# Patient Record
Sex: Male | Born: 1965
Health system: Southern US, Community
[De-identification: ages and names within clinical notes are randomized; demographics above are authoritative.]

## PROBLEM LIST (undated history)

## (undated) DIAGNOSIS — E785 Hyperlipidemia, unspecified: Secondary | ICD-10-CM

## (undated) DIAGNOSIS — Z803 Family history of malignant neoplasm of breast: Secondary | ICD-10-CM

## (undated) DIAGNOSIS — K219 Gastro-esophageal reflux disease without esophagitis: Secondary | ICD-10-CM

## (undated) DIAGNOSIS — E119 Type 2 diabetes mellitus without complications: Principal | ICD-10-CM

## (undated) DIAGNOSIS — I2510 Atherosclerotic heart disease of native coronary artery without angina pectoris: Secondary | ICD-10-CM

## (undated) DIAGNOSIS — C711 Malignant neoplasm of frontal lobe: Principal | ICD-10-CM

## (undated) DIAGNOSIS — I249 Acute ischemic heart disease, unspecified: Secondary | ICD-10-CM

## (undated) DIAGNOSIS — I4729 Other ventricular tachycardia: Secondary | ICD-10-CM

## (undated) DIAGNOSIS — I472 Ventricular tachycardia: Secondary | ICD-10-CM

## (undated) DIAGNOSIS — R0602 Shortness of breath: Secondary | ICD-10-CM

## (undated) DIAGNOSIS — R569 Unspecified convulsions: Secondary | ICD-10-CM

## (undated) HISTORY — DX: Other ventricular tachycardia: I47.29

## (undated) HISTORY — PX: CARDIAC CATHETERIZATION: SHX172

## (undated) HISTORY — DX: Ventricular tachycardia: I47.2

## (undated) HISTORY — DX: Acute ischemic heart disease, unspecified: I24.9

## (undated) HISTORY — DX: Hyperlipidemia, unspecified: E78.5

## (undated) HISTORY — PX: CORONARY ANGIOPLASTY WITH STENT PLACEMENT: SHX49

## (undated) HISTORY — DX: Malignant neoplasm of frontal lobe: C71.1

## (undated) HISTORY — PX: APPENDECTOMY: SHX54

## (undated) HISTORY — DX: Family history of malignant neoplasm of breast: Z80.3

## (undated) HISTORY — DX: Atherosclerotic heart disease of native coronary artery without angina pectoris: I25.10

## (undated) HISTORY — DX: Gastro-esophageal reflux disease without esophagitis: K21.9

## (undated) HISTORY — DX: Type 2 diabetes mellitus without complications: E11.9

---

## 2003-03-29 ENCOUNTER — Emergency Department (HOSPITAL_COMMUNITY): Admission: EM | Admit: 2003-03-29 | Discharge: 2003-03-29 | Payer: Self-pay | Admitting: Emergency Medicine

## 2005-09-06 ENCOUNTER — Emergency Department (HOSPITAL_COMMUNITY): Admission: EM | Admit: 2005-09-06 | Discharge: 2005-09-06 | Payer: Self-pay | Admitting: Emergency Medicine

## 2005-09-21 ENCOUNTER — Encounter: Admission: RE | Admit: 2005-09-21 | Discharge: 2005-09-21 | Payer: Self-pay | Admitting: Surgery

## 2006-07-13 ENCOUNTER — Ambulatory Visit (HOSPITAL_COMMUNITY): Admission: RE | Admit: 2006-07-13 | Discharge: 2006-07-13 | Payer: Self-pay | Admitting: Surgery

## 2011-07-18 ENCOUNTER — Emergency Department (HOSPITAL_COMMUNITY): Payer: Self-pay

## 2011-07-18 ENCOUNTER — Other Ambulatory Visit: Payer: Self-pay

## 2011-07-18 ENCOUNTER — Observation Stay (HOSPITAL_COMMUNITY)
Admission: EM | Admit: 2011-07-18 | Discharge: 2011-07-19 | Discharge status: Against Medical Advice | Payer: Self-pay | Source: Ambulatory Visit | Attending: Internal Medicine | Admitting: Internal Medicine

## 2011-07-18 ENCOUNTER — Encounter (HOSPITAL_COMMUNITY): Payer: Self-pay | Admitting: Emergency Medicine

## 2011-07-18 DIAGNOSIS — R079 Chest pain, unspecified: Principal | ICD-10-CM | POA: Insufficient documentation

## 2011-07-18 DIAGNOSIS — R0602 Shortness of breath: Secondary | ICD-10-CM | POA: Insufficient documentation

## 2011-07-18 DIAGNOSIS — F17200 Nicotine dependence, unspecified, uncomplicated: Secondary | ICD-10-CM | POA: Insufficient documentation

## 2011-07-18 DIAGNOSIS — E119 Type 2 diabetes mellitus without complications: Secondary | ICD-10-CM

## 2011-07-18 DIAGNOSIS — R109 Unspecified abdominal pain: Secondary | ICD-10-CM | POA: Insufficient documentation

## 2011-07-18 DIAGNOSIS — R739 Hyperglycemia, unspecified: Secondary | ICD-10-CM

## 2011-07-18 DIAGNOSIS — R1013 Epigastric pain: Secondary | ICD-10-CM

## 2011-07-18 HISTORY — DX: Shortness of breath: R06.02

## 2011-07-18 LAB — DIFFERENTIAL
Lymphocytes Relative: 30 % (ref 12–46)
Monocytes Relative: 8 % (ref 3–12)

## 2011-07-18 LAB — GLUCOSE, CAPILLARY: Glucose-Capillary: 276 mg/dL — ABNORMAL HIGH (ref 70–99)

## 2011-07-18 LAB — COMPREHENSIVE METABOLIC PANEL
ALT: 35 U/L (ref 0–53)
AST: 23 U/L (ref 0–37)
Alkaline Phosphatase: 111 U/L (ref 39–117)
Calcium: 9.1 mg/dL (ref 8.4–10.5)
GFR calc Af Amer: >90 mL/min (ref >90)
GFR calc non Af Amer: >90 mL/min (ref >90)
Glucose, Bld: 373 mg/dL — ABNORMAL HIGH (ref 70–99)
Total Bilirubin: 0.3 mg/dL (ref 0.3–1.2)
Total Protein: 7.4 g/dL (ref 6.0–8.3)

## 2011-07-18 LAB — CBC
MCH: 30.7 pg (ref 26.0–34.0)
MCHC: 35.4 g/dL (ref 30.0–36.0)
RBC: 5.6 MIL/uL (ref 4.22–5.81)

## 2011-07-18 LAB — LIPID PANEL
HDL: 22 mg/dL — ABNORMAL LOW (ref >39)
LDL Cholesterol: 107 mg/dL — ABNORMAL HIGH (ref 0–99)
Total CHOL/HDL Ratio: 8.8 RATIO
Triglycerides: 322 mg/dL — ABNORMAL HIGH (ref <150)
VLDL: 64 mg/dL — ABNORMAL HIGH (ref 0–40)

## 2011-07-18 LAB — URINALYSIS, ROUTINE W REFLEX MICROSCOPIC
Ketones, ur: NEGATIVE mg/dL
Nitrite: NEGATIVE
pH: 5.5 (ref 5.0–8.0)

## 2011-07-18 LAB — TROPONIN I: Troponin I: <0.3 ng/mL (ref <0.30)

## 2011-07-18 LAB — CARDIAC PANEL(CRET KIN+CKTOT+MB+TROPI)
CK, MB: 1.4 ng/mL (ref 0.3–4.0)
CK, MB: 1.6 ng/mL (ref 0.3–4.0)
Total CK: 67 U/L (ref 7–232)
Troponin I: <0.3 ng/mL (ref <0.30)

## 2011-07-18 LAB — URINE MICROSCOPIC-ADD ON

## 2011-07-18 LAB — LIPASE, BLOOD: Lipase: 51 U/L (ref 11–59)

## 2011-07-18 MED ORDER — SODIUM CHLORIDE 0.9 % IV BOLUS (SEPSIS)
1000.0000 mL | Freq: Once | INTRAVENOUS | Status: AC
  Administered 2011-07-18: 1000 mL via INTRAVENOUS

## 2011-07-18 MED ORDER — SODIUM CHLORIDE 0.9 % IV SOLN
INTRAVENOUS | Status: DC
  Administered 2011-07-18: 12:00:00 via INTRAVENOUS

## 2011-07-18 MED ORDER — PANTOPRAZOLE SODIUM 40 MG PO TBEC
40.0000 mg | DELAYED_RELEASE_TABLET | Freq: Every day | ORAL | Status: DC
  Administered 2011-07-18: 40 mg via ORAL
  Filled 2011-07-18: qty 1

## 2011-07-18 MED ORDER — ACETAMINOPHEN 650 MG RE SUPP: 650.0000 mg | Freq: Four times a day (QID) | RECTAL | Status: DC | PRN

## 2011-07-18 MED ORDER — ACETAMINOPHEN 325 MG PO TABS: 650.0000 mg | ORAL_TABLET | Freq: Four times a day (QID) | ORAL | Status: DC | PRN

## 2011-07-18 MED ORDER — ONDANSETRON HCL 4 MG/2ML IJ SOLN: 4.0000 mg | Freq: Four times a day (QID) | INTRAMUSCULAR | Status: DC | PRN

## 2011-07-18 MED ORDER — ENOXAPARIN SODIUM 40 MG/0.4ML ~~LOC~~ SOLN
40.0000 mg | SUBCUTANEOUS | Status: DC
  Administered 2011-07-18: 40 mg via SUBCUTANEOUS
  Filled 2011-07-18 (×2): qty 0.4

## 2011-07-18 MED ORDER — INSULIN ASPART 100 UNIT/ML ~~LOC~~ SOLN
0.0000 [IU] | Freq: Three times a day (TID) | SUBCUTANEOUS | Status: DC
  Administered 2011-07-18: 5 [IU] via SUBCUTANEOUS
  Administered 2011-07-19: 2 [IU] via SUBCUTANEOUS

## 2011-07-18 MED ORDER — ONDANSETRON HCL 4 MG PO TABS: 4.0000 mg | ORAL_TABLET | Freq: Four times a day (QID) | ORAL | Status: DC | PRN

## 2011-07-18 MED ORDER — GI COCKTAIL ~~LOC~~
30.0000 mL | Freq: Two times a day (BID) | ORAL | Status: DC | PRN
  Filled 2011-07-18: qty 30

## 2011-07-18 MED ORDER — INSULIN GLARGINE 100 UNIT/ML ~~LOC~~ SOLN
10.0000 [IU] | Freq: Every day | SUBCUTANEOUS | Status: DC
  Administered 2011-07-18: 10 [IU] via SUBCUTANEOUS

## 2011-07-18 MED ORDER — INSULIN ASPART 100 UNIT/ML ~~LOC~~ SOLN
0.0000 [IU] | Freq: Every day | SUBCUTANEOUS | Status: DC
  Administered 2011-07-18: 2 [IU] via SUBCUTANEOUS

## 2011-07-18 NOTE — ED Notes (Signed)
Pt. Stated, I've had chest pain for 2 days and my arm was tingling

## 2011-07-18 NOTE — ED Provider Notes (Signed)
History     CSN: [id number]  Arrival date & time 07/18/11  4098   First MD Initiated Contact with Patient 07/18/11 0802      Chief Complaint  Patient presents with  . Chest Pain    (Consider location/radiation/quality/duration/timing/severity/associated sxs/prior treatment) Patient is a 46 y.o. male presenting with chest pain. The history is provided by the patient.  Chest Pain Primary symptoms include abdominal pain. Pertinent negatives for primary symptoms include no shortness of breath, no nausea and no vomiting.  Pertinent negatives for associated symptoms include no numbness and no weakness.    patient states that he has had chest pain for couple days now. It is in his left lower chest. He states is worse when he lays over on that side. He also states his left arm will tingle. He smokes about a pack and half of cigarettes a day. He does not see doctors normally. He also states she's been urinating frequently for the last few months. He states he checked his sugar on a friend's meter and it was 400. He does not have a history of diabetes. He states he is thirsty all the time. No cough. No fevers. He has occasional epigastric pain. He does not have any chest pain at this time  History reviewed. No pertinent past medical history.  Past Surgical History  Procedure Date  . Appendectomy     History reviewed. No pertinent family history.  History  Substance Use Topics  . Smoking status: Current Everyday Smoker -- 1.5 packs/day  . Smokeless tobacco: Not on file  . Alcohol Use: No      Review of Systems  Constitutional: Negative for activity change and appetite change.  HENT: Negative for neck stiffness.   Eyes: Negative for pain.  Respiratory: Negative for chest tightness and shortness of breath.   Cardiovascular: Positive for chest pain. Negative for leg swelling.  Gastrointestinal: Positive for abdominal pain. Negative for nausea, vomiting and diarrhea.  Genitourinary:  Negative for flank pain.  Musculoskeletal: Negative for back pain.  Skin: Negative for rash.  Neurological: Negative for weakness, numbness and headaches.  Psychiatric/Behavioral: Negative for behavioral problems.    Allergies  Review of patient's allergies indicates not on file.  Home Medications  No current outpatient prescriptions on file.  BP 132/85  Pulse 75  Temp(Src) 97.5 F (36.4 C) (Oral)  Resp 16  Ht 5\' 11"  (1.803 m)  Wt 196 lb 14.4 oz (89.313 kg)  BMI 27.46 kg/m2  SpO2 98%  Physical Exam  Nursing note and vitals reviewed. Constitutional: He is oriented to person, place, and time. He appears well-developed and well-nourished.  HENT:  Head: Normocephalic and atraumatic.  Eyes: EOM are normal. Pupils are equal, round, and reactive to light.  Neck: Normal range of motion. Neck supple.  Cardiovascular: Normal rate, regular rhythm and normal heart sounds.   No murmur heard. Pulmonary/Chest: Effort normal and breath sounds normal.  Abdominal: Soft. Bowel sounds are normal. He exhibits no distension and no mass. There is no tenderness. There is no rebound and no guarding.  Musculoskeletal: Normal range of motion. He exhibits no edema.  Neurological: He is alert and oriented to person, place, and time. No cranial nerve deficit.  Skin: Skin is warm and dry.  Psychiatric: He has a normal mood and affect.    ED Course  Procedures (including critical care time)  Labs Reviewed  GLUCOSE, CAPILLARY - Abnormal; Notable for the following:    Glucose-Capillary 402 (*)  All other components within normal limits  CBC - Abnormal; Notable for the following:    Hemoglobin 17.2 (*)    All other components within normal limits  COMPREHENSIVE METABOLIC PANEL - Abnormal; Notable for the following:    Sodium 130 (*)    Glucose, Bld 373 (*)    All other components within normal limits  URINALYSIS, ROUTINE W REFLEX MICROSCOPIC - Abnormal; Notable for the following:    Specific  Gravity, Urine 1.040 (*)    Glucose, UA >1000 (*)    All other components within normal limits  GLUCOSE, CAPILLARY - Abnormal; Notable for the following:    Glucose-Capillary 276 (*)    All other components within normal limits  LIPID PANEL - Abnormal; Notable for the following:    Triglycerides 322 (*)    HDL 22 (*)    VLDL 64 (*)    LDL Cholesterol 107 (*)    All other components within normal limits  DIFFERENTIAL  LIPASE, BLOOD  TROPONIN I  URINE MICROSCOPIC-ADD ON  CARDIAC PANEL(CRET KIN+CKTOT+MB+TROPI)  D-DIMER, QUANTITATIVE  CARDIAC PANEL(CRET KIN+CKTOT+MB+TROPI)  TSH  HEMOGLOBIN A1C  MICROALBUMIN / CREATININE URINE RATIO  CARDIAC PANEL(CRET KIN+CKTOT+MB+TROPI)   Dg Chest 2 View  07/18/2011  *RADIOLOGY REPORT*  Clinical Data: Left chest pain, shortness of breath, hyperglycemia  CHEST - 2 VIEW  Comparison:  None.  Findings:  The heart size and mediastinal contours are within normal limits.  Both lungs are clear.  The visualized skeletal structures are unremarkable.  IMPRESSION: No active cardiopulmonary disease.  Original Report Authenticated By: Judie Petit. Ruel Favors, M.D.     1. Hyperglycemia   2. Chest pain     Date: 07/18/2011  Rate: 81  Rhythm: normal sinus rhythm  QRS Axis: normal  Intervals: normal  ST/T Wave abnormalities: normal  Conduction Disutrbances:none  Narrative Interpretation:   Old EKG Reviewed: none available     MDM  Patient has chest pain that comes and goes. EKG and laboratory reassuring for that. He also is a new apparent diabetes. A sugar is 400, but he does not appear to be in DKA. He does not have followup will be admitted to medicine.        Juliet Rude. Rubin Payor, MD 07/18/11 1505

## 2011-07-18 NOTE — H&P (Signed)
Hospital Admission Note Date: 07/18/2011  Patient name: Jonathan Barker Medical record number: [id number] Date of birth: November 13, 1965 Age: 46 y.o. Gender: male PCP: DEFAULT,PROVIDER, MD, MD  Medical Service:  Mervyn Gay  Attending physician:  Dr. Doneen Poisson   1st Contact:   Dr. Yaakov Guthrie  Pager:  [phone] 2nd Contact:   Dr. 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  The pain is in his central lower chest and is also over his epigastric area/left upper quadrant.  It is "a heaviness".  After he eats the pain is worse and has more of a pressure quality.  He had some left arm tingling today as well.  No nausea or vomiting.  No constipation or diarrhea.  No history of heartburn. He has noticed muscle wasting over the past 3 months.  He believes he is losing weight, but he has not weighed himself to know.  He has been notably fatigued for last couple months as well. He also reports wheezing/SOB when he lays on his left side.  He did have some palpitations yesterday.  His glucose was found to be 402 in the emergency department.  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  He has extensive family history of diabetes.  He has had polyuria for three months.  No numbness or tingling anywhere currently.  No headache.  No dysuria or hematuria.  No rashes.  Some lower leg cramping bilaterally that improves with moving feet and toes.  No erythema or swelling of legs.  No SOB except when laying on left side.     Meds: Medication List  As of 07/18/2011 11:20 AM   ASK your doctor about these medications        ibuprofen 200 MG tablet   Commonly known as: ADVIL,MOTRIN   Take 400 mg by mouth every 6 (six) hours as needed. For pain          Rarely takes ibuprofen, but took it 5 days straight a couple weeks ago for toothache  Allergies: Review of patient's allergies indicates not on file.  Past Medical History: None reported by patient  Past Surgical History  Procedure Date  . Appendectomy   Inguinal hernia surgeries bilaterally, likely umbilical hernia surgery as well  Family History: Father had MI and CABG between ages 74 and 84. Father, brother, uncles all have diabetes. Mother had cancer in her 83s.  History   Social History  . Marital Status: Significant Other    Spouse Name: N/A    Number of Children: N/A  . Years of Education: N/A   Occupational History  . Not on file.   Social History Main Topics  . Smoking status: Current Everyday Smoker -- 1.5 packs/day  . Smokeless tobacco: Not on file  . Alcohol Use: No  . Drug Use: Yes    Special: Marijuana  . Sexually Active:   Has smoked for 30 years Daily marijuana use Currently is a truck driver.  Review of Systems: See HPI  Physical Exam: Blood pressure 116/83, pulse 69,  temperature 97.6 F (36.4 C), temperature source Oral, resp. rate 14, SpO2 99.00%.  General: alert, well-developed, and cooperative to examination.  Head: normocephalic and atraumatic.  Eyes: vision grossly intact, pupils equal, pupils round, pupils reactive to light, no injection and anicteric.  Mouth: pharynx pink and moist, no erythema, and no exudates.  Neck: no JVD, and no carotid bruits.  Lungs: normal respiratory effort, no accessory muscle use, normal breath sounds, no crackles, and no wheezes. Heart: normal rate, regular rhythm, no murmur, no gallop, and no rub.  No chest wall tenderness  Abdomen: Tender to palpation in epigastric area and LUQ.  Soft, normal bowel sounds, no distention, no guarding, no rebound tenderness.  Msk: no  joint swelling, no joint warmth, and no redness over joints.  Pulses: 2+ DP/PT pulses bilaterally Extremities: No cyanosis, clubbing, edema.  No erythema, swelling, or tenderness of lower extremities.   Neurologic: alert & oriented X3, cranial nerves II-XII intact, strength normal in all extremities, sensation intact to light touch. Skin: turgor normal and no rashes.    Lab results: Basic Metabolic Panel:  Basename 07/18/11 0816  NA 130*  K 4.2  CL 98  CO2 21  GLUCOSE 373*  BUN 14  CREATININE 0.67  CALCIUM 9.1  MG --  PHOS --   Liver Function Tests:  [facility] 07/18/11 0816  AST 23  ALT 35  ALKPHOS 111  BILITOT 0.3  PROT 7.4  ALBUMIN 3.7    Basename 07/18/11 0816  LIPASE 51  AMYLASE --   CBC:  Basename 07/18/11 0816  WBC 10.0  NEUTROABS 5.8  HGB 17.2*  HCT 48.6  MCV 86.8  PLT 238   Cardiac Enzymes:  Basename 07/18/11 0816  CKTOTAL --  CKMB --  CKMBINDEX --  TROPONINI <0.30   CBG:  Basename 07/18/11 1009 07/18/11 0802  GLUCAP 276* 402*   Urinalysis:  Basename 07/18/11 0819  COLORURINE YELLOW  LABSPEC 1.040*  PHURINE 5.5  GLUCOSEU >1000*  HGBUR NEGATIVE  BILIRUBINUR NEGATIVE  KETONESUR NEGATIVE  PROTEINUR NEGATIVE  UROBILINOGEN 0.2  NITRITE NEGATIVE  LEUKOCYTESUR NEGATIVE    Imaging results:  Dg Chest 2 View  07/18/2011  *RADIOLOGY REPORT*  Clinical Data: Left chest pain, shortness of breath, hyperglycemia  CHEST - 2 VIEW  Comparison:  None.  Findings:  The heart size and mediastinal contours are within normal limits.  Both lungs are clear.  The visualized skeletal structures are unremarkable.  IMPRESSION: No active cardiopulmonary disease.  Original Report Authenticated By: Judie Petit. Ruel Favors, M.D.    Other results: EKG: Regular rate, normal sinus rhythm.  Normal axis.  Normal EKG.  No priors available.  Assessment & Plan by Problem:  Epigastric/Chest Pain: Patient presents with epigastric/lower chest pain for two days.  Resolved at  this time.  Worse after eating, and some wheezing/SOB laying on left side.  GERD and PUD are highest on the differential.  Given his possible recent weight loss, more serious GI etiologies/further workup may need to be considered if he has no response to PPI and GI cocktail if his pain recurs.  Given normal EKG, troponin negative x 1, and pain being located more epigastrically, ACS unlikely but will admit for ACS rule out since he has diabetes and has not seen a physician in a long time.  PE is a less likely possibility as he has no tachycardia or tachypnea.  However, he did have palpitations yesterday, he drives trucks long distances, and his mother had a blood clot, so will  check D-Dimer for this low risk patient.  Modified Geneva Score 3, low risk.    -Cardiac Enzymes x 3 sets -D dimer to rule out PE in this low risk patient.  Modified Geneva Score  -Lipid Panel -Protonix -GI cocktail PRN if chest pain recurs  Diabetes Mellitus: CBG 402 on arrival to ED with polyuria for past few months.  Extensive family history of diabetes.  New diagnosis of diabetes.   -Will check HbA1c, Lipid Panel, urine microalbumin/creatinine -Carb modified diet -SSI sensitive  Muscle Wasting and Fatigue: For past 2-3 months.  This may all be due to his likely uncontrolled diabetes mellitus.   He is a long term smoker, but no cough and CXRay is clear.  Hgb is 17, so not anemic.  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   -Work on improving diet, treat his diabetes, and observe for now.    -Will check TSH as well  DVT Prophylaxis: Lovenox   SignedYaakov Guthrie, BRAD 07/18/2011, 11:16 AM

## 2011-07-18 NOTE — ED Notes (Signed)
Lunch tray ordered 

## 2011-07-18 NOTE — ED Notes (Signed)
CBG 402 

## 2011-07-19 ENCOUNTER — Encounter (HOSPITAL_COMMUNITY): Payer: Self-pay | Admitting: *Deleted

## 2011-07-19 LAB — CARDIAC PANEL(CRET KIN+CKTOT+MB+TROPI)
CK, MB: 1.6 ng/mL (ref 0.3–4.0)
Relative Index: INVALID (ref 0.0–2.5)
Total CK: 78 U/L (ref 7–232)
Troponin I: <0.3 ng/mL (ref <0.30)

## 2011-07-19 LAB — MICROALBUMIN / CREATININE URINE RATIO
Creatinine, Urine: 78.2 mg/dL
Microalb Creat Ratio: 6.4 mg/g (ref 0.0–30.0)

## 2011-07-19 LAB — BASIC METABOLIC PANEL
BUN: 10 mg/dL (ref 6–23)
CO2: 22 mEq/L (ref 19–32)
Calcium: 8.6 mg/dL (ref 8.4–10.5)
GFR calc non Af Amer: >90 mL/min (ref >90)
Glucose, Bld: 205 mg/dL — ABNORMAL HIGH (ref 70–99)

## 2011-07-19 MED ORDER — PRAVASTATIN SODIUM 40 MG PO TABS: 40.0000 mg | ORAL_TABLET | Freq: Every day | ORAL | Status: AC

## 2011-07-19 MED ORDER — ASPIRIN EC 81 MG PO TBEC: 81.0000 mg | DELAYED_RELEASE_TABLET | Freq: Every day | ORAL | Status: AC

## 2011-07-19 MED ORDER — LIVING WELL WITH DIABETES BOOK
Freq: Once | Status: DC
  Filled 2011-07-19: qty 1

## 2011-07-19 MED ORDER — OMEPRAZOLE 20 MG PO CPDR: 20.0000 mg | DELAYED_RELEASE_CAPSULE | Freq: Every day | ORAL | Status: DC

## 2011-07-19 MED ORDER — METFORMIN HCL 500 MG PO TABS: ORAL_TABLET | ORAL | Status: DC

## 2011-07-19 MED ORDER — ACETAMINOPHEN 325 MG PO TABS: 650.0000 mg | ORAL_TABLET | Freq: Four times a day (QID) | ORAL | Status: DC | PRN

## 2011-07-19 NOTE — Progress Notes (Signed)
Utilization review complete 

## 2011-07-19 NOTE — Progress Notes (Signed)
Received referral for assistance with medications. Will follow for progression and any d/c needs. Pt would be eligible for some medication assistance if needed, will talk with pt . Johny Shock RN MPH Case Manager [phone]

## 2011-07-19 NOTE — H&P (Signed)
Internal Medicine Attending Admission Note Date: 07/19/2011  Patient name: Jonathan Barker Medical record number: [id number] Date of birth: 1965-12-19 Age: 46 y.o. Gender: male  I saw and evaluated the patient. I reviewed the resident's note and I agree with the resident's findings and plan as documented in the resident's note.  Chief Complaint(s):  Chest pain.  History - key components related to admission:  Mr. Diamant is a 46 year old man who presents with a two-day history of central chest heaviness that radiates to the left lower ribs and left upper quadrant of the abdomen. The heaviness is not associated with shortness of breath, nausea, diaphoresis, or dizziness. There was some associated left arm tingling. The pain was worse with eating but there were no alleviating factors. He denied any fevers, shakes, chills, vomiting, or diarrhea. When he laid on his left side he noticed some wheezing. This pain concerned him so he presented to the emergency department for further evaluation.  Cardiac risk factors include tobacco abuse, diabetes, hyperlipidemia, and premature coronary artery disease.  Approximately 9 months ago Mr. 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 During that exam he was noted to have protein in his urine and a blood sugar of 180. Nothing further was done at that time. Over the last 3 months he has noted polyuria and polydipsia. His friends have glucometers and he has been periodically checking his blood sugars over the last 2-3 days and they have been in the 200-400 range. He has an extensive family history of diabetes.  He currently feels well and denies any chest pain.  Physical Exam - key components related to admission:  Filed Vitals:   07/18/11 1307 07/18/11 1430 07/18/11 2100 07/19/11 0600  BP: 120/81 132/85 127/81 119/76  Pulse: 71 75 74 83  Temp:  97.5 F (36.4 C) 97.8 F (36.6 C) 97.4 F (36.3 C)  TempSrc:  Oral    Resp: 15 16 18 18     Height:  5\' 11"  (1.803 m)    Weight:  196 lb 14.4 oz (89.313 kg)    SpO2: 100% 98% 100% 97%   General: Well-developed, well-nourished, man lying comfortably in bed in no acute distress. Lungs: Clear to auscultation bilaterally without wheezes, rhonchi, or rales. Heart: Regular rate and rhythm without murmurs, rubs, or gallops. Abdomen: Soft, non tender, active bowel sounds without masses. Extremities: Without edema.  Lab results:  Basic Metabolic Panel:  Basename 07/19/11 0550 07/18/11 0816  NA 136 130*  K 4.0 4.2  CL 106 98  CO2 22 21  GLUCOSE 205* 373*  BUN 10 14  CREATININE 0.65 0.67  CALCIUM 8.6 9.1  MG -- --  PHOS -- --   Liver Function Tests:  Basename 07/18/11 0816  AST 23  ALT 35  ALKPHOS 111  BILITOT 0.3  PROT 7.4  ALBUMIN 3.7    Basename 07/18/11 0816  LIPASE 51  AMYLASE --   CBC:  Basename 07/18/11 0816  WBC 10.0  NEUTROABS 5.8  HGB 17.2*  HCT 48.6  MCV 86.8  PLT 238   Cardiac Enzymes:  Basename 07/19/11 0239 07/18/11 2053 07/18/11 1340  CKTOTAL 78 87 67  CKMB 1.6 1.6 1.4  CKMBINDEX -- -- --  TROPONINI <0.30 <0.30 <0.30   D-Dimer:  Basename 07/18/11 1340  DDIMER <0.22   CBG:  Basename 07/19/11 0741 07/18/11 2133 07/18/11 1504 07/18/11 1009 07/18/11 0802  GLUCAP 185* 207* 257* 276* 402*   Hemoglobin A1C:  Basename 07/18/11  1542  HGBA1C 12.4*   Fasting Lipid Panel:  Basename 07/18/11 1340  CHOL 193  HDL 22*  LDLCALC 107*  TRIG 322*  CHOLHDL 8.8  LDLDIRECT --   Thyroid Function Tests:  Basename 07/18/11 1340  TSH 1.073  T4TOTAL --  FREET4 --  T3FREE --  THYROIDAB --   Urinalysis:  Negative except for greater than 1000 glucose.  Imaging results:  Dg Chest 2 View  07/18/2011  *RADIOLOGY REPORT*  Clinical Data: Left chest pain, shortness of breath, hyperglycemia  CHEST - 2 VIEW  Comparison:  None.  Findings:  The heart size and mediastinal contours are within normal limits.  Both lungs are clear.  The visualized  skeletal structures are unremarkable.  IMPRESSION: No active cardiopulmonary disease.  Original Report Authenticated By: Judie Petit. Ruel Favors, M.D.   Other results:  EKG: normal sinus rhythm at 81 beats per minute. Normal axis and intervals, no significant Q waves or LVH. Good R-wave progression, no ST or T wave changes. No comparisons available.  Assessment & Plan by Problem:  Mr. Pardini is a 46 year old man who presents with atypical chest pain radiating to the left upper quadrant and epigastrium. The differential is broad and includes gastroesophageal reflux disease and coronary artery disease. Given the atypical nature of his symptoms we are currently leaning towards a GI cause. Nonetheless, he has extensive risk factors for coronary artery disease and this must be In mind. He has ruled out for myocardial infarction during this admission.  He also has newly diagnosed diabetes. This has likely been present for at least 9 months if not longer. His diet is very poor and includes over 2 L of [facility] as well as a quarter gallon of ice cream every day.  1) Chest pain: We will place Mr. Stalvey on a PPI with the presumption that this pain is related to reflux disease or peptic ulcer disease given its association and worsening with eating. If the pain recurs in the future we will then consider further evaluation for cardiac disease with an exercise EKG to risk stratify. Given his multiple risk factors we will start aspirin 81 mg by mouth daily.  2) Diabetes: Mr. Bissonette was counseled on the need to stop the [facility] and ice cream. We felt this would be a good start for him and something that he could achieve with minimal difficulty. With these 2 factors eliminated we anticipate his glucose control will be much better. In addition, we will start metformin at a low dose of 500 mg daily. We will titrate this dose up by 500 mg every week until he reaches 2 g per day. It is hoped that with a slow  titration he will tolerate it well from a GI standpoint. Further adjustments in his regimen can be made at that time after we assess the effects of the metformin and the dietary changes. While he is an inpatient we will get diabetes education to him prior to his discharge.  3) Disposition: We will plug [facility] primary care and he will be seen within one week to assess the effects of the PPI on his pain as well as the dietary changes and low-dose metformin on his blood sugars.  We would anticipate continued suboptimal control initially until he is able to titrate up the metformin. I agree with the housestaff's plan to discharge Mr. Petkus home today with the above follow up.

## 2011-07-19 NOTE — Progress Notes (Signed)
Patient admitted with CP.  Diagnosed with diabetes this admission.  A1C 12.4% (07/18/11).  Patient does not have health insurance coverage and is currently not working.  Received call from Resident 0930 am asking me to please come speak to patient about his new onset diabetes.  Resident told me they will be sending patient home on Metformin and diet therapy alone.  Patient is to follow up with the Internal Medicine Clinic at [facility] after d/c.  Resident to make sure patient follows up with Gavin Pound (Certified Diabetes Educator) in the clinic after d/c as well.  Spoke with pt about new diagnosis.  Discussed A1C results with him and explained what an A1C is, basic pathophysiology of DM Type 2, basic home care, importance of checking CBGs and maintaining good CBG control to prevent long-term and short-term complications.  Reviewed signs and symptoms of hyperglycemia and hypoglycemia.  Also reviewed Metformin therapy with this patient and provided basic information on carbohydrate counting and the importance of limiting his ice cream consumption to one serving and the importance of excluding sweetened drinks (like [facility] Dew/sweet tea/Regular soda/juice).  Patient told me he plans to stop drinking [facility] completely.  Educated patient on where to get an affordable CBG meter OTC at [employer].  Meter can be purchased for $16 and a box of 50 strips for $9.      RNs to provide ongoing basic DM education at bedside with this patient.  Have ordered Living Well with Diabetes educational booklet for patient.  Have ordered RD consult for extensive diabetes diet education, however, patient told me he was going to leave at 11am b/c his daughter was coming to pick him up.  Patient does not have d/c orders from the Attending MD as of right now.  RN told me patient is planning to leave AMA today.  Paged Resident at 1105am.  Still had not received call back by 11:17am.  Will ask RN to please page MD again to alert  them that patient plans to leave AMA today.  Ambrose Finland RN, MSN, CDE Diabetes Coordinator Inpatient Diabetes Program [phone]

## 2011-07-19 NOTE — Progress Notes (Addendum)
Subjective: Feeling well.  No CP or stomach pain since yesterday.  No events overnight.   Objective: Vital signs in last 24 hours: Filed Vitals:   07/18/11 1307 07/18/11 1430 07/18/11 2100 07/19/11 0600  BP: 120/81 132/85 127/81 119/76  Pulse: 71 75 74 83  Temp:  97.5 F (36.4 C) 97.8 F (36.6 C) 97.4 F (36.3 C)  TempSrc:  Oral    Resp: 15 16 18 18   Height:  5\' 11"  (1.803 m)    Weight:  196 lb 14.4 oz (89.313 kg)    SpO2: 100% 98% 100% 97%   Physical Exam:  General: alert, well-developed, and cooperative to examination.  Head: normocephalic and atraumatic.  Eyes: vision grossly intact, pupils equal, pupils round, pupils reactive to light, no injection and anicteric.  Mouth: pharynx pink and moist, no erythema, and no exudates.  Neck: no JVD Lungs: normal respiratory effort, no accessory muscle use, normal breath sounds, no crackles, and no wheezes. Heart: normal rate, regular rhythm, no murmur, no gallop, and no rub. No chest wall tenderness.  Abdomen: Abdomen: Tender to palpation in epigastric area and LUQ. Soft, normal bowel sounds, no distention, no guarding, no rebound tenderness.  Somewhat less tender than yesterday.  Pulses: 2+ DP/PT pulses bilaterally Extremities: No cyanosis, clubbing, edema Neurologic: alert & oriented X3, cranial nerves II-XII intact, strength normal in all extremities, sensation intact to light touch Skin: turgor normal and no rashes.    Lab Results: Basic Metabolic Panel:  Lab 07/19/11 1610 07/18/11 0816  NA 136 130*  K 4.0 4.2  CL 106 98  CO2 22 21  GLUCOSE 205* 373*  BUN 10 14  CREATININE 0.65 0.67  CALCIUM 8.6 9.1  MG -- --  PHOS -- --   Liver Function Tests:  Lab 07/18/11 0816  AST 23  ALT 35  ALKPHOS 111  BILITOT 0.3  PROT 7.4  ALBUMIN 3.7    Lab 07/18/11 0816  LIPASE 51  AMYLASE --   CBC:  Lab 07/18/11 0816  WBC 10.0  NEUTROABS 5.8  HGB 17.2*  HCT 48.6  MCV 86.8  PLT 238   Cardiac Enzymes:  Lab  07/19/11 0239 07/18/11 2053 07/18/11 1340  CKTOTAL 78 87 67  CKMB 1.6 1.6 1.4  CKMBINDEX -- -- --  TROPONINI <0.30 <0.30 <0.30   D-Dimer:  Lab 07/18/11 1340  DDIMER <0.22   CBG:  Lab 07/19/11 0741 07/18/11 2133 07/18/11 1504 07/18/11 1009 07/18/11 0802  GLUCAP 185* 207* 257* 276* 402*   Hemoglobin A1C:  Lab 07/18/11 1542  HGBA1C 12.4*   Fasting Lipid Panel:  Lab 07/18/11 1340  CHOL 193  HDL 22*  LDLCALC 107*  TRIG 322*  CHOLHDL 8.8  LDLDIRECT --   Thyroid Function Tests:  Lab 07/18/11 1340  TSH 1.073  T4TOTAL --  FREET4 --  T3FREE --  THYROIDAB --   Urinalysis:  Lab 07/18/11 0819  COLORURINE YELLOW  LABSPEC 1.040*  PHURINE 5.5  GLUCOSEU >1000*  HGBUR NEGATIVE  BILIRUBINUR NEGATIVE  KETONESUR NEGATIVE  PROTEINUR NEGATIVE  UROBILINOGEN 0.2  NITRITE NEGATIVE  LEUKOCYTESUR NEGATIVE   Studies/Results: Dg Chest 2 View  07/18/2011  *RADIOLOGY REPORT*  Clinical Data: Left chest pain, shortness of breath, hyperglycemia  CHEST - 2 VIEW  Comparison:  None.  Findings:  The heart size and mediastinal contours are within normal limits.  Both lungs are clear.  The visualized skeletal structures are unremarkable.  IMPRESSION: No active cardiopulmonary disease.  Original Report Authenticated By: Judie Petit. [facility]  Miles Costain, M.D.   Medications: I have reviewed the patient's current medications. Scheduled Meds:   . enoxaparin  40 mg Subcutaneous Q24H  . insulin aspart  0-5 Units Subcutaneous QHS  . insulin aspart  0-9 Units Subcutaneous TID WC  . insulin glargine  10 Units Subcutaneous QHS  . living well with diabetes book   Does not apply Once  . pantoprazole  40 mg Oral Q1200   Continuous Infusions:   . sodium chloride 125 mL/hr at 07/18/11 1900   PRN Meds:.acetaminophen, acetaminophen, gi cocktail, ondansetron (ZOFRAN) IV, ondansetron  Assessment/Plan:  Epigastric/Chest Pain: Patient presents with epigastric/lower chest pain for two days. Resolved at this time. Was  worse after eating, and some wheezing/SOB laying on left side. GERD and PUD are highest on the differential.  Plan is to start PPI and see if this improves his symptoms.  If CP/epigastric pain persist, further outpatient workup may be indicated. ACS has been ruled out with three sets of negative cardiac enzymes. PE ruled out as his pretest prob is low and d-dimer negative.   -Discharge today once diabetes educator, dietician, case manager speak with him.  Discharge medications will include metformin, pravastatin, omeprazole, baby aspirin.  Diabetes Mellitus: CBG 402 on arrival to ED with polyuria for past few months. Extensive family history of diabetes. New diagnosis of diabetes. HbA1c 12.4%.  Urine Microalbumin/Cr normal.  Plan is to start metformin on discharge.  Diet is a big importance here since he eats a lot of ice cream each night and drinks tons of [facility].  Goals for now are to cut out [facility] and Ice Cream.  Diabetic educator to see him here and in our clinic.  FU in our clinic for diabetes care and primary care.  Lipids not quite at goal- given his family history will start pravastatin at discharge as well.    Muscle Wasting and Fatigue: For past 2-3 months. This may all be due to his likely uncontrolled diabetes mellitus. He is a long term smoker, but no cough and CXRay is clear. Hgb is 17, so not anemic. 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 TSH wnl.  Work on [employer], treat his diabetes, and observe for now.   DVT Prophylaxis: Lovenox     LOS: 1 day   [facility], BRAD 07/19/2011, 10:35 AM

## 2011-07-20 NOTE — Discharge Summary (Signed)
Internal Medicine Teaching [facility] Discharge Note  Name: Jonathan Barker MRN: [id number] DOB: [birth year] 46 y.o.  Date of Admission: 07/18/2011  7:45 AM Date of Discharge: 07/19/2011 Attending Physician: Doneen Poisson  Discharge Diagnosis:  Epigastric/Chest Pain Diabetes Mellitus Muscle Wasting/Fatigue  Discharge Medications: Medication List  As of 07/20/2011  9:00 AM   STOP taking these medications         ibuprofen 200 MG tablet         TAKE these medications         acetaminophen 325 MG tablet   Commonly known as: TYLENOL   Take 2 tablets (650 mg total) by mouth every 6 (six) hours as needed for pain.      aspirin EC 81 MG tablet   Take 1 tablet (81 mg total) by mouth daily.      metFORMIN 500 MG tablet   Commonly known as: GLUCOPHAGE   Take one tablet by mouth daily with breakfast for one week,  then take one tablet with breakfast and one tablet with dinner each day for one week,  Then take two tablets with breakfast and one tablet with dinner each day for one week,  Then take two tablets with breakfast and two tablets with dinner each day thereafter      omeprazole 20 MG capsule   Commonly known as: PRILOSEC   Take 1 capsule (20 mg total) by mouth daily.      pravastatin 40 MG tablet   Commonly known as: PRAVACHOL   Take 1 tablet (40 mg total) by mouth daily.            Disposition and follow-up:   [facility] was discharged from [facility] in Stable condition.  He rushed out of the [facility] before receiving prescriptions for his medications.  Prescriptions were left with nursing who were attempting to get them to patient's daughter who was supposed to be coming back to the floor.  At [facility] follow-up please assess whether patient has been taking meds, especially metformin and omeprazole which were both started this admission.    Follow-up Appointments: Follow-up Information    Follow up with [facility], MD on  08/09/2011. (2:15pm  This is the doctor.  Clinic is on the ground floor on [facility])    Contact information:   [address] [place] [address] [phone]       Follow up with [facility], [facility], RD on 08/09/2011. (3:30pm  This is the diabetes educator)    Contact information:   [address] [place] [address] [phone]         Discharge Orders    Future Appointments: Provider: Department: Dept Phone: [facility]:   08/09/2011 2:15 PM Jonathan Beckers, MD [facility] [phone] [facility]   08/09/2011 3:30 PM [facility], RD [facility] [phone] [facility]     Future Orders Please Complete By Expires   Diet Carb Modified      Comments:   Please stop drinking mountain dew and stop eating ice cream.   Activity as tolerated - No restrictions      Call MD for:  severe uncontrolled pain      Call MD for:  persistant nausea and vomiting         Procedures Performed:  Dg Chest 2 View  07/18/2011  *RADIOLOGY REPORT*  Clinical Data: Left chest pain, shortness of breath, hyperglycemia  CHEST - 2 VIEW  Comparison:  None.  Findings:  The heart size and mediastinal contours are within normal limits.  Both lungs are clear.  The visualized skeletal structures are unremarkable.  IMPRESSION: No active cardiopulmonary disease.  Original Report Authenticated By: [facility], M.D.    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 The pain is in his central lower chest and is also over his epigastric area/left upper quadrant. It is "a heaviness". After he eats the pain is worse and has more of a pressure quality. He had some left arm tingling today as well. No nausea or vomiting. No constipation or diarrhea. No history of heartburn. He has noticed muscle wasting over the past 3 months. He believes he is  losing weight, but he has not weighed himself to know. He has been notably fatigued for last couple months as well. He also reports wheezing/SOB when he lays on his left side. He did have some palpitations yesterday.  His glucose was found to be 402 in the emergency department. 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 He has extensive family history of diabetes. He has had polyuria for three months.  No numbness or tingling anywhere currently. No headache. No dysuria or hematuria. No rashes. Some lower leg cramping bilaterally that improves with moving feet and toes. No erythema or swelling of legs. No SOB except when laying on left side.   [facility] Course by problem list:  Epigastric/Chest Pain: Admitted for two days of RUQ/epigastric abdominal and lower central chest pain.  Resolved on admission and did not recur.  Pain is worse after eating and he reported some SOB laying on his left side.  GERD was most likely etiology.  If pain recurs PUD should be considered.  Cardiac enzymes negative x 3 sets, and EKG normal.  Modified Geneva Score 3 low risk, and D-dimer negative, so PE was ruled out.  Patient was started on PPI and baby aspirin on discharge.   Diabetes Mellitus: New diagnosis this admission.  Glucose 402 on arrival, HbA1c 12.4%.  Patient reported drinking countless regular [employer] sodas per day and a quart of ice cream each night.  Diabetic educator spoke with patient about importance of improved diet.  Per my attending's discussion with patient, goal for now is for patient to stop [facility] entirely and cut back on ice cream as much as possible.  Patient started on Metformin alone for now, will follow-up in outpatient clinic to see MD and diabetic educator for management of his new diabetes. He will be a new patient to the clinic.  He reported 2-3 months of fatigue, polyuria, and muscle wasting before  presentation.  These were thought to be due to his uncontrolled DM.    Pravastatin started as well for lipid control in this diabetic patient with LDL 107.    Discharge Vitals:  BP 119/76  Pulse 83  Temp(Src) 97.4 F (36.3 C) (Oral)  Resp 18  Ht 5\' 11"  (1.803 m)  Wt 196 lb 14.4 oz (89.313 kg)  BMI 27.46 kg/m2  SpO2 97%  Discharge Labs:   Admission on 07/18/2011, Discharged on 07/19/2011  Component Date Value Range Status  . Glucose-Capillary (mg/dL) 40/98/1191 478* 29-56 Final  . WBC (K/uL) 07/18/2011 10.0  4.0-10.5 Final  . RBC (MIL/uL) 07/18/2011 5.60  4.22-5.81 Final  . Hemoglobin (g/dL) 40/98/1191 47.8* 29.5-62.1 Final  . HCT (%) 07/18/2011 48.6  39.0-52.0 Final  . MCV (fL) 07/18/2011 86.8  78.0-100.0 Final  . MCH (pg) 07/18/2011 30.7  26.0-34.0 Final  . MCHC (g/dL) 30/86/5784 69.6  29.5-28.4 Final  . RDW (%) 07/18/2011 11.9  11.5-15.5 Final  . Platelets (K/uL) 07/18/2011 238  150-400 Final  . Neutrophils Relative (%) 07/18/2011 58  43-77 Final  . Neutro Abs (K/uL) 07/18/2011 5.8  1.7-7.7 Final  . Lymphocytes Relative (%) 07/18/2011 30  12-46 Final  . Lymphs Abs (K/uL) 07/18/2011 3.0  0.7-4.0 Final  . Monocytes Relative (%) 07/18/2011 8  3-12 Final  . Monocytes Absolute (K/uL) 07/18/2011 0.8  0.1-1.0 Final  . Eosinophils Relative (%) 07/18/2011 3  0-5 Final  . Eosinophils Absolute (K/uL) 07/18/2011 0.3  0.0-0.7 Final  . Basophils Relative (%) 07/18/2011 1  0-1 Final  . Basophils Absolute (K/uL) 07/18/2011 0.1  0.0-0.1 Final  . Sodium (mEq/L) 07/18/2011 130* 135-145 Final  . Potassium (mEq/L) 07/18/2011 4.2  3.5-5.1 Final  . Chloride (mEq/L) 07/18/2011 98  96-112 Final  . CO2 (mEq/L) 07/18/2011 21  19-32 Final  . Glucose, Bld (mg/dL) 13/24/4010 272* 53-66 Final  . BUN (mg/dL) 44/07/4740 14  5-95 Final  . Creatinine, Ser (mg/dL) 63/87/5643 3.29  5.18-8.41 Final  . Calcium (mg/dL) 66/10/3014 9.1  0.1-09.3 Final  . Total Protein (g/dL) 23/55/7322 7.4  0.2-5.4 Final  .  Albumin (g/dL) 27/10/2374 3.7  2.8-3.1 Final  . AST (U/L) 07/18/2011 23  0-37 Final   HEMOLYSIS AT THIS LEVEL MAY AFFECT RESULT  . ALT (U/L) 07/18/2011 35  0-53 Final  . Alkaline Phosphatase (U/L) 07/18/2011 111  39-117 Final  . Total Bilirubin (mg/dL) 51/76/1607 0.3  3.7-1.0 Final  . GFR calc non Af Amer (mL/min) 07/18/2011 >90  >90 Final  . GFR calc Af Amer (mL/min) 07/18/2011 >90  >90 Final   Comment:                                 The eGFR has been calculated                          using the CKD EPI equation.                          This calculation has not been                          validated in all clinical                          situations.                          eGFR's persistently                          <90 mL/min signify                          possible Chronic Kidney Disease.  . Lipase (U/L) 07/18/2011 51  11-59 Final  . Color, Urine  07/18/2011 YELLOW  YELLOW Final  . APPearance  07/18/2011  CLEAR  CLEAR Final  . Specific Gravity, Urine  07/18/2011 1.040* 1.005-1.030 Final  . pH  07/18/2011 5.5  5.0-8.0 Final  . Glucose, UA (mg/dL) 16/02/9603 >5409* NEGATIVE Final  . Hgb urine dipstick  07/18/2011 NEGATIVE  NEGATIVE Final  . Bilirubin Urine  07/18/2011 NEGATIVE  NEGATIVE Final  . Ketones, ur (mg/dL) 81/19/1478 NEGATIVE  NEGATIVE Final  . Protein, ur (mg/dL) 29/56/2130 NEGATIVE  NEGATIVE Final  . Urobilinogen, UA (mg/dL) 86/57/8469 0.2  6.2-9.5 Final  . Nitrite  07/18/2011 NEGATIVE  NEGATIVE Final  . Leukocytes, UA  07/18/2011 NEGATIVE  NEGATIVE Final  . Troponin I (ng/mL) 07/18/2011 <0.30  <0.30 Final   Comment:                                 Due to the release kinetics of cTnI,                          a negative result within the first hours                          of the onset of symptoms does not rule out                          myocardial infarction with certainty.                          If myocardial infarction is still suspected,                           repeat the test at appropriate intervals.  . WBC, UA (WBC/hpf) 07/18/2011 0-2  <3 Final  . Glucose-Capillary (mg/dL) 28/41/3244 010* 27-25 Final  . Comment 1  07/18/2011 Documented in Chart   Final  . Comment 2  07/18/2011 Notify RN   Final  . Total CK (U/L) 07/18/2011 67  7-232 Final  . CK, MB (ng/mL) 07/18/2011 1.4  0.3-4.0 Final  . Troponin I (ng/mL) 07/18/2011 <0.30  <0.30 Final   Comment:                                 Due to the release kinetics of cTnI,                          a negative result within the first hours                          of the onset of symptoms does not rule out                          myocardial infarction with certainty.                          If myocardial infarction is still suspected,                          repeat the test at appropriate intervals.  . Relative Index  07/18/2011 RELATIVE INDEX IS INVALID  0.0-2.5 Final   Comment: WHEN CK <  100 U/L                                  . Total CK (U/L) 07/18/2011 87  7-232 Final  . CK, MB (ng/mL) 07/18/2011 1.6  0.3-4.0 Final  . Troponin I (ng/mL) 07/18/2011 <0.30  <0.30 Final   Comment:                                 Due to the release kinetics of cTnI,                          a negative result within the first hours                          of the onset of symptoms does not rule out                          myocardial infarction with certainty.                          If myocardial infarction is still suspected,                          repeat the test at appropriate intervals.  . Relative Index  07/18/2011 RELATIVE INDEX IS INVALID  0.0-2.5 Final   Comment: WHEN CK < 100 U/L                                  . Cholesterol (mg/dL) 40/98/1191 478  2-956 Final  . Triglycerides (mg/dL) 21/30/8657 846* <962 Final  . HDL (mg/dL) 95/28/4132 22* >44 Final  . Total CHOL/HDL Ratio (RATIO) 07/18/2011 8.8   Final  . VLDL (mg/dL) 05/05/7251 64* 6-64 Final  . LDL Cholesterol (mg/dL) 40/34/7425  956* 3-87 Final   Comment:                                 Total Cholesterol/HDL:CHD Risk                          Coronary Heart Disease Risk Table                                              Men   Women                           1/2 Average Risk   3.4   3.3                           Average Risk       5.0   4.4                           2 X Average Risk   9.6   7.1  3 X Average Risk  23.4   11.0                                                          Use the calculated Patient Ratio                          above and the CHD Risk Table                          to determine the patient's CHD Risk.                                                          ATP III CLASSIFICATION (LDL):                           <100     mg/dL   Optimal                           100-129  mg/dL   Near or Above                                             Optimal                           130-159  mg/dL   Borderline                           160-189  mg/dL   High                           >190     mg/dL   Very High  . TSH (uIU/mL) 07/18/2011 1.073  0.350-4.500 Final  . D-Dimer, Quant (ug/mL-FEU) 07/18/2011 <0.22  0.00-0.48 Final   Comment:                                 AT THE INHOUSE ESTABLISHED CUTOFF                          VALUE OF 0.48 ug/mL FEU,                          THIS ASSAY HAS BEEN DOCUMENTED                          IN THE LITERATURE TO HAVE                          A SENSITIVITY AND NEGATIVE  PREDICTIVE VALUE OF AT LEAST                          98 TO 99%.  THE TEST RESULT                          SHOULD BE CORRELATED WITH                          AN ASSESSMENT OF THE CLINICAL                          PROBABILITY OF DVT / VTE.  Marland Kitchen Hemoglobin A1C (%) 07/18/2011 12.4* <5.7 Final   Comment: (NOTE)                                                                                                                         According to the ADA  Clinical Practice Recommendations for 2011, when                          HbA1c is used as a screening test:                           >=6.5%   Diagnostic of Diabetes Mellitus                                    (if abnormal result is confirmed)                          5.7-6.4%   Increased risk of developing Diabetes Mellitus                          References:Diagnosis and Classification of Diabetes Mellitus,Diabetes                          Care,2011,34(Suppl 1):S62-S69 and Standards of Medical Care in                                  Diabetes - 2011,Diabetes Care,2011,34 (Suppl 1):S11-S61.  . Mean Plasma Glucose (mg/dL) 16/02/9603 540* <981 Final  . Microalb, Ur (mg/dL) 19/14/7829 5.62  1.30-8.65 Final  . Creatinine, Urine (mg/dL) 78/46/9629 52.8   Final  . Microalb Creat Ratio (mg/g) 07/18/2011 6.4  0.0-30.0 Final  . Total CK (U/L) 07/19/2011 78  7-232 Final  . CK, MB (ng/mL) 07/19/2011 1.6  0.3-4.0 Final  . Troponin I (ng/mL) 07/19/2011 <0.30  <0.30 Final   Comment:  Due to the release kinetics of cTnI,                          a negative result within the first hours                          of the onset of symptoms does not rule out                          myocardial infarction with certainty.                          If myocardial infarction is still suspected,                          repeat the test at appropriate intervals.  . Relative Index  07/19/2011 RELATIVE INDEX IS INVALID  0.0-2.5 Final   Comment: WHEN CK < 100 U/L                                  . Glucose-Capillary (mg/dL) 96/78/9381 017* 51-02 Final  . Comment 1  07/18/2011 Notify RN   Final  . Sodium (mEq/L) 07/19/2011 136  135-145 Final  . Potassium (mEq/L) 07/19/2011 4.0  3.5-5.1 Final  . Chloride (mEq/L) 07/19/2011 106  96-112 Final  . CO2 (mEq/L) 07/19/2011 22  19-32 Final  . Glucose, Bld (mg/dL) 58/52/7782 423* 53-61 Final  . BUN (mg/dL) 44/31/5400 10  8-67 Final  .  Creatinine, Ser (mg/dL) 61/95/0932 6.71  2.45-8.09 Final  . Calcium (mg/dL) 98/33/8250 8.6  5.3-97.6 Final  . GFR calc non Af Amer (mL/min) 07/19/2011 >90  >90 Final  . GFR calc Af Amer (mL/min) 07/19/2011 >90  >90 Final   Comment:                                 The eGFR has been calculated                          using the CKD EPI equation.                          This calculation has not been                          validated in all clinical                          situations.                          eGFR's persistently                          <90 mL/min signify                          possible Chronic Kidney Disease.  . Glucose-Capillary (mg/dL) 73/41/9379 024* 09-73 Final  . Glucose-Capillary (mg/dL) 53/29/9242 683* 41-96 Final  ] SignedYaakov Guthrie, BRAD 07/20/2011, 9:00 AM

## 2011-08-09 ENCOUNTER — Ambulatory Visit (INDEPENDENT_AMBULATORY_CARE_PROVIDER_SITE_OTHER): Payer: Self-pay | Admitting: Internal Medicine

## 2011-08-09 ENCOUNTER — Ambulatory Visit (INDEPENDENT_AMBULATORY_CARE_PROVIDER_SITE_OTHER): Payer: Self-pay | Admitting: Dietician

## 2011-08-09 ENCOUNTER — Encounter: Payer: Self-pay | Admitting: Internal Medicine

## 2011-08-09 VITALS — BP 133/86 | HR 82 | Temp 97.2°F | Ht 72.0 in | Wt 189.3 lb

## 2011-08-09 DIAGNOSIS — E119 Type 2 diabetes mellitus without complications: Secondary | ICD-10-CM | POA: Insufficient documentation

## 2011-08-09 DIAGNOSIS — I10 Essential (primary) hypertension: Secondary | ICD-10-CM

## 2011-08-09 DIAGNOSIS — K219 Gastro-esophageal reflux disease without esophagitis: Secondary | ICD-10-CM

## 2011-08-09 DIAGNOSIS — E785 Hyperlipidemia, unspecified: Secondary | ICD-10-CM | POA: Insufficient documentation

## 2011-08-09 HISTORY — DX: Type 2 diabetes mellitus without complications: E11.9

## 2011-08-09 HISTORY — DX: Gastro-esophageal reflux disease without esophagitis: K21.9

## 2011-08-09 LAB — GLUCOSE, CAPILLARY: Glucose-Capillary: 164 mg/dL — ABNORMAL HIGH (ref 70–99)

## 2011-08-09 MED ORDER — METFORMIN HCL 1000 MG PO TABS: 1000.0000 mg | ORAL_TABLET | Freq: Two times a day (BID) | ORAL | Status: DC

## 2011-08-09 NOTE — Assessment & Plan Note (Signed)
Continue pravachol Check CMET with other blood test next month

## 2011-08-09 NOTE — Progress Notes (Signed)
Diabetes Self-Management Training (DSMT)  Initial Visit  08/09/2011 [name], identified by name and date of birth, is a 46 y.o. male with Type 2 Diabetes. Year of diabetes diagnosis: 2013 Other persons present: family member patient and daughter  ASSESSMENT Patient concerns are Nutrition/meal planning, Medication, Monitoring, Problem solving and Glycemic control.  There were no vitals taken for this visit. There is no height or weight on file to calculate BMI. Lab Results  Component Value Date   LDLCALC 107* 07/18/2011   Lab Results  Component Value Date   HGBA1C 12.4* 07/18/2011   Labs reviewed. Snapshot reviewed Asprin? Yes Family history of diabetes: Yes Support systems: spouse and friends Special needs: None Prior DM Education: Yes Patients belief/attitude about diabetes: Diabetes can be controlled. Self foot exams daily: Yes Diabetes Complications: None   Medications See Medications list.  Is interested in learning more    Exercise Plan Doing ADLs, physical/active work and walking for 60 minutesa day.   Self-Monitoring Frequency of testing: stopped testing Breakfast: 160s when he checked last Medication Nutrition Monitor: has an off brand and ran out of strip-cannot afford a new one- suggested he meet with financial counselor and if he gets orange card- can call in meter and strips there for 6$ Hyperglycemia: No Hypoglycemia: No   Meal Planning Some knowledge and Interested in improving has stopped regular soda, trying to eat 3 meals a day, 60-75 grams carb, reading labels some.   Assessment comments: has family history of diabetes and many members had complications to their diabetes.    INDIVIDUAL DIABETES EDUCATION PLAN:  Nutrition management Medication Monitoring Acute complication: Chronic complications _______________________________________________________________________  Intervention TOPICS COVERED TODAY:  Nutrition management  Role  of diet in the treatment of diabetes and the relationship between the three main macronutritents and blood glucose control. Food label reading, portion sizes and measuring food. Meal timing in regards to the patients' current diabetes medication. Medication  Reviewed patients medication for diabetes, action, purpose, timing of dose and side effects. Monitoring  Purpose and frequency of SMBG. suggested that he could use A1C to monitor Diabetes and do weekly prfile if he desires to self monitoring to save money and stiull have very useful information Acute complication  Discussed and identified patients' treatment of hyperglycemia. Chronic complications  Relationship between chronic complications and blood glucose control Assessed and discussed foot care and prevention of foot problems Reviewed with patient heart disease, higher risk of, and prevention of  PATIENTS GOALS/PLAN (copy and paste in patient instructions so patient receives a copy): 1.  Learning Objective:       Know how to care for diabetes and who to call when he has questions 2.  Behavioral Objective:         Nutrition: To improve blood glucose control I will follow meal plan of three balanced meals a day Sometimes 25%  Personalized Follow-Up Plan for Ongoing Self Management Support:  Doctor's Office, friends, family and CDE visits ______________________________________________________________________   Outcomes Expected outcomes: Demonstrated interest in learning.Expect positive changes in lifestyle.  Self-care Barriers: Lack of transportation, Lack of material resources  Education material provided: yes  Patient to contact team via Phone if problems or questions.  Time in: 1430     Time out: 1500  Future DSMT - 4-6 wks   Akshar Starnes, Lupita Leash

## 2011-08-09 NOTE — Progress Notes (Signed)
Patient ID: Jonathan Barker, male   DOB: 06/25/1965, 46 y.o.   MRN: [id number]  46 Y/o with pmh of recently Dx DM, HLD, HTN comes for HFU No complaints today Complaint with medications No medications side effect See individual a/p for further details.   Physical exam  General Appearance:     Filed Vitals:   08/09/11 1433  BP: 133/86  Pulse: 82  Temp: 97.2 F (36.2 C)  TempSrc: Oral  Height: 6' (1.829 m)  Weight: 189 lb 4.8 oz (85.866 kg)  SpO2: 98%     Alert, cooperative, no distress, appears stated age  Head:    Normocephalic, without obvious abnormality, atraumatic  Eyes:    PERRL, conjunctiva/corneas clear, EOM's intact, fundi    benign, both eyes       Neck:   Supple, symmetrical, trachea midline, no adenopathy;       thyroid:  No enlargement/tenderness/nodules; no carotid   bruit or JVD  Lungs:     Clear to auscultation bilaterally, respirations unlabored  Chest wall:    No tenderness or deformity  Heart:    Regular rate and rhythm, S1 and S2 normal, no murmur, rub   or gallop  Abdomen:     Soft, non-tender, bowel sounds active all four quadrants,    no masses, no organomegaly  Extremities:   Extremities normal, atraumatic, no cyanosis or edema  Pulses:   2+ and symmetric all extremities  Skin:   Skin color, texture, turgor normal, no rashes or lesions  Neurologic:  nonfocal grossly    ROS  Constitutional: Denies fever, chills, diaphoresis, appetite change and fatigue.  Respiratory: Denies SOB, DOE, cough, chest tightness,  and wheezing.   Cardiovascular: Denies chest pain, palpitations and leg swelling.  Gastrointestinal: Denies nausea, vomiting, abdominal pain, diarrhea, constipation, blood in stool and abdominal distention.  Skin: Denies pallor, rash and wound.  Neurological: Denies dizziness, light-headedness, numbness and headaches.

## 2011-08-09 NOTE — Assessment & Plan Note (Signed)
omeprazole

## 2011-08-09 NOTE — Assessment & Plan Note (Signed)
On metformin 500 bid CBGs <200 at home  PLan - increase metformin to 1000 bid - See Lupita Leash P - lifestyle modification ,.smoking cessation, CBG checks  - Follow up in 1 month to ensure compliance with meds, meter log assessment to see if needs glipizde

## 2011-09-09 ENCOUNTER — Encounter: Payer: Self-pay | Admitting: Internal Medicine

## 2011-09-09 ENCOUNTER — Ambulatory Visit (INDEPENDENT_AMBULATORY_CARE_PROVIDER_SITE_OTHER): Payer: Self-pay | Admitting: Internal Medicine

## 2011-09-09 VITALS — BP 119/83 | HR 104 | Temp 97.8°F | Wt 189.5 lb

## 2011-09-09 DIAGNOSIS — K219 Gastro-esophageal reflux disease without esophagitis: Secondary | ICD-10-CM

## 2011-09-09 DIAGNOSIS — Z23 Encounter for immunization: Secondary | ICD-10-CM

## 2011-09-09 DIAGNOSIS — S99921A Unspecified injury of right foot, initial encounter: Secondary | ICD-10-CM | POA: Insufficient documentation

## 2011-09-09 DIAGNOSIS — E785 Hyperlipidemia, unspecified: Secondary | ICD-10-CM

## 2011-09-09 DIAGNOSIS — X58XXXA Exposure to other specified factors, initial encounter: Secondary | ICD-10-CM

## 2011-09-09 DIAGNOSIS — F17200 Nicotine dependence, unspecified, uncomplicated: Secondary | ICD-10-CM

## 2011-09-09 DIAGNOSIS — S99929A Unspecified injury of unspecified foot, initial encounter: Secondary | ICD-10-CM

## 2011-09-09 DIAGNOSIS — E119 Type 2 diabetes mellitus without complications: Secondary | ICD-10-CM

## 2011-09-09 DIAGNOSIS — Z720 Tobacco use: Secondary | ICD-10-CM

## 2011-09-09 LAB — GLUCOSE, CAPILLARY: Glucose-Capillary: 254 mg/dL — ABNORMAL HIGH (ref 70–99)

## 2011-09-09 NOTE — Assessment & Plan Note (Signed)
Has cut back from 2 packs per day to one pack per day. I encouraged him to continue to cut back with the ultimate goal of smoking cessation. He is agreeable.

## 2011-09-09 NOTE — Progress Notes (Signed)
Subjective:   Patient ID: Jonathan Barker male   DOB: 1965-06-18 46 y.o.   MRN: [id number]  HPI: Jonathan Barker is a 46 y.o. with past medical history significant for diabetes, GERD and hyperlipidemia.  He presents today for diabetes and hyperlipidemia followup. He has not taken any medications in the last 2 weeks due to financial difficulties. He was approved for the [facility] yesterday. He reports that he checks his sugar every few days, and that his blood sugars range from 150-200s.  He has almost completely cut mountain dew & ice cream from his diet. He has improved his diet by eating smaller portions and trying to count carbohydrates. He seems to have great support from his long-term girlfriend. He has yet to start exercising. He does note significant weight loss since diagnosis.  Our records indicate a 7 pound weight loss over the last 2 months.  No significant polyuria, polydipsia, polyphagia and vision changes. No headaches, dizziness, shortness of breath, headaches, or chest pain.  Prior to running out of medication, he was able to tolerate 1000 mg of metformin twice daily.  He continues to smoke. He has decreased his cigarette usage to one pack per day from 2 packs per day.   Current Outpatient Prescriptions  Medication Sig Dispense Refill  . aspirin EC 81 MG tablet Take 1 tablet (81 mg total) by mouth daily.      . metFORMIN (GLUCOPHAGE) 1000 MG tablet Take 1 tablet (1,000 mg total) by mouth 2 (two) times daily with a meal.  60 tablet  11  . omeprazole (PRILOSEC) 20 MG capsule Take 1 capsule (20 mg total) by mouth daily.  120 capsule  1  . pravastatin (PRAVACHOL) 40 MG tablet Take 1 tablet (40 mg total) by mouth daily.  30 tablet  4   Review of Systems: Constitutional: Denies fever, chills, diaphoresis, appetite change.  HEENT: Denies photophobia, eye pain, redness, hearing loss, ear pain, congestion, sore throat, rhinorrhea, sneezing, mouth sores, trouble swallowing,  neck pain, neck stiffness and tinnitus.   Respiratory: Denies SOB, DOE, cough, chest tightness,  and wheezing.   Cardiovascular: Denies chest pain, palpitations and leg swelling.  Gastrointestinal: Denies nausea, vomiting, abdominal pain, diarrhea, constipation, blood in stool and abdominal distention.  Genitourinary: Denies dysuria, urgency, frequency, hematuria, flank pain and difficulty urinating.  Musculoskeletal: Denies myalgias, back pain, joint swelling, arthralgias and gait problem.  Skin: Denies pallor, rash and wound.  Neurological: Denies dizziness, seizures, syncope, weakness, light-headedness, numbness.  Psychiatric/Behavioral: Denies suicidal ideation, mood changes, confusion, nervousness, sleep disturbance and agitation  Objective:  Physical Exam: Filed Vitals:   09/09/11 1500  BP: 119/83  Pulse: 104  Temp: 97.8 F (36.6 C)  TempSrc: Oral  Weight: 189 lb 8 oz (85.957 kg)   Constitutional: Vital signs reviewed.  Patient is a well-developed and well-nourished man in no acute distress and cooperative with exam.  he is accompanied by his longtime girlfriend   Mouth: no erythema or exudates, MMM, poor dentition Eyes: PERRL, EOMI, conjunctivae normal, No scleral icterus.  Neck: No thyromegaly Cardiovascular: Mildly tachycardic, regular, S1 normal, S2 normal, no MRG, pulses symmetric and intact bilaterally Pulmonary/Chest: CTAB, no wheezes, rales, or rhonchi Abdominal: Soft. Non-tender, non-distended, bowel sounds are normal Musculoskeletal: Hyperpigmented skin beneath right first toenail, may be hematoma; full range of motion of right toe; no discoloration of right toe itself Neurological: A&O x3, Strength is normal and symmetric bilaterally, cranial nerve II-XII are grossly intact, no focal motor deficit, sensory intact  to light touch bilaterally.  Skin: Warm, dry and intact. No rash, cyanosis, or clubbing.  Psychiatric: Normal mood and affect. speech and behavior is normal.  Judgment and thought content normal. Cognition and memory are normal.   Assessment & Plan:   Case and care discussed with Dr. Meredith Pel. Patient to return next month for three-month diabetes followup. Please see problem-oriented charting for further details.

## 2011-09-09 NOTE — Patient Instructions (Addendum)
-  Please restart your metformin and pravastatin as soon as possible.  -Please restart Prilosec. We will keep you on this medication for 3 months and then see how you do off of it  -I will send him prescriptions for a glucometer and testing supplies. Please check your blood sugar every 1-2 days.  -A few her right first toe pain worsens or appears worse, please call the clinic as soon as possible.  -Drink lots of water!!  Please be sure to bring all of your medications with you to every visit.  Should you have any new or worsening symptoms, please be sure to call the clinic at [phone].

## 2011-09-09 NOTE — Assessment & Plan Note (Addendum)
-  Patient had not been taking medication for last 2 weeks due to financial difficulties. He is now been approved for the [facility] card.  -Restart metformin today 1000 mg by mouth twice a day, he may need to titrate up to avoid GI disturbances. At next appointment, we will be able to check hemoglobin A1c to see how much improvement was made with essentially diet alone. At this appointment we may need to start a sulfonylurea. I have also begun the discussion of possible insulin initiation if no significant improvement.

## 2011-09-09 NOTE — Assessment & Plan Note (Signed)
He has not taken pravastatin for the last 2 weeks due to financial difficulties.  He was approved for the orange card yesterday. He will restart pravastatin daily.

## 2011-09-09 NOTE — Assessment & Plan Note (Signed)
It appears that he has a hematoma beneath his right first toe nail.  Pain has improved. I've asked that he continue to monitor this closely, and if it worsens to call us

## 2011-09-09 NOTE — Assessment & Plan Note (Signed)
-  He has not taken Prilosec due to financial difficulties. He has had episodes of heartburn. He will restart Prilosec with a plan to continue for 3 months, and then do a trial off of this medication. I'm hopeful that he'll not require Prilosec long term given his lifestyle modifications.

## 2011-10-12 ENCOUNTER — Encounter: Payer: Self-pay | Admitting: Internal Medicine

## 2012-01-14 ENCOUNTER — Encounter: Payer: Self-pay | Admitting: Internal Medicine

## 2012-01-21 ENCOUNTER — Encounter: Payer: Self-pay | Admitting: Internal Medicine

## 2012-01-21 ENCOUNTER — Ambulatory Visit (INDEPENDENT_AMBULATORY_CARE_PROVIDER_SITE_OTHER): Payer: Self-pay | Admitting: Internal Medicine

## 2012-01-21 VITALS — BP 120/75 | HR 87 | Temp 98.4°F | Ht 71.0 in | Wt 186.6 lb

## 2012-01-21 DIAGNOSIS — E1165 Type 2 diabetes mellitus with hyperglycemia: Secondary | ICD-10-CM

## 2012-01-21 DIAGNOSIS — E785 Hyperlipidemia, unspecified: Secondary | ICD-10-CM

## 2012-01-21 LAB — POCT GLYCOSYLATED HEMOGLOBIN (HGB A1C): Hemoglobin A1C: 6.9

## 2012-01-21 MED ORDER — METFORMIN HCL 1000 MG PO TABS: ORAL_TABLET | ORAL | Status: DC

## 2012-01-21 NOTE — Patient Instructions (Signed)
1.  Continue the metformin for your sugars.  2.  Stop in the lab to have your blood drawn.  I will call you if we need to start a medication for your cholesterol  3.  Pick up the Diabetic steel toed boots to help protect your feet when you're working  4.  Follow up in about 3 months to see how you are doing.

## 2012-01-21 NOTE — Progress Notes (Signed)
  Subjective:   Patient ID: Jonathan Barker male   DOB: 1965-08-31 46 y.o.   MRN: [id number]  HPI: Mr.Mabry A Pyon is a 46 y.o. man who presents to the clinic today for follow up on his chronic medical conditions including diabetes and hyperlipidemia.  See Problem focused Assessment and Plan for full details of his chronic medical conditions.   Past Medical History  Diagnosis Date  . Shortness of breath   . Hepatitis   . DM (diabetes mellitus) 08/09/2011    A1c at diagnosis = 12.4  . GERD (gastroesophageal reflux disease) 08/09/2011   Current Outpatient Prescriptions  Medication Sig Dispense Refill  . aspirin EC 81 MG tablet Take 1 tablet (81 mg total) by mouth daily.      . metFORMIN (GLUCOPHAGE) 1000 MG tablet Take 1 tablet (1,000 mg total) by mouth 2 (two) times daily with a meal.  60 tablet  11  . omeprazole (PRILOSEC) 20 MG capsule Take 1 capsule (20 mg total) by mouth daily.  120 capsule  1  . pravastatin (PRAVACHOL) 40 MG tablet Take 1 tablet (40 mg total) by mouth daily.  30 tablet  4   Family History  Problem Relation Age of Onset  . Breast cancer Mother   . Diabetes Father   . Heart disease Father   . Diabetes Paternal Uncle    History   Social History  . Marital Status: Significant Other    Spouse Name: N/A    Number of Children: N/A  . Years of Education: N/A   Social History Main Topics  . Smoking status: Current Every Day Smoker -- 1.0 packs/day    Types: Cigarettes  . Smokeless tobacco: Never Used   Comment: cutting back  . Alcohol Use: No  . Drug Use: Yes    Special: Marijuana  . Sexually Active: None   Other Topics Concern  . None   Social History Narrative  . None   Review of Systems: A full 12 system ROS is negative except as noted in the HPI and A&P.   Objective:  Physical Exam: Filed Vitals:   01/21/12 1405  BP: 120/75  Pulse: 87  Temp: 98.4 F (36.9 C)  TempSrc: Oral  Height: 5\' 11"  (1.803 m)  Weight: 186 lb 9.6 oz (84.641 kg)   SpO2: 98%   Constitutional: Vital signs reviewed.  Patient is a well-developed and well-nourished man in no acute distress and cooperative with exam. Alert and oriented x3.  Head: Normocephalic and atraumatic Ear: TM normal bilaterally Mouth: no erythema or exudates, MMM Eyes: PERRL, EOMI, conjunctivae normal, No scleral icterus.  Neck: Supple, Trachea midline normal ROM, No JVD, mass, thyromegaly, or carotid bruit present.  Cardiovascular: RRR, S1 normal, S2 normal, no MRG, pulses symmetric and intact bilaterally Pulmonary/Chest: CTAB, no wheezes, rales, or rhonchi Abdominal: Soft. Non-tender, non-distended, bowel sounds are normal, no masses, organomegaly, or guarding present.  GU: no CVA tenderness Musculoskeletal: No joint deformities, erythema, or stiffness, ROM full and no nontender Hematology: no cervical, inginal, or axillary adenopathy.  Neurological: A&O x3, Strength is normal and symmetric bilaterally, cranial nerve II-XII are grossly intact, no focal motor deficit, sensory intact to light touch bilaterally.  Skin: Warm, dry and intact. No rash, cyanosis, or clubbing.  Psychiatric: Normal mood and affect. speech and behavior is normal. Judgment and thought content normal. Cognition and memory are normal.   Assessment & Plan:

## 2012-01-22 LAB — LIPID PANEL
HDL: 31 mg/dL — ABNORMAL LOW (ref >39)
LDL Cholesterol: 115 mg/dL — ABNORMAL HIGH (ref 0–99)
Triglycerides: 191 mg/dL — ABNORMAL HIGH (ref <150)
VLDL: 38 mg/dL (ref 0–40)

## 2012-01-28 ENCOUNTER — Encounter: Payer: Self-pay | Admitting: Internal Medicine

## 2012-02-17 ENCOUNTER — Encounter (HOSPITAL_COMMUNITY): Payer: Self-pay | Admitting: Nurse Practitioner

## 2012-02-17 ENCOUNTER — Emergency Department (HOSPITAL_COMMUNITY)
Admission: EM | Admit: 2012-02-17 | Discharge: 2012-02-17 | Disposition: A | Discharge status: Home / Self Care | Payer: Self-pay | Attending: Emergency Medicine | Admitting: Emergency Medicine

## 2012-02-17 DIAGNOSIS — E119 Type 2 diabetes mellitus without complications: Secondary | ICD-10-CM | POA: Insufficient documentation

## 2012-02-17 DIAGNOSIS — K219 Gastro-esophageal reflux disease without esophagitis: Secondary | ICD-10-CM | POA: Insufficient documentation

## 2012-02-17 DIAGNOSIS — K047 Periapical abscess without sinus: Secondary | ICD-10-CM | POA: Insufficient documentation

## 2012-02-17 MED ORDER — AMOXICILLIN 500 MG PO CAPS
500.0000 mg | ORAL_CAPSULE | Freq: Once | ORAL | Status: AC
  Administered 2012-02-17: 500 mg via ORAL
  Filled 2012-02-17: qty 1

## 2012-02-17 MED ORDER — AMOXICILLIN 500 MG PO CAPS: 500.0000 mg | ORAL_CAPSULE | Freq: Three times a day (TID) | ORAL | Status: DC

## 2012-02-17 MED ORDER — OXYCODONE-ACETAMINOPHEN 5-325 MG PO TABS: 2.0000 | ORAL_TABLET | ORAL | Status: DC | PRN

## 2012-02-17 NOTE — ED Notes (Signed)
Pt reports slight L sided toothache Monday night and has L cheek/jaw swelling since. Attempted dental appt but they said they would call him when they had availability

## 2012-02-17 NOTE — ED Provider Notes (Signed)
History   This chart was scribed for Richardean Canal, MD by Charolett Bumpers . The patient was seen in room TR07C/TR07C. Patient's care was started at 1207.   CSN: [id number] Arrival date & time 02/17/12  1151  First MD Initiated Contact with Patient 02/17/12 1207      Chief Complaint  Patient presents with  . Facial Swelling    The history is provided by the patient. No language interpreter was used.   Jonathan Barker is a 46 y.o. male who presents to the Emergency Department complaining of left sided swelling for the past two day that has been gradually worsening. He reports associated left sided dental pain and subjective fever. Temperature here in ED is 97.7. He states that it feels like an abscess. He has a h/o poor dentition and abscess in the past. He states that he is waiting for a dental appointment because he has an orange card.    Past Medical History  Diagnosis Date  . Shortness of breath   . Hepatitis   . DM (diabetes mellitus) 08/09/2011    A1c at diagnosis = 12.4  . GERD (gastroesophageal reflux disease) 08/09/2011    Past Surgical History  Procedure Date  . Appendectomy     Family History  Problem Relation Age of Onset  . Breast cancer Mother   . Diabetes Father   . Heart disease Father   . Diabetes Paternal Uncle     History  Substance Use Topics  . Smoking status: Current Every Day Smoker -- 1.0 packs/day    Types: Cigarettes  . Smokeless tobacco: Never Used   Comment: cutting back  . Alcohol Use: No      Review of Systems  Constitutional: Positive for fever. Negative for chills.  HENT: Positive for facial swelling and dental problem.   Respiratory: Negative for shortness of breath.   Gastrointestinal: Negative for nausea and vomiting.  Neurological: Negative for weakness.  All other systems reviewed and are negative.    Allergies  Review of patient's allergies indicates no known allergies.  Home Medications   Current Outpatient Rx    Name Route Sig Dispense Refill  . AMOXICILLIN 500 MG PO CAPS Oral Take 1 capsule (500 mg total) by mouth 3 (three) times daily. For 10 days 30 capsule 0  . ASPIRIN EC 81 MG PO TBEC Oral Take 1 tablet (81 mg total) by mouth daily.    Marland Kitchen METFORMIN HCL 1000 MG PO TABS  Take 1000 mg with breakfast and 500 mg at supper. 60 tablet 11  . OXYCODONE-ACETAMINOPHEN 5-325 MG PO TABS Oral Take 2 tablets by mouth every 4 (four) hours as needed for pain. 8 tablet 0  . PRAVASTATIN SODIUM 40 MG PO TABS Oral Take 1 tablet (40 mg total) by mouth daily. 30 tablet 4    BP 113/79  Pulse 100  Temp 97.7 F (36.5 C) (Oral)  Resp 20  SpO2 100%  Physical Exam  Nursing note and vitals reviewed. Constitutional: He is oriented to person, place, and time. He appears well-developed and well-nourished. No distress.  HENT:  Head: Normocephalic and atraumatic.  Mouth/Throat:         Poor dentition throughout. Swelling and minimal edema around L lower molar. Not fluctuant, minimally red.   Eyes: EOM are normal.  Neck: Neck supple. No tracheal deviation present.       + L cervical lymphadenopathy   Cardiovascular: Normal rate.   Pulmonary/Chest: Effort normal. No respiratory distress.  Musculoskeletal: Normal range of motion.  Neurological: He is alert and oriented to person, place, and time.  Skin: Skin is warm and dry.  Psychiatric: He has a normal mood and affect. His behavior is normal.    ED Course  Procedures (including critical care time)  DIAGNOSTIC STUDIES: Oxygen Saturation is 100% on room air, normal by my interpretation.    COORDINATION OF CARE:  12:11-Discussed planned course of treatment with the patient including treatment d/c home with antibiotics, pain medication. Discussed  f/u with dentist, who is agreeable at this time.   12:15-Medication Orders: Amoxicillin (Amoxil) capsule 500 mg-once  Labs Reviewed - No data to display No results found.   1. Dental abscess       MDM   Jonathan Barker is a 46 y.o. male here with early L lower molar dental abscess. Recommend amoxicllin, pain meds for now. Patient will see his own dentist next week.    This document was completed by the scribe at my direction and I have reviewed its accuracy. I have personally examined the patient and agrees with the above document.   Chaney Malling, MD       Richardean Canal, MD 02/17/12 [phone]

## 2012-03-06 ENCOUNTER — Telehealth: Payer: Self-pay | Admitting: *Deleted

## 2012-03-06 NOTE — Telephone Encounter (Signed)
CALLED AND LEFT VOICE MESSAGE FOR PATIENT TO CALL [facility], CALLED PATIENT TO GET HIS SCHEDULED FOR [facility] EXAM. Jonathan Barker NT [phone]  11:05AM

## 2012-04-06 NOTE — Addendum Note (Signed)
Addended by: Neomia Dear on: 04/06/2012 08:07 AM   Modules accepted: Orders

## 2012-04-19 ENCOUNTER — Ambulatory Visit: Payer: Self-pay

## 2012-05-31 ENCOUNTER — Other Ambulatory Visit: Payer: Self-pay | Admitting: Internal Medicine

## 2012-10-30 NOTE — Assessment & Plan Note (Signed)
He has diabetes and has not been screened for hyperlipidemia in a while.  He states that he has not been taking the pravastatin that was prescribed 6 months ago.   Lab Results  Component Value Date   CHOL 184 01/21/2012   CHOL 193 07/18/2011   Lab Results  Component Value Date   HDL 31* 01/21/2012   HDL 22* 07/18/2011   Lab Results  Component Value Date   LDLCALC 115* 01/21/2012   LDLCALC 107* 07/18/2011   Lab Results  Component Value Date   TRIG 191* 01/21/2012   TRIG 322* 07/18/2011   Lab Results  Component Value Date   CHOLHDL 5.9 01/21/2012   CHOLHDL 8.8 07/18/2011   No results found for this basename: LDLDIRECT   His HDL is markedly low and his LDL is above his goal of less then 100.  I called and spoke with him about starting Pravastatin 20 mg daily and rechecking in 3 months.

## 2012-10-30 NOTE — Assessment & Plan Note (Addendum)
He was noted to be a diabetic when he was in the ED for chest pain.  He states that he has been taking the metformin 1000 mg daily for his blood sugars and was working to increase it to 1000 mg BID but noted that he was having diarrhea at the full dose.  He states that he occasionally does okay with the 1000 mg in the morning and 500 mg in the evening.    Lab Results  Component Value Date   HGBA1C 6.9 01/21/2012   HGBA1C 12.4* 07/18/2011   Lab Results  Component Value Date   MICROALBUR 0.50 07/18/2011   LDLCALC 115* 01/21/2012   CREATININE 0.65 07/19/2011   A1C is much improved.  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  We will continue his metformin for now and I encouraged him to take the 1000 mg in the AM and 500 mg in the evening with food to see if that helps.  If he is able to tolerate that for a week then he should increase it to 1000 mg BID with food.

## 2012-11-09 ENCOUNTER — Other Ambulatory Visit: Payer: Self-pay

## 2012-11-27 ENCOUNTER — Ambulatory Visit: Payer: Self-pay

## 2013-01-05 ENCOUNTER — Encounter: Payer: Self-pay | Admitting: Internal Medicine

## 2013-04-06 ENCOUNTER — Encounter: Payer: No Typology Code available for payment source | Admitting: Internal Medicine

## 2013-04-16 ENCOUNTER — Ambulatory Visit: Payer: No Typology Code available for payment source

## 2013-04-19 ENCOUNTER — Ambulatory Visit: Payer: Self-pay

## 2013-04-23 ENCOUNTER — Ambulatory Visit: Payer: No Typology Code available for payment source

## 2013-05-11 ENCOUNTER — Encounter: Payer: No Typology Code available for payment source | Admitting: Internal Medicine

## 2013-05-18 ENCOUNTER — Emergency Department (HOSPITAL_COMMUNITY): Payer: No Typology Code available for payment source

## 2013-05-18 ENCOUNTER — Inpatient Hospital Stay (HOSPITAL_COMMUNITY)
Admission: EM | Admit: 2013-05-18 | Discharge: 2013-05-25 | DRG: 026 | Disposition: A | Discharge status: Home / Self Care | Payer: No Typology Code available for payment source | Attending: Internal Medicine | Admitting: Internal Medicine

## 2013-05-18 ENCOUNTER — Encounter (HOSPITAL_COMMUNITY): Payer: Self-pay | Admitting: Emergency Medicine

## 2013-05-18 DIAGNOSIS — E872 Acidosis, unspecified: Secondary | ICD-10-CM | POA: Diagnosis present

## 2013-05-18 DIAGNOSIS — Z91199 Patient's noncompliance with other medical treatment and regimen due to unspecified reason: Secondary | ICD-10-CM

## 2013-05-18 DIAGNOSIS — K219 Gastro-esophageal reflux disease without esophagitis: Secondary | ICD-10-CM | POA: Diagnosis present

## 2013-05-18 DIAGNOSIS — G935 Compression of brain: Secondary | ICD-10-CM

## 2013-05-18 DIAGNOSIS — K759 Inflammatory liver disease, unspecified: Secondary | ICD-10-CM | POA: Diagnosis present

## 2013-05-18 DIAGNOSIS — G9389 Other specified disorders of brain: Secondary | ICD-10-CM

## 2013-05-18 DIAGNOSIS — E1165 Type 2 diabetes mellitus with hyperglycemia: Secondary | ICD-10-CM

## 2013-05-18 DIAGNOSIS — F17200 Nicotine dependence, unspecified, uncomplicated: Secondary | ICD-10-CM | POA: Diagnosis present

## 2013-05-18 DIAGNOSIS — IMO0002 Reserved for concepts with insufficient information to code with codable children: Secondary | ICD-10-CM

## 2013-05-18 DIAGNOSIS — IMO0001 Reserved for inherently not codable concepts without codable children: Secondary | ICD-10-CM | POA: Diagnosis present

## 2013-05-18 DIAGNOSIS — Z720 Tobacco use: Secondary | ICD-10-CM | POA: Diagnosis present

## 2013-05-18 DIAGNOSIS — E119 Type 2 diabetes mellitus without complications: Secondary | ICD-10-CM | POA: Diagnosis present

## 2013-05-18 DIAGNOSIS — Z79899 Other long term (current) drug therapy: Secondary | ICD-10-CM

## 2013-05-18 DIAGNOSIS — F1210 Cannabis abuse, uncomplicated: Secondary | ICD-10-CM | POA: Diagnosis present

## 2013-05-18 DIAGNOSIS — E785 Hyperlipidemia, unspecified: Secondary | ICD-10-CM | POA: Diagnosis present

## 2013-05-18 DIAGNOSIS — Z9119 Patient's noncompliance with other medical treatment and regimen: Secondary | ICD-10-CM

## 2013-05-18 DIAGNOSIS — D496 Neoplasm of unspecified behavior of brain: Secondary | ICD-10-CM | POA: Diagnosis present

## 2013-05-18 DIAGNOSIS — R569 Unspecified convulsions: Secondary | ICD-10-CM | POA: Diagnosis present

## 2013-05-18 DIAGNOSIS — C711 Malignant neoplasm of frontal lobe: Principal | ICD-10-CM | POA: Diagnosis present

## 2013-05-18 HISTORY — DX: Unspecified convulsions: R56.9

## 2013-05-18 LAB — URINALYSIS, ROUTINE W REFLEX MICROSCOPIC
Bilirubin Urine: NEGATIVE
HGB URINE DIPSTICK: NEGATIVE
KETONES UR: NEGATIVE mg/dL
Leukocytes, UA: NEGATIVE
Nitrite: NEGATIVE
PH: 6.5 (ref 5.0–8.0)
PROTEIN: NEGATIVE mg/dL
Specific Gravity, Urine: 1.034 — ABNORMAL HIGH (ref 1.005–1.030)
Urobilinogen, UA: 1 mg/dL (ref 0.0–1.0)

## 2013-05-18 LAB — COMPREHENSIVE METABOLIC PANEL
ALT: 28 U/L (ref 0–53)
AST: 24 U/L (ref 0–37)
Albumin: 3.9 g/dL (ref 3.5–5.2)
Alkaline Phosphatase: 89 U/L (ref 39–117)
BUN: 14 mg/dL (ref 6–23)
CALCIUM: 10.1 mg/dL (ref 8.4–10.5)
CO2: 22 mEq/L (ref 19–32)
CREATININE: 0.77 mg/dL (ref 0.50–1.35)
Chloride: 101 mEq/L (ref 96–112)
GFR calc non Af Amer: >90 mL/min (ref >90)
GLUCOSE: 227 mg/dL — AB (ref 70–99)
Potassium: 4.1 mEq/L (ref 3.7–5.3)
Sodium: 139 mEq/L (ref 137–147)
TOTAL PROTEIN: 7.5 g/dL (ref 6.0–8.3)
Total Bilirubin: 0.2 mg/dL — ABNORMAL LOW (ref 0.3–1.2)

## 2013-05-18 LAB — CBC WITH DIFFERENTIAL/PLATELET
Basophils Absolute: 0.1 10*3/uL (ref 0.0–0.1)
Basophils Relative: 1 % (ref 0–1)
Eosinophils Absolute: 0.3 10*3/uL (ref 0.0–0.7)
Eosinophils Relative: 2 % (ref 0–5)
HCT: 47.8 % (ref 39.0–52.0)
Hemoglobin: 17.2 g/dL — ABNORMAL HIGH (ref 13.0–17.0)
Lymphocytes Relative: 25 % (ref 12–46)
Lymphs Abs: 3 10*3/uL (ref 0.7–4.0)
MCH: 32.3 pg (ref 26.0–34.0)
MCHC: 36 g/dL (ref 30.0–36.0)
MCV: 89.7 fL (ref 78.0–100.0)
Monocytes Absolute: 1.3 10*3/uL — ABNORMAL HIGH (ref 0.1–1.0)
Monocytes Relative: 11 % (ref 3–12)
Neutro Abs: 7.6 10*3/uL (ref 1.7–7.7)
Neutrophils Relative %: 62 % (ref 43–77)
Platelets: 309 10*3/uL (ref 150–400)
RBC: 5.33 MIL/uL (ref 4.22–5.81)
RDW: 12.2 % (ref 11.5–15.5)
WBC: 12.3 10*3/uL — ABNORMAL HIGH (ref 4.0–10.5)

## 2013-05-18 LAB — URINE MICROSCOPIC-ADD ON

## 2013-05-18 LAB — RAPID URINE DRUG SCREEN, HOSP PERFORMED
AMPHETAMINES: NOT DETECTED
BENZODIAZEPINES: NOT DETECTED
Barbiturates: NOT DETECTED
Cocaine: NOT DETECTED
OPIATES: NOT DETECTED
Tetrahydrocannabinol: POSITIVE — AB

## 2013-05-18 LAB — GLUCOSE, CAPILLARY: GLUCOSE-CAPILLARY: 216 mg/dL — AB (ref 70–99)

## 2013-05-18 MED ORDER — GADOBENATE DIMEGLUMINE 529 MG/ML IV SOLN
18.0000 mL | Freq: Once | INTRAVENOUS | Status: AC | PRN
  Administered 2013-05-18: 18 mL via INTRAVENOUS

## 2013-05-18 MED ORDER — SODIUM CHLORIDE 0.9 % IV BOLUS (SEPSIS)
1000.0000 mL | Freq: Once | INTRAVENOUS | Status: AC
  Administered 2013-05-18: 1000 mL via INTRAVENOUS

## 2013-05-18 NOTE — ED Notes (Signed)
Pt taken to MRI  

## 2013-05-18 NOTE — ED Provider Notes (Addendum)
CSN: [id number]     Arrival date & time 05/18/13  1835 History   First MD Initiated Contact with Patient 05/18/13 1858     Chief Complaint  Patient presents with  . Seizures   (Consider location/radiation/quality/duration/timing/severity/associated sxs/prior Treatment) HPI This is a 48 year old male with history of cigarette smoking and diabetes who has been noncompliant with his medications and presents today after a shaking episode. He states that his eyes began blinking. Sleep and then he felt like he was going to shake all over so he sat down on the ground and then a plane on his left side having shaking all over. He denies any loss of consciousness. There was no post ictal phase. His daughter is present and states that she witnessed the event and did note that his legs were shaking. He is now back to his usual state. His blood sugar was in the upper 200s and route. He has not been taking his metformin for the past 6 months. He denies any alcohol use. He did not bite his tongue and had no loss of control of his bowels or bladder. He states he did feel somewhat lightheaded during this episode. He has had no prior episodes. He has not had any known head injury. He has no prior history of any seizures or passing out episodes. Past Medical History  Diagnosis Date  . Shortness of breath   . Hepatitis   . DM (diabetes mellitus) 08/09/2011    A1c at diagnosis = 12.4  . GERD (gastroesophageal reflux disease) 08/09/2011   Past Surgical History  Procedure Laterality Date  . Appendectomy     Family History  Problem Relation Age of Onset  . Breast cancer Mother   . Diabetes Father   . Heart disease Father   . Diabetes Paternal Uncle    History  Substance Use Topics  . Smoking status: Current Every Day Smoker -- 1.00 packs/day    Types: Cigarettes  . Smokeless tobacco: Never Used     Comment: cutting back  . Alcohol Use: No    Review of Systems  Respiratory: Positive for cough.         Patient has a cough but states that this is stable and has not had a recent fever chills or change in mucus production the  All other systems reviewed and are negative.    Allergies  Review of patient's allergies indicates no known allergies.  Home Medications   Current Outpatient Rx  Name  Route  Sig  Dispense  Refill  . metFORMIN (GLUCOPHAGE) 1000 MG tablet   Oral   Take 1,000 mg by mouth daily.          BP 144/91  Pulse 107  Temp(Src) 98.5 F (36.9 C) (Oral)  Resp 16  SpO2 98% Physical Exam  Nursing note and vitals reviewed. Constitutional: He is oriented to person, place, and time. He appears well-developed and well-nourished.  HENT:  Head: Normocephalic and atraumatic.  Right Ear: Tympanic membrane and external ear normal.  Left Ear: Tympanic membrane and external ear normal.  Nose: Nose normal. Right sinus exhibits no maxillary sinus tenderness and no frontal sinus tenderness. Left sinus exhibits no maxillary sinus tenderness and no frontal sinus tenderness.  Mouth/Throat: Oropharynx is clear and moist.  Eyes: Conjunctivae and EOM are normal. Pupils are equal, round, and reactive to light. Right eye exhibits no nystagmus. Left eye exhibits no nystagmus.  Neck: Normal range of motion. Neck supple.  Cardiovascular: Normal rate, regular  rhythm, normal heart sounds and intact distal pulses.   Pulmonary/Chest: Effort normal and breath sounds normal. No respiratory distress. He has no wheezes. He exhibits no tenderness.  Abdominal: Soft. Bowel sounds are normal. He exhibits no distension and no mass. There is no tenderness. There is no guarding.  Musculoskeletal: Normal range of motion. He exhibits no edema and no tenderness.  Neurological: He is alert and oriented to person, place, and time. He has normal strength and normal reflexes. No sensory deficit. He exhibits normal muscle tone. He displays a negative Romberg sign. Coordination normal. GCS eye subscore is 4. GCS verbal  subscore is 5. GCS motor subscore is 6.  Reflex Scores:      Tricep reflexes are 2+ on the right side and 2+ on the left side.      Bicep reflexes are 2+ on the right side and 2+ on the left side.      Brachioradialis reflexes are 2+ on the right side and 2+ on the left side.      Patellar reflexes are 2+ on the right side and 2+ on the left side.      Achilles reflexes are 2+ on the right side and 2+ on the left side. Patient with normal gait without ataxia, shuffling, spasm, or antalgia. Speech is normal without dysarthria, dysphasia, or aphasia. Muscle strength is 5/5 in bilateral shoulders, elbow flexor and extensors, wrist flexor and extensors, and intrinsic hand muscles. 5/5 bilateral lower extremity hip flexors, extensors, knee flexors and extensors, and ankle dorsi and plantar flexors.    Skin: Skin is warm and dry. No rash noted.  Psychiatric: He has a normal mood and affect. His behavior is normal. Judgment and thought content normal.    ED Course  Procedures (including critical care time) Labs Review Labs Reviewed  CBC WITH DIFFERENTIAL - Abnormal; Notable for the following:    WBC 12.3 (*)    Hemoglobin 17.2 (*)    Monocytes Absolute 1.3 (*)    All other components within normal limits  COMPREHENSIVE METABOLIC PANEL   Imaging Review No results found.  EKG Interpretation   None       MDM  No diagnosis found. Patient with mri pending.  Discussed with Dr. Armida Sans and he will see and evaluate.  Patient signed out to Dr. Cheri Guppy and care assumed.  11:54 PM     Shaune Pollack, MD 05/18/13 2354  I discussed with the patient that we would be waiting for results. He has become agitated and angry and states that he'll be leaving at 1:00. I have tried to counsel him that he should stay here and had a neurological evaluation and received the MRI results. He continues to be angry and states that he is sleeping at 1:00 but he could go out and smoke and would feel  better.  Shaune Pollack, MD 05/18/13 2358  I have offered the patient a nicotine patch - he refuses stating he is going out to smoke.  I have attempted to offer the patient alternatives but he is agitated and will not discuss options.    Shaune Pollack, MD 05/19/13 Dyann Kief

## 2013-05-18 NOTE — ED Notes (Signed)
Pt. Was in a car and was witnessed by bystanders pt. Was having a seizure like movement.  Eye lashes were moving, they layed him to the ground.  No visible injuries. Pt. Is alert and oriented X4.

## 2013-05-19 ENCOUNTER — Encounter (HOSPITAL_COMMUNITY): Payer: Self-pay | Admitting: Internal Medicine

## 2013-05-19 ENCOUNTER — Inpatient Hospital Stay (HOSPITAL_COMMUNITY): Payer: No Typology Code available for payment source

## 2013-05-19 DIAGNOSIS — K219 Gastro-esophageal reflux disease without esophagitis: Secondary | ICD-10-CM

## 2013-05-19 DIAGNOSIS — E785 Hyperlipidemia, unspecified: Secondary | ICD-10-CM

## 2013-05-19 DIAGNOSIS — R569 Unspecified convulsions: Secondary | ICD-10-CM | POA: Diagnosis present

## 2013-05-19 DIAGNOSIS — G9389 Other specified disorders of brain: Secondary | ICD-10-CM | POA: Diagnosis present

## 2013-05-19 DIAGNOSIS — IMO0001 Reserved for inherently not codable concepts without codable children: Secondary | ICD-10-CM

## 2013-05-19 DIAGNOSIS — E872 Acidosis, unspecified: Secondary | ICD-10-CM

## 2013-05-19 DIAGNOSIS — E1165 Type 2 diabetes mellitus with hyperglycemia: Secondary | ICD-10-CM

## 2013-05-19 HISTORY — DX: Unspecified convulsions: R56.9

## 2013-05-19 LAB — GLUCOSE, CAPILLARY
GLUCOSE-CAPILLARY: 255 mg/dL — AB (ref 70–99)
GLUCOSE-CAPILLARY: 247 mg/dL — AB (ref 70–99)
Glucose-Capillary: 257 mg/dL — ABNORMAL HIGH (ref 70–99)
Glucose-Capillary: 220 mg/dL — ABNORMAL HIGH (ref 70–99)

## 2013-05-19 LAB — HEMOGLOBIN A1C
HEMOGLOBIN A1C: 11.1 % — AB (ref <5.7)
MEAN PLASMA GLUCOSE: 272 mg/dL — AB (ref <117)

## 2013-05-19 LAB — PROTIME-INR
INR: 1.04 (ref 0.00–1.49)
Prothrombin Time: 13.4 seconds (ref 11.6–15.2)

## 2013-05-19 LAB — LACTIC ACID, PLASMA: LACTIC ACID, VENOUS: 2.1 mmol/L (ref 0.5–2.2)

## 2013-05-19 LAB — APTT: aPTT: 27 seconds (ref 24–37)

## 2013-05-19 LAB — MAGNESIUM: Magnesium: 1.9 mg/dL (ref 1.5–2.5)

## 2013-05-19 MED ORDER — ACETAMINOPHEN 650 MG RE SUPP: 650.0000 mg | RECTAL | Status: DC | PRN

## 2013-05-19 MED ORDER — SODIUM CHLORIDE 0.9 % IV SOLN
1000.0000 mg | Freq: Once | INTRAVENOUS | Status: AC
  Administered 2013-05-19: 1000 mg via INTRAVENOUS
  Filled 2013-05-19 (×2): qty 10

## 2013-05-19 MED ORDER — LEVETIRACETAM 500 MG PO TABS
500.0000 mg | ORAL_TABLET | Freq: Two times a day (BID) | ORAL | Status: DC
  Administered 2013-05-19 – 2013-05-23 (×9): 500 mg via ORAL
  Filled 2013-05-19 (×10): qty 1

## 2013-05-19 MED ORDER — LORAZEPAM 2 MG/ML IJ SOLN: 1.0000 mg | INTRAMUSCULAR | Status: DC | PRN

## 2013-05-19 MED ORDER — LORAZEPAM 2 MG/ML IJ SOLN
0.5000 mg | Freq: Once | INTRAMUSCULAR | Status: DC
  Filled 2013-05-19: qty 1

## 2013-05-19 MED ORDER — ACETAMINOPHEN 325 MG PO TABS: 650.0000 mg | ORAL_TABLET | ORAL | Status: DC | PRN

## 2013-05-19 MED ORDER — INSULIN ASPART 100 UNIT/ML ~~LOC~~ SOLN
0.0000 [IU] | Freq: Three times a day (TID) | SUBCUTANEOUS | Status: DC
  Administered 2013-05-20: 5 [IU] via SUBCUTANEOUS
  Administered 2013-05-20: 2 [IU] via SUBCUTANEOUS
  Administered 2013-05-20: 3 [IU] via SUBCUTANEOUS

## 2013-05-19 MED ORDER — SODIUM CHLORIDE 0.9 % IV SOLN
75.0000 mL/h | INTRAVENOUS | Status: AC
  Administered 2013-05-19: 75 mL/h via INTRAVENOUS

## 2013-05-19 MED ORDER — INSULIN GLARGINE 100 UNIT/ML ~~LOC~~ SOLN
5.0000 [IU] | Freq: Every day | SUBCUTANEOUS | Status: DC
  Administered 2013-05-19: 5 [IU] via SUBCUTANEOUS
  Filled 2013-05-19 (×2): qty 0.05

## 2013-05-19 MED ORDER — NICOTINE 14 MG/24HR TD PT24
14.0000 mg | MEDICATED_PATCH | Freq: Every day | TRANSDERMAL | Status: DC
  Filled 2013-05-19 (×7): qty 1

## 2013-05-19 MED ORDER — INSULIN ASPART 100 UNIT/ML ~~LOC~~ SOLN
0.0000 [IU] | Freq: Every day | SUBCUTANEOUS | Status: DC
  Administered 2013-05-19 – 2013-05-21 (×3): 3 [IU] via SUBCUTANEOUS
  Administered 2013-05-22: via SUBCUTANEOUS
  Administered 2013-05-23: 2 [IU] via SUBCUTANEOUS

## 2013-05-19 MED ORDER — LORAZEPAM 0.5 MG PO TABS: 0.5000 mg | ORAL_TABLET | Freq: Once | ORAL | Status: DC

## 2013-05-19 NOTE — H&P (Signed)
  Date: 05/19/2013  Patient name: Jonathan Barker  Medical record number: [id number]  Date of birth: Mar 08, 1966   I have seen and evaluated Jonathan Barker and discussed their care with the Residency Team. 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  Assessment and Plan: I have seen and evaluated the patient as outlined above. I agree with the formulated Assessment and Plan as detailed in the residents' admission note, with the following changes:   1. 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 He remains on sz precautions. We are awaiting neurosurgery input.   2. DM II poorly controlled - will need lantus and meal coverage.   3. Pre - op eval - He has one lee's criteria (insulin requiring DM) so peri-operative risk of CV complications is 1%. Has had EKG but results not in EPIC. CXR OK. It would be best if he were a non-smoker but per RN note, still smoking in hospital room.  Bartholomew Crews, MD 1/17/201512:32 PM

## 2013-05-19 NOTE — Consult Note (Addendum)
NEURO HOSPITALIST CONSULT NOTE    Reason for Consult: new onset seizure, abnormal CT brain  HPI:                                                                                                                                          Jonathan Barker is an 48 y.o. male, right handed, with a past medical history significant for DM, heavy smoking, hepatitis, GERD, brought to [facility] ED by ambulance after sustaining a witnessed seizure yesterday. He never had seizures before. Mr. Careaga daughters are at the bedside and they indicated that his father was standing up with his hands on his pockets when suddenly started blinking excessively, he squad down and then " kind of freeze" and had to sat down in the floor where he started shaking all over for less than a minute. No bladder or bowel incontinence, no tongue biting. Mr. Magallanes said that he recalls the entire episode. Denies HA, vertigo, double vision, focal weakness or numbness, imbalance, slurred speech, confusion, language or vision impairment.   No recent fever, infection, skin rash, foreign travel, or head trauma. CT brain revealed areas of ill-defined low attenuation in the medial aspect of the right frontal lobe and inferior aspect of the right frontal lobe.  A subsequent MRI brain with and without contrast showed an ill defined infiltrative mass within the anteromedial right frontal lobe without associated enhancement. There is abnormal FLAIR signal extending across the corpus  callosum into the contralateral subcortical left frontal lobe is suspicious for infiltrative tumor. No significant mass effect or midline shift.    Past Medical History  Diagnosis Date  . Shortness of breath   . Hepatitis   . DM (diabetes mellitus) 08/09/2011    A1c at diagnosis = 12.4  . GERD (gastroesophageal reflux disease) 08/09/2011    Past Surgical History  Procedure Laterality Date  . Appendectomy      Family History  Problem  Relation Age of Onset  . Breast cancer Mother   . Diabetes Father   . Heart disease Father   . Diabetes Paternal Uncle     Social History:  reports that he has been smoking Cigarettes.  He has been smoking about 1.00 pack per day. He has never used smokeless tobacco. He reports that he does not drink alcohol or use illicit drugs.  No Known Allergies  MEDICATIONS:  I have reviewed the patient's current medications.   ROS:                                                                                                                                       History obtained from the patient and family  General ROS: negative for - chills, fatigue, fever, night sweats, weight gain or weight loss Psychological ROS: negative for - behavioral disorder, hallucinations, memory difficulties, mood swings or suicidal ideation Ophthalmic ROS: negative for - blurry vision, double vision, eye pain or loss of vision ENT ROS: negative for - epistaxis, nasal discharge, oral lesions, sore throat, tinnitus or vertigo Allergy and Immunology ROS: negative for - hives or itchy/watery eyes Hematological and Lymphatic ROS: negative for - bleeding problems, bruising or swollen lymph nodes Endocrine ROS: negative for - galactorrhea, hair pattern changes, polydipsia/polyuria or temperature intolerance Respiratory ROS: negative for - cough, hemoptysis, shortness of breath or wheezing Cardiovascular ROS: negative for - chest pain, dyspnea on exertion, edema or irregular heartbeat Gastrointestinal ROS: negative for - abdominal pain, diarrhea, hematemesis, nausea/vomiting or stool incontinence Genito-Urinary ROS: negative for - dysuria, hematuria, incontinence or urinary frequency/urgency Musculoskeletal ROS: negative for - joint swelling or muscular weakness Neurological ROS: as noted in HPI Dermatological  ROS: negative for rash and skin lesion changes  Physical exam: pleasant male in no apparent distress. Blood pressure 126/86, pulse 94, temperature 98.5 F (36.9 C), temperature source Oral, resp. rate 16, SpO2 97.00%. Head: normocephalic. Neck: supple, no bruits, no JVD. Cardiac: no murmurs. Lungs: clear. Abdomen: soft, no tender, no mass. Extremities: no edema.  Neurologic Examination:                                                                                                      Mental Status: Alert, oriented x 4, thought content appropriate.  Comprehension, naming, and repetition intact. Speech fluent without evidence of aphasia.  Able to follow 3 step commands without difficulty. Cranial Nerves: II: Discs flat bilaterally; Visual fields grossly normal, pupils equal, round, reactive to light and accommodation III,IV, VI: ptosis not present, extra-ocular motions intact bilaterally V,VII: smile symmetric, facial light touch sensation normal bilaterally VIII: hearing normal bilaterally IX,X: gag reflex present XI: bilateral shoulder shrug XII: midline tongue extension without atrophy or fasciculations  Motor: Right : Upper extremity   5/5    Left:     Upper extremity   5/5  Lower extremity   5/5     Lower extremity  5/5 Tone and bulk:normal tone throughout; no atrophy noted Sensory: Pinprick and light touch intact throughout, bilaterally Deep Tendon Reflexes:  Right: Upper Extremity   Left: Upper extremity   biceps (C-5 to C-6) 2/4   biceps (C-5 to C-6) 2/4 tricep (C7) 2/4    triceps (C7) 2/4 Brachioradialis (C6) 2/4  Brachioradialis (C6) 2/4  Lower Extremity Lower Extremity  quadriceps (L-2 to L-4) 2/4   quadriceps (L-2 to L-4) 2/4 Achilles (S1) 2/4   Achilles (S1) 2/4  Plantars: Right: downgoing   Left: downgoing Cerebellar: normal finger-to-nose,  normal heel-to-shin test Gait:  No ataxia. CV: pulses palpable throughout    Lab Results  Component Value  Date/Time   CHOL 184 01/21/2012  2:53 PM    Results for orders placed during the hospital encounter of 05/18/13 (from the past 48 hour(s))  CBC WITH DIFFERENTIAL     Status: Abnormal   Collection Time    05/18/13  7:08 PM      Result Value Range   WBC 12.3 (*) 4.0 - 10.5 K/uL   RBC 5.33  4.22 - 5.81 MIL/uL   Hemoglobin 17.2 (*) 13.0 - 17.0 g/dL   HCT 47.8  39.0 - 52.0 %   MCV 89.7  78.0 - 100.0 fL   MCH 32.3  26.0 - 34.0 pg   MCHC 36.0  30.0 - 36.0 g/dL   RDW 12.2  11.5 - 15.5 %   Platelets 309  150 - 400 K/uL   Neutrophils Relative % 62  43 - 77 %   Neutro Abs 7.6  1.7 - 7.7 K/uL   Lymphocytes Relative 25  12 - 46 %   Lymphs Abs 3.0  0.7 - 4.0 K/uL   Monocytes Relative 11  3 - 12 %   Monocytes Absolute 1.3 (*) 0.1 - 1.0 K/uL   Eosinophils Relative 2  0 - 5 %   Eosinophils Absolute 0.3  0.0 - 0.7 K/uL   Basophils Relative 1  0 - 1 %   Basophils Absolute 0.1  0.0 - 0.1 K/uL  COMPREHENSIVE METABOLIC PANEL     Status: Abnormal   Collection Time    05/18/13  7:08 PM      Result Value Range   Sodium 139  137 - 147 mEq/L   Potassium 4.1  3.7 - 5.3 mEq/L   Chloride 101  96 - 112 mEq/L   CO2 22  19 - 32 mEq/L   Glucose, Bld 227 (*) 70 - 99 mg/dL   BUN 14  6 - 23 mg/dL   Creatinine, Ser 0.77  0.50 - 1.35 mg/dL   Calcium 10.1  8.4 - 10.5 mg/dL   Total Protein 7.5  6.0 - 8.3 g/dL   Albumin 3.9  3.5 - 5.2 g/dL   AST 24  0 - 37 U/L   ALT 28  0 - 53 U/L   Alkaline Phosphatase 89  39 - 117 U/L   Total Bilirubin 0.2 (*) 0.3 - 1.2 mg/dL   GFR calc non Af Amer >90  >90 mL/min   GFR calc Af Amer >90  >90 mL/min   Comment: (NOTE)     The eGFR has been calculated using the CKD EPI equation.     This calculation has not been validated in all clinical situations.     eGFR's persistently <90 mL/min signify possible Chronic Kidney     Disease.  URINE RAPID DRUG SCREEN (HOSP PERFORMED)     Status: Abnormal  Collection Time    05/18/13  7:53 PM      Result Value Range   Opiates NONE  DETECTED  NONE DETECTED   Cocaine NONE DETECTED  NONE DETECTED   Benzodiazepines NONE DETECTED  NONE DETECTED   Amphetamines NONE DETECTED  NONE DETECTED   Tetrahydrocannabinol POSITIVE (*) NONE DETECTED   Barbiturates NONE DETECTED  NONE DETECTED   Comment:            DRUG SCREEN FOR MEDICAL PURPOSES     ONLY.  IF CONFIRMATION IS NEEDED     FOR ANY PURPOSE, NOTIFY LAB     WITHIN 5 DAYS.                LOWEST DETECTABLE LIMITS     FOR URINE DRUG SCREEN     Drug Class       Cutoff (ng/mL)     Amphetamine      1000     Barbiturate      200     Benzodiazepine   163     Tricyclics       845     Opiates          300     Cocaine          300     THC              50  URINALYSIS, ROUTINE W REFLEX MICROSCOPIC     Status: Abnormal   Collection Time    05/18/13  7:53 PM      Result Value Range   Color, Urine YELLOW  YELLOW   APPearance CLEAR  CLEAR   Specific Gravity, Urine 1.034 (*) 1.005 - 1.030   pH 6.5  5.0 - 8.0   Glucose, UA >1000 (*) NEGATIVE mg/dL   Hgb urine dipstick NEGATIVE  NEGATIVE   Bilirubin Urine NEGATIVE  NEGATIVE   Ketones, ur NEGATIVE  NEGATIVE mg/dL   Protein, ur NEGATIVE  NEGATIVE mg/dL   Urobilinogen, UA 1.0  0.0 - 1.0 mg/dL   Nitrite NEGATIVE  NEGATIVE   Leukocytes, UA NEGATIVE  NEGATIVE  URINE MICROSCOPIC-ADD ON     Status: None   Collection Time    05/18/13  7:53 PM      Result Value Range   Squamous Epithelial / LPF RARE  RARE   WBC, UA 0-2  <3 WBC/hpf  GLUCOSE, CAPILLARY     Status: Abnormal   Collection Time    05/18/13  9:55 PM      Result Value Range   Glucose-Capillary 216 (*) 70 - 99 mg/dL    Ct Head Wo Contrast  05/18/2013   CLINICAL DATA:  Seizure like activity.  EXAM: CT HEAD WITHOUT CONTRAST  TECHNIQUE: Contiguous axial images were obtained from the base of the skull through the vertex without intravenous contrast.  COMPARISON:  No priors.  FINDINGS: Slightly ill-defined area of low attenuation in the medial aspect of the right frontal lobe  best demonstrated on image 19 of series 2. In addition, in the inferior aspect of the right frontal lobe (image 11 of series 2) there is a larger area of ill-defined low-attenuation. No definite acute intracranial hemorrhage, significant mass effect, hydrocephalus or abnormal intra or extra-axial fluid collections are noted. Visualized paranasal sinuses and mastoids are well pneumatized, with exception of some very mild mucosal thickening in the ethmoid sinuses bilaterally and the right maxillary sinus. No acute displaced skull fractures are identified.  IMPRESSION: 1. Areas  of ill-defined low attenuation in the medial aspect of the right frontal lobe and inferior aspect of the right frontal lobe. These could represent areas of subacute ischemia, however, the possibility of areas of edema related to underlying mass lesions is not excluded. Further characterization of these findings with followup MRI of the brain with and without IV gadolinium is strongly recommended. These results were called by telephone at the time of interpretation on 05/18/2013 at 7:52 PM to Dr. Pattricia Boss, who verbally acknowledged these results.   Electronically Signed   By: Vinnie Langton M.D.   On: 05/18/2013 19:57   [name] BH Contrast  05/19/2013   CLINICAL DATA:  New onset seizure  EXAM: MRI HEAD WITHOUT AND WITH CONTRAST  TECHNIQUE: Multiplanar, multiecho pulse sequences of the brain and surrounding structures were obtained without and with intravenous contrast.  CONTRAST:  96m MULTIHANCE GADOBENATE DIMEGLUMINE 529 MG/ML IV SOLN  COMPARISON:  Prior CT from 05/18/2013  FINDINGS: An ill-defined infiltrating masslike region is seen within the anteromedial right frontal lobe (series 5, image 20). This region demonstrates fairly homogeneous hyperintense T2/FLAIR signal with. This region demonstrates fairly homogeneous hyperintense T2/FLAIR signal with hypo intense T1 signal intensity without post-contrast enhancement. There is  expansion and thickening of the overlying cortex within the anteromedial right frontal lobe. Abnormal FLAIR signal extends across the corpus callosum in involves the contralateral subcortical white matter of the left frontal lobe. Hyperintense FLAIR signal also extends inferiorly into the inferior right frontal lobe. There is no significant mass effect or midline shift. 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  No other mass lesions identified. Ventricles are normal in size without evidence of hydrocephalus. There is no extra-axial fluid collection.  No diffusion-weighted signal abnormality is identified to suggest acute intracranial infarct. Normal flow voids seen within the intracranial vasculature.  Cervicomedullary junction is normal. The globes demonstrate a normal appearance. Optic nerves are normal. Pituitary gland is within normal limits.  No abnormal enhancement identified within the brain.  Paranasal sinuses and mastoid air cells are clear.  Calvarium demonstrates a normal appearance with normal signal intensity. Visualized upper cervical spine within normal limits.  IMPRESSION: Ill-defined infiltrative mass within the anteromedial right frontal lobe without associated enhancement, worrisome for primary low grade astrocytoma. Abnormal FLAIR signal extending across the corpus callosum into the contralateral subcortical left frontal lobe is suspicious for infiltrative tumor. No significant mass effect or midline shift.   Electronically Signed   By: BJeannine BogaM.D.   On: 05/19/2013 00:07   Dg Chest Port 1 View  05/18/2013   CLINICAL DATA:  cough  EXAM: PORTABLE CHEST - 1 VIEW  COMPARISON:  DG CHEST 2 VIEW dated 07/18/2011  FINDINGS: Normal mediastinum and cardiac silhouette. Normal pulmonary vasculature. No evidence of effusion, infiltrate, or pneumothorax. No acute bony abnormality.  IMPRESSION: No acute cardiopulmonary process.   Electronically Signed   By: SSuzy BouchardM.D.   On: 05/18/2013 19:52    Assessment/Plan: 48y.o. male, right handed, with a past medical history significant for DM, heavy smoking, brought in due to new onset GTC seizure. MRI brain with and without contrast revealed an ill defined, non enhancing infiltrative mass within the anteromedial right frontal lobe.There is abnormal FLAIR signal extending across the corpus callosum into the contralateral subcortical left frontal lobe is suspicious for infiltrative tumor such as a primary low grade astrocytoma. No significant mass effect or midline shift. Recommend: 1) Admit to medicine. 2) Load  with 1 gram IV keppra now and continue 500 mg PO BID. 3) No significant vasogenic edema/mass effect and thus will defer steroids at this moment. 4) EEG 5) Neurosurgery consult in am. Will follow up.  Dorian Pod, MD 05/19/2013, 12:22 AM

## 2013-05-19 NOTE — Consult Note (Signed)
Reason for Consult:brein lesion Referring Physician: hospitalist  Jonathan Barker is an 48 y.o. male.  HPI: patient admitted after having a seizure . Ct plus mri of the brain was done.  At present he has no complains and wants to go home   Past Medical History  Diagnosis Date  . Shortness of breath   . Hepatitis   . DM (diabetes mellitus) 08/09/2011    A1c at diagnosis = 12.4  . GERD (gastroesophageal reflux disease) 08/09/2011  . Seizure 05/19/2013    Past Surgical History  Procedure Laterality Date  . Appendectomy      Family History  Problem Relation Age of Onset  . Breast cancer Mother   . Diabetes Father   . Heart disease Father   . Diabetes Paternal Uncle   . Diabetes Brother     Social History:  reports that he has been smoking Cigarettes.  He has been smoking about 1.00 pack per day. He has never used smokeless tobacco. He reports that he uses illicit drugs. He reports that he does not drink alcohol.  Allergies: No Known Allergies  Medications: see chart  Results for orders placed during the hospital encounter of 05/18/13 (from the past 48 hour(s))  CBC WITH DIFFERENTIAL     Status: Abnormal   Collection Time    05/18/13  7:08 PM      Result Value Range   WBC 12.3 (*) 4.0 - 10.5 K/uL   RBC 5.33  4.22 - 5.81 MIL/uL   Hemoglobin 17.2 (*) 13.0 - 17.0 g/dL   HCT 47.8  39.0 - 52.0 %   MCV 89.7  78.0 - 100.0 fL   MCH 32.3  26.0 - 34.0 pg   MCHC 36.0  30.0 - 36.0 g/dL   RDW 12.2  11.5 - 15.5 %   Platelets 309  150 - 400 K/uL   Neutrophils Relative % 62  43 - 77 %   Neutro Abs 7.6  1.7 - 7.7 K/uL   Lymphocytes Relative 25  12 - 46 %   Lymphs Abs 3.0  0.7 - 4.0 K/uL   Monocytes Relative 11  3 - 12 %   Monocytes Absolute 1.3 (*) 0.1 - 1.0 K/uL   Eosinophils Relative 2  0 - 5 %   Eosinophils Absolute 0.3  0.0 - 0.7 K/uL   Basophils Relative 1  0 - 1 %   Basophils Absolute 0.1  0.0 - 0.1 K/uL  COMPREHENSIVE METABOLIC PANEL     Status: Abnormal   Collection Time     05/18/13  7:08 PM      Result Value Range   Sodium 139  137 - 147 mEq/L   Potassium 4.1  3.7 - 5.3 mEq/L   Chloride 101  96 - 112 mEq/L   CO2 22  19 - 32 mEq/L   Glucose, Bld 227 (*) 70 - 99 mg/dL   BUN 14  6 - 23 mg/dL   Creatinine, Ser 0.77  0.50 - 1.35 mg/dL   Calcium 10.1  8.4 - 10.5 mg/dL   Total Protein 7.5  6.0 - 8.3 g/dL   Albumin 3.9  3.5 - 5.2 g/dL   AST 24  0 - 37 U/L   ALT 28  0 - 53 U/L   Alkaline Phosphatase 89  39 - 117 U/L   Total Bilirubin 0.2 (*) 0.3 - 1.2 mg/dL   GFR calc non Af Amer >90  >90 mL/min   GFR calc Af Amer >  90  >90 mL/min   Comment: (NOTE)     The eGFR has been calculated using the CKD EPI equation.     This calculation has not been validated in all clinical situations.     eGFR's persistently <90 mL/min signify possible Chronic Kidney     Disease.  URINE RAPID DRUG SCREEN (HOSP PERFORMED)     Status: Abnormal   Collection Time    05/18/13  7:53 PM      Result Value Range   Opiates NONE DETECTED  NONE DETECTED   Cocaine NONE DETECTED  NONE DETECTED   Benzodiazepines NONE DETECTED  NONE DETECTED   Amphetamines NONE DETECTED  NONE DETECTED   Tetrahydrocannabinol POSITIVE (*) NONE DETECTED   Barbiturates NONE DETECTED  NONE DETECTED   Comment:            DRUG SCREEN FOR MEDICAL PURPOSES     ONLY.  IF CONFIRMATION IS NEEDED     FOR ANY PURPOSE, NOTIFY LAB     WITHIN 5 DAYS.                LOWEST DETECTABLE LIMITS     FOR URINE DRUG SCREEN     Drug Class       Cutoff (ng/mL)     Amphetamine      1000     Barbiturate      200     Benzodiazepine   414     Tricyclics       239     Opiates          300     Cocaine          300     THC              50  URINALYSIS, ROUTINE W REFLEX MICROSCOPIC     Status: Abnormal   Collection Time    05/18/13  7:53 PM      Result Value Range   Color, Urine YELLOW  YELLOW   APPearance CLEAR  CLEAR   Specific Gravity, Urine 1.034 (*) 1.005 - 1.030   pH 6.5  5.0 - 8.0   Glucose, UA >1000 (*) NEGATIVE  mg/dL   Hgb urine dipstick NEGATIVE  NEGATIVE   Bilirubin Urine NEGATIVE  NEGATIVE   Ketones, ur NEGATIVE  NEGATIVE mg/dL   Protein, ur NEGATIVE  NEGATIVE mg/dL   Urobilinogen, UA 1.0  0.0 - 1.0 mg/dL   Nitrite NEGATIVE  NEGATIVE   Leukocytes, UA NEGATIVE  NEGATIVE  URINE MICROSCOPIC-ADD ON     Status: None   Collection Time    05/18/13  7:53 PM      Result Value Range   Squamous Epithelial / LPF RARE  RARE   WBC, UA 0-2  <3 WBC/hpf  GLUCOSE, CAPILLARY     Status: Abnormal   Collection Time    05/18/13  9:55 PM      Result Value Range   Glucose-Capillary 216 (*) 70 - 99 mg/dL  MAGNESIUM     Status: None   Collection Time    05/19/13  3:30 AM      Result Value Range   Magnesium 1.9  1.5 - 2.5 mg/dL  PROTIME-INR     Status: None   Collection Time    05/19/13  3:30 AM      Result Value Range   Prothrombin Time 13.4  11.6 - 15.2 seconds   INR 1.04  0.00 - 1.49  APTT  Status: None   Collection Time    05/19/13  3:30 AM      Result Value Range   aPTT 27  24 - 37 seconds  GLUCOSE, CAPILLARY     Status: Abnormal   Collection Time    05/19/13  3:56 AM      Result Value Range   Glucose-Capillary 257 (*) 70 - 99 mg/dL   Comment 1 Notify RN      Ct Head Wo Contrast  05/18/2013   CLINICAL DATA:  Seizure like activity.  EXAM: CT HEAD WITHOUT CONTRAST  TECHNIQUE: Contiguous axial images were obtained from the base of the skull through the vertex without intravenous contrast.  COMPARISON:  No priors.  FINDINGS: Slightly ill-defined area of low attenuation in the medial aspect of the right frontal lobe best demonstrated on image 19 of series 2. In addition, in the inferior aspect of the right frontal lobe (image 11 of series 2) there is a larger area of ill-defined low-attenuation. No definite acute intracranial hemorrhage, significant mass effect, hydrocephalus or abnormal intra or extra-axial fluid collections are noted. Visualized paranasal sinuses and mastoids are well pneumatized,  with exception of some very mild mucosal thickening in the ethmoid sinuses bilaterally and the right maxillary sinus. No acute displaced skull fractures are identified.  IMPRESSION: 1. Areas of ill-defined low attenuation in the medial aspect of the right frontal lobe and inferior aspect of the right frontal lobe. These could represent areas of subacute ischemia, however, the possibility of areas of edema related to underlying mass lesions is not excluded. Further characterization of these findings with followup MRI of the brain with and without IV gadolinium is strongly recommended. These results were called by telephone at the time of interpretation on 05/18/2013 at 7:52 PM to Dr. Pattricia Boss, who verbally acknowledged these results.   Electronically Signed   By: Vinnie Langton M.D.   On: 05/18/2013 19:57   [name] EX Contrast  05/19/2013   CLINICAL DATA:  New onset seizure  EXAM: MRI HEAD WITHOUT AND WITH CONTRAST  TECHNIQUE: Multiplanar, multiecho pulse sequences of the brain and surrounding structures were obtained without and with intravenous contrast.  CONTRAST:  30m MULTIHANCE GADOBENATE DIMEGLUMINE 529 MG/ML IV SOLN  COMPARISON:  Prior CT from 05/18/2013  FINDINGS: An ill-defined infiltrating masslike region is seen within the anteromedial right frontal lobe (series 5, image 20). This region demonstrates fairly homogeneous hyperintense T2/FLAIR signal with. This region demonstrates fairly homogeneous hyperintense T2/FLAIR signal with hypo intense T1 signal intensity without post-contrast enhancement. There is expansion and thickening of the overlying cortex within the anteromedial right frontal lobe. Abnormal FLAIR signal extends across the corpus callosum in involves the contralateral subcortical white matter of the left frontal lobe. Hyperintense FLAIR signal also extends inferiorly into the inferior right frontal lobe. There is no significant mass effect or midline shift. 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  No other mass lesions identified. Ventricles are normal in size without evidence of hydrocephalus. There is no extra-axial fluid collection.  No diffusion-weighted signal abnormality is identified to suggest acute intracranial infarct. Normal flow voids seen within the intracranial vasculature.  Cervicomedullary junction is normal. The globes demonstrate a normal appearance. Optic nerves are normal. Pituitary gland is within normal limits.  No abnormal enhancement identified within the brain.  Paranasal sinuses and mastoid air cells are clear.  Calvarium demonstrates a normal appearance with normal signal intensity. Visualized upper cervical spine within normal limits.  IMPRESSION: Ill-defined infiltrative mass within the anteromedial right frontal lobe without associated enhancement, worrisome for primary low grade astrocytoma. Abnormal FLAIR signal extending across the corpus callosum into the contralateral subcortical left frontal lobe is suspicious for infiltrative tumor. No significant mass effect or midline shift.   Electronically Signed   By: Jeannine Boga M.D.   On: 05/19/2013 00:07   Dg Chest Port 1 View  05/18/2013   CLINICAL DATA:  cough  EXAM: PORTABLE CHEST - 1 VIEW  COMPARISON:  DG CHEST 2 VIEW dated 07/18/2011  FINDINGS: Normal mediastinum and cardiac silhouette. Normal pulmonary vasculature. No evidence of effusion, infiltrate, or pneumothorax. No acute bony abnormality.  IMPRESSION: No acute cardiopulmonary process.   Electronically Signed   By: Suzy Bouchard M.D.   On: 05/18/2013 19:52    Review of Systems  Constitutional: Negative.   HENT: Negative.   Eyes: Negative.   Respiratory: Negative.   Cardiovascular: Negative.   Musculoskeletal: Negative.   Skin: Negative.   Neurological: Positive for seizures.  Psychiatric/Behavioral: Negative.    Blood pressure 118/76, pulse 85, temperature 97.8 F (36.6 C), temperature source  Oral, resp. rate 16, height 5' 11"  (1.803 m), weight 83.915 kg (185 lb), SpO2 98.00%. Physical Exam hent, nl. Neck, nl. Cv, nl. Lungs some rales. Abdomen, soft. Extremities, nl. NEURO he is intact. No evidence of weakness. Mri show a lesion in the right frontal area most likely a low grade tumor but with the possibility on infection  Assessment/Plan: i did talk to him and his family members. He needs a brain biopsy to find what we are dealing with. He wants to go home but i did advise him against. He needs to be at the hospital with seizures precaution  Avanna Sowder M 05/19/2013, 11:33 AM

## 2013-05-19 NOTE — Progress Notes (Signed)
EEG Completed; Results Pending  

## 2013-05-19 NOTE — ED Provider Notes (Addendum)
CSN: [id number]     Arrival date & time 05/18/13  1835 History   First MD Initiated Contact with Patient 05/18/13 1858     Chief Complaint  Patient presents with  . Seizures   (Consider location/radiation/quality/duration/timing/severity/associated sxs/prior Treatment) HPI  Past Medical History  Diagnosis Date  . Shortness of breath   . Hepatitis   . DM (diabetes mellitus) 08/09/2011    A1c at diagnosis = 12.4  . GERD (gastroesophageal reflux disease) 08/09/2011   Past Surgical History  Procedure Laterality Date  . Appendectomy     Family History  Problem Relation Age of Onset  . Breast cancer Mother   . Diabetes Father   . Heart disease Father   . Diabetes Paternal Uncle    History  Substance Use Topics  . Smoking status: Current Every Day Smoker -- 1.00 packs/day    Types: Cigarettes  . Smokeless tobacco: Never Used     Comment: cutting back  . Alcohol Use: No    Review of Systems  Allergies  Review of patient's allergies indicates no known allergies.  Home Medications   Current Outpatient Rx  Name  Route  Sig  Dispense  Refill  . metFORMIN (GLUCOPHAGE) 1000 MG tablet   Oral   Take 1,000 mg by mouth daily.          BP 126/86  Pulse 94  Temp(Src) 98.5 F (36.9 C) (Oral)  Resp 16  SpO2 97% Physical Exam  ED Course  Procedures (including critical care time) Labs Review Labs Reviewed  CBC WITH DIFFERENTIAL - Abnormal; Notable for the following:    WBC 12.3 (*)    Hemoglobin 17.2 (*)    Monocytes Absolute 1.3 (*)    All other components within normal limits  COMPREHENSIVE METABOLIC PANEL - Abnormal; Notable for the following:    Glucose, Bld 227 (*)    Total Bilirubin 0.2 (*)    All other components within normal limits  URINE RAPID DRUG SCREEN (HOSP PERFORMED) - Abnormal; Notable for the following:    Tetrahydrocannabinol POSITIVE (*)    All other components within normal limits  URINALYSIS, ROUTINE W REFLEX MICROSCOPIC - Abnormal; Notable for  the following:    Specific Gravity, Urine 1.034 (*)    Glucose, UA >1000 (*)    All other components within normal limits  GLUCOSE, CAPILLARY - Abnormal; Notable for the following:    Glucose-Capillary 216 (*)    All other components within normal limits  URINE MICROSCOPIC-ADD ON   Imaging Review Ct Head Wo Contrast  05/18/2013   CLINICAL DATA:  Seizure like activity.  EXAM: CT HEAD WITHOUT CONTRAST  TECHNIQUE: Contiguous axial images were obtained from the base of the skull through the vertex without intravenous contrast.  COMPARISON:  No priors.  FINDINGS: Slightly ill-defined area of low attenuation in the medial aspect of the right frontal lobe best demonstrated on image 19 of series 2. In addition, in the inferior aspect of the right frontal lobe (image 11 of series 2) there is a larger area of ill-defined low-attenuation. No definite acute intracranial hemorrhage, significant mass effect, hydrocephalus or abnormal intra or extra-axial fluid collections are noted. Visualized paranasal sinuses and mastoids are well pneumatized, with exception of some very mild mucosal thickening in the ethmoid sinuses bilaterally and the right maxillary sinus. No acute displaced skull fractures are identified.  IMPRESSION: 1. Areas of ill-defined low attenuation in the medial aspect of the right frontal lobe and inferior aspect of the right  frontal lobe. These could represent areas of subacute ischemia, however, the possibility of areas of edema related to underlying mass lesions is not excluded. Further characterization of these findings with followup MRI of the brain with and without IV gadolinium is strongly recommended. These results were called by telephone at the time of interpretation on 05/18/2013 at 7:52 PM to Dr. Pattricia Boss, who verbally acknowledged these results.   Electronically Signed   By: Vinnie Langton M.D.   On: 05/18/2013 19:57   [name] ZO Contrast  05/19/2013   CLINICAL DATA:  New onset  seizure  EXAM: MRI HEAD WITHOUT AND WITH CONTRAST  TECHNIQUE: Multiplanar, multiecho pulse sequences of the brain and surrounding structures were obtained without and with intravenous contrast.  CONTRAST:  54mL MULTIHANCE GADOBENATE DIMEGLUMINE 529 MG/ML IV SOLN  COMPARISON:  Prior CT from 05/18/2013  FINDINGS: An ill-defined infiltrating masslike region is seen within the anteromedial right frontal lobe (series 5, image 20). This region demonstrates fairly homogeneous hyperintense T2/FLAIR signal with. This region demonstrates fairly homogeneous hyperintense T2/FLAIR signal with hypo intense T1 signal intensity without post-contrast enhancement. There is expansion and thickening of the overlying cortex within the anteromedial right frontal lobe. Abnormal FLAIR signal extends across the corpus callosum in involves the contralateral subcortical white matter of the left frontal lobe. Hyperintense FLAIR signal also extends inferiorly into the inferior right frontal lobe. There is no significant mass effect or midline shift. 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  No other mass lesions identified. Ventricles are normal in size without evidence of hydrocephalus. There is no extra-axial fluid collection.  No diffusion-weighted signal abnormality is identified to suggest acute intracranial infarct. Normal flow voids seen within the intracranial vasculature.  Cervicomedullary junction is normal. The globes demonstrate a normal appearance. Optic nerves are normal. Pituitary gland is within normal limits.  No abnormal enhancement identified within the brain.  Paranasal sinuses and mastoid air cells are clear.  Calvarium demonstrates a normal appearance with normal signal intensity. Visualized upper cervical spine within normal limits.  IMPRESSION: Ill-defined infiltrative mass within the anteromedial right frontal lobe without associated enhancement, worrisome for primary low grade astrocytoma.  Abnormal FLAIR signal extending across the corpus callosum into the contralateral subcortical left frontal lobe is suspicious for infiltrative tumor. No significant mass effect or midline shift.   Electronically Signed   By: Jeannine Boga M.D.   On: 05/19/2013 00:07   Dg Chest Port 1 View  05/18/2013   CLINICAL DATA:  cough  EXAM: PORTABLE CHEST - 1 VIEW  COMPARISON:  DG CHEST 2 VIEW dated 07/18/2011  FINDINGS: Normal mediastinum and cardiac silhouette. Normal pulmonary vasculature. No evidence of effusion, infiltrate, or pneumothorax. No acute bony abnormality.  IMPRESSION: No acute cardiopulmonary process.   Electronically Signed   By: Suzy Bouchard M.D.   On: 05/18/2013 19:52    EKG Interpretation   None      MR Brain W Wo Contrast (Final result)  Result time: 05/19/13 00:07:52    Final result by Rad Results In Interface (05/19/13 00:07:52)    Narrative:   CLINICAL DATA: New onset seizure  EXAM: MRI HEAD WITHOUT AND WITH CONTRAST  TECHNIQUE: Multiplanar, multiecho pulse sequences of the brain and surrounding structures were obtained without and with intravenous contrast.  CONTRAST: 63mL MULTIHANCE GADOBENATE DIMEGLUMINE 529 MG/ML IV SOLN  COMPARISON: Prior CT from 05/18/2013  FINDINGS: An ill-defined infiltrating masslike region is seen within the anteromedial right frontal lobe (series 5,  image 20). This region demonstrates fairly homogeneous hyperintense T2/FLAIR signal with. This region demonstrates fairly homogeneous hyperintense T2/FLAIR signal with hypo intense T1 signal intensity without post-contrast enhancement. There is expansion and thickening of the overlying cortex within the anteromedial right frontal lobe. Abnormal FLAIR signal extends across the corpus callosum in involves the contralateral subcortical white matter of the left frontal lobe. Hyperintense FLAIR signal also extends inferiorly into the inferior right frontal lobe. There is no  significant mass effect or midline shift. 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  No other mass lesions identified. Ventricles are normal in size without evidence of hydrocephalus. There is no extra-axial fluid collection.  No diffusion-weighted signal abnormality is identified to suggest acute intracranial infarct. Normal flow voids seen within the intracranial vasculature.  Cervicomedullary junction is normal. The globes demonstrate a normal appearance. Optic nerves are normal. Pituitary gland is within normal limits.  No abnormal enhancement identified within the brain.  Paranasal sinuses and mastoid air cells are clear.  Calvarium demonstrates a normal appearance with normal signal intensity. Visualized upper cervical spine within normal limits.  IMPRESSION: Ill-defined infiltrative mass within the anteromedial right frontal lobe without associated enhancement, worrisome for primary low grade astrocytoma. Abnormal FLAIR signal extending across the corpus callosum into the contralateral subcortical left frontal lobe is suspicious for infiltrative tumor. No significant mass effect or midline shift.     MDM   I assumed care of this patient from Dr. Jeanell Sparrow at change of shift. New onset seizure activity in middle age with no history of same. Abnormal MRI is concerning for infiltrative malingnancy. Patient examined by Neuro-hospitalist, Dr. Justin Mend, who recommends that the patient be admitted to the [facility] service with plan for further inpatient work up. He recommends IV load with 1000mg  Keppra which I have ordered.     Elyn Peers, MD 05/19/13 [id number]  Elyn Peers, MD 05/19/13 [phone]

## 2013-05-19 NOTE — Progress Notes (Signed)
Cigarette smoke in the pt room, strongest in the bathroom. Initially denied first that pt was smoking but acknowledged a minute later . " pt states " I've got to have cigarette". Had cautioned pt possible outcomes if his behavior continues, including calling security to speak with patient. Pt wants to go out/home. Dr. Mechele Claude updated, including pt request for diet and activity . Thanks.

## 2013-05-19 NOTE — Procedures (Signed)
ELECTROENCEPHALOGRAM REPORT   Patient: Jonathan Barker       Room #: 8I69 EEG No. ID: [id number] Age: 48 y.o.        Sex: male Referring Physician: Joines Report Date:  05/19/2013        Interpreting Physician: Alexis Goodell D  History: Jonathan Barker is an 48 y.o. male with new onset seizures  Medications:  Scheduled: . insulin aspart  0-5 Units Subcutaneous QHS  . insulin aspart  0-9 Units Subcutaneous TID WC  . levETIRAcetam  500 mg Oral BID    Conditions of Recording:  This is a 16 channel EEG carried out with the patient in the drowsy and asleep states.  Description:  The patient does not achieve wakefulness during the tracing and therefore a waking background activity can not be evaluated.   The patient drowses during the tracing with a slow and poorly organized background rhythm consisting mostly of theta and delta activity.     The patient goes in to a light sleep with symmetrical sleep spindles, vertex central sharp transients and irregular slow activity. Hyperventilation was not performed.  Intermittent photic stimulation was performed but failed to illicit any change in the tracing.   IMPRESSION: This is a normal drowsy and asleep electroencephalogram.  No epileptiform activity is noted.     Alexis Goodell, MD [facility] [phone] 05/19/2013, 11:36 AM

## 2013-05-19 NOTE — Progress Notes (Addendum)
Subjective: Patient feels fine this morning. No complaints. No seizure-like activity last night. However, he has a lot of questions regarding his current medical problems, which is understandable. He is also anxious to go home. We discussed that it would be best for him to stay to obtain some answers and at least see the neurosurgeon first.  Objective: Vital signs in last 24 hours: Filed Vitals:   05/19/13 0030 05/19/13 0130 05/19/13 0230 05/19/13 0354  BP: 143/81 118/87 138/88 118/76  Pulse: 88 93 95 85  Temp:    97.8 F (36.6 C)  TempSrc:    Oral  Resp:    16  Height:    5\' 11"  (1.803 m)  Weight:    185 lb (83.915 kg)  SpO2: 97% 95% 96% 98%   Weight change:   Intake/Output Summary (Last 24 hours) at 05/19/13 1222 Last data filed at 05/19/13 0900  Gross per 24 hour  Intake      0 ml  Output      0 ml  Net      0 ml   Physical exam: General: alert, cooperative, and in no apparent distress; lying in bed; appears somewhat unkempt  HEENT: pupils equal round and reactive to light, vision grossly intact, oropharynx clear and non-erythematous; no nystagmus noted Neck: supple Lungs: clear to ascultation bilaterally, normal work of respiration, no wheezes, rales, ronchi Heart: regular rate and rhythm, no murmurs, gallops, or rubs Abdomen: soft, non-tender, non-distended, normal bowel sounds Extremities: warm, no BLE edema Neurologic: alert & oriented X3, cranial nerves II-XII intact, strength 5/5 throughout, sensation intact to light touch  Lab Results: Basic Metabolic Panel:  Recent Labs Lab 05/18/13 1908 05/19/13 0330  NA 139  --   K 4.1  --   CL 101  --   CO2 22  --   GLUCOSE 227*  --   BUN 14  --   CREATININE 0.77  --   CALCIUM 10.1  --   MG  --  1.9   Liver Function Tests:  Recent Labs Lab 05/18/13 1908  AST 24  ALT 28  ALKPHOS 89  BILITOT 0.2*  PROT 7.5  ALBUMIN 3.9   CBC:  Recent Labs Lab 05/18/13 1908  WBC 12.3*  NEUTROABS 7.6  HGB 17.2*    HCT 47.8  MCV 89.7  PLT 309   CBG:  Recent Labs Lab 05/18/13 2155 05/19/13 0356 05/19/13 1139  GLUCAP 216* 257* 220*   Hemoglobin A1C:  Recent Labs Lab 05/19/13 0330  HGBA1C 11.1*   Coagulation:  Recent Labs Lab 05/19/13 0330  LABPROT 13.4  INR 1.04   Urine Drug Screen: Drugs of Abuse     Component Value Date/Time   LABOPIA NONE DETECTED 05/18/2013 1953   COCAINSCRNUR NONE DETECTED 05/18/2013 1953   LABBENZ NONE DETECTED 05/18/2013 1953   AMPHETMU NONE DETECTED 05/18/2013 1953   THCU POSITIVE* 05/18/2013 1953   LABBARB NONE DETECTED 05/18/2013 1953    Urinalysis:  Recent Labs Lab 05/18/13 1953  COLORURINE YELLOW  LABSPEC 1.034*  PHURINE 6.5  GLUCOSEU >1000*  HGBUR NEGATIVE  BILIRUBINUR NEGATIVE  KETONESUR NEGATIVE  PROTEINUR NEGATIVE  UROBILINOGEN 1.0  NITRITE NEGATIVE  LEUKOCYTESUR NEGATIVE    Micro Results: No results found for this or any previous visit (from the past 240 hour(s)). Studies/Results: Ct Head Wo Contrast  05/18/2013   CLINICAL DATA:  Seizure like activity.  EXAM: CT HEAD WITHOUT CONTRAST  TECHNIQUE: Contiguous axial images were obtained from the base of  the skull through the vertex without intravenous contrast.  COMPARISON:  No priors.  FINDINGS: Slightly ill-defined area of low attenuation in the medial aspect of the right frontal lobe best demonstrated on image 19 of series 2. In addition, in the inferior aspect of the right frontal lobe (image 11 of series 2) there is a larger area of ill-defined low-attenuation. No definite acute intracranial hemorrhage, significant mass effect, hydrocephalus or abnormal intra or extra-axial fluid collections are noted. Visualized paranasal sinuses and mastoids are well pneumatized, with exception of some very mild mucosal thickening in the ethmoid sinuses bilaterally and the right maxillary sinus. No acute displaced skull fractures are identified.  IMPRESSION: 1. Areas of ill-defined low attenuation in  the medial aspect of the right frontal lobe and inferior aspect of the right frontal lobe. These could represent areas of subacute ischemia, however, the possibility of areas of edema related to underlying mass lesions is not excluded. Further characterization of these findings with followup MRI of the brain with and without IV gadolinium is strongly recommended. These results were called by telephone at the time of interpretation on 05/18/2013 at 7:52 PM to Dr. Pattricia Boss, who verbally acknowledged these results.   Electronically Signed   By: Vinnie Langton M.D.   On: 05/18/2013 19:57   [name] BT Contrast  05/19/2013   CLINICAL DATA:  New onset seizure  EXAM: MRI HEAD WITHOUT AND WITH CONTRAST  TECHNIQUE: Multiplanar, multiecho pulse sequences of the brain and surrounding structures were obtained without and with intravenous contrast.  CONTRAST:  27mL MULTIHANCE GADOBENATE DIMEGLUMINE 529 MG/ML IV SOLN  COMPARISON:  Prior CT from 05/18/2013  FINDINGS: An ill-defined infiltrating masslike region is seen within the anteromedial right frontal lobe (series 5, image 20). This region demonstrates fairly homogeneous hyperintense T2/FLAIR signal with. This region demonstrates fairly homogeneous hyperintense T2/FLAIR signal with hypo intense T1 signal intensity without post-contrast enhancement. There is expansion and thickening of the overlying cortex within the anteromedial right frontal lobe. Abnormal FLAIR signal extends across the corpus callosum in involves the contralateral subcortical white matter of the left frontal lobe. Hyperintense FLAIR signal also extends inferiorly into the inferior right frontal lobe. There is no significant mass effect or midline shift. 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  No other mass lesions identified. Ventricles are normal in size without evidence of hydrocephalus. There is no extra-axial fluid collection.  No diffusion-weighted signal  abnormality is identified to suggest acute intracranial infarct. Normal flow voids seen within the intracranial vasculature.  Cervicomedullary junction is normal. The globes demonstrate a normal appearance. Optic nerves are normal. Pituitary gland is within normal limits.  No abnormal enhancement identified within the brain.  Paranasal sinuses and mastoid air cells are clear.  Calvarium demonstrates a normal appearance with normal signal intensity. Visualized upper cervical spine within normal limits.  IMPRESSION: Ill-defined infiltrative mass within the anteromedial right frontal lobe without associated enhancement, worrisome for primary low grade astrocytoma. Abnormal FLAIR signal extending across the corpus callosum into the contralateral subcortical left frontal lobe is suspicious for infiltrative tumor. No significant mass effect or midline shift.   Electronically Signed   By: Jeannine Boga M.D.   On: 05/19/2013 00:07   Dg Chest Port 1 View  05/18/2013   CLINICAL DATA:  cough  EXAM: PORTABLE CHEST - 1 VIEW  COMPARISON:  DG CHEST 2 VIEW dated 07/18/2011  FINDINGS: Normal mediastinum and cardiac silhouette. Normal pulmonary vasculature. No evidence of effusion, infiltrate,  or pneumothorax. No acute bony abnormality.  IMPRESSION: No acute cardiopulmonary process.   Electronically Signed   By: Suzy Bouchard M.D.   On: 05/18/2013 19:52   Medications: I have reviewed the patient's current medications. Scheduled Meds: . insulin aspart  0-5 Units Subcutaneous QHS  . insulin aspart  0-9 Units Subcutaneous TID WC  . levETIRAcetam  500 mg Oral BID   Continuous Infusions: . sodium chloride 75 mL/hr (05/19/13 0408)   PRN Meds:.acetaminophen, acetaminophen, LORazepam  Assessment/Plan:  # Seizure w/ right frontal brain mass; ? Primary low grade astrocytoma: No more seizure activity. Neurosurgery was consulted and reported that pt will require biopsy, though it is unclear when they plan to do this.   S/p keppra loading dose, now on keppra 500mg  BID. Pt had normal EEG done by neurology.  -Seizure precautions -tele -keppra 500mg  BID w/ ativan prn for seizures -IVF- NS 75cc/hr x 12 hours -appreciate neruosurgery recs  # Elevated AG: Ag 16 on admission, likely 2/2 seizure. Will check lactic acid. S/p 1L NS in ED. -IVF- NS 75cc/hr x 12 hours -f/u lactic acid  # DM type 2:  A1c 11.1 on admission. CBGs ranging 216-257. Was supposed to be on metformin 1000BID at home, though is noncompliant. -SSI -will start lantus 5U qHS  # Tobacco abuse: apparently was smoking in the bathroom. Pt counseled that this in inappropriate by the nurse.  -nicotine patch  # VTE: will need to ask neurosurgery, SCDs for now  # Diet: carb mod  Code status: full  Dispo: Disposition is deferred at this time, awaiting improvement of current medical problems.    The patient does have a current PCP (Juluis Mire, MD) and does not need an [facility] hospital follow-up appointment after discharge.  The patient does not have transportation limitations that hinder transportation to clinic appointments.  .Services Needed at time of discharge: Y = Yes, Blank = No PT:   OT:   RN:   Equipment:   Other:     LOS: 1 day   Rebecca Eaton, MD 05/19/2013, 12:22 PM

## 2013-05-19 NOTE — Progress Notes (Signed)
PT Cancellation Note  Patient Details Name: Jonathan Barker MRN: [id number] DOB: 1965-09-30   Cancelled Treatment:    Reason Eval/Treat Not Completed: PT screened, no needs identified, will sign off.  Pt ambulated independently outside smoking. No acute PT needs warranted at this time.    Elie Confer [place], [place] 05/19/2013, 3:48 PM

## 2013-05-19 NOTE — H&P (Signed)
Date: 05/19/2013               Patient Name:  Jonathan Barker MRN: [id number]  DOB: [birth year] Age / Sex: 48 y.o., male   PCP: Juluis Mire, MD         Medical Service: Internal Medicine Teaching Service         Attending Physician: Dr. Axel Filler, MD    First Contact: Dr. Mechele Claude   Pager: [phone]  Second Contact: Dr. Jerene Pitch    Pager: [phone]       After Hours (After 5p/  First Contact Pager: [phone]  weekends / holidays): Second Contact Pager: [phone]   Chief Complaint: seizure-like activity  History of Present Illness:  Jonathan Barker is a 48 y.o. Man with a PMH of T2DM, heavy smoking, hepatitis (unknown what kind), GERD and medical noncompliance, who presented to the ED after a witnessed seizure-like activity. He has never had seizures in the past. His long-time girlfriend and daughter witnessed seizure.   Patient states that he started to have acute onset seizure-like activity at about 545 pm on 05/18/13. Patient was standing up outside of a school yard with his hands on his pockets when he suddenly felt his eyes rolling backward, and he was blinking excessively. Patient had to sit down on the ground and then started to shake " all over my body". His family assisted him to lie his left side and called the EMS. Family states that the entire episode lasted for about 45 seconds and resolved spontaneously. His family noticed transient right facial droop and slurred speech which lasted a few seconds and resolved spontaneously.    Denies tongue biting,  bladder or bowel incontinence. Denies headache, dizziness, lightheadedness, vertigo, double vision, blurry vision,  focal weakness or numbness, imbalance, or confusion. No recent fever, infection, skin rash, foreign travel, injury or head trauma. He was brought to the [facility] ED and a head CT showed "areas of ill-defined low attenuation in the medial aspect of the right frontal lobe and inferior aspect of the right  frontal lobe."  A subsequent MRI brain W W/O contrast showed" ill defined infiltrative mass within the anteromedial right frontal lobe without associated enhancement. worrisome for primary low grade astrocytoma....Marland KitchenMarland KitchenNo significant mass effect or midline shift."  The Neurologist was consulted in the ED. Patient was given a loading dose Keppra followed by Keppra 500 BID. The IMTS is called for admission.   Of note, patient's family states that he has been easily angry and agitated over pastmonth, They think that his temple and personality has changed though patient denies it. No family history of seizure or brain tumor.    Meds: Current Facility-Administered Medications  Medication Dose Route Frequency Provider Last Rate Last Dose  . levETIRAcetam (KEPPRA) tablet 500 mg  500 mg Oral BID Dorian Pod, MD       Current Outpatient Prescriptions  Medication Sig Dispense Refill  . metFORMIN (GLUCOPHAGE) 1000 MG tablet Take 1,000 mg by mouth daily.        Allergies: Allergies as of 05/18/2013  . (No Known Allergies)   Past Medical History  Diagnosis Date  . Shortness of breath   . Hepatitis   . DM (diabetes mellitus) 08/09/2011    A1c at diagnosis = 12.4  . GERD (gastroesophageal reflux disease) 08/09/2011  . Seizure 05/19/2013   Past Surgical History  Procedure Laterality Date  . Appendectomy     Family History  Problem Relation Age of Onset  .  Breast cancer Mother   . Diabetes Father   . Heart disease Father   . Diabetes Paternal Uncle   . Diabetes Brother    History   Social History  . Marital Status: Significant Other    Spouse Name: N/A    Number of Children: N/A  . Years of Education: N/A   Occupational History  . Not on file.   Social History Main Topics  . Smoking status: Current Every Day Smoker -- 1.00 packs/day    Types: Cigarettes  . Smokeless tobacco: Never Used     Comment: smoking for 30 minutes. 15 cig daily  . Alcohol Use: No     Comment: social  . Drug  Use: Yes     Comment: Marijuana daily  . Sexual Activity: Not on file   Other Topics Concern  . Not on file   Social History Narrative   [place]. He does not work now.     Review of Systems: Review of Systems:  Constitutional:  Denies fever, chills, diaphoresis, appetite change and fatigue.   HEENT:  Denies congestion, sore throat, rhinorrhea, sneezing, mouth sores, trouble swallowing, neck pain   Respiratory:  Denies SOB, DOE, cough, and wheezing.   Cardiovascular:  Denies palpitations and leg swelling.   Gastrointestinal:  Denies nausea, vomiting, abdominal pain, diarrhea, constipation, blood in stool and abdominal distention.   Genitourinary:  Denies dysuria, urgency, frequency, hematuria, flank pain and difficulty urinating.   Musculoskeletal:  Denies myalgias, back pain, joint swelling, arthralgias and gait problem.   Skin:  Denies pallor, rash and wound.   Neurological:  Denies dizziness,syncope, weakness, light-headedness, numbness and headaches. Positive for seizure activity, transient slurred speech/right facial droop.    Marland Kitchen  Physical Exam: Blood pressure 118/87, pulse 93, temperature 98.5 F (36.9 C), temperature source Oral, resp. rate 16, SpO2 95.00%. General: alert, unkempt. NAD Head: normocephalic and atraumatic. Mouth: pharynx pink and moist, no erythema, and no exudates.  Neck: supple, full ROM, no thyromegaly, no JVD, and no carotid bruits.  Lungs: normal respiratory effort, no accessory muscle use, normal breath sounds, no crackles, and no wheezes. Heart: normal rate, regular rhythm, no murmur, no gallop, and no rub.  Abdomen: soft, non-tender, normal bowel sounds, no distention, no guarding, no rebound tenderness, no hepatomegaly, and no splenomegaly.  Msk: no joint swelling, no joint warmth, and no redness over joints.  Pulses: 2+ DP/PT pulses bilaterally Extremities: No cyanosis, clubbing, edema Mental Status: Alert, oriented x 3,  thought content appropriate.  Speech fluent without evidence of aphasia.  Able to follow 3 step commands without difficulty. Cranial Nerves: II: Visual fields grossly normal, pupils equal, round, reactive to light and accommodation. Right gaze nystagmus noted III,IV, VI: ptosis not present, extra-ocular motions intact bilaterally V,VII: smile symmetric, facial light touch sensation normal bilaterally VIII: hearing normal bilaterally IX,X: gag reflex present XI: bilateral shoulder shrug XII: midline tongue extension without atrophy or fasciculations  Motor: Right : Upper extremity   5/5    Left:     Upper extremity   5/5  Lower extremity   5/5     Lower extremity   5/5 Tone and bulk:normal tone throughout; no atrophy noted Sensory: Pinprick and light touch intact throughout, bilaterally Deep Tendon Reflexes:  2+ throughout and symmetric  Plantars: Right: downgoing   Left: downgoing  Cerebellar: normal finger-to-nose,  normal heel-to-shin test Gait: not tested CV: pulses palpable throughout     Lab results: Basic Metabolic Panel:  Recent Labs  05/18/13 1908  Tevita Gomer 139  K 4.1  CL 101  CO2 22  GLUCOSE 227*  BUN 14  CREATININE 0.77  CALCIUM 10.1   Liver Function Tests:  Recent Labs  05/18/13 1908  AST 24  ALT 28  ALKPHOS 89  BILITOT 0.2*  PROT 7.5  ALBUMIN 3.9   CBC:  Recent Labs  05/18/13 1908  WBC 12.3*  NEUTROABS 7.6  HGB 17.2*  HCT 47.8  MCV 89.7  PLT 309   CBG:  Recent Labs  05/18/13 2155  GLUCAP 216*   Urine Drug Screen: Drugs of Abuse     Component Value Date/Time   LABOPIA NONE DETECTED 05/18/2013 1953   COCAINSCRNUR NONE DETECTED 05/18/2013 1953   LABBENZ NONE DETECTED 05/18/2013 1953   AMPHETMU NONE DETECTED 05/18/2013 1953   THCU POSITIVE* 05/18/2013 1953   LABBARB NONE DETECTED 05/18/2013 1953    Urinalysis:  Recent Labs  05/18/13 1953  COLORURINE YELLOW  LABSPEC 1.034*  PHURINE 6.5  GLUCOSEU >1000*  HGBUR NEGATIVE    BILIRUBINUR NEGATIVE  KETONESUR NEGATIVE  PROTEINUR NEGATIVE  UROBILINOGEN 1.0  NITRITE NEGATIVE  LEUKOCYTESUR NEGATIVE   Imaging results:  Ct Head Wo Contrast  05/18/2013   CLINICAL DATA:  Seizure like activity.  EXAM: CT HEAD WITHOUT CONTRAST  TECHNIQUE: Contiguous axial images were obtained from the base of the skull through the vertex without intravenous contrast.  COMPARISON:  No priors.  FINDINGS: Slightly ill-defined area of low attenuation in the medial aspect of the right frontal lobe best demonstrated on image 19 of series 2. In addition, in the inferior aspect of the right frontal lobe (image 11 of series 2) there is a larger area of ill-defined low-attenuation. No definite acute intracranial hemorrhage, significant mass effect, hydrocephalus or abnormal intra or extra-axial fluid collections are noted. Visualized paranasal sinuses and mastoids are well pneumatized, with exception of some very mild mucosal thickening in the ethmoid sinuses bilaterally and the right maxillary sinus. No acute displaced skull fractures are identified.  IMPRESSION: 1. Areas of ill-defined low attenuation in the medial aspect of the right frontal lobe and inferior aspect of the right frontal lobe. These could represent areas of subacute ischemia, however, the possibility of areas of edema related to underlying mass lesions is not excluded. Further characterization of these findings with followup MRI of the brain with and without IV gadolinium is strongly recommended. These results were called by telephone at the time of interpretation on 05/18/2013 at 7:52 PM to Dr. Pattricia Boss, who verbally acknowledged these results.   Electronically Signed   By: Vinnie Langton M.D.   On: 05/18/2013 19:57   [name] [id number] Contrast  05/19/2013   CLINICAL DATA:  New onset seizure  EXAM: MRI HEAD WITHOUT AND WITH CONTRAST  TECHNIQUE: Multiplanar, multiecho pulse sequences of the brain and surrounding structures were obtained  without and with intravenous contrast.  CONTRAST:  22mL MULTIHANCE GADOBENATE DIMEGLUMINE 529 MG/ML IV SOLN  COMPARISON:  Prior CT from 05/18/2013  FINDINGS: An ill-defined infiltrating masslike region is seen within the anteromedial right frontal lobe (series 5, image 20). This region demonstrates fairly homogeneous hyperintense T2/FLAIR signal with. This region demonstrates fairly homogeneous hyperintense T2/FLAIR signal with hypo intense T1 signal intensity without post-contrast enhancement. There is expansion and thickening of the overlying cortex within the anteromedial right frontal lobe. Abnormal FLAIR signal extends across the corpus callosum in involves the contralateral subcortical white matter of the left frontal lobe. Hyperintense FLAIR signal  also extends inferiorly into the inferior right frontal lobe. There is no significant mass effect or midline shift. 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  No other mass lesions identified. Ventricles are normal in size without evidence of hydrocephalus. There is no extra-axial fluid collection.  No diffusion-weighted signal abnormality is identified to suggest acute intracranial infarct. Normal flow voids seen within the intracranial vasculature.  Cervicomedullary junction is normal. The globes demonstrate a normal appearance. Optic nerves are normal. Pituitary gland is within normal limits.  No abnormal enhancement identified within the brain.  Paranasal sinuses and mastoid air cells are clear.  Calvarium demonstrates a normal appearance with normal signal intensity. Visualized upper cervical spine within normal limits.  IMPRESSION: Ill-defined infiltrative mass within the anteromedial right frontal lobe without associated enhancement, worrisome for primary low grade astrocytoma. Abnormal FLAIR signal extending across the corpus callosum into the contralateral subcortical left frontal lobe is suspicious for infiltrative tumor. No  significant mass effect or midline shift.   Electronically Signed   By: Jeannine Boga M.D.   On: 05/19/2013 00:07   Dg Chest Port 1 View  05/18/2013   CLINICAL DATA:  cough  EXAM: PORTABLE CHEST - 1 VIEW  COMPARISON:  DG CHEST 2 VIEW dated 07/18/2011  FINDINGS: Normal mediastinum and cardiac silhouette. Normal pulmonary vasculature. No evidence of effusion, infiltrate, or pneumothorax. No acute bony abnormality.  IMPRESSION: No acute cardiopulmonary process.   Electronically Signed   By: Suzy Bouchard M.D.   On: 05/18/2013 19:52     Assessment & Plan by Problem: Jonathan Barker is a 48 y.o. Man with a PMH of T2DM, heavy smoking, hepatitis (unknown what kind), GERD and medical noncompliance, who presented to the ED after a witnessed seizure-like activity. He has never had seizures in the past. His long-time girlfriend and daughter witnessed the seizure. Patient was started on Keppra loading dose followed by Keppra 500 BID.  Head CT on 05/18/13 showed "areas of ill-defined low attenuation in the medial aspect of the right frontal lobe and inferior aspect of the right frontal lobe."  MRI brain on 05/18/13 showed" ill defined infiltrative mass within the anteromedial right frontal lobe without associated enhancement. worrisome for primary low grade astrocytoma....Marland KitchenMarland KitchenNo significant mass effect or midline shift."   #1. Seizure and Right frontal brain mass      Patient with a PMH of T2DM, heavy smoking, hepatitis (unknown what kind), presented with one episode of witnessed convulsive seizure-like activity that lasted less than one minute.  The clinical manifestation is likely either generalized tonic-clonic seizure vs partial with secondary generalization. The etiology of her seizure activity is most likely associated with the newly discovered right frontal brain mass--? primary low grade astrocytoma.   No other metabolic etiology noted such as encephalopathy, hypoglycemia (he is rather  hyperglycemia), hypotension, uremia or liver failure. No electrolytes imbalance or medication induced noted. He denies alcohol or illicit drug abuse except daily Marijuana. UDS showed positive Marijuana, which is unlikely the etiology since he has never had seizure in the past and we have an obvious etiology.   Plan - Appreciate Neurologist consult.  - Admit to Telemetry - Seizure precaution  - NPO for now pending Neurosurgeon consult in am - Gentle IVF hydration x 12 h when NPO and for renal protection s/p MRI contrast.  - Monitor electrolytes imbalance  - AED  Keppra loading dose followed by 500 BID   Ativan IV PRN - Agree with the defer of steroid  given no mass/edema effect clinically and per imaging.  - Will need to consult Neurosurgeon in am.  - will continue to follow up.   # 2 High Anion gap acidosis. AG 16       Given his clinical manifestation, the etiology could be relate to the type A ( tissue hypoxia) lactic acidosis in the setting of seizure. No cardiac, renal liver failure noted. No indication of infection, medication or toxin induced since he has not been taking metformin and denies all toxin ingestion. No evidences suggestive DKA ( CBG of 200's and no ketone), or rhabdo ( UA no RBC and no Renal insufficiency).  Plan: - will get lactic acid, may be corrected since he received one liter of IVF NS at ED - Will repeat BMP in am . Should expect the AG closing.    #3.  Diabetes type 2, uncontrolled          Patient was diagnosed with T2DM with a HGB A1C of 12.4 in March 2013 during his hospitalization for chest pain evaluation. He was started on Metformin 1000 BID and his HGB A1C dropped to 6.9 in 2013. He then lost to follow up and reports medical noncompliance.  - will check his A1C -SSI for now - consider to add basal tomorrow   #4.  HLD (hyperlipidemia)     Lipid Panel     Component Value Date/Time   CHOL 184 01/21/2012 1453   TRIG 191* 01/21/2012 1453   HDL 31*  01/21/2012 1453   CHOLHDL 5.9 01/21/2012 1453   VLDL 38 01/21/2012 1453   LDLCALC 115* 01/21/2012 1453   - not on any meds - Will defer it to the outpatient management.      #5.  GERD (gastroesophageal reflux disease)       Asymptomatic and not on any meds   - monitor  #6. Tobacco abuse         Smoking 25 cig daily x 30 years.   - Nicotine patch - SW consult    #7.  VTE: SCD pending Neurosurgeon evaluation in am. Consider to order Heparin if no surgery indicated on 05/19/13.   Dispo: Disposition is deferred at this time, awaiting improvement of current medical problems. Anticipated discharge in approximately 2-3 day(s).   The patient does have a current PCP (Juluis Mire, MD) and does need an [facility] hospital follow-up appointment after discharge.  The patient does have transportation limitations that hinder transportation to clinic appointments.  Signed: Charlann Lange, MD 05/19/2013, 2:57 AM

## 2013-05-20 DIAGNOSIS — F17200 Nicotine dependence, unspecified, uncomplicated: Secondary | ICD-10-CM

## 2013-05-20 DIAGNOSIS — Z91199 Patient's noncompliance with other medical treatment and regimen due to unspecified reason: Secondary | ICD-10-CM

## 2013-05-20 DIAGNOSIS — D496 Neoplasm of unspecified behavior of brain: Secondary | ICD-10-CM

## 2013-05-20 DIAGNOSIS — G939 Disorder of brain, unspecified: Secondary | ICD-10-CM

## 2013-05-20 DIAGNOSIS — E119 Type 2 diabetes mellitus without complications: Secondary | ICD-10-CM

## 2013-05-20 DIAGNOSIS — Z9119 Patient's noncompliance with other medical treatment and regimen: Secondary | ICD-10-CM

## 2013-05-20 LAB — CBC
HCT: 46 % (ref 39.0–52.0)
Hemoglobin: 16 g/dL (ref 13.0–17.0)
MCH: 31.1 pg (ref 26.0–34.0)
MCHC: 34.8 g/dL (ref 30.0–36.0)
MCV: 89.3 fL (ref 78.0–100.0)
PLATELETS: 268 10*3/uL (ref 150–400)
RBC: 5.15 MIL/uL (ref 4.22–5.81)
RDW: 12.3 % (ref 11.5–15.5)
WBC: 10.5 10*3/uL (ref 4.0–10.5)

## 2013-05-20 LAB — GLUCOSE, CAPILLARY
GLUCOSE-CAPILLARY: 252 mg/dL — AB (ref 70–99)
GLUCOSE-CAPILLARY: 273 mg/dL — AB (ref 70–99)
Glucose-Capillary: 214 mg/dL — ABNORMAL HIGH (ref 70–99)
Glucose-Capillary: 198 mg/dL — ABNORMAL HIGH (ref 70–99)
Glucose-Capillary: 217 mg/dL — ABNORMAL HIGH (ref 70–99)

## 2013-05-20 MED ORDER — INSULIN ASPART 100 UNIT/ML ~~LOC~~ SOLN
0.0000 [IU] | Freq: Three times a day (TID) | SUBCUTANEOUS | Status: DC
  Administered 2013-05-21: 5 [IU] via SUBCUTANEOUS
  Administered 2013-05-21: 3 [IU] via SUBCUTANEOUS
  Administered 2013-05-21: 5 [IU] via SUBCUTANEOUS
  Administered 2013-05-22: 3 [IU] via SUBCUTANEOUS
  Administered 2013-05-22: 5 [IU] via SUBCUTANEOUS
  Administered 2013-05-22: 11 [IU] via SUBCUTANEOUS
  Administered 2013-05-23 (×2): 5 [IU] via SUBCUTANEOUS
  Administered 2013-05-23: 3 [IU] via SUBCUTANEOUS
  Administered 2013-05-25: 5 [IU] via SUBCUTANEOUS

## 2013-05-20 MED ORDER — INSULIN GLARGINE 100 UNIT/ML ~~LOC~~ SOLN
8.0000 [IU] | Freq: Every day | SUBCUTANEOUS | Status: DC
  Administered 2013-05-20: 8 [IU] via SUBCUTANEOUS
  Filled 2013-05-20 (×2): qty 0.08

## 2013-05-20 NOTE — Progress Notes (Signed)
Subjective: Patient has remained seizure free.  Tolerating Keppra without side effects.  Has consented to a biopsy.    Objective: Current vital signs: BP 118/83  Pulse 85  Temp(Src) 97.5 F (36.4 C) (Oral)  Resp 18  Ht 5\' 11"  (1.803 m)  Wt 83.915 kg (185 lb)  BMI 25.81 kg/m2  SpO2 99% Vital signs in last 24 hours: Temp:  [97.5 F (36.4 C)-97.9 F (36.6 C)] 97.5 F (36.4 C) (01/18 1355) Pulse Rate:  [65-85] 85 (01/18 1355) Resp:  [15-18] 18 (01/18 1355) BP: (118-124)/(76-83) 118/83 mmHg (01/18 1355) SpO2:  [95 %-99 %] 99 % (01/18 1355)  Intake/Output from previous day: 01/17 0701 - 01/18 0700 In: 840 [P.O.:840] Out: -  Intake/Output this shift: Total I/O In: 840 [P.O.:840] Out: -  Nutritional status: Carb Control  Neurologic Exam: Mental Status:  Alert, oriented, thought content appropriate. Speech fluent without evidence of aphasia. Able to follow 3 step commands without difficulty.  Cranial Nerves:  II: Discs flat bilaterally; Visual fields grossly normal, pupils equal, round, reactive to light and accommodation  III,IV, VI: ptosis not present, extra-ocular motions intact bilaterally  V,VII: smile symmetric, facial light touch sensation normal bilaterally  VIII: hearing normal bilaterally  IX,X: gag reflex present  XI: bilateral shoulder shrug  XII: midline tongue extension without atrophy or fasciculations  Motor:  5/5 throughout Sensory: Pinprick and light touch intact throughout, bilaterally  Deep Tendon Reflexes:  2+ throughout Plantars:  Right: downgoing     Left: downgoing  Cerebellar:  normal finger-to-nose, normal heel-to-shin test   Lab Results: Basic Metabolic Panel:  Recent Labs Lab 05/18/13 1908 05/19/13 0330  NA 139  --   K 4.1  --   CL 101  --   CO2 22  --   GLUCOSE 227*  --   BUN 14  --   CREATININE 0.77  --   CALCIUM 10.1  --   MG  --  1.9    Liver Function Tests:  Recent Labs Lab 05/18/13 1908  AST 24  ALT 28  ALKPHOS  89  BILITOT 0.2*  PROT 7.5  ALBUMIN 3.9   No results found for this basename: LIPASE, AMYLASE,  in the last 168 hours No results found for this basename: AMMONIA,  in the last 168 hours  CBC:  Recent Labs Lab 05/18/13 1908 05/20/13 0500  WBC 12.3* 10.5  NEUTROABS 7.6  --   HGB 17.2* 16.0  HCT 47.8 46.0  MCV 89.7 89.3  PLT 309 268    Cardiac Enzymes: No results found for this basename: CKTOTAL, CKMB, CKMBINDEX, TROPONINI,  in the last 168 hours  Lipid Panel: No results found for this basename: CHOL, TRIG, HDL, CHOLHDL, VLDL, LDLCALC,  in the last 168 hours  CBG:  Recent Labs Lab 05/19/13 1647 05/19/13 2047 05/20/13 0813 05/20/13 1148 05/20/13 1626  GLUCAP 255* 247* 198* 217* 252*    Microbiology: No results found for this or any previous visit.  Coagulation Studies:  Recent Labs  05/19/13 0330  LABPROT 13.4  INR 1.04    Imaging: Ct Head Wo Contrast  05/18/2013   CLINICAL DATA:  Seizure like activity.  EXAM: CT HEAD WITHOUT CONTRAST  TECHNIQUE: Contiguous axial images were obtained from the base of the skull through the vertex without intravenous contrast.  COMPARISON:  No priors.  FINDINGS: Slightly ill-defined area of low attenuation in the medial aspect of the right frontal lobe best demonstrated on image 19 of series 2. In addition, in  the inferior aspect of the right frontal lobe (image 11 of series 2) there is a larger area of ill-defined low-attenuation. No definite acute intracranial hemorrhage, significant mass effect, hydrocephalus or abnormal intra or extra-axial fluid collections are noted. Visualized paranasal sinuses and mastoids are well pneumatized, with exception of some very mild mucosal thickening in the ethmoid sinuses bilaterally and the right maxillary sinus. No acute displaced skull fractures are identified.  IMPRESSION: 1. Areas of ill-defined low attenuation in the medial aspect of the right frontal lobe and inferior aspect of the right  frontal lobe. These could represent areas of subacute ischemia, however, the possibility of areas of edema related to underlying mass lesions is not excluded. Further characterization of these findings with followup MRI of the brain with and without IV gadolinium is strongly recommended. These results were called by telephone at the time of interpretation on 05/18/2013 at 7:52 PM to Dr. Pattricia Boss, who verbally acknowledged these results.   Electronically Signed   By: Vinnie Langton M.D.   On: 05/18/2013 19:57   [name] [id number] Contrast  05/19/2013   CLINICAL DATA:  New onset seizure  EXAM: MRI HEAD WITHOUT AND WITH CONTRAST  TECHNIQUE: Multiplanar, multiecho pulse sequences of the brain and surrounding structures were obtained without and with intravenous contrast.  CONTRAST:  65mL MULTIHANCE GADOBENATE DIMEGLUMINE 529 MG/ML IV SOLN  COMPARISON:  Prior CT from 05/18/2013  FINDINGS: An ill-defined infiltrating masslike region is seen within the anteromedial right frontal lobe (series 5, image 20). This region demonstrates fairly homogeneous hyperintense T2/FLAIR signal with. This region demonstrates fairly homogeneous hyperintense T2/FLAIR signal with hypo intense T1 signal intensity without post-contrast enhancement. There is expansion and thickening of the overlying cortex within the anteromedial right frontal lobe. Abnormal FLAIR signal extends across the corpus callosum in involves the contralateral subcortical white matter of the left frontal lobe. Hyperintense FLAIR signal also extends inferiorly into the inferior right frontal lobe. There is no significant mass effect or midline shift. 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  No other mass lesions identified. Ventricles are normal in size without evidence of hydrocephalus. There is no extra-axial fluid collection.  No diffusion-weighted signal abnormality is identified to suggest acute intracranial infarct. Normal flow voids seen  within the intracranial vasculature.  Cervicomedullary junction is normal. The globes demonstrate a normal appearance. Optic nerves are normal. Pituitary gland is within normal limits.  No abnormal enhancement identified within the brain.  Paranasal sinuses and mastoid air cells are clear.  Calvarium demonstrates a normal appearance with normal signal intensity. Visualized upper cervical spine within normal limits.  IMPRESSION: Ill-defined infiltrative mass within the anteromedial right frontal lobe without associated enhancement, worrisome for primary low grade astrocytoma. Abnormal FLAIR signal extending across the corpus callosum into the contralateral subcortical left frontal lobe is suspicious for infiltrative tumor. No significant mass effect or midline shift.   Electronically Signed   By: Jeannine Boga M.D.   On: 05/19/2013 00:07   Dg Chest Port 1 View  05/18/2013   CLINICAL DATA:  cough  EXAM: PORTABLE CHEST - 1 VIEW  COMPARISON:  DG CHEST 2 VIEW dated 07/18/2011  FINDINGS: Normal mediastinum and cardiac silhouette. Normal pulmonary vasculature. No evidence of effusion, infiltrate, or pneumothorax. No acute bony abnormality.  IMPRESSION: No acute cardiopulmonary process.   Electronically Signed   By: Suzy Bouchard M.D.   On: 05/18/2013 19:52    Medications:  I have reviewed the patient's current medications. Scheduled: .  insulin aspart  0-5 Units Subcutaneous QHS  . insulin aspart  0-9 Units Subcutaneous TID WC  . insulin glargine  5 Units Subcutaneous QHS  . levETIRAcetam  500 mg Oral BID  . nicotine  14 mg Transdermal Daily    Assessment/Plan: 48 year old male presenting after first seizure.  Imaging reveals a mass lesion.  Patient started on Keppra.  Tolerating well.  No further seizures noted.    Recommendations: 1.  Agree with biopsy 2.  Continue Keppra at current dose 3.  Continue seizure precautions   LOS: 2 days   Alexis Goodell, MD [facility] [phone] 05/20/2013  4:57 PM

## 2013-05-20 NOTE — Progress Notes (Signed)
Subjective: Jonathan Barker was seen and examined at bedside with his girlfriend in the room as well.  He does not wish to stay in the hospital for very long but says he will stay for biopsy if soon or will follow up as needed. No more seizure episodes. He does leave the unit to smoke a cigarette and does not want a patch.   Objective: Vital signs in last 24 hours: Filed Vitals:   05/19/13 1426 05/19/13 2135 05/20/13 0602 05/20/13 1355  BP: 117/82 124/76 120/79 118/83  Pulse: 72 80 65 85  Temp: 97.6 F (36.4 C) 97.6 F (36.4 C) 97.9 F (36.6 C) 97.5 F (36.4 C)  TempSrc: Oral Oral Oral   Resp: 18 15 15 18   Height:      Weight:      SpO2: 100% 97% 95% 99%   Weight change:   Intake/Output Summary (Last 24 hours) at 05/20/13 1752 Last data filed at 05/20/13 1300  Gross per 24 hour  Intake   1320 ml  Output      0 ml  Net   1320 ml   Physical exam: Vitals reviewed. General: resting in bed, NAD HEENT: PERRL, EOMI Cardiac: RRR, no rubs, murmurs or gallops Pulm: coarse b/l breath sounds Abd: soft, nontender, nondistended, BS present Ext: warm and well perfused, no pedal edema, moving all 4 extremities Neuro: alert and oriented X3, cranial nerves II-XII grossly intact, strength equal in bilateral upper and lower extremities  Lab Results: Basic Metabolic Panel:  Recent Labs Lab 05/18/13 1908 05/19/13 0330  NA 139  --   K 4.1  --   CL 101  --   CO2 22  --   GLUCOSE 227*  --   BUN 14  --   CREATININE 0.77  --   CALCIUM 10.1  --   MG  --  1.9   Liver Function Tests:  Recent Labs Lab 05/18/13 1908  AST 24  ALT 28  ALKPHOS 89  BILITOT 0.2*  PROT 7.5  ALBUMIN 3.9   CBC:  Recent Labs Lab 05/18/13 1908 05/20/13 0500  WBC 12.3* 10.5  NEUTROABS 7.6  --   HGB 17.2* 16.0  HCT 47.8 46.0  MCV 89.7 89.3  PLT 309 268   CBG:  Recent Labs Lab 05/19/13 1139 05/19/13 1647 05/19/13 2047 05/20/13 0813 05/20/13 1148 05/20/13 1626  GLUCAP 220* 255* 247* 198*  217* 252*   Hemoglobin A1C:  Recent Labs Lab 05/19/13 0330  HGBA1C 11.1*   Coagulation:  Recent Labs Lab 05/19/13 0330  LABPROT 13.4  INR 1.04   Urine Drug Screen: Drugs of Abuse     Component Value Date/Time   LABOPIA NONE DETECTED 05/18/2013 1953   COCAINSCRNUR NONE DETECTED 05/18/2013 1953   LABBENZ NONE DETECTED 05/18/2013 1953   AMPHETMU NONE DETECTED 05/18/2013 1953   THCU POSITIVE* 05/18/2013 1953   LABBARB NONE DETECTED 05/18/2013 1953    Urinalysis:  Recent Labs Lab 05/18/13 1953  COLORURINE YELLOW  LABSPEC 1.034*  PHURINE 6.5  GLUCOSEU >1000*  HGBUR NEGATIVE  BILIRUBINUR NEGATIVE  KETONESUR NEGATIVE  PROTEINUR NEGATIVE  UROBILINOGEN 1.0  NITRITE NEGATIVE  LEUKOCYTESUR NEGATIVE   Studies/Results: Ct Head Wo Contrast  05/18/2013   CLINICAL DATA:  Seizure like activity.  EXAM: CT HEAD WITHOUT CONTRAST  TECHNIQUE: Contiguous axial images were obtained from the base of the skull through the vertex without intravenous contrast.  COMPARISON:  No priors.  FINDINGS: Slightly ill-defined area of low attenuation in the  medial aspect of the right frontal lobe best demonstrated on image 19 of series 2. In addition, in the inferior aspect of the right frontal lobe (image 11 of series 2) there is a larger area of ill-defined low-attenuation. No definite acute intracranial hemorrhage, significant mass effect, hydrocephalus or abnormal intra or extra-axial fluid collections are noted. Visualized paranasal sinuses and mastoids are well pneumatized, with exception of some very mild mucosal thickening in the ethmoid sinuses bilaterally and the right maxillary sinus. No acute displaced skull fractures are identified.  IMPRESSION: 1. Areas of ill-defined low attenuation in the medial aspect of the right frontal lobe and inferior aspect of the right frontal lobe. These could represent areas of subacute ischemia, however, the possibility of areas of edema related to underlying mass lesions  is not excluded. Further characterization of these findings with followup MRI of the brain with and without IV gadolinium is strongly recommended. These results were called by telephone at the time of interpretation on 05/18/2013 at 7:52 PM to Dr. Pattricia Boss, who verbally acknowledged these results.   Electronically Signed   By: Vinnie Langton M.D.   On: 05/18/2013 19:57   Jonathan Barker [id number] Contrast  05/19/2013   CLINICAL DATA:  New onset seizure  EXAM: MRI HEAD WITHOUT AND WITH CONTRAST  TECHNIQUE: Multiplanar, multiecho pulse sequences of the brain and surrounding structures were obtained without and with intravenous contrast.  CONTRAST:  52mL MULTIHANCE GADOBENATE DIMEGLUMINE 529 MG/ML IV SOLN  COMPARISON:  Prior CT from 05/18/2013  FINDINGS: An ill-defined infiltrating masslike region is seen within the anteromedial right frontal lobe (series 5, image 20). This region demonstrates fairly homogeneous hyperintense T2/FLAIR signal with. This region demonstrates fairly homogeneous hyperintense T2/FLAIR signal with hypo intense T1 signal intensity without post-contrast enhancement. There is expansion and thickening of the overlying cortex within the anteromedial right frontal lobe. Abnormal FLAIR signal extends across the corpus callosum in involves the contralateral subcortical white matter of the left frontal lobe. Hyperintense FLAIR signal also extends inferiorly into the inferior right frontal lobe. There is no significant mass effect or midline shift. 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  No other mass lesions identified. Ventricles are normal in size without evidence of hydrocephalus. There is no extra-axial fluid collection.  No diffusion-weighted signal abnormality is identified to suggest acute intracranial infarct. Normal flow voids seen within the intracranial vasculature.  Cervicomedullary junction is normal. The globes demonstrate a normal appearance. Optic nerves are  normal. Pituitary gland is within normal limits.  No abnormal enhancement identified within the brain.  Paranasal sinuses and mastoid air cells are clear.  Calvarium demonstrates a normal appearance with normal signal intensity. Visualized upper cervical spine within normal limits.  IMPRESSION: Ill-defined infiltrative mass within the anteromedial right frontal lobe without associated enhancement, worrisome for primary low grade astrocytoma. Abnormal FLAIR signal extending across the corpus callosum into the contralateral subcortical left frontal lobe is suspicious for infiltrative tumor. No significant mass effect or midline shift.   Electronically Signed   By: Jeannine Boga M.D.   On: 05/19/2013 00:07   Dg Chest Port 1 View  05/18/2013   CLINICAL DATA:  cough  EXAM: PORTABLE CHEST - 1 VIEW  COMPARISON:  DG CHEST 2 VIEW dated 07/18/2011  FINDINGS: Normal mediastinum and cardiac silhouette. Normal pulmonary vasculature. No evidence of effusion, infiltrate, or pneumothorax. No acute bony abnormality.  IMPRESSION: No acute cardiopulmonary process.   Electronically Signed   By: Helane Gunther.D.  On: 05/18/2013 19:52   Medications: I have reviewed the patient's current medications. Scheduled Meds: . insulin aspart  0-5 Units Subcutaneous QHS  . insulin aspart  0-9 Units Subcutaneous TID WC  . insulin glargine  5 Units Subcutaneous QHS  . levETIRAcetam  500 mg Oral BID  . nicotine  14 mg Transdermal Daily   Continuous Infusions:   PRN Meds:.acetaminophen, acetaminophen, LORazepam  Assessment/Plan: Jonathan. Rizzi is a 48 year old male with chronic tobacco dependence and uncontrolled DM2 admitted for new onset seizures.  New onset seizure w/ right frontal brain mass; ? Primary low grade astrocytoma: No more seizure activity since admission.  Neurosurgery recommends biopsy, but unclear when. Neurology also following, tolerating keppra.  -Seizure precautions -continue keppra -appreciate  neurology and neurosurgery following -need to confirm date for biopsy   Elevated AG: Ag 16 on admission, likely 2/2 seizure.  Lactic acid 2.1 -f/u bmet  DM type 2:  A1c 11.1 on admission. CBGs 200's today after starting lantus 5 units.  Was supposed to be on metformin 1000BID at home, though is noncompliant. -increase lantus to 8 units qhs -increase ssi from sensitive scale to moderate -continue hs sliding scale -cbg monitoring  Tobacco abuse: admits to smoking 2 packs per day. Says he will try to not smoke in the hospital, refuses patch as it makes him sick. Still walks off the floor at times to smoke.  -counseled extensively on smoking cessation and NOT smoking in the hospital  VTE: will need to ask neurosurgery, SCDs for now. Patient is ambulating.  Diet: carb mod Code status: full  Dispo: Disposition is deferred at this time, awaiting improvement of current medical problems.    The patient does have a current PCP (Juluis Mire, MD) and does not need an [facility] hospital follow-up appointment after discharge.  The patient does not have transportation limitations that hinder transportation to clinic appointments.  Services Needed at time of discharge: Y = Yes, Blank = No PT:   OT:   RN:   Equipment:   Other:     LOS: 2 days   Jerene Pitch, MD 05/20/2013, 5:52 PM

## 2013-05-20 NOTE — Progress Notes (Signed)
Patient ID: Jonathan Barker, male   DOB: [birth year], 48 y.o.   MRN: [id number] No more seizures.agrees to stay till his brain bx.

## 2013-05-20 NOTE — Progress Notes (Signed)
Pt cautioned against getting out of the unit to smoke and buy food at the vending machine/cafeteria. MD and RN painstakingly trying to remind him to stay put in the room but pt still insist on going out of the unit.

## 2013-05-21 LAB — BASIC METABOLIC PANEL
BUN: 14 mg/dL (ref 6–23)
CO2: 24 mEq/L (ref 19–32)
Calcium: 8.9 mg/dL (ref 8.4–10.5)
Chloride: 105 mEq/L (ref 96–112)
Creatinine, Ser: 0.8 mg/dL (ref 0.50–1.35)
GFR calc non Af Amer: >90 mL/min (ref >90)
Glucose, Bld: 206 mg/dL — ABNORMAL HIGH (ref 70–99)
Potassium: 4 mEq/L (ref 3.7–5.3)
Sodium: 141 mEq/L (ref 137–147)

## 2013-05-21 LAB — GLUCOSE, CAPILLARY
GLUCOSE-CAPILLARY: 194 mg/dL — AB (ref 70–99)
GLUCOSE-CAPILLARY: 223 mg/dL — AB (ref 70–99)
GLUCOSE-CAPILLARY: 240 mg/dL — AB (ref 70–99)
GLUCOSE-CAPILLARY: 292 mg/dL — AB (ref 70–99)

## 2013-05-21 MED ORDER — INSULIN GLARGINE 100 UNIT/ML ~~LOC~~ SOLN
10.0000 [IU] | Freq: Every day | SUBCUTANEOUS | Status: DC
  Administered 2013-05-21: 10 [IU] via SUBCUTANEOUS
  Filled 2013-05-21 (×2): qty 0.1

## 2013-05-21 NOTE — Progress Notes (Signed)
Subjective: Patient awake and alert.  Has had no further seizures.  Remains on Keppra.  Objective: Current vital signs: BP 91/64  Pulse 79  Temp(Src) 97.7 F (36.5 C) (Oral)  Resp 16  Ht 5\' 11"  (1.803 m)  Wt 83.915 kg (185 lb)  BMI 25.81 kg/m2  SpO2 98% Vital signs in last 24 hours: Temp:  [97.5 F (36.4 C)-97.8 F (36.6 C)] 97.7 F (36.5 C) (01/19 0550) Pulse Rate:  [79-105] 79 (01/19 0550) Resp:  [16-18] 16 (01/19 0550) BP: (91-129)/(64-90) 91/64 mmHg (01/19 0550) SpO2:  [97 %-99 %] 98 % (01/19 0550)  Intake/Output from previous day: 01/18 0701 - 01/19 0700 In: 840 [P.O.:840] Out: -  Intake/Output this shift:   Nutritional status: Carb Control  Neurologic Exam: Mental Status:  Alert, oriented, thought content appropriate. Speech fluent without evidence of aphasia. Able to follow 3 step commands without difficulty.  Cranial Nerves:  II: Discs flat bilaterally; Visual fields grossly normal, pupils equal, round, reactive to light and accommodation  III,IV, VI: ptosis not present, extra-ocular motions intact bilaterally  V,VII: smile symmetric, facial light touch sensation normal bilaterally  VIII: hearing normal bilaterally  IX,X: gag reflex present  XI: bilateral shoulder shrug  XII: midline tongue extension without atrophy or fasciculations  Motor:  5/5 throughout  Sensory: Pinprick and light touch intact throughout, bilaterally  Deep Tendon Reflexes:  2+ throughout  Plantars:  Right: downgoing Left: downgoing  Cerebellar:  normal finger-to-nose, normal heel-to-shin test Gait: Normal   Lab Results: Basic Metabolic Panel:  Recent Labs Lab 05/18/13 1908 05/19/13 0330 05/21/13 0510  NA 139  --  141  K 4.1  --  4.0  CL 101  --  105  CO2 22  --  24  GLUCOSE 227*  --  206*  BUN 14  --  14  CREATININE 0.77  --  0.80  CALCIUM 10.1  --  8.9  MG  --  1.9  --     Liver Function Tests:  Recent Labs Lab 05/18/13 1908  AST 24  ALT 28  ALKPHOS 89   BILITOT 0.2*  PROT 7.5  ALBUMIN 3.9   No results found for this basename: LIPASE, AMYLASE,  in the last 168 hours No results found for this basename: AMMONIA,  in the last 168 hours  CBC:  Recent Labs Lab 05/18/13 1908 05/20/13 0500  WBC 12.3* 10.5  NEUTROABS 7.6  --   HGB 17.2* 16.0  HCT 47.8 46.0  MCV 89.7 89.3  PLT 309 268    Cardiac Enzymes: No results found for this basename: CKTOTAL, CKMB, CKMBINDEX, TROPONINI,  in the last 168 hours  Lipid Panel: No results found for this basename: CHOL, TRIG, HDL, CHOLHDL, VLDL, LDLCALC,  in the last 168 hours  CBG:  Recent Labs Lab 05/20/13 1148 05/20/13 1626 05/20/13 2129 05/21/13 0730 05/21/13 1142  GLUCAP 217* 252* 273* 194* 223*    Microbiology: No results found for this or any previous visit.  Coagulation Studies:  Recent Labs  05/19/13 0330  LABPROT 13.4  INR 1.04    Imaging: No results found.  Medications:  I have reviewed the patient's current medications. Scheduled: . insulin aspart  0-15 Units Subcutaneous TID WC  . insulin aspart  0-5 Units Subcutaneous QHS  . insulin glargine  10 Units Subcutaneous QHS  . levETIRAcetam  500 mg Oral BID  . nicotine  14 mg Transdermal Daily    Assessment/Plan: Seizures controlled.  On Keppra.  Tolerating well.  Still awaiting scheduling for biopsy.    Recommendations: 1.  Continue seizure precautions 2.  Continue Keppra at current dose 3.  Patient unable to drive, operate heavy machinery, perform activities at heights and participate in water activities until release by outpatient physician.   LOS: 3 days   Alexis Goodell, MD [facility] [phone] 05/21/2013  12:25 PM

## 2013-05-21 NOTE — Care Management Note (Unsigned)
    Page 1 of 1   05/22/2013     12:48:44 PM   CARE MANAGEMENT NOTE 05/22/2013  Patient:  Jonathan Barker, Jonathan Barker   Account Number:  [id number]  Date Initiated:  05/21/2013  Documentation initiated by:  GRAVES-BIGELOW,Najia Hurlbutt  Subjective/Objective Assessment:   Pt admitted for new onset seizure. Pt has orange card and is f/u at the Clinic. [facility]. CM did try to call and they were closed.     Action/Plan:   CM will call [facility] 05-22-13 to see if they can dispense Keppra.   Anticipated DC Date:  05/22/2013   Anticipated DC Plan:  [place]  CM consult      Choice offered to / List presented to:             Status of service:  In process, will continue to follow Medicare Important Message given?   (If response is "NO", the following Medicare IM given date fields will be blank) Date Medicare IM given:   Date Additional Medicare IM given:    Discharge Disposition:    Per UR Regulation:  Reviewed for med. necessity/level of care/duration of stay  If discussed at [place] of Stay Meetings, dates discussed:    Comments:  05-22-13 [place], RN, BSN [phone] CM did call the [facility] to see if they carry Keppra 500 mg or XR. Health Dept does not, however the [facility] that is affiliated with the [facility] does. Pt will have to call @ [phone] to set up an appointment for time to fill out application. CM did provide pt with a discount card. Pt will have to pay 25.00 at first and card will pay 30.00. CM did call [facility] and cost is 75.00. With this card pt will have to pay at the most 45.00 total. CM also provided pt with the GoodRx discount coupon. Pt is aware that this is not a guarantee that the medication will be 25.00. Pt is aware that he needs to call the [facility] Program. No further needs from CM at this time.

## 2013-05-21 NOTE — Progress Notes (Signed)
Inpatient Diabetes Program Recommendations  AACE/ADA: New Consensus Statement on Inpatient Glycemic Control (2013)  Target Ranges:  Prepandial:   less than 140 mg/dL      Peak postprandial:   less than 180 mg/dL (1-2 hours)      Critically ill patients:  140 - 180 mg/dL     Results for Jonathan, Barker (MRN [id number]) as of 05/21/2013 12:59  Ref. Range 05/20/2013 08:13 05/20/2013 11:48 05/20/2013 16:26 05/20/2013 21:29  Glucose-Capillary Latest Range: 70-99 mg/dL 198 (H) 217 (H) 252 (H) 273 (H)    Results for Jonathan, Barker (MRN [id number]) as of 05/21/2013 12:59  Ref. Range 05/21/2013 07:30 05/21/2013 11:42  Glucose-Capillary Latest Range: 70-99 mg/dL 194 (H) 223 (H)    Results for Jonathan, Barker (MRN [id number]) as of 05/21/2013 12:59  Ref. Range 05/19/2013 03:30  Hemoglobin A1C Latest Range: <5.7 % 11.1 (H)    **A1c shows extremely poor CBG control at home.  Has not been taking Metformin consistently at home.  **Noted Lantus increased to 10 units QHS today (will get increased dose tonight).   **MD- Please consider adding Novolog meal coverage to in-hospital regimen- Novolog 4 units tid with meals   Will follow. Wyn Quaker RN, MSN, CDE Diabetes Coordinator Inpatient Diabetes Program Team Pager: [phone] (8a-10p)

## 2013-05-21 NOTE — Progress Notes (Signed)
Subjective: Jonathan Barker is feeling fine this morning, no complaints. No further seizure activity. He is willing to stay until the biopsy as long as it is soon.  Objective: Vital signs in last 24 hours: Filed Vitals:   05/20/13 0602 05/20/13 1355 05/20/13 2100 05/21/13 0550  BP: 120/79 118/83 129/90 91/64  Pulse: 65 85 105 79  Temp: 97.9 F (36.6 C) 97.5 F (36.4 C) 97.8 F (36.6 C) 97.7 F (36.5 C)  TempSrc: Oral Oral Oral   Resp: 15 18 16 16   Height:      Weight:      SpO2: 95% 99% 97% 98%   Weight change:   Intake/Output Summary (Last 24 hours) at 05/21/13 1248 Last data filed at 05/20/13 1300  Gross per 24 hour  Intake    360 ml  Output      0 ml  Net    360 ml   Physical exam: General: sitting on edge of bed and intermittently walking around his room, NAD HEENT: NCAT, vision grossly intact Cardiac: RRR, no rubs, murmurs or gallops Pulm: CTAB Abd: soft, nontender, nondistended, BS present Ext: warm and well perfused, no BLE edema Neuro: alert and oriented X3, cranial nerves II-XII grossly intact, walking and moving all extremities  Lab Results: Basic Metabolic Panel:  Recent Labs Lab 05/18/13 1908 05/19/13 0330 05/21/13 0510  NA 139  --  141  K 4.1  --  4.0  CL 101  --  105  CO2 22  --  24  GLUCOSE 227*  --  206*  BUN 14  --  14  CREATININE 0.77  --  0.80  CALCIUM 10.1  --  8.9  MG  --  1.9  --    Liver Function Tests:  Recent Labs Lab 05/18/13 1908  AST 24  ALT 28  ALKPHOS 89  BILITOT 0.2*  PROT 7.5  ALBUMIN 3.9   CBC:  Recent Labs Lab 05/18/13 1908 05/20/13 0500  WBC 12.3* 10.5  NEUTROABS 7.6  --   HGB 17.2* 16.0  HCT 47.8 46.0  MCV 89.7 89.3  PLT 309 268   CBG:  Recent Labs Lab 05/20/13 0813 05/20/13 1148 05/20/13 1626 05/20/13 2129 05/21/13 0730 05/21/13 1142  GLUCAP 198* 217* 252* 273* 194* 223*   Hemoglobin A1C:  Recent Labs Lab 05/19/13 0330  HGBA1C 11.1*   Coagulation:  Recent Labs Lab  05/19/13 0330  LABPROT 13.4  INR 1.04   Urine Drug Screen: Drugs of Abuse     Component Value Date/Time   LABOPIA NONE DETECTED 05/18/2013 1953   COCAINSCRNUR NONE DETECTED 05/18/2013 1953   LABBENZ NONE DETECTED 05/18/2013 1953   AMPHETMU NONE DETECTED 05/18/2013 1953   THCU POSITIVE* 05/18/2013 1953   LABBARB NONE DETECTED 05/18/2013 1953    Urinalysis:  Recent Labs Lab 05/18/13 1953  COLORURINE YELLOW  LABSPEC 1.034*  PHURINE 6.5  GLUCOSEU >1000*  HGBUR NEGATIVE  BILIRUBINUR NEGATIVE  KETONESUR NEGATIVE  PROTEINUR NEGATIVE  UROBILINOGEN 1.0  NITRITE NEGATIVE  LEUKOCYTESUR NEGATIVE   Studies/Results: No results found. Medications: I have reviewed the patient's current medications. Scheduled Meds: . insulin aspart  0-15 Units Subcutaneous TID WC  . insulin aspart  0-5 Units Subcutaneous QHS  . insulin glargine  10 Units Subcutaneous QHS  . levETIRAcetam  500 mg Oral BID  . nicotine  14 mg Transdermal Daily   Continuous Infusions:   PRN Meds:.acetaminophen, acetaminophen, LORazepam  Assessment/Plan:  New onset seizure w/ right frontal brain mass; ?  Primary low grade astrocytoma: No more seizure activity since admission.  Neurosurgery recommends biopsy, but unclear when. Neurology also following, tolerating keppra.  -Seizure precautions -continue keppra -appreciate neurology and neurosurgery following -need to confirm date for biopsy ( I placed call to neurosurg and now waiting call back)   Elevated AG: Resolved. Ag 16 on admission, likely 2/2 seizure. AG is 12 today.  DM type 2:  A1c 11.1 on admission. CBGs still elevated 206-273 after lantus 8U last night.  Was supposed to be on metformin 1000BID at home, though is noncompliant. -increase lantus to 10U qhs -SSI moderate -continue hs sliding scale -cbg monitoring  Tobacco abuse: admits to smoking 2 packs per day. Says he will try to not smoke in the hospital, refuses patch as it makes him sick. Still walks off  the floor at times to smoke.  -counseled extensively on smoking cessation and NOT smoking in the hospital  VTE: will need to ask neurosurgery, SCDs for now. Patient is ambulating.  Diet: carb mod Code status: full  Dispo: Disposition is deferred at this time, awaiting improvement of current medical problems.    The patient does have a current PCP (Jonathan Mire, MD) and does not need an [facility] hospital follow-up appointment after discharge.  The patient does not have transportation limitations that hinder transportation to clinic appointments.  Services Needed at time of discharge: Y = Yes, Blank = No PT:   OT:   RN:   Equipment:   Other:     LOS: 3 days   Rebecca Eaton, MD 05/21/2013, 12:48 PM

## 2013-05-21 NOTE — Progress Notes (Signed)
Internal Medicine Attending  Date: 05/21/2013  Patient name: Jonathan Barker Medical record number: [id number] Date of birth: 12-21-1965 Age: 48 y.o. Gender: male  I saw and evaluated the patient. I reviewed the resident's note by Dr. Mechele Claude and I agree with the resident's findings and plans as documented in her note.

## 2013-05-21 NOTE — Progress Notes (Signed)
UR Completed Rabecca Birge Graves-Bigelow, RN,BSN [phone]  

## 2013-05-21 NOTE — Progress Notes (Signed)
Patient ID: Jonathan Barker, male   DOB: [birth year], 48 y.o.   MRN: [id number] BP 120/80  Pulse 94  Temp(Src) 98.1 F (36.7 C) (Oral)  Resp 16  Ht 5\' 11"  (1.803 m)  Wt 83.915 kg (185 lb)  BMI 25.81 kg/m2  SpO2 96% Alert and oriented x 4, speech is clear and fluent Perrl, full eom Moving all extremities well I spoke at length with [name] this evening and explained the procedure. The risks including stroke, coma, death, non diagnostic biopsy, brain damage,weakness, change in personality, and other risks. He understands and wishes to proceed either this Wednesday or Thursday

## 2013-05-21 NOTE — Progress Notes (Signed)
Patient ID: Jonathan Barker, male   DOB: Mar 06, 1966, 48 y.o.   MRN: [id number] My partner dr Christella Noa to take over his care.

## 2013-05-22 ENCOUNTER — Other Ambulatory Visit: Payer: Self-pay | Admitting: Neurosurgery

## 2013-05-22 LAB — GLUCOSE, CAPILLARY
GLUCOSE-CAPILLARY: 173 mg/dL — AB (ref 70–99)
GLUCOSE-CAPILLARY: 249 mg/dL — AB (ref 70–99)
Glucose-Capillary: 314 mg/dL — ABNORMAL HIGH (ref 70–99)
Glucose-Capillary: 324 mg/dL — ABNORMAL HIGH (ref 70–99)

## 2013-05-22 MED ORDER — INSULIN GLARGINE 100 UNIT/ML ~~LOC~~ SOLN
15.0000 [IU] | Freq: Every day | SUBCUTANEOUS | Status: DC
  Administered 2013-05-22: 15 [IU] via SUBCUTANEOUS
  Filled 2013-05-22 (×2): qty 0.15

## 2013-05-22 NOTE — Progress Notes (Signed)
Internal Medicine Attending  Date: 05/22/2013  Patient name: Jonathan Barker Medical record number: [id number] Date of birth: 08/27/1965 Age: 48 y.o. Gender: male  I saw and evaluated the patient, and discussed his care on A.M rounds with housestaff.  I reviewed the resident's note by Dr. Mechele Claude and I agree with the resident's findings and plans as documented in her note.

## 2013-05-22 NOTE — Progress Notes (Signed)
Subjective: Jonathan Barker is feeling fine this morning, no complaints. He is up and walking around. He is nervous for his procedure, which will occur either Wednesday or Thursday this week.   Objective: Vital signs in last 24 hours: Filed Vitals:   05/21/13 0550 05/21/13 1417 05/21/13 2056 05/22/13 0500  BP: 91/64 120/80 109/73 120/80  Pulse: 79 94 73 79  Temp: 97.7 F (36.5 C) 98.1 F (36.7 C) 98.2 F (36.8 C) 98.1 F (36.7 C)  TempSrc:  Oral Oral Oral  Resp: 16 16    Height:      Weight:      SpO2: 98% 96% 96% 96%   Weight change:  No intake or output data in the 24 hours ending 05/22/13 1146  Physical exam: General: sitting on edge of bed and intermittently walking around his room, NAD HEENT: NCAT, vision grossly intact Cardiac: RRR, no rubs, murmurs or gallops Pulm: CTAB Abd: soft, nontender, nondistended, BS present Ext: warm and well perfused, no BLE edema Neuro: alert and oriented X3, cranial nerves II-XII grossly intact, walking and moving all extremities  Lab Results: Basic Metabolic Panel:  Recent Labs Lab 05/18/13 1908 05/19/13 0330 05/21/13 0510  NA 139  --  141  K 4.1  --  4.0  CL 101  --  105  CO2 22  --  24  GLUCOSE 227*  --  206*  BUN 14  --  14  CREATININE 0.77  --  0.80  CALCIUM 10.1  --  8.9  MG  --  1.9  --    Liver Function Tests:  Recent Labs Lab 05/18/13 1908  AST 24  ALT 28  ALKPHOS 89  BILITOT 0.2*  PROT 7.5  ALBUMIN 3.9   CBC:  Recent Labs Lab 05/18/13 1908 05/20/13 0500  WBC 12.3* 10.5  NEUTROABS 7.6  --   HGB 17.2* 16.0  HCT 47.8 46.0  MCV 89.7 89.3  PLT 309 268   CBG:  Recent Labs Lab 05/20/13 2129 05/21/13 0730 05/21/13 1142 05/21/13 1646 05/21/13 2206 05/22/13 0906  GLUCAP 273* 194* 223* 240* 292* 314*   Hemoglobin A1C:  Recent Labs Lab 05/19/13 0330  HGBA1C 11.1*   Coagulation:  Recent Labs Lab 05/19/13 0330  LABPROT 13.4  INR 1.04   Urine Drug Screen: Drugs of Abuse       Component Value Date/Time   LABOPIA NONE DETECTED 05/18/2013 1953   COCAINSCRNUR NONE DETECTED 05/18/2013 1953   LABBENZ NONE DETECTED 05/18/2013 1953   AMPHETMU NONE DETECTED 05/18/2013 1953   THCU POSITIVE* 05/18/2013 1953   LABBARB NONE DETECTED 05/18/2013 1953    Urinalysis:  Recent Labs Lab 05/18/13 1953  COLORURINE YELLOW  LABSPEC 1.034*  PHURINE 6.5  GLUCOSEU >1000*  HGBUR NEGATIVE  BILIRUBINUR NEGATIVE  KETONESUR NEGATIVE  PROTEINUR NEGATIVE  UROBILINOGEN 1.0  NITRITE NEGATIVE  LEUKOCYTESUR NEGATIVE   Studies/Results: No results found. Medications: I have reviewed the patient's current medications. Scheduled Meds: . insulin aspart  0-15 Units Subcutaneous TID WC  . insulin aspart  0-5 Units Subcutaneous QHS  . insulin glargine  15 Units Subcutaneous QHS  . levETIRAcetam  500 mg Oral BID  . nicotine  14 mg Transdermal Daily   Continuous Infusions:   PRN Meds:.acetaminophen, acetaminophen, LORazepam  Assessment/Plan:  New onset seizure w/ right frontal brain mass; ? Primary low grade astrocytoma: No more seizure activity since admission.  Neurosurgery recommends biopsy- to occur Wed or Thurs this week. Neurology also following. He is tolerating  keppra well. -Seizure precautions -continue keppra 500mg  PO BID -appreciate neurology and neurosurgery following -NPO at midnight  Elevated AG: Resolved.   DM type 2:  A1c 11.1 on admission. CBGs still elevated 240-292 after lantus 10U last night.  Was supposed to be on metformin 1000BID at home, though is noncompliant. -increase lantus to 15U qhs -SSI moderate -continue hs sliding scale -cbg monitoring  Tobacco abuse: admits to smoking 2 packs per day. Says he will try to not smoke in the hospital, refuses patch as it makes him sick. Still walks off the floor at times to smoke.  -counseled extensively on smoking cessation and NOT smoking in the hospital  VTE: SCDs for now. Patient is ambulating.  Diet: carb  mod Code status: full  Dispo: Disposition is deferred at this time, awaiting improvement of current medical problems.    The patient does have a current PCP (Jonathan Mire, MD) and does not need an [facility] hospital follow-up appointment after discharge.  The patient does not have transportation limitations that hinder transportation to clinic appointments.  Services Needed at time of discharge: Y = Yes, Blank = No PT:   OT:   RN:   Equipment:   Other:     LOS: 4 days   Rebecca Eaton, MD 05/22/2013, 11:46 AM

## 2013-05-22 NOTE — Progress Notes (Signed)
Patient ID: Jonathan Barker, male   DOB: 1965/05/11, 48 y.o.   MRN: [id number] BP 113/72  Pulse 94  Temp(Src) 98.3 F (36.8 C) (Oral)  Resp 17  Ht 5\' 11"  (1.803 m)  Wt 83.915 kg (185 lb)  BMI 25.81 kg/m2  SpO2 97% Alert and oriented Will schedule surgery on Thursday 05/24/13. Will proceed with an open biopsy. Will order CT scan for stealth study to be performed tomorrow.

## 2013-05-22 NOTE — Progress Notes (Signed)
Subjective: Patient stable.  To have biopsy within the next 1-2 days.  No further seizures noted.  Objective: Current vital signs: BP 120/80  Pulse 79  Temp(Src) 98.1 F (36.7 C) (Oral)  Resp 16  Ht 5\' 11"  (1.803 m)  Wt 83.915 kg (185 lb)  BMI 25.81 kg/m2  SpO2 96% Vital signs in last 24 hours: Temp:  [98.1 F (36.7 C)-98.2 F (36.8 C)] 98.1 F (36.7 C) (01/20 0500) Pulse Rate:  [73-94] 79 (01/20 0500) Resp:  [16] 16 (01/19 1417) BP: (109-120)/(73-80) 120/80 mmHg (01/20 0500) SpO2:  [96 %] 96 % (01/20 0500)  Intake/Output from previous day:   Intake/Output this shift:   Nutritional status: Carb Control  Neurologic Exam: Mental Status:  Alert, oriented, thought content appropriate. Speech fluent without evidence of aphasia. Able to follow 3 step commands without difficulty.  Cranial Nerves:  II: Discs flat bilaterally; Visual fields grossly normal, pupils equal, round, reactive to light and accommodation  III,IV, VI: ptosis not present, extra-ocular motions intact bilaterally  V,VII: smile symmetric, facial light touch sensation normal bilaterally  VIII: hearing normal bilaterally  IX,X: gag reflex present  XI: bilateral shoulder shrug  XII: midline tongue extension without atrophy or fasciculations  Motor:  5/5 throughout  Sensory: Pinprick and light touch intact throughout, bilaterally  Deep Tendon Reflexes:  2+ throughout  Plantars:  Right: downgoing Left: downgoing  Cerebellar:  normal finger-to-nose, normal heel-to-shin test  Gait: Normal   Lab Results: Basic Metabolic Panel:  Recent Labs Lab 05/18/13 1908 05/19/13 0330 05/21/13 0510  NA 139  --  141  K 4.1  --  4.0  CL 101  --  105  CO2 22  --  24  GLUCOSE 227*  --  206*  BUN 14  --  14  CREATININE 0.77  --  0.80  CALCIUM 10.1  --  8.9  MG  --  1.9  --     Liver Function Tests:  Recent Labs Lab 05/18/13 1908  AST 24  ALT 28  ALKPHOS 89  BILITOT 0.2*  PROT 7.5  ALBUMIN 3.9   No  results found for this basename: LIPASE, AMYLASE,  in the last 168 hours No results found for this basename: AMMONIA,  in the last 168 hours  CBC:  Recent Labs Lab 05/18/13 1908 05/20/13 0500  WBC 12.3* 10.5  NEUTROABS 7.6  --   HGB 17.2* 16.0  HCT 47.8 46.0  MCV 89.7 89.3  PLT 309 268    Cardiac Enzymes: No results found for this basename: CKTOTAL, CKMB, CKMBINDEX, TROPONINI,  in the last 168 hours  Lipid Panel: No results found for this basename: CHOL, TRIG, HDL, CHOLHDL, VLDL, LDLCALC,  in the last 168 hours  CBG:  Recent Labs Lab 05/21/13 0730 05/21/13 1142 05/21/13 1646 05/21/13 2206 05/22/13 0906  GLUCAP 194* 223* 240* 292* 314*    Microbiology: No results found for this or any previous visit.  Coagulation Studies: No results found for this basename: LABPROT, INR,  in the last 72 hours  Imaging: No results found.  Medications:  I have reviewed the patient's current medications. Scheduled: . insulin aspart  0-15 Units Subcutaneous TID WC  . insulin aspart  0-5 Units Subcutaneous QHS  . insulin glargine  10 Units Subcutaneous QHS  . levETIRAcetam  500 mg Oral BID  . nicotine  14 mg Transdermal Daily    Assessment/Plan: No further seizures.  Biopsy pending.    Recommendations: 1.  Continue Keppra at current  dose 2.  Agree with biopsy.   LOS: 4 days   Alexis Goodell, MD [facility] [phone] 05/22/2013  10:47 AM

## 2013-05-22 NOTE — Progress Notes (Signed)
When I arrived in the room to do my morning assessment, pt was n't there. He returned to the room about 10 minutes later. He went to smoke. I educated pt not to leave the unit but he politely refuses to stay in room. Pt educated about smoking cessation

## 2013-05-22 NOTE — Progress Notes (Signed)
Inpatient Diabetes Program Recommendations  AACE/ADA: New Consensus Statement on Inpatient Glycemic Control (2013)  Target Ranges:  Prepandial:   less than 140 mg/dL      Peak postprandial:   less than 180 mg/dL (1-2 hours)      Critically ill patients:  140 - 180 mg/dL     Results for Jonathan Barker, Jonathan Barker (MRN [id number]) as of 05/22/2013 15:33  Ref. Range 05/19/2013 03:30  Hemoglobin A1C Latest Range: <5.7 % 11.1 (H)    **Patient admitted with brain mass.  Scheduled for brain biopsy tomorrow.  **Extremely poor blood glucose control at home as evidenced by A1c of 11.1%.  Have attempted to speak with patient twice this admission about his DM control at home, however, both times patient was off unit outside of hospital smoking.    **Patient will likely need insulin at d/c.   Will follow. Wyn Quaker RN, MSN, CDE Diabetes Coordinator Inpatient Diabetes Program Team Pager: [phone] (8a-10p)

## 2013-05-23 ENCOUNTER — Inpatient Hospital Stay (HOSPITAL_COMMUNITY): Payer: No Typology Code available for payment source

## 2013-05-23 LAB — GLUCOSE, CAPILLARY
GLUCOSE-CAPILLARY: 236 mg/dL — AB (ref 70–99)
Glucose-Capillary: 210 mg/dL — ABNORMAL HIGH (ref 70–99)
Glucose-Capillary: 231 mg/dL — ABNORMAL HIGH (ref 70–99)

## 2013-05-23 MED ORDER — IOHEXOL 300 MG/ML  SOLN
100.0000 mL | Freq: Once | INTRAMUSCULAR | Status: AC | PRN
  Administered 2013-05-23: 100 mL via INTRAVENOUS

## 2013-05-23 MED ORDER — PHENYTOIN SODIUM 50 MG/ML IJ SOLN
850.0000 mg | INTRAMUSCULAR | Status: AC
  Administered 2013-05-23: 850 mg via INTRAVENOUS
  Filled 2013-05-23: qty 17

## 2013-05-23 MED ORDER — CEFAZOLIN SODIUM-DEXTROSE 2-3 GM-% IV SOLR
2.0000 g | INTRAVENOUS | Status: AC
  Administered 2013-05-24: 2 g via INTRAVENOUS
  Filled 2013-05-23: qty 50

## 2013-05-23 MED ORDER — PHENYTOIN SODIUM EXTENDED 100 MG PO CAPS
300.0000 mg | ORAL_CAPSULE | Freq: Every day | ORAL | Status: DC
  Administered 2013-05-23 – 2013-05-24 (×2): 300 mg via ORAL
  Filled 2013-05-23 (×4): qty 3

## 2013-05-23 MED ORDER — INSULIN GLARGINE 100 UNIT/ML ~~LOC~~ SOLN
20.0000 [IU] | Freq: Every day | SUBCUTANEOUS | Status: DC
  Administered 2013-05-23 – 2013-05-24 (×2): 20 [IU] via SUBCUTANEOUS
  Filled 2013-05-23 (×4): qty 0.2

## 2013-05-23 NOTE — Progress Notes (Signed)
Patient ID: Jonathan Barker, male   DOB: Aug 30, 1965, 48 y.o.   MRN: [id number] BP 126/71  Pulse 88  Temp(Src) 97.9 F (36.6 C) (Oral)  Resp 16  Ht 5\' 11"  (1.803 m)  Wt 83.915 kg (185 lb)  BMI 25.81 kg/m2  SpO2 99% Alert and oriented x 4 speech is clear and fluent For OR tomorrow, right frontal craniotomy for biopsy Risks including but not limited to bleeding, infection, stroke, coma, death, weakness, change in personality, non diagnostic biopsy, brain damage were explained. He understands and wishes to proceed.

## 2013-05-23 NOTE — Progress Notes (Signed)
Subjective: Jonathan Barker has no complaints this morning other than he is hungry. He is nervous for his procedure, which will occur tomorrow. No further seizure-like activity.   Objective: Vital signs in last 24 hours: Filed Vitals:   05/22/13 1300 05/22/13 2009 05/23/13 0534 05/23/13 1410  BP: 113/72 122/79 113/73 126/71  Pulse: 94 108 78 88  Temp: 98.3 F (36.8 C) 98.1 F (36.7 C) 98 F (36.7 C) 97.9 F (36.6 C)  TempSrc: Oral Oral Oral   Resp: 17 20 18 16   Height:      Weight:      SpO2: 97% 99% 97% 99%   Weight change:  No intake or output data in the 24 hours ending 05/23/13 1419  Physical exam: General: sitting on edge of bed and intermittently walking around his room, NAD HEENT: NCAT, vision grossly intact Cardiac: RRR, no rubs, murmurs or gallops Pulm: CTAB Abd: soft, nontender, nondistended, BS present Ext: warm and well perfused, no BLE edema Neuro: alert and oriented X3, cranial nerves II-XII grossly intact, walking and moving all extremities  Lab Results: Basic Metabolic Panel:  Recent Labs Lab 05/18/13 1908 05/19/13 0330 05/21/13 0510  NA 139  --  141  K 4.1  --  4.0  CL 101  --  105  CO2 22  --  24  GLUCOSE 227*  --  206*  BUN 14  --  14  CREATININE 0.77  --  0.80  CALCIUM 10.1  --  8.9  MG  --  1.9  --    Liver Function Tests:  Recent Labs Lab 05/18/13 1908  AST 24  ALT 28  ALKPHOS 89  BILITOT 0.2*  PROT 7.5  ALBUMIN 3.9   CBC:  Recent Labs Lab 05/18/13 1908 05/20/13 0500  WBC 12.3* 10.5  NEUTROABS 7.6  --   HGB 17.2* 16.0  HCT 47.8 46.0  MCV 89.7 89.3  PLT 309 268   CBG:  Recent Labs Lab 05/22/13 0906 05/22/13 1146 05/22/13 1713 05/22/13 2005 05/23/13 0740 05/23/13 1127  GLUCAP 314* 173* 249* 324* 210* 231*   Hemoglobin A1C:  Recent Labs Lab 05/19/13 0330  HGBA1C 11.1*   Coagulation:  Recent Labs Lab 05/19/13 0330  LABPROT 13.4  INR 1.04   Urine Drug Screen: Drugs of Abuse     Component Value  Date/Time   LABOPIA NONE DETECTED 05/18/2013 1953   COCAINSCRNUR NONE DETECTED 05/18/2013 1953   LABBENZ NONE DETECTED 05/18/2013 1953   AMPHETMU NONE DETECTED 05/18/2013 1953   THCU POSITIVE* 05/18/2013 1953   LABBARB NONE DETECTED 05/18/2013 1953    Urinalysis:  Recent Labs Lab 05/18/13 1953  COLORURINE YELLOW  LABSPEC 1.034*  PHURINE 6.5  GLUCOSEU >1000*  HGBUR NEGATIVE  BILIRUBINUR NEGATIVE  KETONESUR NEGATIVE  PROTEINUR NEGATIVE  UROBILINOGEN 1.0  NITRITE NEGATIVE  LEUKOCYTESUR NEGATIVE   Studies/Results: Ct Head W Contrast  05/23/2013   CLINICAL DATA:  Stealth protocol. Recent seizure with right foraminal brain mass on imaging.  EXAM: CT HEAD WITH CONTRAST  TECHNIQUE: Contiguous axial images were obtained from the base of the skull through the vertex with intravenous contrast.  CONTRAST:  165mL OMNIPAQUE IOHEXOL 300 MG/ML  SOLN  COMPARISON:  Head CT and brain MRI 05/18/2013  FINDINGS: Infiltrative right frontal lobe mass is better demonstrated on recent MRI. Again seen are areas of ill defined hypoattenuation within right frontal white matter which may reflect edema or infiltrative tumor, as well as areas of abnormal soft tissue density within the  more superior aspect of the right frontal lobe corresponding to the known lesion. There is no midline shift. Ventricles are unchanged without evidence of hydrocephalus. No definite abnormal enhancement is identified. There is no evidence of intracranial hemorrhage or extra-axial fluid collection. Mastoid air cells are clear. Minimal maxillary sinus mucosal thickening is noted. Orbits are unremarkable.  IMPRESSION: Successful preoperative localization of infiltrative right frontal lobe mass.   Electronically Signed   By: Logan Bores   On: 05/23/2013 09:48   Medications: I have reviewed the patient's current medications. Scheduled Meds: . insulin aspart  0-15 Units Subcutaneous TID WC  . insulin aspart  0-5 Units Subcutaneous QHS  . insulin  glargine  15 Units Subcutaneous QHS  . nicotine  14 mg Transdermal Daily  . phenytoin (DILANTIN) IV  850 mg Intravenous STAT  . 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 Primary low grade astrocytoma: No more seizure activity since admission.  Neurosurgery recommends biopsy- to occur tomorrow. Neurology saw patient this morning and decided to switch patient over to dilantin given lower cost of this medication compared to keppra. Pt loaded with 850mg  IV dilantin, then will continue on Dilantin 300 mg QHS. He will need outpatient f/u with neurology to check dilantin level (phone number in neuro note). -Seizure precautions -continue dilantin 300mg  qHS -appreciate neurology and neurosurgery following -NPO at midnight  Elevated AG: Resolved.   DM type 2:  A1c 11.1 on admission. CBGs still elevated 249-324 after lantus 15U last night.  Was supposed to be on metformin 1000BID at home, though is noncompliant. -increase lantus to 20U qhs -SSI moderate -continue hs sliding scale -cbg monitoring  Tobacco abuse: admits to smoking 2 packs per day. Says he will try to not smoke in the hospital, refuses patch as it makes him sick. Still walks off the floor at times to smoke.  -counseled extensively on smoking cessation and NOT smoking in the hospital  VTE: SCDs for now. Patient is ambulating.  Diet: carb mod Code status: full  Dispo: Disposition is deferred at this time, awaiting improvement of current medical problems.    The patient does have a current PCP (Juluis Mire, MD) and does not need an [facility] hospital follow-up appointment after discharge.  The patient does not have transportation limitations that hinder transportation to clinic appointments.  Services Needed at time of discharge: Y = Yes, Blank = No PT:   OT:   RN:   Equipment:   Other:     LOS: 5  days   Rebecca Eaton, MD 05/23/2013, 2:19 PM

## 2013-05-23 NOTE — Progress Notes (Addendum)
NEURO HOSPITALIST PROGRESS NOTE   SUBJECTIVE:                                                                                                                         No further seizures.  Patient states he is unable to afford the [place] at discharge.  Discussed with patient the ability to go on Dilantin which would be cheaper and he is in agreement to be on Dilantin.  OBJECTIVE:                                                                                                                           Vital signs in last 24 hours: Temp:  [98 F (36.7 C)-98.3 F (36.8 C)] 98 F (36.7 C) (01/21 0534) Pulse Rate:  [78-108] 78 (01/21 0534) Resp:  [17-20] 18 (01/21 0534) BP: (113-122)/(72-79) 113/73 mmHg (01/21 0534) SpO2:  [97 %-99 %] 97 % (01/21 0534)  Intake/Output from previous day:   Intake/Output this shift:   Nutritional status: Carb Control  Past Medical History  Diagnosis Date  . Shortness of breath   . Hepatitis   . DM (diabetes mellitus) 08/09/2011    A1c at diagnosis = 12.4  . GERD (gastroesophageal reflux disease) 08/09/2011  . Seizure 05/19/2013      Neurologic Exam:  Mental Status: Alert, oriented, thought content appropriate.  Speech fluent without evidence of aphasia.  Able to follow 3 step commands without difficulty. Cranial Nerves: II: Discs flat bilaterally; Visual fields grossly normal, pupils equal, round, reactive to light and accommodation III,IV, VI: ptosis not present, extra-ocular motions intact bilaterally V,VII: smile symmetric, facial light touch sensation normal bilaterally VIII: hearing normal bilaterally IX,X: gag reflex present XI: bilateral shoulder shrug XII: midline tongue extension without atrophy or fasciculations  Motor: Right : Upper extremity   5/5    Left:     Upper extremity   5/5  Lower extremity   5/5     Lower extremity   5/5 Tone and bulk:normal tone throughout; no atrophy noted Sensory:  Pinprick and light touch intact throughout, bilaterally Deep Tendon Reflexes:  Right: Upper Extremity   Left: Upper extremity   biceps (C-5 to C-6) 2/4   biceps (C-5 to C-6) 2/4  tricep (C7) 2/4    triceps (C7) 2/4 Brachioradialis (C6) 2/4  Brachioradialis (C6) 2/4  Lower Extremity Lower Extremity  quadriceps (L-2 to L-4) 2/4   quadriceps (L-2 to L-4) 2/4 Achilles (S1) 2/4   Achilles (S1) 2/4  Plantars: Right: downgoing   Left: downgoing Cerebellar: normal finger-to-nose,  normal heel-to-shin test  Lab Results: Basic Metabolic Panel:  Recent Labs Lab 05/18/13 1908 05/19/13 0330 05/21/13 0510  NA 139  --  141  K 4.1  --  4.0  CL 101  --  105  CO2 22  --  24  GLUCOSE 227*  --  206*  BUN 14  --  14  CREATININE 0.77  --  0.80  CALCIUM 10.1  --  8.9  MG  --  1.9  --     Liver Function Tests:  Recent Labs Lab 05/18/13 1908  AST 24  ALT 28  ALKPHOS 89  BILITOT 0.2*  PROT 7.5  ALBUMIN 3.9   No results found for this basename: LIPASE, AMYLASE,  in the last 168 hours No results found for this basename: AMMONIA,  in the last 168 hours  CBC:  Recent Labs Lab 05/18/13 1908 05/20/13 0500  WBC 12.3* 10.5  NEUTROABS 7.6  --   HGB 17.2* 16.0  HCT 47.8 46.0  MCV 89.7 89.3  PLT 309 268    Cardiac Enzymes: No results found for this basename: CKTOTAL, CKMB, CKMBINDEX, TROPONINI,  in the last 168 hours  Lipid Panel: No results found for this basename: CHOL, TRIG, HDL, CHOLHDL, VLDL, LDLCALC,  in the last 168 hours  CBG:  Recent Labs Lab 05/22/13 1146 05/22/13 1713 05/22/13 2005 05/23/13 0740 05/23/13 1127  GLUCAP 173* 249* 324* 210* 231*    Microbiology: No results found for this or any previous visit.  Coagulation Studies: No results found for this basename: LABPROT, INR,  in the last 72 hours  Imaging: Ct Head W Contrast  05/23/2013   CLINICAL DATA:  Stealth protocol. Recent seizure with right foraminal brain mass on imaging.  EXAM: CT HEAD WITH  CONTRAST  TECHNIQUE: Contiguous axial images were obtained from the base of the skull through the vertex with intravenous contrast.  CONTRAST:  177mL OMNIPAQUE IOHEXOL 300 MG/ML  SOLN  COMPARISON:  Head CT and brain MRI 05/18/2013  FINDINGS: Infiltrative right frontal lobe mass is better demonstrated on recent MRI. Again seen are areas of ill defined hypoattenuation within right frontal white matter which may reflect edema or infiltrative tumor, as well as areas of abnormal soft tissue density within the more superior aspect of the right frontal lobe corresponding to the known lesion. There is no midline shift. Ventricles are unchanged without evidence of hydrocephalus. No definite abnormal enhancement is identified. There is no evidence of intracranial hemorrhage or extra-axial fluid collection. Mastoid air cells are clear. Minimal maxillary sinus mucosal thickening is noted. Orbits are unremarkable.  IMPRESSION: Successful preoperative localization of infiltrative right frontal lobe mass.   Electronically Signed   By: Logan Bores   On: 05/23/2013 09:48       MEDICATIONS  Scheduled: . insulin aspart  0-15 Units Subcutaneous TID WC  . insulin aspart  0-5 Units Subcutaneous QHS  . insulin glargine  15 Units Subcutaneous QHS  . nicotine  14 mg Transdermal Daily  . phenytoin (DILANTIN) IV  850 mg Intravenous STAT  . phenytoin  300 mg Oral QHS    ASSESSMENT/PLAN:                                                                                                           Continues to be seizure free.  Due to cost of Keppra will change him to Dilantin.  Will load with 850 mg IV now and continue with 300 mg QHS after and at discharge.   Recommend: 1) Continue Dilantin 300 mg QHS 2) Follow up with Neurology as out patient to assess Dilantin level ---[facility] [address] [place], [place]  [address]--[phone] or  [facility] neurology [place], [place], [place] [address] [phone]  Discussed with resident program  Neurology S/O  Assessment and plan discussed with with attending physician and they are in agreement.    Etta Quill PA-C [facility] [phone]  05/23/2013, 12:51 PM

## 2013-05-23 NOTE — Progress Notes (Signed)
Inpatient Diabetes Program Recommendations  AACE/ADA: New Consensus Statement on Inpatient Glycemic Control (2013)  Target Ranges:  Prepandial:   less than 140 mg/dL      Peak postprandial:   less than 180 mg/dL (1-2 hours)      Critically ill patients:  140 - 180 mg/dL   Continued hyperglycemia with high fasting glucose Please continue to increase the Lantus by 5 unit increments until fastings are at least </= 150 mg/dL.    Thank you, Rosita Kea, RN, CNS, Diabetes Coordinator [phone])

## 2013-05-24 ENCOUNTER — Encounter (HOSPITAL_COMMUNITY): Payer: Self-pay | Admitting: Critical Care Medicine

## 2013-05-24 ENCOUNTER — Inpatient Hospital Stay (HOSPITAL_COMMUNITY): Payer: No Typology Code available for payment source | Admitting: Critical Care Medicine

## 2013-05-24 ENCOUNTER — Encounter (HOSPITAL_COMMUNITY)
Admission: EM | Disposition: A | Discharge status: Home / Self Care | Payer: Self-pay | Source: Home / Self Care | Attending: Internal Medicine

## 2013-05-24 ENCOUNTER — Encounter (HOSPITAL_COMMUNITY): Payer: MEDICAID | Admitting: Critical Care Medicine

## 2013-05-24 DIAGNOSIS — D496 Neoplasm of unspecified behavior of brain: Secondary | ICD-10-CM | POA: Diagnosis present

## 2013-05-24 HISTORY — PX: BRAIN BIOPSY: SHX6409

## 2013-05-24 LAB — GLUCOSE, CAPILLARY
GLUCOSE-CAPILLARY: 195 mg/dL — AB (ref 70–99)
Glucose-Capillary: 159 mg/dL — ABNORMAL HIGH (ref 70–99)
Glucose-Capillary: 168 mg/dL — ABNORMAL HIGH (ref 70–99)

## 2013-05-24 LAB — MRSA PCR SCREENING: MRSA by PCR: NEGATIVE

## 2013-05-24 SURGERY — BRAIN BIOPSY
Anesthesia: General

## 2013-05-24 MED ORDER — PROPOFOL 10 MG/ML IV BOLUS
INTRAVENOUS | Status: DC | PRN
  Administered 2013-05-24: 160 mg via INTRAVENOUS

## 2013-05-24 MED ORDER — ROCURONIUM BROMIDE 50 MG/5ML IV SOLN
INTRAVENOUS | Status: AC
  Filled 2013-05-24: qty 1

## 2013-05-24 MED ORDER — LIDOCAINE HCL (CARDIAC) 20 MG/ML IV SOLN
INTRAVENOUS | Status: DC | PRN
  Administered 2013-05-24: 40 mg via INTRAVENOUS

## 2013-05-24 MED ORDER — MORPHINE SULFATE 2 MG/ML IJ SOLN: 1.0000 mg | INTRAMUSCULAR | Status: DC | PRN

## 2013-05-24 MED ORDER — ONDANSETRON HCL 4 MG/2ML IJ SOLN: 4.0000 mg | INTRAMUSCULAR | Status: DC | PRN

## 2013-05-24 MED ORDER — DEXAMETHASONE SODIUM PHOSPHATE 10 MG/ML IJ SOLN
INTRAMUSCULAR | Status: AC
  Administered 2013-05-24: 10 mg via INTRAVENOUS
  Filled 2013-05-24: qty 1

## 2013-05-24 MED ORDER — LIDOCAINE-EPINEPHRINE 0.5 %-1:200000 IJ SOLN
INTRAMUSCULAR | Status: DC | PRN
  Administered 2013-05-24: 4 mL

## 2013-05-24 MED ORDER — OXYCODONE HCL 5 MG PO TABS: 5.0000 mg | ORAL_TABLET | Freq: Once | ORAL | Status: DC | PRN

## 2013-05-24 MED ORDER — PHENYLEPHRINE HCL 10 MG/ML IJ SOLN
INTRAMUSCULAR | Status: DC | PRN
  Administered 2013-05-24: 40 ug via INTRAVENOUS
  Administered 2013-05-24: 80 ug via INTRAVENOUS

## 2013-05-24 MED ORDER — HYDROMORPHONE HCL PF 1 MG/ML IJ SOLN
0.2500 mg | INTRAMUSCULAR | Status: DC | PRN
Start: 2013-05-24 — End: 2013-05-24

## 2013-05-24 MED ORDER — BACITRACIN ZINC 500 UNIT/GM EX OINT
TOPICAL_OINTMENT | CUTANEOUS | Status: DC | PRN
Start: 2013-05-24 — End: 2013-05-24
  Administered 2013-05-24: 1 via TOPICAL

## 2013-05-24 MED ORDER — CEFAZOLIN SODIUM-DEXTROSE 2-3 GM-% IV SOLR
INTRAVENOUS | Status: AC
  Filled 2013-05-24: qty 50

## 2013-05-24 MED ORDER — LABETALOL HCL 5 MG/ML IV SOLN
INTRAVENOUS | Status: AC
  Filled 2013-05-24: qty 4

## 2013-05-24 MED ORDER — DEXAMETHASONE 4 MG PO TABS: 4.0000 mg | ORAL_TABLET | Freq: Three times a day (TID) | ORAL | Status: DC

## 2013-05-24 MED ORDER — SODIUM CHLORIDE 0.9 % IV SOLN
INTRAVENOUS | Status: DC | PRN
Start: 2013-05-24 — End: 2013-05-24
  Administered 2013-05-24: 14:00:00 via INTRAVENOUS

## 2013-05-24 MED ORDER — ONDANSETRON HCL 4 MG/2ML IJ SOLN: 4.0000 mg | Freq: Once | INTRAMUSCULAR | Status: DC | PRN

## 2013-05-24 MED ORDER — ONDANSETRON HCL 4 MG PO TABS: 4.0000 mg | ORAL_TABLET | ORAL | Status: DC | PRN

## 2013-05-24 MED ORDER — PROPOFOL 10 MG/ML IV BOLUS
INTRAVENOUS | Status: AC
  Filled 2013-05-24: qty 20

## 2013-05-24 MED ORDER — ONDANSETRON HCL 4 MG/2ML IJ SOLN
INTRAMUSCULAR | Status: AC
  Filled 2013-05-24: qty 2

## 2013-05-24 MED ORDER — POLYETHYLENE GLYCOL 3350 17 G PO PACK
17.0000 g | PACK | Freq: Every day | ORAL | Status: DC | PRN
  Filled 2013-05-24: qty 1

## 2013-05-24 MED ORDER — ONDANSETRON HCL 4 MG/2ML IJ SOLN
INTRAMUSCULAR | Status: DC | PRN
  Administered 2013-05-24: 4 mg via INTRAVENOUS

## 2013-05-24 MED ORDER — PANTOPRAZOLE SODIUM 40 MG IV SOLR
40.0000 mg | Freq: Every day | INTRAVENOUS | Status: DC
  Administered 2013-05-24: 40 mg via INTRAVENOUS
  Filled 2013-05-24 (×2): qty 40

## 2013-05-24 MED ORDER — THROMBIN 5000 UNITS EX SOLR
CUTANEOUS | Status: DC | PRN
  Administered 2013-05-24 (×2): 5000 [IU] via TOPICAL

## 2013-05-24 MED ORDER — HYDROCODONE-ACETAMINOPHEN 5-325 MG PO TABS
1.0000 | ORAL_TABLET | ORAL | Status: DC | PRN
  Administered 2013-05-24: 1 via ORAL
  Filled 2013-05-24 (×2): qty 1

## 2013-05-24 MED ORDER — SODIUM CHLORIDE 0.9 % IV SOLN
INTRAVENOUS | Status: DC | PRN
  Administered 2013-05-24: 14:00:00 via INTRAVENOUS

## 2013-05-24 MED ORDER — OXYCODONE HCL 5 MG/5ML PO SOLN: 5.0000 mg | Freq: Once | ORAL | Status: DC | PRN

## 2013-05-24 MED ORDER — SENNA 8.6 MG PO TABS
1.0000 | ORAL_TABLET | Freq: Two times a day (BID) | ORAL | Status: DC
  Administered 2013-05-25: 8.6 mg via ORAL
  Filled 2013-05-24 (×3): qty 1

## 2013-05-24 MED ORDER — LIDOCAINE HCL (CARDIAC) 20 MG/ML IV SOLN
INTRAVENOUS | Status: AC
Start: 2013-05-24 — End: 2013-05-24
  Filled 2013-05-24: qty 5

## 2013-05-24 MED ORDER — MICROFIBRILLAR COLL HEMOSTAT EX PADS
MEDICATED_PAD | CUTANEOUS | Status: DC | PRN
  Administered 2013-05-24: 1 via TOPICAL

## 2013-05-24 MED ORDER — NEOSTIGMINE METHYLSULFATE 1 MG/ML IJ SOLN
INTRAMUSCULAR | Status: DC | PRN
  Administered 2013-05-24: 4 mg via INTRAVENOUS

## 2013-05-24 MED ORDER — PHENYLEPHRINE 40 MCG/ML (10ML) SYRINGE FOR IV PUSH (FOR BLOOD PRESSURE SUPPORT)
PREFILLED_SYRINGE | INTRAVENOUS | Status: AC
  Filled 2013-05-24: qty 10

## 2013-05-24 MED ORDER — 0.9 % SODIUM CHLORIDE (POUR BTL) OPTIME
TOPICAL | Status: DC | PRN
  Administered 2013-05-24: 1000 mL

## 2013-05-24 MED ORDER — MAGNESIUM CITRATE PO SOLN
1.0000 | Freq: Once | ORAL | Status: AC | PRN
  Filled 2013-05-24: qty 296

## 2013-05-24 MED ORDER — DEXAMETHASONE 4 MG PO TABS
4.0000 mg | ORAL_TABLET | Freq: Four times a day (QID) | ORAL | Status: DC
  Filled 2013-05-24 (×3): qty 1

## 2013-05-24 MED ORDER — BISACODYL 5 MG PO TBEC
5.0000 mg | DELAYED_RELEASE_TABLET | Freq: Every day | ORAL | Status: DC | PRN
  Filled 2013-05-24: qty 1

## 2013-05-24 MED ORDER — POTASSIUM CHLORIDE IN NACL 20-0.9 MEQ/L-% IV SOLN
INTRAVENOUS | Status: DC
  Administered 2013-05-24: 19:00:00 via INTRAVENOUS
  Filled 2013-05-24 (×3): qty 1000

## 2013-05-24 MED ORDER — ROCURONIUM BROMIDE 100 MG/10ML IV SOLN
INTRAVENOUS | Status: DC | PRN
  Administered 2013-05-24: 10 mg via INTRAVENOUS
  Administered 2013-05-24: 40 mg via INTRAVENOUS
  Administered 2013-05-24: 10 mg via INTRAVENOUS
  Administered 2013-05-24: 5 mg via INTRAVENOUS

## 2013-05-24 MED ORDER — GLYCOPYRROLATE 0.2 MG/ML IJ SOLN
INTRAMUSCULAR | Status: AC
  Filled 2013-05-24: qty 3

## 2013-05-24 MED ORDER — PROMETHAZINE HCL 25 MG PO TABS: 12.5000 mg | ORAL_TABLET | ORAL | Status: DC | PRN

## 2013-05-24 MED ORDER — HYDROMORPHONE HCL PF 1 MG/ML IJ SOLN
INTRAMUSCULAR | Status: AC
  Administered 2013-05-24: 0.25 mg
  Filled 2013-05-24: qty 1

## 2013-05-24 MED ORDER — FENTANYL CITRATE 0.05 MG/ML IJ SOLN
INTRAMUSCULAR | Status: AC
  Filled 2013-05-24: qty 5

## 2013-05-24 MED ORDER — NALOXONE HCL 0.4 MG/ML IJ SOLN: 0.0800 mg | INTRAMUSCULAR | Status: DC | PRN

## 2013-05-24 MED ORDER — GLYCOPYRROLATE 0.2 MG/ML IJ SOLN
INTRAMUSCULAR | Status: DC | PRN
  Administered 2013-05-24: 0.6 mg via INTRAVENOUS

## 2013-05-24 MED ORDER — LABETALOL HCL 5 MG/ML IV SOLN
10.0000 mg | INTRAVENOUS | Status: DC | PRN
  Administered 2013-05-24: 20 mg via INTRAVENOUS

## 2013-05-24 MED ORDER — HEMOSTATIC AGENTS (NO CHARGE) OPTIME
TOPICAL | Status: DC | PRN
  Administered 2013-05-24: 1 via TOPICAL

## 2013-05-24 MED ORDER — DEXAMETHASONE 6 MG PO TABS
6.0000 mg | ORAL_TABLET | Freq: Four times a day (QID) | ORAL | Status: AC
  Administered 2013-05-24 – 2013-05-25 (×4): 6 mg via ORAL
  Filled 2013-05-24 (×4): qty 1

## 2013-05-24 MED ORDER — FENTANYL CITRATE 0.05 MG/ML IJ SOLN
INTRAMUSCULAR | Status: DC | PRN
  Administered 2013-05-24 (×2): 50 ug via INTRAVENOUS
  Administered 2013-05-24: 100 ug via INTRAVENOUS

## 2013-05-24 SURGICAL SUPPLY — 48 items
BLADE SURG ROTATE 9660 (MISCELLANEOUS) IMPLANT
BUR ACORN 6.0 PRECISION (BURR) ×1 IMPLANT
BUR ACORN 6.0MM PRECISION (BURR) ×1
BUR ROUTER D-58 CRANI (BURR) ×2 IMPLANT
CANISTER SUCT 3000ML (MISCELLANEOUS) ×3 IMPLANT
CONT SPEC 4OZ CLIKSEAL STRL BL (MISCELLANEOUS) ×6 IMPLANT
CORDS BIPOLAR (ELECTRODE) ×3 IMPLANT
DRAPE POUCH INSTRU U-SHP 10X18 (DRAPES) ×3 IMPLANT
DRAPE PROXIMA HALF (DRAPES) ×2 IMPLANT
DRAPE SURG IRRIG POUCH 19X23 (DRAPES) ×2 IMPLANT
DRESSING TELFA 8X3 (GAUZE/BANDAGES/DRESSINGS) ×3 IMPLANT
DRSG OPSITE 4X5.5 SM (GAUZE/BANDAGES/DRESSINGS) ×2 IMPLANT
DURAFORM COLLAGEN 1X1 5-PACK (Neuro Prosthesis/Implant) ×2 IMPLANT
DURAPREP 26ML APPLICATOR (WOUND CARE) IMPLANT
ELECT REM PT RETURN 9FT ADLT (ELECTROSURGICAL) ×3
ELECTRODE REM PT RTRN 9FT ADLT (ELECTROSURGICAL) ×1 IMPLANT
GAUZE SPONGE 4X4 16PLY XRAY LF (GAUZE/BANDAGES/DRESSINGS) ×3 IMPLANT
GLOVE ECLIPSE 6.5 STRL STRAW (GLOVE) ×3 IMPLANT
GLOVE ECLIPSE 7.5 STRL STRAW (GLOVE) ×2 IMPLANT
GLOVE EXAM NITRILE LRG STRL (GLOVE) IMPLANT
GLOVE EXAM NITRILE MD LF STRL (GLOVE) IMPLANT
GLOVE EXAM NITRILE XL STR (GLOVE) IMPLANT
GLOVE EXAM NITRILE XS STR PU (GLOVE) IMPLANT
GOWN BRE IMP SLV AUR LG STRL (GOWN DISPOSABLE) ×3 IMPLANT
GOWN BRE IMP SLV AUR XL STRL (GOWN DISPOSABLE) IMPLANT
GOWN STRL REIN 2XL LVL4 (GOWN DISPOSABLE) IMPLANT
KIT BASIN OR (CUSTOM PROCEDURE TRAY) ×3 IMPLANT
KIT ROOM TURNOVER OR (KITS) ×3 IMPLANT
NEEDLE HYPO 25X1 1.5 SAFETY (NEEDLE) ×3 IMPLANT
NS IRRIG 1000ML POUR BTL (IV SOLUTION) ×3 IMPLANT
PACK CRANIOTOMY (CUSTOM PROCEDURE TRAY) ×3 IMPLANT
PAD ARMBOARD 7.5X6 YLW CONV (MISCELLANEOUS) ×9 IMPLANT
PIN MAYFIELD SKULL DISP (PIN) ×4 IMPLANT
PLATE 1.5  2HOLE MED NEURO (Plate) ×6 IMPLANT
PLATE 1.5 2HOLE MED NEURO (Plate) IMPLANT
SCREW SELF DRILL HT 1.5/4MM (Screw) ×12 IMPLANT
SPONGE GAUZE 4X4 12PLY (GAUZE/BANDAGES/DRESSINGS) ×3 IMPLANT
SPONGE SURGIFOAM ABS GEL SZ50 (HEMOSTASIS) ×3 IMPLANT
STAPLER SKIN PROX WIDE 3.9 (STAPLE) ×3 IMPLANT
SUT BONE WAX W31G (SUTURE) ×3 IMPLANT
SUT VIC AB 2-0 CP2 18 (SUTURE) ×3 IMPLANT
SUT VIC AB 2-0 CT2 18 VCP726D (SUTURE) ×2 IMPLANT
SYR 5ML LUER SLIP (SYRINGE) ×6 IMPLANT
SYR CONTROL 10ML LL (SYRINGE) ×3 IMPLANT
TOWEL OR 17X24 6PK STRL BLUE (TOWEL DISPOSABLE) ×3 IMPLANT
TOWEL OR 17X26 10 PK STRL BLUE (TOWEL DISPOSABLE) ×3 IMPLANT
TRAY FOLEY CATH 16FRSI W/METER (SET/KITS/TRAYS/PACK) ×3 IMPLANT
WATER STERILE IRR 1000ML POUR (IV SOLUTION) ×3 IMPLANT

## 2013-05-24 NOTE — Anesthesia Postprocedure Evaluation (Signed)
  Anesthesia Post-op Note  Patient: Jonathan Barker  Procedure(s) Performed: Procedure(s) with comments: Craniotomy for open biopsy (N/A) - Craniotomy for open biopsy  Patient Location: ICU  Anesthesia Type:General  Level of Consciousness: awake  Airway and Oxygen Therapy: Patient Spontanous Breathing and Patient connected to nasal cannula oxygen  Post-op Pain: mild  Post-op Assessment: Post-op Vital signs reviewed  Post-op Vital Signs: Reviewed  Complications: No apparent anesthesia complications

## 2013-05-24 NOTE — Anesthesia Preprocedure Evaluation (Addendum)
Anesthesia Evaluation  Patient identified by MRN, date of birth, ID band Patient awake    Reviewed: Allergy & Precautions, H&P , NPO status , Patient's Chart, lab work & pertinent test results  Airway Mallampati: II TM Distance: >3 FB Neck ROM: Full    Dental  (+) Dental Advisory Given, Poor Dentition and Loose,    Pulmonary shortness of breath, Current Smoker,    + decreased breath sounds      Cardiovascular Rhythm:Regular Rate:Normal     Neuro/Psych Seizures -,  Right front brain mass    GI/Hepatic GERD-  ,(+) Hepatitis -  Endo/Other  diabetes, Type 2, Oral Hypoglycemic Agents  Renal/GU      Musculoskeletal   Abdominal   Peds  Hematology   Anesthesia Other Findings   Reproductive/Obstetrics                         Anesthesia Physical Anesthesia Plan  ASA: III  Anesthesia Plan: General   Post-op Pain Management:    Induction: Intravenous  Airway Management Planned: Oral ETT  Additional Equipment: Arterial line  Intra-op Plan:   Post-operative Plan: Extubation in OR  Informed Consent: I have reviewed the patients History and Physical, chart, labs and discussed the procedure including the risks, benefits and alternatives for the proposed anesthesia with the patient or authorized representative who has indicated his/her understanding and acceptance.   Dental advisory given  Plan Discussed with: Anesthesiologist and Surgeon  Anesthesia Plan Comments:         Anesthesia Quick Evaluation

## 2013-05-24 NOTE — Progress Notes (Signed)
TC MD reporting pt continually leaves the floor and is not available to even perform assessment reported to MD this nurse is unable to provide care for this pt as he is non-compliant,informed they are aware and to do the best possible.

## 2013-05-24 NOTE — Preoperative (Signed)
Beta Blockers   Reason not to administer Beta Blockers:Not Applicable 

## 2013-05-24 NOTE — Progress Notes (Signed)
1300 tc from OR ready to take pt down will send someone up ,informed pt is off the floor. 1330 pt on floor tc to OR,instructed pt to stay in room as they will be coming to take him to OR

## 2013-05-24 NOTE — Transfer of Care (Signed)
Immediate Anesthesia Transfer of Care Note  Patient: Jonathan Barker  Procedure(s) Performed: Procedure(s) with comments: Craniotomy for open biopsy (N/A) - Craniotomy for open biopsy  Patient Location: PACU  Anesthesia Type:General  Level of Consciousness: awake, alert  and oriented  Airway & Oxygen Therapy: Patient Spontanous Breathing and Patient connected to nasal cannula oxygen  Post-op Assessment: Report given to PACU RN, Post -op Vital signs reviewed and stable and Patient moving all extremities X 4  Post vital signs: Reviewed and stable  Complications: No apparent anesthesia complications

## 2013-05-24 NOTE — Progress Notes (Signed)
Subjective: Jonathan Barker has no complaints this morning. He is slightly irritable. He seems to be tolerating dilantin well.  Objective: Vital signs in last 24 hours: Filed Vitals:   05/23/13 0534 05/23/13 1410 05/23/13 2038 05/24/13 0502  BP: 113/73 126/71 123/83 108/73  Pulse: 78 88 92 75  Temp: 98 F (36.7 C) 97.9 F (36.6 C) 97.8 F (36.6 C) 97.8 F (36.6 C)  TempSrc: Oral  Oral Oral  Resp: 18 16 20 18   Height:      Weight:      SpO2: 97% 99% 99% 97%   Weight change:   Intake/Output Summary (Last 24 hours) at 05/24/13 1339 Last data filed at 05/24/13 0900  Gross per 24 hour  Intake    315 ml  Output      0 ml  Net    315 ml    Physical exam: General: sitting on edge of bed and intermittently walking around his room, NAD HEENT: NCAT, vision grossly intact Cardiac: RRR, no rubs, murmurs or gallops Pulm: CTAB Abd: soft, nontender, nondistended, BS present Ext: warm and well perfused, no BLE edema Neuro: alert and oriented X3, cranial nerves II-XII grossly intact, walking and moving all extremities  Lab Results: Basic Metabolic Panel:  Recent Labs Lab 05/18/13 1908 05/19/13 0330 05/21/13 0510  NA 139  --  141  K 4.1  --  4.0  CL 101  --  105  CO2 22  --  24  GLUCOSE 227*  --  206*  BUN 14  --  14  CREATININE 0.77  --  0.80  CALCIUM 10.1  --  8.9  MG  --  1.9  --    Liver Function Tests:  Recent Labs Lab 05/18/13 1908  AST 24  ALT 28  ALKPHOS 89  BILITOT 0.2*  PROT 7.5  ALBUMIN 3.9   CBC:  Recent Labs Lab 05/18/13 1908 05/20/13 0500  WBC 12.3* 10.5  NEUTROABS 7.6  --   HGB 17.2* 16.0  HCT 47.8 46.0  MCV 89.7 89.3  PLT 309 268   CBG:  Recent Labs Lab 05/22/13 2005 05/23/13 0740 05/23/13 1127 05/23/13 2035 05/24/13 0739 05/24/13 1205  GLUCAP 324* 210* 231* 236* 159* 168*   Hemoglobin A1C:  Recent Labs Lab 05/19/13 0330  HGBA1C 11.1*   Coagulation:  Recent Labs Lab 05/19/13 0330  LABPROT 13.4  INR 1.04   Urine  Drug Screen: Drugs of Abuse     Component Value Date/Time   LABOPIA NONE DETECTED 05/18/2013 1953   COCAINSCRNUR NONE DETECTED 05/18/2013 1953   LABBENZ NONE DETECTED 05/18/2013 1953   AMPHETMU NONE DETECTED 05/18/2013 1953   THCU POSITIVE* 05/18/2013 1953   LABBARB NONE DETECTED 05/18/2013 1953    Urinalysis:  Recent Labs Lab 05/18/13 1953  COLORURINE YELLOW  LABSPEC 1.034*  PHURINE 6.5  GLUCOSEU >1000*  HGBUR NEGATIVE  BILIRUBINUR NEGATIVE  KETONESUR NEGATIVE  PROTEINUR NEGATIVE  UROBILINOGEN 1.0  NITRITE NEGATIVE  LEUKOCYTESUR NEGATIVE   Studies/Results: Ct Head W Contrast  05/23/2013   CLINICAL DATA:  Stealth protocol. Recent seizure with right foraminal brain mass on imaging.  EXAM: CT HEAD WITH CONTRAST  TECHNIQUE: Contiguous axial images were obtained from the base of the skull through the vertex with intravenous contrast.  CONTRAST:  169mL OMNIPAQUE IOHEXOL 300 MG/ML  SOLN  COMPARISON:  Head CT and brain MRI 05/18/2013  FINDINGS: Infiltrative right frontal lobe mass is better demonstrated on recent MRI. Again seen are areas of ill defined  hypoattenuation within right frontal white matter which may reflect edema or infiltrative tumor, as well as areas of abnormal soft tissue density within the more superior aspect of the right frontal lobe corresponding to the known lesion. There is no midline shift. Ventricles are unchanged without evidence of hydrocephalus. No definite abnormal enhancement is identified. There is no evidence of intracranial hemorrhage or extra-axial fluid collection. Mastoid air cells are clear. Minimal maxillary sinus mucosal thickening is noted. Orbits are unremarkable.  IMPRESSION: Successful preoperative localization of infiltrative right frontal lobe mass.   Electronically Signed   By: Logan Bores   On: 05/23/2013 09:48   Medications: I have reviewed the patient's current medications. Scheduled Meds: .  ceFAZolin (ANCEF) IV  2 g Intravenous On Call to OR    . insulin aspart  0-15 Units Subcutaneous TID WC  . insulin aspart  0-5 Units Subcutaneous QHS  . insulin glargine  20 Units Subcutaneous QHS  . nicotine  14 mg Transdermal Daily  . 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 Primary low grade astrocytoma: No more seizure activity since admission.  Pt will undergo R frontal craniotomy for biopsy today.  -Seizure precautions -continue dilantin 300mg  qHS -appreciate neurology and neurosurgery following  DM type 2:  A1c 11.1 on admission. CBGs still elevated 231-236 after lantus 20U last night. Will hold at this level of lantus given patient will likely not be going home on lantus and in the setting of his procedure today. -lantus 20U qHS -SSI moderate -continue hs sliding scale -cbg monitoring  Tobacco abuse: admits to smoking 2 packs per day. Says he will try to not smoke in the hospital, refuses patch as it makes him sick. Still walks off the floor at times to smoke.  -counseled extensively on smoking cessation and NOT smoking in the hospital  VTE: SCDs for now. Patient is ambulating.  Diet: carb mod- pt seems to be bringing food from outside the hospital  Code status: full  Dispo: Disposition is deferred at this time, awaiting improvement of current medical problems.    The patient does have a current PCP (Juluis Mire, MD) and does not need an [facility] hospital follow-up appointment after discharge.  The patient does not have transportation limitations that hinder transportation to clinic appointments.  Services Needed at time of discharge: Y = Yes, Blank = No PT:   OT:   RN:   Equipment:   Other:     LOS: 6 days   Rebecca Eaton, MD 05/24/2013, 1:39 PM

## 2013-05-24 NOTE — Progress Notes (Signed)
Inpatient Diabetes Program Recommendations  AACE/ADA: New Consensus Statement on Inpatient Glycemic Control (2013)  Target Ranges:  Prepandial:   less than 140 mg/dL      Peak postprandial:   less than 180 mg/dL (1-2 hours)      Critically ill patients:  140 - 180 mg/dL   No correction insulin given this am Spoke with RN and requested she give pt correction insulin even if NPO. HOWEVER RN informed me that pt keeps leaving the floor to smoke and has not been in the room for her to speak with! Thank you, Rosita Kea, RN, CNS, Diabetes Coordinator [phone])

## 2013-05-24 NOTE — Anesthesia Procedure Notes (Signed)
Procedure Name: Intubation Date/Time: 05/24/2013 2:17 PM Performed by: Carola Frost Pre-anesthesia Checklist: Patient identified, Timeout performed, Emergency Drugs available, Suction available and Patient being monitored Patient Re-evaluated:Patient Re-evaluated prior to inductionOxygen Delivery Method: Circle system utilized Preoxygenation: Pre-oxygenation with 100% oxygen Intubation Type: IV induction Ventilation: Mask ventilation without difficulty and Oral airway inserted - appropriate to patient size Laryngoscope Size: Mac and 4 Grade View: Grade IV Tube size: 8.0 mm Number of attempts: 1 Airway Equipment and Method: Stylet Placement Confirmation: positive ETCO2,  breath sounds checked- equal and bilateral and ETT inserted through vocal cords under direct vision Secured at: 23 cm Tube secured with: Tape Dental Injury: Teeth and Oropharynx as per pre-operative assessment

## 2013-05-24 NOTE — Op Note (Signed)
05/18/2013 - 05/24/2013  7:47 PM  PATIENT:  Jonathan Barker  48 y.o. male taken to the operating room for an open brain biopsy  PRE-OPERATIVE DIAGNOSIS:  brain mass, right frontal  POST-OPERATIVE DIAGNOSIS:  brain mass, right frontal  PROCEDURE:  Procedure(s): right frontal stereotactic Craniotomy for open biopsy  SURGEON:  Surgeon(s): Winfield Cunas, MD  ASSISTANTS:Hirsch, Jeneen Rinks  ANESTHESIA:   general  EBL:     BLOOD ADMINISTERED:none  CELL SAVER GIVEN:none  COUNT:per nursing  DRAINS: none   SPECIMEN:  Source of Specimen:  right frontal lobe  DICTATION: Mr. Rosenow was taken to the operating room, intubated and placed under a general anesthetic without difficulty. Once adequate anesthesia was obtained I placed Mr. Salls was placed in a 3 pin [place] head holder. Using the preoperative plan, I registered Mr. Damore to [employer] system. Once completed, I shaved and prepped his head in a sterile manner. I infiltrated 10 cc lidocaine into the planned scalp incision. I planned the incision using the stealth system. I opened the scalp with a 10 blade. I placed Raney clips on the scalp edges, and used a cerebellar retractor to expose the skull. I drilled a burr hole after again assessing the localization, then turned a very small craniotomy flap.  I opened the dura in a cruciate fashon. Using the stealth I determined the location for my biopsies. I took multiple biopsies, and although the pathologist felt the initial sample was very cellular he would not commit to calling it abnormal. I therefore sent more samples for permanent sectioning. I irrigated the brain, achieved good hemostasis, then closed the wound. I  Placed a piece of duragen to cover the brain, then replaced the bone flap with plates and screws. I approximated the galea, then scalp edges. Dr. Luiz Ochoa assisted with the biopsy and closure. I placed a sterile dressing, then the patient was removed from the [place] and  extubated.  PLAN OF CARE: Admit to inpatient   PATIENT DISPOSITION:  PACU - hemodynamically stable.   Delay start of Pharmacological VTE agent (>24hrs) due to surgical blood loss or risk of bleeding:  yes

## 2013-05-25 ENCOUNTER — Inpatient Hospital Stay (HOSPITAL_COMMUNITY): Payer: No Typology Code available for payment source

## 2013-05-25 ENCOUNTER — Encounter (HOSPITAL_COMMUNITY): Payer: Self-pay | Admitting: Neurosurgery

## 2013-05-25 ENCOUNTER — Encounter: Payer: Self-pay | Admitting: Internal Medicine

## 2013-05-25 LAB — GLUCOSE, CAPILLARY
GLUCOSE-CAPILLARY: 298 mg/dL — AB (ref 70–99)
GLUCOSE-CAPILLARY: 246 mg/dL — AB (ref 70–99)
Glucose-Capillary: 177 mg/dL — ABNORMAL HIGH (ref 70–99)
Glucose-Capillary: 222 mg/dL — ABNORMAL HIGH (ref 70–99)

## 2013-05-25 MED ORDER — PHENYTOIN SODIUM EXTENDED 100 MG PO CAPS: 100.0000 mg | ORAL_CAPSULE | Freq: Three times a day (TID) | ORAL | Status: DC

## 2013-05-25 MED ORDER — DEXAMETHASONE 4 MG PO TABS: ORAL_TABLET | ORAL | Status: DC

## 2013-05-25 MED ORDER — ACETAMINOPHEN-CODEINE #3 300-30 MG PO TABS: 1.0000 | ORAL_TABLET | Freq: Four times a day (QID) | ORAL | Status: DC | PRN

## 2013-05-25 MED ORDER — HYDROCODONE-ACETAMINOPHEN 5-325 MG PO TABS: 1.0000 | ORAL_TABLET | ORAL | Status: DC | PRN

## 2013-05-25 NOTE — Progress Notes (Signed)
Subjective: Jonathan Barker has no complaints this morning. His pain is well controlled. No seizure-like activity overnight. He is anxious to go home.  Objective: Vital signs in last 24 hours: Filed Vitals:   05/25/13 0600 05/25/13 0700 05/25/13 0800 05/25/13 0900  BP: 128/85 114/74 126/82 128/81  Pulse: 100 96 88 96  Temp:      TempSrc:      Resp: 11 16 16 16   Height:      Weight:      SpO2: 95% 91% 95% 94%   Weight change:   Intake/Output Summary (Last 24 hours) at 05/25/13 1103 Last data filed at 05/25/13 0800  Gross per 24 hour  Intake 1818.67 ml  Output   1900 ml  Net -81.33 ml    Physical exam: General: sitting upright in bedside chair, NAD HEENT: Hair is shaved to R superior scalp w/ dressing in place, vision grossly intact Cardiac: RRR, no rubs, murmurs or gallops Pulm: CTAB Abd: soft, nontender, nondistended, BS present Ext: warm and well perfused, no BLE edema Neuro: alert and oriented X3, cranial nerves II-XII grossly intact, walking and moving all extremities  Lab Results: Basic Metabolic Panel:  Recent Labs Lab 05/18/13 1908 05/19/13 0330 05/21/13 0510  NA 139  --  141  K 4.1  --  4.0  CL 101  --  105  CO2 22  --  24  GLUCOSE 227*  --  206*  BUN 14  --  14  CREATININE 0.77  --  0.80  CALCIUM 10.1  --  8.9  MG  --  1.9  --    Liver Function Tests:  Recent Labs Lab 05/18/13 1908  AST 24  ALT 28  ALKPHOS 89  BILITOT 0.2*  PROT 7.5  ALBUMIN 3.9   CBC:  Recent Labs Lab 05/18/13 1908 05/20/13 0500  WBC 12.3* 10.5  NEUTROABS 7.6  --   HGB 17.2* 16.0  HCT 47.8 46.0  MCV 89.7 89.3  PLT 309 268   CBG:  Recent Labs Lab 05/23/13 2035 05/24/13 0739 05/24/13 1205 05/24/13 2003 05/25/13 0349 05/25/13 0819  GLUCAP 236* 159* 168* 195* 298* 246*   Hemoglobin A1C:  Recent Labs Lab 05/19/13 0330  HGBA1C 11.1*   Coagulation:  Recent Labs Lab 05/19/13 0330  LABPROT 13.4  INR 1.04   Urine Drug Screen: Drugs of Abuse    Component Value Date/Time   LABOPIA NONE DETECTED 05/18/2013 1953   COCAINSCRNUR NONE DETECTED 05/18/2013 1953   LABBENZ NONE DETECTED 05/18/2013 1953   AMPHETMU NONE DETECTED 05/18/2013 1953   THCU POSITIVE* 05/18/2013 1953   LABBARB NONE DETECTED 05/18/2013 1953    Urinalysis:  Recent Labs Lab 05/18/13 1953  COLORURINE YELLOW  LABSPEC 1.034*  PHURINE 6.5  GLUCOSEU >1000*  HGBUR NEGATIVE  BILIRUBINUR NEGATIVE  KETONESUR NEGATIVE  PROTEINUR NEGATIVE  UROBILINOGEN 1.0  NITRITE NEGATIVE  LEUKOCYTESUR NEGATIVE   Studies/Results: Ct Head Wo Contrast  05/25/2013   CLINICAL DATA:  Follow-up status post brain biopsy.  EXAM: CT HEAD WITHOUT CONTRAST  TECHNIQUE: Contiguous axial images were obtained from the base of the skull through the vertex without intravenous contrast.  COMPARISON:  Prior MRI from 05/18/2013.  FINDINGS: Postoperative changes from interval right frontal craniotomy with biopsy of previously identified infiltrative right frontal lobe mass are seen. Skin staples with edema and soft tissue emphysema overlie the craniotomy site. Scattered pneumocephalus is seen subjacent to the craniotomy site, anterior to the right frontal lobe, as well as at the  biopsy site in the right frontal lobe. Minimal hyperdensity is seen at this site of biopsy (series 2, image 24), likely relates to a small amount of blood products. Vasogenic edema within the subcortical white matter of the anterior right frontal lobe is similar. Additional subtle hypodensity within the subcortical white matter of the left frontal lobe is also similar.  No midline shift or hydrocephalus. No acute large vessel territory infarct. No new mass lesion.  Orbits are intact. Paranasal sinuses and mastoid air cells remain clear.  IMPRESSION: Postoperative changes from interval right frontal craniotomy with biopsy of previously identified infiltrative right frontal lobe mass without complication. Minimal hyperdensity at the biopsy site  likely represents a small amount of blood products.   Electronically Signed   By: Jeannine Boga M.D.   On: 05/25/2013 06:33   Medications: I have reviewed the patient's current medications. Scheduled Meds: . dexamethasone  6 mg Oral Q6H   Followed by  . dexamethasone  4 mg Oral Q6H   Followed by  . [START ON 05/26/2013] dexamethasone  4 mg Oral Q8H  . insulin aspart  0-15 Units Subcutaneous TID WC  . insulin aspart  0-5 Units Subcutaneous QHS  . insulin glargine  20 Units Subcutaneous QHS  . nicotine  14 mg Transdermal Daily  . pantoprazole (PROTONIX) IV  40 mg Intravenous QHS  . phenytoin  300 mg Oral QHS  . senna  1 tablet Oral BID   Continuous Infusions: . 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 Primary low grade astrocytoma: No more seizure activity since admission.  S/p R frontal craniotomy w/ biopsy of mass yesterday. He has done well and his pain is controlled. Will need to await recs from neurosurgery before discharging patient.  -Seizure precautions -decadron taper as directed by neurosurgery -norco 5-325mg  q4h prn; morphine 1-2mg  IV q2h -continue dilantin ER 300mg  qHS -appreciate neurology and neurosurgery following  DM type 2:  A1c 11.1 on admission. CBGs still elevated 168-298 after lantus 20U last night. Will plan to discharge patient on his home regimen. I will emphasize compliance with metformin. -lantus 20U qHS -SSI moderate -continue hs sliding scale -cbg monitoring  Tobacco abuse: admits to smoking 2 packs per day. Continues to smoke during his hospitalization.  VTE: SCDs for now. Patient is ambulating.  Diet: carb mod- pt seems to be bringing food from outside  the hospital  Code status: full  Dispo: Disposition is deferred at this time, awaiting improvement of current medical problems.    The patient does have a current PCP (Juluis Mire, MD) and does not need an [facility] hospital follow-up appointment after discharge.  The patient does not have transportation limitations that hinder transportation to clinic appointments.  Services Needed at time of discharge: Y = Yes, Blank = No PT:   OT:   RN:   Equipment:   Other:     LOS: 7 days   Rebecca Eaton, MD 05/25/2013, 11:03 AM

## 2013-05-25 NOTE — Discharge Instructions (Signed)
Craniotomy °Care After °Please read the instructions outlined below and refer to this sheet in the next few weeks. These discharge instructions provide you with general information on caring for yourself after you leave the hospital. Your surgeon may also give you specific instructions. While your treatment has been planned according to the most current medical practices available, unavoidable complications occasionally occur. If you have any problems or questions after discharge, please call your surgeon. °Although there are many types of brain surgery, recovery following craniotomy (surgical opening of the skull) is much the same for each. However, recovery depends on many factors. These include the type and severity of brain injury and the type of surgery. It also depends on any nervous system function problems (neurological deficits) before surgery. If the craniotomy was done for cancer, chemotherapy and radiation could follow. You could be in the hospital from 5 days to a couple weeks. This depends on the type of surgery, findings, and whether there are complications. °HOME CARE INSTRUCTIONS  °· It is not unusual to hear a clicking noise after a craniotomy, the plates and screws used to attach the bone flap can sometimes cause this. It is a normal occurrence if this does happen °· Do not drive for 10 days after the operation °· Your scalp may feel spongy for a while, because of fluid under it. This will gradually get better. Occasionally, the surgeon will not replace the bone that was removed to access the brain. If there is a bony defect, the surgeon will ask you to wear a helmet for protection. This is a discussion you should have with your surgeon prior to leaving the hospital (discharge). °· Numbness may persist in some areas of your scalp. °· Take all medications as directed. Sometimes steroids to control swelling are prescribed. Anticonvulsants to prevent seizures may also be given. Do not use alcohol,  other drugs, or medications unless your surgeon says it is OK. °· Keep the wound dry and clean. The wound may be washed gently with soap and water. Then, you may gently blot or dab it dry, without rubbing. Do not take baths, use swimming pools or hot tubs for 10 days, or as instructed by your caregiver. It is best to wait to see you surgeon at your first postoperative visit, and to get directions at that time. °· Only take over-the-counter or prescription medicines for pain, discomfort, or fever as directed by your caregiver. °· You may continue your normal diet, as directed. °· Walking is OK for exercise. Wait at least 3 months before you return to mild, non-contact sports or as your surgeon suggests. Contact sports should be avoided for at least 1 year, unless your surgeon says it is OK. °· If you are prescribed steroids, take them exactly as prescribed. If you start having a decrease in nervous system functions (neurological deficits) and headaches as the dose of steroids is reduced, tell your surgeon right away. °· When the anticonvulsant prescription is finished you no longer need to take it. °SEEK IMMEDIATE MEDICAL CARE IF:  °· You develop nausea, vomiting, severe headaches, confusion, or you have a seizure. °· You develop chest pain, a stiff neck, or difficulty breathing. °· There is redness, swelling, or increasing pain in the wound or pin insertion sites. °· You have an increase in swelling or bruising around the eyes. °· There is drainage or pus coming from the wound. °· You have an oral temperature above 102° F (38.9° C), not controlled by medicine. °·   You notice a foul smell coming from the wound or dressing. °· The wound breaks open (edges not staying together) after the stitches have been removed. °· You develop dizziness or fainting while standing. °· You develop a rash. °· You develop any reaction or side effects to the medications given. °Document Released: 07/20/2005 Document Revised: 07/12/2011  Document Reviewed: 04/28/2009 °ExitCare® Patient Information ©2013 [employer]. ° °

## 2013-05-25 NOTE — Discharge Summary (Signed)
Physician Discharge Summary  Patient ID: Jonathan Barker MRN: [id number] DOB/AGE: [birth year] 48 y.o.  Admit date: 05/18/2013 Discharge date: 05/25/2013  Admission Diagnoses:Seizure disorder, diabetes type 2  Discharge Diagnoses:  Principal Problem:   Right frontal brain mass Active Problems:   Diabetes type 2, uncontrolled   HLD (hyperlipidemia)   GERD (gastroesophageal reflux disease)   Tobacco abuse   Seizure   Brain tumor   Discharged Condition: good  Hospital Course: Mr. Roudebush was admitted to the hospital after seizing. Head CT, then subsequent MRI revealed an abnormality in the right frontal lobe. He was given anticonvulsants and has remained seizure free. He was switched from [place] to dilantin during his stay because he stated he would not be able to afford the keppra as an outpatient. He underwent an uncomplicated right frontal biopsy with results pending as of discharge. His wound is clean, dry, and without signs of infection. His exam neurologically is normal.   Consults: neurological surgery, neurology  Significant Diagnostic Studies: MRI brain  Treatments: surgery: Right frontal craniotomy for biopsy  Discharge Exam: Blood pressure 128/81, pulse 96, temperature 98.1 F (36.7 C), temperature source Axillary, resp. rate 16, height 5\' 11"  (1.803 m), weight 83.915 kg (185 lb), SpO2 94.00%. General appearance: alert, cooperative, appears older than stated age and no distress Neurologic: Alert and oriented X 3, normal strength and tone. Normal symmetric reflexes. Normal coordination and gait  Disposition: 01-Home or Self Care   Future Appointments Provider Department Dept Phone   05/31/2013 9:45 AM Clinton Gallant, MD [place] [phone]   05/31/2013 11:30 AM Dudley Major, DO [facility] [phone]       Medication List         acetaminophen-codeine 300-30 MG per tablet  Commonly known as:  TYLENOL #3  Take 1  tablet by mouth every 6 (six) hours as needed for moderate pain.     dexamethasone 4 MG tablet  Commonly known as:  DECADRON  Take one tablet twice per day for 4 days, then one tablet once per day for 4 days.     metFORMIN 1000 MG tablet  Commonly known as:  GLUCOPHAGE  Take 1,000 mg by mouth daily.     phenytoin 100 MG ER capsule  Commonly known as:  DILANTIN  Take 1 capsule (100 mg total) by mouth 3 (three) times daily.           Follow-up Information   Follow up with Clinton Gallant, MD On 05/31/2013. (9:45am)    Specialty:  Internal Medicine   Contact information:   [address] [place] [address] [phone]       Follow up with [place], [place], DO On 05/31/2013. (11am (this is the neurologist))    Specialty:  Neurology   Contact information:   [place] [address] [phone]       Follow up with Jewell Haught L, MD In 1 week. (call to make an appointment for suture removal)    Specialty:  Neurosurgery   Contact information:   [address], [address]                         [place] [address] [phone]       Signed: Earland Reish L 05/25/2013, 11:27 AM

## 2013-05-30 LAB — GLUCOSE, CAPILLARY: Glucose-Capillary: 176 mg/dL — ABNORMAL HIGH (ref 70–99)

## 2013-05-31 ENCOUNTER — Ambulatory Visit: Payer: No Typology Code available for payment source | Admitting: Neurology

## 2013-05-31 ENCOUNTER — Ambulatory Visit (INDEPENDENT_AMBULATORY_CARE_PROVIDER_SITE_OTHER): Payer: No Typology Code available for payment source | Admitting: Internal Medicine

## 2013-05-31 ENCOUNTER — Encounter: Payer: Self-pay | Admitting: Internal Medicine

## 2013-05-31 ENCOUNTER — Encounter: Payer: Self-pay | Admitting: Licensed Clinical Social Worker

## 2013-05-31 VITALS — BP 128/90 | HR 88 | Temp 96.9°F | Ht 72.0 in | Wt 176.2 lb

## 2013-05-31 DIAGNOSIS — C711 Malignant neoplasm of frontal lobe: Secondary | ICD-10-CM | POA: Insufficient documentation

## 2013-05-31 DIAGNOSIS — IMO0001 Reserved for inherently not codable concepts without codable children: Secondary | ICD-10-CM

## 2013-05-31 DIAGNOSIS — IMO0002 Reserved for concepts with insufficient information to code with codable children: Secondary | ICD-10-CM

## 2013-05-31 DIAGNOSIS — R569 Unspecified convulsions: Secondary | ICD-10-CM

## 2013-05-31 DIAGNOSIS — E1165 Type 2 diabetes mellitus with hyperglycemia: Secondary | ICD-10-CM

## 2013-05-31 HISTORY — DX: Malignant neoplasm of frontal lobe: C71.1

## 2013-05-31 NOTE — Assessment & Plan Note (Addendum)
Pt was recently admitted with first-time seizure and MRI showed right frontal lobe mass. 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 These results were shared with the patient.  -referral to oncology -today last day for decadron taper -pt was provided educational materials and support group information -warning symptoms of increasing brain edema were reviewed with the patient that would need immediate evaluation -pt appt today for staple removal at neurosurgery office

## 2013-05-31 NOTE — Progress Notes (Signed)
   Subjective:    Patient ID: Jonathan Barker, male    DOB: 10/18/1965, 48 y.o.   MRN: [id number]  HPI Mr. Dhawan is 48 yo man pmh as listed below presents for hospital follow-up.  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 Pt was followed by neurosurgery and neurology while in hospital. Pt discharged on decadron taper, dilantin for seizure management which he has been compliant with. Since that time pt has not had any witnessed seizures as he is staying with his girlfriend/fiancee at this time since his discharge. He is homeless and has been working odd jobs [employer] and is concerned for his own and his workers safety at this time since his seizures. He is unaware of his biopsy results. He is also very concerned he will not be able to afford his medications. Of note his fiancee states that he has been having some personality changes recently within the last 2 months- including more anger, some forgetfulness, and "just not himself."  In terms of his DM his sugars have been slightly elevated in the setting of the steriods but he continues to take lantus 20U whs and the metformin.   The patient's biopsy results were shared with the patient in regards to low grade astrocytoma findings and implications for needed further evaluation by oncology given he is symptomatic with seizures and personality changes.   Review of Systems  Constitutional: Negative for fever, chills, diaphoresis, appetite change and fatigue.  HENT: Negative for hearing loss, tinnitus, trouble swallowing and voice change.   Eyes: Negative for photophobia and visual disturbance.  Respiratory: Negative for cough and shortness of breath.   Cardiovascular: Negative for chest pain, palpitations and leg swelling.  Gastrointestinal: Negative for nausea, vomiting, abdominal pain and constipation.  Musculoskeletal: Negative for gait problem and neck pain.  Neurological:  Negative for dizziness, seizures, syncope, facial asymmetry, speech difficulty, weakness, light-headedness and headaches.    Past Medical History  Diagnosis Date  . Shortness of breath   . Hepatitis   . DM (diabetes mellitus) 08/09/2011    A1c at diagnosis = 12.4  . GERD (gastroesophageal reflux disease) 08/09/2011  . Seizure 05/19/2013   Social, surgical, family history reviewed with patient and updated in appropriate chart locations.     Objective:   Physical Exam Filed Vitals:   05/31/13 0919  BP: 128/90  Pulse: 88  Temp: 96.9 F (36.1 C)   General: sitting in chair, NAD HEENT: PERRL, EOMI, no scleral icterus, 5 cm transverse incision with staples c/d/i with staples,nttp Cardiac: RRR, no rubs, murmurs or gallops Pulm: clear to auscultation bilaterally, moving normal volumes of air Abd: soft, nontender, nondistended, BS present Ext: warm and well perfused, no pedal edema Neuro: alert and oriented X3, cranial nerves II-XII grossly intact    Assessment & Plan:  Please see problem oriented charting  Pt discussed with Dr. Lynnae January

## 2013-05-31 NOTE — Assessment & Plan Note (Signed)
This most likely 2/2 to new astrocytoma. Being managed by neurology with Dilantin. Pt not having any side effects at this time. No repeat seizure activity noted.  -referral to neurology  -cont current dilantin

## 2013-05-31 NOTE — Assessment & Plan Note (Signed)
Lab Results  Component Value Date   HGBA1C 11.1* 05/19/2013   Pt was recently started on lantus 20 units qhs and continued on metformin. Pt didn't bring in meter today but likely slightly elevated in setting of decadron taper. Will re-evaluate in 2 months.

## 2013-05-31 NOTE — Progress Notes (Signed)
CSW met with Jonathan Barker during scheduled hospital follow up.  Currently, Jonathan Barker is staying with significant other but considers himself homeless.  Pt is uninsured, but has the [employer].  Jonathan Barker was informed today of his diagnosis and need to follow up with [facility].  CSW provided Jonathan Barker with information and services provided by the [facility] in addition to support calendar with names of social workers.  Nurse provided CSW with good contact information for Jonathan Barker as pt will need transportation assistance for his first [facility] appointment.  CSW informed Jonathan Barker that he will meet with a Development worker, community during his appointment to initiate application for medicaid coverage and disability.

## 2013-05-31 NOTE — Progress Notes (Signed)
CBG 296 

## 2013-06-01 ENCOUNTER — Telehealth: Payer: Self-pay | Admitting: Oncology

## 2013-06-01 NOTE — Telephone Encounter (Signed)
C/D 06/01/13 for appt. 06/12/13 °

## 2013-06-01 NOTE — Telephone Encounter (Signed)
PATIENT SCHEDULED TO SEE DR. [facility] 02/10 @ 2:30.  NOT ABLE TO REACH PATIENT BY PHONE WILL MAIL APPOINTMENT CALENDAR.

## 2013-06-04 ENCOUNTER — Telehealth: Payer: Self-pay | Admitting: Licensed Clinical Social Worker

## 2013-06-04 NOTE — Telephone Encounter (Signed)
CSW placed call to contact number pt provided CSW during [facility] visit.  Spoke with Otila Kluver, significant other.  CSW inquired if pt was aware of upcoming appts.  Otila Kluver and daughter, Overton Mam on phone.  CSW provided dates, times of upcoming appt and discussed referral to [facility] for transportation for the 2/10 appt.  Referral for transportation faxed to [facility].  CSW will follow up.

## 2013-06-04 NOTE — Progress Notes (Signed)
Case discussed with Dr. Sadek soon after the resident saw the patient.  We reviewed the resident's history and exam and pertinent patient test results.  I agree with the assessment, diagnosis, and plan of care documented in the resident's note. 

## 2013-06-05 ENCOUNTER — Encounter: Payer: Self-pay | Admitting: Radiation Oncology

## 2013-06-05 NOTE — Progress Notes (Signed)
Location/Histology of Brain Tumor: right frontal lobe astrocytoma  Patient presented with symptoms of:  Brought to ED seizing   Past or anticipated interventions, if any, per neurosurgery: brain biopsy  Past or anticipated interventions, if any, per medical oncology: Consult with Humphrey Rolls scheduled for 06/12/2013  Dose of Decadron, if applicable: discharged with decadron 4 mg; take one tablet twice per day for 4 days then, one tablet once per day for 4 days  Recent neurologic symptoms, if any:   Seizures: upon admission but, has not seized since being started on [place] in hospital then, Dilantin at discharge  Headaches:   Nausea:   Dizziness/ataxia:   Difficulty with hand coordination:   Focal numbness/weakness:   Visual deficits/changes:   Confusion/Memory deficits:   Painful bone metastases at present, if any:   SAFETY ISSUES:  Prior radiation? NO  Pacemaker/ICD? NO  Possible current pregnancy? N/A  Is the patient on methotrexate? N/A  Additional Complaints / other details: 48 year old male. Uncontrolled diabetes. Smoker. Abuses marijuana daily.

## 2013-06-06 ENCOUNTER — Other Ambulatory Visit: Payer: Self-pay | Admitting: *Deleted

## 2013-06-06 ENCOUNTER — Ambulatory Visit
Admission: RE | Admit: 2013-06-06 | Discharge: 2013-06-06 | Disposition: A | Discharge status: Home / Self Care | Payer: No Typology Code available for payment source | Source: Ambulatory Visit | Attending: Radiation Oncology | Admitting: Radiation Oncology

## 2013-06-06 ENCOUNTER — Encounter: Payer: Self-pay | Admitting: Radiation Oncology

## 2013-06-06 VITALS — BP 140/98 | HR 96 | Temp 98.0°F | Resp 16 | Ht 71.0 in | Wt 175.6 lb

## 2013-06-06 DIAGNOSIS — C711 Malignant neoplasm of frontal lobe: Secondary | ICD-10-CM | POA: Insufficient documentation

## 2013-06-06 DIAGNOSIS — J3489 Other specified disorders of nose and nasal sinuses: Secondary | ICD-10-CM | POA: Insufficient documentation

## 2013-06-06 DIAGNOSIS — K219 Gastro-esophageal reflux disease without esophagitis: Secondary | ICD-10-CM | POA: Insufficient documentation

## 2013-06-06 DIAGNOSIS — R569 Unspecified convulsions: Secondary | ICD-10-CM | POA: Insufficient documentation

## 2013-06-06 DIAGNOSIS — Z79899 Other long term (current) drug therapy: Secondary | ICD-10-CM | POA: Insufficient documentation

## 2013-06-06 DIAGNOSIS — G936 Cerebral edema: Secondary | ICD-10-CM | POA: Insufficient documentation

## 2013-06-06 DIAGNOSIS — E119 Type 2 diabetes mellitus without complications: Secondary | ICD-10-CM | POA: Insufficient documentation

## 2013-06-06 DIAGNOSIS — F17200 Nicotine dependence, unspecified, uncomplicated: Secondary | ICD-10-CM | POA: Insufficient documentation

## 2013-06-06 MED ORDER — MAGIC MOUTHWASH W/LIDOCAINE: 5.0000 mL | Freq: Four times a day (QID) | ORAL | Status: DC | PRN

## 2013-06-06 MED ORDER — LORAZEPAM 1 MG PO TABS: 1.0000 mg | ORAL_TABLET | Freq: Three times a day (TID) | ORAL | Status: DC

## 2013-06-06 MED ORDER — OXYCODONE-ACETAMINOPHEN 5-325 MG PO TABS: 1.0000 | ORAL_TABLET | Freq: Four times a day (QID) | ORAL | Status: DC | PRN

## 2013-06-06 NOTE — Progress Notes (Signed)
See progress note under physician encounter. 

## 2013-06-06 NOTE — Progress Notes (Signed)
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 male who has never pursued routine medical care. He developed a tonic clonic seizure without loss of consciousness on 05/18/2013 and was seen in the emergency department that day.head CT without contrast revealed ill-defined low attenuation in the medial right frontal lobe as well as the inferior right frontal lobe. Brain MRI on January 16 further characterize an ill-defined infiltrative mass within the anteromedial right frontal lobe without any associated enhancement, worrisome for primary low grade astrocytoma. There was abnormal flair signal extending across the corpus callosum into the contralateral subcortical left frontal lobe which was suspicious for infiltrative tumor.the patient subsequently proceeded to stereotactic image guided right frontal biopsy with Dr. Christella Noa on January 22.  Pathology demonstrated diffuse grade 2 astrocytoma  He was started and tapered off of dexamethasone. He notes some painful sensation related to his tongue with eating and drinking.   The patient has, been referred today for discussion of potential radiation treatment options.  PREVIOUS RADIATION THERAPY: No  PAST MEDICAL HISTORY:  has a past medical history of Shortness of breath; Hepatitis; DM (diabetes mellitus) (08/09/2011); GERD (gastroesophageal reflux disease) (08/09/2011); Seizure (05/19/2013); and Astrocytoma of frontal lobe (05/31/2013).    PAST SURGICAL HISTORY: Past Surgical History  Procedure Laterality Date  . Appendectomy    . Brain biopsy N/A 05/24/2013    Procedure:  Craniotomy for open biopsy;  Surgeon: Winfield Cunas, MD;  Location: [facility];  Service: Neurosurgery;  Laterality: N/A;  Craniotomy for open biopsy    FAMILY HISTORY: family history includes Breast cancer in his mother; Diabetes in his brother, father, and paternal uncle; Heart disease in his father.  SOCIAL HISTORY:  reports that he has been smoking Cigarettes.  He has been smoking about 0.50 packs per day. He has never used smokeless tobacco. He reports that he drinks alcohol. He reports that he uses illicit drugs (Marijuana).  ALLERGIES: Review of patient's allergies indicates no known allergies.  MEDICATIONS:  Current Outpatient Prescriptions  Medication Sig Dispense Refill  . metFORMIN (GLUCOPHAGE) 1000 MG tablet Take 1,000 mg by mouth daily.      . phenytoin (DILANTIN) 100 MG ER capsule Take 1 capsule (100 mg total) by mouth 3 (three) times daily.  90 capsule  11  . acetaminophen-codeine (TYLENOL #3) 300-30 MG per tablet Take 1 tablet by mouth every 6 (six) hours as needed for moderate pain.  60 tablet  0  . dexamethasone (DECADRON) 4 MG tablet Take one tablet twice per day for 4 days, then one tablet once per day for 4 days.  12 tablet  0   No current facility-administered medications for this encounter.    REVIEW OF SYSTEMS:  A 15 point review of systems is documented in the electronic medical record. This was obtained by the nursing staff. However, I reviewed this with the patient to discuss relevant findings and make appropriate changes.  A comprehensive review of systems was negative.   PHYSICAL EXAM:  height is 5\' 11"  (1.803 m) and weight is 175 lb 9.6 oz (79.652 kg). His oral temperature is 98 F (  36.7 C). His blood pressure is 140/98 and his pulse is 96. His respiration is 16 and oxygen saturation is 100%.   the patient is in no acute chest today. Is alert and oriented. His head is notable for shaving of the frontal scalp with a wall intended craniotomy incision and no  evidence of infection or drainage. ENT exam is notable for glossitis and angular cheleitis. Respiratory effort is unremarkable. Patient is neurologically intact. Mood and affect are normal. Speech is articulate and fluent.  KPS = 90  100 - Normal; no complaints; no evidence of disease. 90   - Able to carry on normal activity; minor signs or symptoms of disease. 80   - Normal activity with effort; some signs or symptoms of disease. 24   - Cares for self; unable to carry on normal activity or to do active work. 60   - Requires occasional assistance, but is able to care for most of his personal needs. 50   - Requires considerable assistance and frequent medical care. 74   - Disabled; requires special care and assistance. 24   - Severely disabled; hospital admission is indicated although death not imminent. 9   - Very sick; hospital admission necessary; active supportive treatment necessary. 10   - Moribund; fatal processes progressing rapidly. 0     - Dead  Karnofsky DA, Abelmann Gordon, Craver LS and Burchenal [facility] [phone]) The use of the nitrogen mustards in the palliative treatment of carcinoma: with particular reference to bronchogenic carcinoma Cancer 1 634-56  LABORATORY DATA:  Lab Results  Component Value Date   WBC 10.5 05/20/2013   HGB 16.0 05/20/2013   HCT 46.0 05/20/2013   MCV 89.3 05/20/2013   PLT 268 05/20/2013   Lab Results  Component Value Date   NA 141 05/21/2013   K 4.0 05/21/2013   CL 105 05/21/2013   CO2 24 05/21/2013   Lab Results  Component Value Date   ALT 28 05/18/2013   AST 24 05/18/2013   ALKPHOS 89 05/18/2013   BILITOT 0.2* 05/18/2013     RADIOGRAPHY: Ct Head Wo Contrast  05/25/2013   CLINICAL DATA:  Follow-up status post brain biopsy.  EXAM: CT HEAD WITHOUT CONTRAST  TECHNIQUE: Contiguous axial images were obtained from the base of the skull through the vertex without intravenous contrast.  COMPARISON:  Prior MRI from 05/18/2013.  FINDINGS: Postoperative changes from  interval right frontal craniotomy with biopsy of previously identified infiltrative right frontal lobe mass are seen. Skin staples with edema and soft tissue emphysema overlie the craniotomy site. Scattered pneumocephalus is seen subjacent to the craniotomy site, anterior to the right frontal lobe, as well as at the biopsy site in the right frontal lobe. Minimal hyperdensity is seen at this site of biopsy (series 2, image 24), likely relates to a small amount of blood products. Vasogenic edema within the subcortical white matter of the anterior right frontal lobe is similar. Additional subtle hypodensity within the subcortical white matter of the left frontal lobe is also similar.  No midline shift or hydrocephalus. No acute large vessel territory infarct. No new mass lesion.  Orbits are intact. Paranasal sinuses and mastoid air cells remain clear.  IMPRESSION: Postoperative changes from interval right frontal craniotomy with biopsy of previously identified infiltrative right frontal lobe mass without complication. Minimal hyperdensity at the biopsy site likely represents a small amount of blood products.   Electronically Signed   By: Jeannine Boga M.D.   On: 05/25/2013 06:33  Ct Head Wo Contrast  05/18/2013   CLINICAL DATA:  Seizure like activity.  EXAM: CT HEAD WITHOUT CONTRAST  TECHNIQUE: Contiguous axial images were obtained from the base of the skull through the vertex without intravenous contrast.  COMPARISON:  No priors.  FINDINGS: Slightly ill-defined area of low attenuation in the medial aspect of the right frontal lobe best demonstrated on image 19 of series 2. In addition, in the inferior aspect of the right frontal lobe (image 11 of series 2) there is a larger area of ill-defined low-attenuation. No definite acute intracranial hemorrhage, significant mass effect, hydrocephalus or abnormal intra or extra-axial fluid collections are noted. Visualized paranasal sinuses and mastoids are well  pneumatized, with exception of some very mild mucosal thickening in the ethmoid sinuses bilaterally and the right maxillary sinus. No acute displaced skull fractures are identified.  IMPRESSION: 1. Areas of ill-defined low attenuation in the medial aspect of the right frontal lobe and inferior aspect of the right frontal lobe. These could represent areas of subacute ischemia, however, the possibility of areas of edema related to underlying mass lesions is not excluded. Further characterization of these findings with followup MRI of the brain with and without IV gadolinium is strongly recommended. These results were called by telephone at the time of interpretation on 05/18/2013 at 7:52 PM to Dr. Pattricia Boss, who verbally acknowledged these results.   Electronically Signed   By: Vinnie Langton M.D.   On: 05/18/2013 19:57   Ct Head W Contrast  05/23/2013   CLINICAL DATA:  Stealth protocol. Recent seizure with right foraminal brain mass on imaging.  EXAM: CT HEAD WITH CONTRAST  TECHNIQUE: Contiguous axial images were obtained from the base of the skull through the vertex with intravenous contrast.  CONTRAST:  171mL OMNIPAQUE IOHEXOL 300 MG/ML  SOLN  COMPARISON:  Head CT and brain MRI 05/18/2013  FINDINGS: Infiltrative right frontal lobe mass is better demonstrated on recent MRI. Again seen are areas of ill defined hypoattenuation within right frontal white matter which may reflect edema or infiltrative tumor, as well as areas of abnormal soft tissue density within the more superior aspect of the right frontal lobe corresponding to the known lesion. There is no midline shift. Ventricles are unchanged without evidence of hydrocephalus. No definite abnormal enhancement is identified. There is no evidence of intracranial hemorrhage or extra-axial fluid collection. Mastoid air cells are clear. Minimal maxillary sinus mucosal thickening is noted. Orbits are unremarkable.  IMPRESSION: Successful preoperative localization  of infiltrative right frontal lobe mass.   Electronically Signed   By: Logan Bores   On: 05/23/2013 09:48   Mr Brain W Wo Contrast  05/19/2013   CLINICAL DATA:  New onset seizure  EXAM: MRI HEAD WITHOUT AND WITH CONTRAST  TECHNIQUE: Multiplanar, multiecho pulse sequences of the brain and surrounding structures were obtained without and with intravenous contrast.  CONTRAST:  40mL MULTIHANCE GADOBENATE DIMEGLUMINE 529 MG/ML IV SOLN  COMPARISON:  Prior CT from 05/18/2013  FINDINGS: An ill-defined infiltrating masslike region is seen within the anteromedial right frontal lobe (series 5, image 20). This region demonstrates fairly homogeneous hyperintense T2/FLAIR signal with. This region demonstrates fairly homogeneous hyperintense T2/FLAIR signal with hypo intense T1 signal intensity without post-contrast enhancement. There is expansion and thickening of the overlying cortex within the anteromedial right frontal lobe. Abnormal FLAIR signal extends across the corpus callosum in involves the contralateral subcortical white matter of the left frontal lobe. Hyperintense FLAIR signal also extends inferiorly into the inferior right  frontal lobe. There is no significant mass effect or midline shift. 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  No other mass lesions identified. Ventricles are normal in size without evidence of hydrocephalus. There is no extra-axial fluid collection.  No diffusion-weighted signal abnormality is identified to suggest acute intracranial infarct. Normal flow voids seen within the intracranial vasculature.  Cervicomedullary junction is normal. The globes demonstrate a normal appearance. Optic nerves are normal. Pituitary gland is within normal limits.  No abnormal enhancement identified within the brain.  Paranasal sinuses and mastoid air cells are clear.  Calvarium demonstrates a normal appearance with normal signal intensity. Visualized upper cervical spine within normal  limits.  IMPRESSION: Ill-defined infiltrative mass within the anteromedial right frontal lobe without associated enhancement, worrisome for primary low grade astrocytoma. Abnormal FLAIR signal extending across the corpus callosum into the contralateral subcortical left frontal lobe is suspicious for infiltrative tumor. No significant mass effect or midline shift.   Electronically Signed   By: Jeannine Boga M.D.   On: 05/19/2013 00:07   Dg Chest Port 1 View  05/18/2013   CLINICAL DATA:  cough  EXAM: PORTABLE CHEST - 1 VIEW  COMPARISON:  DG CHEST 2 VIEW dated 07/18/2011  FINDINGS: Normal mediastinum and cardiac silhouette. Normal pulmonary vasculature. No evidence of effusion, infiltrate, or pneumothorax. No acute bony abnormality.  IMPRESSION: No acute cardiopulmonary process.   Electronically Signed   By: Suzy Bouchard M.D.   On: 05/18/2013 19:52      IMPRESSION:   This patient is a very nice 48 year old gentleman with and 8.7 cm area of bifrontal grade 2 astrocytoma with greater involvement along the right side. He's not eligible for surgical resection. The patient falls into an escalated risk group with age greater than 67, bihemispheric involvement, and overall dimension of greater than 6 cm. A recent RTOG study to be published in the March 1 edition of the international Journal of radiation oncology biology and physics reveals that conventional radiotherapy to 54 gray in 30 fractions along with concurrent Temodar provides much greater overall survival than historical controls with radiation treatment alone.(1)  At this point, I suspect this patient would benefit from conformal radiotherapy to 54 gray with concurrent Temodar.   Involvement of a large region of the frontal lobes brain as the target volume within close proximity to the eyes and optic structures, this medically necessitates the use of intensity modulated radiotherapy to achieve prescription dose levels to the target volume while  maintaining nearby organs at risk below tolerance levels.  PLAN:Today, I talked to the patient and family about the findings and work-up thus far.  We discussed the natural history of grade 2 astrocytoma and general treatment, highlighting the role of radiotherapy in the management.  We discussed the available radiation techniques, and focused on the details of logistics and delivery of conformal fractionated radiotherapy.  We reviewed the anticipated acute and late sequelae associated with radiation in this setting.  The patient was encouraged to ask questions that I answered to the best of my ability.  I filled out a patient counseling form during our discussion including treatment diagrams.  We retained a copy for our records.  The patient would like to proceed with radiation and will be scheduled for CT simulation.  I spent 60 minutes minutes face to face with the patient and more than 50% of that time was spent in counseling and/or coordination of care.  ------------------------------------------------  Sheral Apley. Tammi Klippel, M.D.  reference 1. B.J. Fisher at al., Phase 2 Study of Temozolomide-Based Chemoradiation Therapy for High-Risk Low-Grade Gliomas: Preliminary Results of Radiation Therapy Oncology Group 0424. International Journal of Radiation Oncology biology physics.  Volume 91  Number 3  2015  Pages [phone]

## 2013-06-06 NOTE — Progress Notes (Addendum)
Patient reports occasional dizziness upon standing. Daughter reports that occasionally when she and her father are having a conversation he will "just go to sleep." Daughter reports that she has seen changes in her father's mood. She goes on to explain he angers easily and patient confirms this. Patient reports a decline in his short term memory. Patient reports seeing Dr. Cyndy Freeze on Thursday and having staples removed. Transverse right frontal incision well approximated without redness, drainage, or edema. Also, patient reports that he tapered of Decadron on Friday, January 30th. Red meaty tongue noted. Patient reports that she has a sore throat. Also, he reports that his tongue burn when consuming acidic foods. Patient reports occasional headaches he relates to "arguing with his old lady." Patient denies nausea or vomiting. Denies difficulty with hand coordination. Denies numbness or tingling of extremities. Denies diplopia, blurred vision or diminished vision.   Patient presented to the clinic with significant other of 80 years and daughter, age 81. Patient and significant other exchanged harsh words during nurse evaluation. Patient and wife left. Daughter left to find father. Daughter and patient returned to complete evaluation without significant other. Patient expressed concern about living circumstances, wife's mental state, and wife's pill addiction. Encouraged patient to focus on caring for himself. Contacted Polo Riley, LCSW for assistance. PATIENT DISTRESS worksheet was not completed.

## 2013-06-07 MED ORDER — METFORMIN HCL 1000 MG PO TABS: 1000.0000 mg | ORAL_TABLET | Freq: Every day | ORAL | Status: DC

## 2013-06-07 NOTE — Telephone Encounter (Signed)
CSW placed call to [name], spoke with daughter, Overton Mam.  Pt currently with significant other.  CSW inquired if pt wanted CSW to set up transportation.  Daughter states most likely when radiation is scheduled, pt will need transportation.  CSW requested pt to contact CSW when he would like transportation set up.  Pt is able to obtain free transportation utilizing [employer] card/[employer].  Daughter states she will relay information.

## 2013-06-08 ENCOUNTER — Telehealth: Payer: Self-pay | Admitting: Radiation Oncology

## 2013-06-08 ENCOUNTER — Ambulatory Visit
Admission: RE | Admit: 2013-06-08 | Discharge: 2013-06-08 | Disposition: A | Discharge status: Home / Self Care | Payer: No Typology Code available for payment source | Source: Ambulatory Visit | Attending: Radiation Oncology | Admitting: Radiation Oncology

## 2013-06-08 ENCOUNTER — Encounter: Payer: Self-pay | Admitting: Radiation Oncology

## 2013-06-08 ENCOUNTER — Encounter: Payer: Self-pay | Admitting: *Deleted

## 2013-06-08 DIAGNOSIS — C711 Malignant neoplasm of frontal lobe: Secondary | ICD-10-CM | POA: Insufficient documentation

## 2013-06-08 DIAGNOSIS — Z510 Encounter for antineoplastic radiation therapy: Secondary | ICD-10-CM | POA: Insufficient documentation

## 2013-06-08 DIAGNOSIS — Z79899 Other long term (current) drug therapy: Secondary | ICD-10-CM | POA: Insufficient documentation

## 2013-06-08 DIAGNOSIS — R51 Headache: Secondary | ICD-10-CM | POA: Insufficient documentation

## 2013-06-08 DIAGNOSIS — Y842 Radiological procedure and radiotherapy as the cause of abnormal reaction of the patient, or of later complication, without mention of misadventure at the time of the procedure: Secondary | ICD-10-CM | POA: Insufficient documentation

## 2013-06-08 DIAGNOSIS — L988 Other specified disorders of the skin and subcutaneous tissue: Secondary | ICD-10-CM | POA: Insufficient documentation

## 2013-06-08 NOTE — Telephone Encounter (Signed)
Received fax inquiring about ratios of magic mouthwash with lidocaine prescribed 06/06/2013. Spoke with Elta Guadeloupe, pharmacist. Explained combo is 80 cc each of maalox, diphenhydramine, nystatin plus 240 cc of 2% viscous lidocaine.

## 2013-06-08 NOTE — Progress Notes (Signed)
[facility] Psychosocial Distress Screening Clinical Social Work  Clinical Social Work was referred by RN due to stressful, escalated family situation at appointment on 06/06/13. CSW met with Pt to further assess needs and get Pt to complete distress screening protocol.  The patient scored a 5 on the Psychosocial Distress Thermometer which indicates moderate distress. Clinical Social Worker met with Pt at length after SIM today. He was brought to the [facility] by his girlfriend, Otila Kluver and daughter, Overton Mam. They left the room while CSW met with Pt. Pt shared many stressors due to his new diagnosis. 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 He shared a worker from disability came to his home to assist with that application, but he was having issues with medicaid application. CSW educated Pt on financial advocates and how they can assist with these processes as well.   Pt shared his girlfriend and him have been together for 27 years, but have been having "lots of issues lately". They have a 38yo daughter and 80yo son, Kirk Ruths. CSW discussed coping strategies/resources for the family as a whole and provided him with some literature, "When My Parent Has Cancer" and "After my Diagnosis". He planned to read those as well. CSW also discussed Support Team resources and gave handout, business cards. Pt shared feelings of hopelessness and is feeling anxious and depressed. He states he did share that information with his doctor today. He is also interested in counseling with the interns here at [facility]. CSW will make a referral to them. Currently, Pt denied transportation concerns. He reports to live out in [place], so resources are limited. They have a car and would probably qualify for gas assistance. CSW showed Pt where Estate manager/land agent offices were located.  Pt very appreciative and CSW will see Pt on 2/16 and follow up once he begins radiation.    Clinical Social Worker follow  up needed: yes  If yes, follow up plan: Referral to counseling CSW will see Pt on 2/16 and follow up once he begins radiation.    Loren Racer, LCSW Clinical Social Worker [facility] Wednesday, Thursday and Friday Phone: [phone] Fax: [phone]

## 2013-06-08 NOTE — Progress Notes (Signed)
  Radiation Oncology         [phone]) [phone] ________________________________  Name: Jonathan Barker MRN: [id number]  Date: 06/08/2013  DOB: 1965-12-21  SIMULATION AND TREATMENT PLANNING NOTE  DIAGNOSIS:  48 yo gentleman with a bifrontal (right greater than left) 8.7 cm grade II astrocytoma  NARRATIVE:  The patient was brought to the [facility].  Identity was confirmed.  All relevant records and images related to the planned course of therapy were reviewed.  The patient freely provided informed written consent to proceed with treatment after reviewing the details related to the planned course of therapy. The consent form was witnessed and verified by the simulation staff.  Then, the patient was set-up in a stable reproducible  supine position for radiation therapy.  CT images were obtained.  Surface markings were placed.  The CT images were loaded into the planning software.  Then the target and avoidance structures were contoured.  Treatment planning then occurred.  The radiation prescription was entered and confirmed.  Then, I designed and supervised the construction of a total of one medically necessary complex treatment device in the form of a customized thermoplastic mask.  I have requested : Intensity Modulated Radiotherapy (IMRT) is medically necessary for this case for the following reason:  Critical CNS structure avoidance - brainstem, optic chiasm, optic nerve..  I will monitor CBC's ordered by med-onc.  SPECIAL TREATMENT PROCEDURE:  The planned course of therapy using radiation constitutes a special treatment procedure. Special care is required in the management of this patient for the following reasons.  Concurrent chemotherapy with Temodar will require careful monitoring for increased toxicities of treatment including weekly laboratory values. The special nature of the planned course of radiotherapy will require increased physician supervision and oversight to ensure  patient's safety with optimal treatment outcomes.  PLAN:  The patient will receive 54 Gy in 30 fractions.  ________________________________  Sheral Apley Tammi Klippel, M.D.

## 2013-06-11 ENCOUNTER — Other Ambulatory Visit: Payer: Self-pay | Admitting: *Deleted

## 2013-06-11 DIAGNOSIS — C711 Malignant neoplasm of frontal lobe: Secondary | ICD-10-CM

## 2013-06-11 NOTE — Telephone Encounter (Signed)
Pt is linked with [facility] CSW and services.  This worker will sign off at this time.

## 2013-06-12 ENCOUNTER — Telehealth: Payer: Self-pay | Admitting: *Deleted

## 2013-06-12 ENCOUNTER — Encounter: Payer: Self-pay | Admitting: Oncology

## 2013-06-12 ENCOUNTER — Ambulatory Visit: Payer: No Typology Code available for payment source

## 2013-06-12 ENCOUNTER — Other Ambulatory Visit (HOSPITAL_BASED_OUTPATIENT_CLINIC_OR_DEPARTMENT_OTHER): Payer: No Typology Code available for payment source

## 2013-06-12 ENCOUNTER — Ambulatory Visit (HOSPITAL_BASED_OUTPATIENT_CLINIC_OR_DEPARTMENT_OTHER): Payer: No Typology Code available for payment source | Admitting: Oncology

## 2013-06-12 VITALS — BP 127/88 | HR 97 | Temp 97.5°F | Resp 18 | Ht 71.0 in | Wt 178.1 lb

## 2013-06-12 DIAGNOSIS — C719 Malignant neoplasm of brain, unspecified: Secondary | ICD-10-CM

## 2013-06-12 DIAGNOSIS — C711 Malignant neoplasm of frontal lobe: Secondary | ICD-10-CM

## 2013-06-12 LAB — COMPREHENSIVE METABOLIC PANEL (CC13)
ALBUMIN: 3.9 g/dL (ref 3.5–5.0)
ALT: 30 U/L (ref 0–55)
ANION GAP: 12 meq/L — AB (ref 3–11)
AST: 22 U/L (ref 5–34)
Alkaline Phosphatase: 122 U/L (ref 40–150)
BUN: 11.9 mg/dL (ref 7.0–26.0)
CALCIUM: 9.4 mg/dL (ref 8.4–10.4)
CHLORIDE: 101 meq/L (ref 98–109)
CO2: 24 meq/L (ref 22–29)
Creatinine: 0.9 mg/dL (ref 0.7–1.3)
GLUCOSE: 276 mg/dL — AB (ref 70–140)
POTASSIUM: 3.7 meq/L (ref 3.5–5.1)
Sodium: 136 mEq/L (ref 136–145)
Total Bilirubin: 0.35 mg/dL (ref 0.20–1.20)
Total Protein: 7.5 g/dL (ref 6.4–8.3)

## 2013-06-12 LAB — CBC WITH DIFFERENTIAL/PLATELET
BASO%: 1.2 % (ref 0.0–2.0)
Basophils Absolute: 0.1 10*3/uL (ref 0.0–0.1)
EOS ABS: 0.2 10*3/uL (ref 0.0–0.5)
EOS%: 2.3 % (ref 0.0–7.0)
HCT: 49.5 % (ref 38.4–49.9)
HGB: 17.1 g/dL (ref 13.0–17.1)
LYMPH#: 2.8 10*3/uL (ref 0.9–3.3)
LYMPH%: 27.9 % (ref 14.0–49.0)
MCH: 30.8 pg (ref 27.2–33.4)
MCHC: 34.6 g/dL (ref 32.0–36.0)
MCV: 89.1 fL (ref 79.3–98.0)
MONO#: 1 10*3/uL — AB (ref 0.1–0.9)
MONO%: 9.8 % (ref 0.0–14.0)
NEUT%: 58.8 % (ref 39.0–75.0)
NEUTROS ABS: 5.8 10*3/uL (ref 1.5–6.5)
PLATELETS: 245 10*3/uL (ref 140–400)
RBC: 5.56 10*6/uL (ref 4.20–5.82)
RDW: 12.5 % (ref 11.0–14.6)
WBC: 9.9 10*3/uL (ref 4.0–10.3)

## 2013-06-12 MED ORDER — PROCHLORPERAZINE MALEATE 10 MG PO TABS: ORAL_TABLET | ORAL | Status: DC

## 2013-06-12 MED ORDER — TEMOZOLOMIDE 140 MG PO CAPS: ORAL_CAPSULE | ORAL | Status: DC

## 2013-06-12 MED ORDER — SULFAMETHOXAZOLE-TRIMETHOPRIM 800-160 MG PO TABS: ORAL_TABLET | ORAL | Status: DC

## 2013-06-12 NOTE — Progress Notes (Signed)
Checked in new pt who is currently not working.  He has applied for Medicaid and is awaiting approval.  Pt qualified for the one time grant with [employer].

## 2013-06-12 NOTE — Telephone Encounter (Signed)
appts made and printed...td 

## 2013-06-12 NOTE — Patient Instructions (Signed)
Temozolomide capsules What is this medicine? TEMOZOLOMIDE (te moe ZOE loe mide) is a chemotherapy drug. It is used to treat some kinds of brain cancer. This medicine may be used for other purposes; ask your health care provider or pharmacist if you have questions. COMMON BRAND NAME(S): TEMODAR What should I tell my health care provider before I take this medicine? They need to know if you have any of these conditions: -bleeding problems -infection (especially a virus infection such as chickenpox, cold sores, or herpes) -kidney disease -liver disease -recent or ongoing radiation therapy -an unusual or allergic reaction to temozolomide, dacarbazine (DTIC), other medicines, foods, dyes, or preservatives -pregnant or trying to get pregnant -breast-feeding How should I use this medicine? Take this medicine by mouth with a full glass of water. Do not chew, crush or open the capsules. You can take this medicine with or without food. However, you should always take it the same way each time. However, taking this medicine on an empty stomach may reduce nausea. Taking this medicine at bedtime may also reduce nausea. Follow the directions on the prescription label. Take your medicine at regular intervals. Do not take your medicine more often than directed. Do not stop taking except on your doctor's advice. Talk to your pediatrician regarding the use of this medicine in children. Special care may be needed. Overdosage: If you think you have taken too much of this medicine contact a poison control center or emergency room at once. NOTE: This medicine is only for you. Do not share this medicine with others. What if I miss a dose? If you miss a dose, take it as soon as you can. If it is almost time for your next dose, take only that dose. Do not take double or extra doses. What may interact with this medicine? -vaccines -valproic acid This list may not describe all possible interactions. Give your health  care provider a list of all the medicines, herbs, non-prescription drugs, or dietary supplements you use. Also tell them if you smoke, drink alcohol, or use illegal drugs. Some items may interact with your medicine. What should I watch for while using this medicine? This drug may make you feel generally unwell. This is not uncommon, as chemotherapy can affect healthy cells as well as cancer cells. Report any side effects. Continue your course of treatment even though you feel ill unless your doctor tells you to stop. Call your doctor or health care professional for advice if you get a fever, chills or sore throat, or other symptoms of a cold or flu. Call if you get diarrhea. Do not treat yourself. This drug decreases your body's ability to fight infections. Try to avoid being around people who are sick. This medicine may increase your risk to bruise or bleed. Call your doctor or health care professional if you notice any unusual bleeding. Be careful brushing and flossing your teeth or using a toothpick because you may get an infection or bleed more easily. If you have any dental work done, tell your dentist you are receiving this medicine. Avoid taking products that contain aspirin, acetaminophen, ibuprofen, naproxen, or ketoprofen unless instructed by your doctor. These medicines may hide a fever. Do not open the capsules of this medicine. Breathing in or touching the powder in the capsule may be harmful. If the powder accidentally gets on your skin, wash the area thoroughly. If you have difficulty swallowing, contact your doctor or health care professional. This drug may cause birth defects. Both men   men and women who are taking this medicine should use effective birth control. Do not become pregnant while taking this medicine. Women should inform their doctor if they wish to become pregnant or think they might be pregnant. There is a potential for serious side effects to an unborn child. Talk to your  health care professional or pharmacist for more information. Do not breast-feed an infant while taking this medicine. What side effects may I notice from receiving this medicine? Side effects that you should report to your doctor or health care professional as soon as possible: -allergic reactions like skin rash, itching or hives, swelling of the face, lips, or tongue -low blood counts - temozolomide may decrease the number of white blood cells, red blood cells and platelets. You may be at increased risk for infections and bleeding. -signs of infection - fever or chills, cough, sore throat, pain or difficulty passing urine -signs of decreased platelets or bleeding - bruising, pinpoint red spots on the skin, black, tarry stools, blood in the urine -signs of decreased red blood cells - unusually weak or tired, fainting spells, lightheadedness -breathing problems -changes in vision -feeling faint or lightheaded, falls -memory problems -problems with balance, walking -seizures -trouble passing urine or change in the amount of urine -vomiting Side effects that usually do not require medical attention (report to your doctor or health care professional if they continue or are bothersome): -bad taste in the mouth -constipation -difficulty sleeping -dry skin -hair loss -headache -loss of appetite -nausea This list may not describe all possible side effects. Call your doctor for medical advice about side effects. You may report side effects to FDA at 1-800-FDA-1088. Where should I keep my medicine? Keep out of the reach of children. Store at room temperature not above 25 degrees C (77 degrees F). Protect from moisture and heat. Throw away any unused medicine after the expiration date. NOTE: This sheet is a summary. It may not cover all possible information. If you have questions about this medicine, talk to your doctor, pharmacist, or health care provider.  2014, [employer].  (2007-07-05 10:35:49)  

## 2013-06-13 ENCOUNTER — Encounter: Payer: Self-pay | Admitting: Oncology

## 2013-06-13 NOTE — Progress Notes (Signed)
Informed the patient's daughter that since he does not have insurance I need to apply for free medication through the drug company and their application states they have up to two weeks to process applications, therefore he will not have this by 06/18/13.

## 2013-06-15 ENCOUNTER — Telehealth: Payer: Self-pay | Admitting: *Deleted

## 2013-06-15 NOTE — Telephone Encounter (Signed)
This RN spoke with patient informing him that [facility]len[facility] is working on getting his Temodar prescription filled. Please keep the radiation appointments for next week. He doesn't need to take Compazine before the treatments, that is only with the Temodar. Patient verbalized understanding.

## 2013-06-17 ENCOUNTER — Encounter: Payer: Self-pay | Admitting: Oncology

## 2013-06-17 NOTE — Progress Notes (Signed)
[place]  [phone] Fax:([phone]   MEDICAL ONCOLOGY - INITIAL CONSULATION    Referral MD Dr. [name]  Reason for Referral: 48 y/o male with new diagnosis of low grade astrocytoma  No chief complaint on file. : GBM Tobacco abuse Seizure GERD   HPI:  Patient presented with new onset seizure 05/18/13 and was evaluated in ER. Work p with CT scan revealed ill defined low attenuation in the medial right frontal lobe and the inferior right frontal lobe. MRI of brain showed infilterative mass in the anteromedial right frontal lobe. Patient underwent stereotactic right frontal biopsy on Jan 22. Pathology showed low grade astrocytoma. He was seen by Dr. Tammi Klippel and he was also referred to Medical oncology for further management with possible concurrent temodar with radiation therapy. He is without any complaints. He is accompanied by his friend.    Past Medical History  Diagnosis Date  . Shortness of breath   . Hepatitis   . DM (diabetes mellitus) 08/09/2011    A1c at diagnosis = 12.4  . GERD (gastroesophageal reflux disease) 08/09/2011  . Seizure 05/19/2013  . Astrocytoma of frontal lobe 05/31/2013  :  Past Surgical History  Procedure Laterality Date  . Appendectomy    . Brain biopsy N/A 05/24/2013    Procedure: Craniotomy for open biopsy;  Surgeon: Winfield Cunas, MD;  Location: [facility];  Service: Neurosurgery;  Laterality: N/A;  Craniotomy for open biopsy  :  Current Outpatient Prescriptions  Medication Sig Dispense Refill  . metFORMIN (GLUCOPHAGE) 1000 MG tablet Take 1 tablet (1,000 mg total) by mouth daily.  90 tablet  1  . oxyCODONE-acetaminophen (PERCOCET/ROXICET) 5-325 MG per tablet Take 1-2 tablets by mouth every 6 (six) hours as needed for severe pain.  90 tablet  0  . phenytoin (DILANTIN) 100 MG ER capsule Take 1 capsule (100 mg total) by mouth 3 (three) times daily.  90 capsule  11  . acetaminophen-codeine (TYLENOL #3)  300-30 MG per tablet Take 1 tablet by mouth every 6 (six) hours as needed for moderate pain.  60 tablet  0  . Alum & Mag Hydroxide-Simeth (MAGIC MOUTHWASH W/LIDOCAINE) SOLN Take 5 mLs by mouth 4 (four) times daily as needed for mouth pain.  500 mL  3  . dexamethasone (DECADRON) 4 MG tablet Take one tablet twice per day for 4 days, then one tablet once per day for 4 days.  12 tablet  0  . LORazepam (ATIVAN) 1 MG tablet Take 1 tablet (1 mg total) by mouth every 8 (eight) hours.  60 tablet  0  . prochlorperazine (COMPAZINE) 10 MG tablet Take 1/2 hour prior to taking temodar daily  30 tablet  4  . sulfamethoxazole-trimethoprim (BACTRIM DS,SEPTRA DS) 800-160 MG per tablet Take 1 tablet daily  30 tablet  7  . temozolomide (TEMODAR) 140 MG capsule Take 1 capsule daily for the duration of radiation therapy. May take on an empty stomach or at bedtime to decrease nausea & vomiting.  30 capsule  2   No current facility-administered medications for this visit.     No Known Allergies:  Family History  Problem Relation Age of Onset  . Breast cancer Mother   . Diabetes Father   . Heart disease Father   . Diabetes Paternal Uncle   . Diabetes Brother   :  History   Social History  . Marital Status: Significant Other    Spouse Name: N/A    Number  of Children: N/A  . Years of Education: N/A   Occupational History  . Not on file.   Social History Main Topics  . Smoking status: Current Every Day Smoker -- 0.50 packs/day    Types: Cigarettes  . Smokeless tobacco: Never Used     Comment: Trying to quit.  . Alcohol Use: Yes     Comment: social  . Drug Use: Yes    Special: Marijuana     Comment: Marijuana daily  . Sexual Activity: Not on file   Other Topics Concern  . Not on file   Social History Narrative   L[place]. He does not work now.   :  A comprehensive review of systems was negative.  Exam: @IPVITALS @  General:  well-nourished in no acute distress.   Eyes:  no scleral icterus.  ENT:  There were no oropharyngeal lesions.  Neck was without thyromegaly.  Lymphatics:  Negative cervical, supraclavicular or axillary adenopathy.  Respiratory: lungs were clear bilaterally without wheezing or crackles.  Cardiovascular:  Regular rate and rhythm, S1/S2, without murmur, rub or gallop.  There was no pedal edema.  GI:  abdomen was soft, flat, nontender, nondistended, without organomegaly.  Muscoloskeletal:  no spinal tenderness of palpation of vertebral spine.  Skin exam was without echymosis, petichae.  Neuro exam was nonfocal.  Patient was able to get on and off exam table without assistance.  Gait was normal.  Patient was alerted and oriented.  Attention was good.   Language was appropriate.  Mood was normal without depression.  Speech was not pressured.  Thought content was not tangential.     Lab Results  Component Value Date   WBC 9.9 06/12/2013   HGB 17.1 06/12/2013   HCT 49.5 06/12/2013   PLT 245 06/12/2013   GLUCOSE 276* 06/12/2013   CHOL 184 01/21/2012   TRIG 191* 01/21/2012   HDL 31* 01/21/2012   LDLCALC 115* 01/21/2012   ALT 30 06/12/2013   AST 22 06/12/2013   NA 136 06/12/2013   K 3.7 06/12/2013   CL 105 05/21/2013   CREATININE 0.9 06/12/2013   BUN 11.9 06/12/2013   CO2 24 06/12/2013   TSH 1.073 07/18/2011   INR 1.04 05/19/2013   HGBA1C 11.1* 05/19/2013   MICROALBUR 0.50 07/18/2011    Ct Head Wo Contrast  05/25/2013   CLINICAL DATA:  Follow-up status post brain biopsy.  EXAM: CT HEAD WITHOUT CONTRAST  TECHNIQUE: Contiguous axial images were obtained from the base of the skull through the vertex without intravenous contrast.  COMPARISON:  Prior MRI from 05/18/2013.  FINDINGS: Postoperative changes from interval right frontal craniotomy with biopsy of previously identified infiltrative right frontal lobe mass are seen. Skin staples with edema and soft tissue emphysema overlie the craniotomy site. Scattered pneumocephalus is seen subjacent to the craniotomy  site, anterior to the right frontal lobe, as well as at the biopsy site in the right frontal lobe. Minimal hyperdensity is seen at this site of biopsy (series 2, image 24), likely relates to a small amount of blood products. Vasogenic edema within the subcortical white matter of the anterior right frontal lobe is similar. Additional subtle hypodensity within the subcortical white matter of the left frontal lobe is also similar.  No midline shift or hydrocephalus. No acute large vessel territory infarct. No new mass lesion.  Orbits are intact. Paranasal sinuses and mastoid air cells remain clear.  IMPRESSION: Postoperative changes from interval right frontal craniotomy with biopsy of  previously identified infiltrative right frontal lobe mass without complication. Minimal hyperdensity at the biopsy site likely represents a small amount of blood products.   Electronically Signed   By: Jeannine Boga M.D.   On: 05/25/2013 06:33   Ct Head Wo Contrast  05/18/2013   CLINICAL DATA:  Seizure like activity.  EXAM: CT HEAD WITHOUT CONTRAST  TECHNIQUE: Contiguous axial images were obtained from the base of the skull through the vertex without intravenous contrast.  COMPARISON:  No priors.  FINDINGS: Slightly ill-defined area of low attenuation in the medial aspect of the right frontal lobe best demonstrated on image 19 of series 2. In addition, in the inferior aspect of the right frontal lobe (image 11 of series 2) there is a larger area of ill-defined low-attenuation. No definite acute intracranial hemorrhage, significant mass effect, hydrocephalus or abnormal intra or extra-axial fluid collections are noted. Visualized paranasal sinuses and mastoids are well pneumatized, with exception of some very mild mucosal thickening in the ethmoid sinuses bilaterally and the right maxillary sinus. No acute displaced skull fractures are identified.  IMPRESSION: 1. Areas of ill-defined low attenuation in the medial aspect of the  right frontal lobe and inferior aspect of the right frontal lobe. These could represent areas of subacute ischemia, however, the possibility of areas of edema related to underlying mass lesions is not excluded. Further characterization of these findings with followup MRI of the brain with and without IV gadolinium is strongly recommended. These results were called by telephone at the time of interpretation on 05/18/2013 at 7:52 PM to Dr. Pattricia Boss, who verbally acknowledged these results.   Electronically Signed   By: Vinnie Langton M.D.   On: 05/18/2013 19:57   Ct Head W Contrast  05/23/2013   CLINICAL DATA:  Stealth protocol. Recent seizure with right foraminal brain mass on imaging.  EXAM: CT HEAD WITH CONTRAST  TECHNIQUE: Contiguous axial images were obtained from the base of the skull through the vertex with intravenous contrast.  CONTRAST:  1108mL OMNIPAQUE IOHEXOL 300 MG/ML  SOLN  COMPARISON:  Head CT and brain MRI 05/18/2013  FINDINGS: Infiltrative right frontal lobe mass is better demonstrated on recent MRI. Again seen are areas of ill defined hypoattenuation within right frontal white matter which may reflect edema or infiltrative tumor, as well as areas of abnormal soft tissue density within the more superior aspect of the right frontal lobe corresponding to the known lesion. There is no midline shift. Ventricles are unchanged without evidence of hydrocephalus. No definite abnormal enhancement is identified. There is no evidence of intracranial hemorrhage or extra-axial fluid collection. Mastoid air cells are clear. Minimal maxillary sinus mucosal thickening is noted. Orbits are unremarkable.  IMPRESSION: Successful preoperative localization of infiltrative right frontal lobe mass.   Electronically Signed   By: Logan Bores   On: 05/23/2013 09:48   Mr Brain W Wo Contrast  05/19/2013   CLINICAL DATA:  New onset seizure  EXAM: MRI HEAD WITHOUT AND WITH CONTRAST  TECHNIQUE: Multiplanar, multiecho  pulse sequences of the brain and surrounding structures were obtained without and with intravenous contrast.  CONTRAST:  71mL MULTIHANCE GADOBENATE DIMEGLUMINE 529 MG/ML IV SOLN  COMPARISON:  Prior CT from 05/18/2013  FINDINGS: An ill-defined infiltrating masslike region is seen within the anteromedial right frontal lobe (series 5, image 20). This region demonstrates fairly homogeneous hyperintense T2/FLAIR signal with. This region demonstrates fairly homogeneous hyperintense T2/FLAIR signal with hypo intense T1 signal intensity without post-contrast enhancement. There is expansion and  thickening of the overlying cortex within the anteromedial right frontal lobe. Abnormal FLAIR signal extends across the corpus callosum in involves the contralateral subcortical white matter of the left frontal lobe. Hyperintense FLAIR signal also extends inferiorly into the inferior right frontal lobe. There is no significant mass effect or midline shift. 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  No other mass lesions identified. Ventricles are normal in size without evidence of hydrocephalus. There is no extra-axial fluid collection.  No diffusion-weighted signal abnormality is identified to suggest acute intracranial infarct. Normal flow voids seen within the intracranial vasculature.  Cervicomedullary junction is normal. The globes demonstrate a normal appearance. Optic nerves are normal. Pituitary gland is within normal limits.  No abnormal enhancement identified within the brain.  Paranasal sinuses and mastoid air cells are clear.  Calvarium demonstrates a normal appearance with normal signal intensity. Visualized upper cervical spine within normal limits.  IMPRESSION: Ill-defined infiltrative mass within the anteromedial right frontal lobe without associated enhancement, worrisome for primary low grade astrocytoma. Abnormal FLAIR signal extending across the corpus callosum into the contralateral  subcortical left frontal lobe is suspicious for infiltrative tumor. No significant mass effect or midline shift.   Electronically Signed   By: Jeannine Boga M.D.   On: 05/19/2013 00:07   Dg Chest Port 1 View  05/18/2013   CLINICAL DATA:  cough  EXAM: PORTABLE CHEST - 1 VIEW  COMPARISON:  DG CHEST 2 VIEW dated 07/18/2011  FINDINGS: Normal mediastinum and cardiac silhouette. Normal pulmonary vasculature. No evidence of effusion, infiltrate, or pneumothorax. No acute bony abnormality.  IMPRESSION: No acute cardiopulmonary process.   Electronically Signed   By: Suzy Bouchard M.D.   On: 05/18/2013 19:52      Ct Head Wo Contrast  05/25/2013   CLINICAL DATA:  Follow-up status post brain biopsy.  EXAM: CT HEAD WITHOUT CONTRAST  TECHNIQUE: Contiguous axial images were obtained from the base of the skull through the vertex without intravenous contrast.  COMPARISON:  Prior MRI from 05/18/2013.  FINDINGS: Postoperative changes from interval right frontal craniotomy with biopsy of previously identified infiltrative right frontal lobe mass are seen. Skin staples with edema and soft tissue emphysema overlie the craniotomy site. Scattered pneumocephalus is seen subjacent to the craniotomy site, anterior to the right frontal lobe, as well as at the biopsy site in the right frontal lobe. Minimal hyperdensity is seen at this site of biopsy (series 2, image 24), likely relates to a small amount of blood products. Vasogenic edema within the subcortical white matter of the anterior right frontal lobe is similar. Additional subtle hypodensity within the subcortical white matter of the left frontal lobe is also similar.  No midline shift or hydrocephalus. No acute large vessel territory infarct. No new mass lesion.  Orbits are intact. Paranasal sinuses and mastoid air cells remain clear.  IMPRESSION: Postoperative changes from interval right frontal craniotomy with biopsy of previously identified infiltrative right  frontal lobe mass without complication. Minimal hyperdensity at the biopsy site likely represents a small amount of blood products.   Electronically Signed   By: Jeannine Boga M.D.   On: 05/25/2013 06:33   Ct Head Wo Contrast  05/18/2013   CLINICAL DATA:  Seizure like activity.  EXAM: CT HEAD WITHOUT CONTRAST  TECHNIQUE: Contiguous axial images were obtained from the base of the skull through the vertex without intravenous contrast.  COMPARISON:  No priors.  FINDINGS: Slightly ill-defined area of low attenuation in the medial aspect  of the right frontal lobe best demonstrated on image 19 of series 2. In addition, in the inferior aspect of the right frontal lobe (image 11 of series 2) there is a larger area of ill-defined low-attenuation. No definite acute intracranial hemorrhage, significant mass effect, hydrocephalus or abnormal intra or extra-axial fluid collections are noted. Visualized paranasal sinuses and mastoids are well pneumatized, with exception of some very mild mucosal thickening in the ethmoid sinuses bilaterally and the right maxillary sinus. No acute displaced skull fractures are identified.  IMPRESSION: 1. Areas of ill-defined low attenuation in the medial aspect of the right frontal lobe and inferior aspect of the right frontal lobe. These could represent areas of subacute ischemia, however, the possibility of areas of edema related to underlying mass lesions is not excluded. Further characterization of these findings with followup MRI of the brain with and without IV gadolinium is strongly recommended. These results were called by telephone at the time of interpretation on 05/18/2013 at 7:52 PM to Dr. Pattricia Boss, who verbally acknowledged these results.   Electronically Signed   By: Vinnie Langton M.D.   On: 05/18/2013 19:57   Ct Head W Contrast  05/23/2013   CLINICAL DATA:  Stealth protocol. Recent seizure with right foraminal brain mass on imaging.  EXAM: CT HEAD WITH CONTRAST   TECHNIQUE: Contiguous axial images were obtained from the base of the skull through the vertex with intravenous contrast.  CONTRAST:  176mL OMNIPAQUE IOHEXOL 300 MG/ML  SOLN  COMPARISON:  Head CT and brain MRI 05/18/2013  FINDINGS: Infiltrative right frontal lobe mass is better demonstrated on recent MRI. Again seen are areas of ill defined hypoattenuation within right frontal white matter which may reflect edema or infiltrative tumor, as well as areas of abnormal soft tissue density within the more superior aspect of the right frontal lobe corresponding to the known lesion. There is no midline shift. Ventricles are unchanged without evidence of hydrocephalus. No definite abnormal enhancement is identified. There is no evidence of intracranial hemorrhage or extra-axial fluid collection. Mastoid air cells are clear. Minimal maxillary sinus mucosal thickening is noted. Orbits are unremarkable.  IMPRESSION: Successful preoperative localization of infiltrative right frontal lobe mass.   Electronically Signed   By: Logan Bores   On: 05/23/2013 09:48   Mr Brain W Wo Contrast  05/19/2013   CLINICAL DATA:  New onset seizure  EXAM: MRI HEAD WITHOUT AND WITH CONTRAST  TECHNIQUE: Multiplanar, multiecho pulse sequences of the brain and surrounding structures were obtained without and with intravenous contrast.  CONTRAST:  79mL MULTIHANCE GADOBENATE DIMEGLUMINE 529 MG/ML IV SOLN  COMPARISON:  Prior CT from 05/18/2013  FINDINGS: An ill-defined infiltrating masslike region is seen within the anteromedial right frontal lobe (series 5, image 20). This region demonstrates fairly homogeneous hyperintense T2/FLAIR signal with. This region demonstrates fairly homogeneous hyperintense T2/FLAIR signal with hypo intense T1 signal intensity without post-contrast enhancement. There is expansion and thickening of the overlying cortex within the anteromedial right frontal lobe. Abnormal FLAIR signal extends across the corpus callosum in  involves the contralateral subcortical white matter of the left frontal lobe. Hyperintense FLAIR signal also extends inferiorly into the inferior right frontal lobe. There is no significant mass effect or midline shift. 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  No other mass lesions identified. Ventricles are normal in size without evidence of hydrocephalus. There is no extra-axial fluid collection.  No diffusion-weighted signal abnormality is identified to suggest acute intracranial infarct. Normal flow  voids seen within the intracranial vasculature.  Cervicomedullary junction is normal. The globes demonstrate a normal appearance. Optic nerves are normal. Pituitary gland is within normal limits.  No abnormal enhancement identified within the brain.  Paranasal sinuses and mastoid air cells are clear.  Calvarium demonstrates a normal appearance with normal signal intensity. Visualized upper cervical spine within normal limits.  IMPRESSION: Ill-defined infiltrative mass within the anteromedial right frontal lobe without associated enhancement, worrisome for primary low grade astrocytoma. Abnormal FLAIR signal extending across the corpus callosum into the contralateral subcortical left frontal lobe is suspicious for infiltrative tumor. No significant mass effect or midline shift.   Electronically Signed   By: Jeannine Boga M.D.   On: 05/19/2013 00:07   Dg Chest Port 1 View  05/18/2013   CLINICAL DATA:  cough  EXAM: PORTABLE CHEST - 1 VIEW  COMPARISON:  DG CHEST 2 VIEW dated 07/18/2011  FINDINGS: Normal mediastinum and cardiac silhouette. Normal pulmonary vasculature. No evidence of effusion, infiltrate, or pneumothorax. No acute bony abnormality.  IMPRESSION: No acute cardiopulmonary process.   Electronically Signed   By: Suzy Bouchard M.D.   On: 05/18/2013 19:52    Assessment and Plan:  48 year old male with low grade astrocytoma measuring about 8 cm. S/p biopsy. He will receive  RT and with radiation patient may benefit with concurrent temodar. Today we discussed the rational for concurrent temodar/RT. We discussed the phase II trial noted in Dr. Alfredo Bach notes. Patient fits the criteria for concurrent temodar.  Risks and benefits of temodar were discussed with the patient and his friend. They understand the potential side effects including but not limited to nausea, vomiting, myelosuppression and opportunistic infections.  We will obtain the temodar from the pharmaceutical company since patient does not have insurance. Presription for bactrim and zofran sent to the [facility].  He will return in 2 -3 weeks for follow up.  The length of time of the face-to-face encounter was 60    minutes. More than 50% of time was spent counseling and coordination of care.  Marcy Panning, MD Medical/Oncology [facility] [phone] (beeper) [phone] (Office)

## 2013-06-18 ENCOUNTER — Ambulatory Visit
Admission: RE | Admit: 2013-06-18 | Discharge: 2013-06-18 | Disposition: A | Discharge status: Home / Self Care | Payer: No Typology Code available for payment source | Source: Ambulatory Visit | Attending: Radiation Oncology | Admitting: Radiation Oncology

## 2013-06-19 ENCOUNTER — Ambulatory Visit: Payer: MEDICAID

## 2013-06-20 ENCOUNTER — Ambulatory Visit: Admission: RE | Admit: 2013-06-20 | Payer: MEDICAID | Source: Ambulatory Visit

## 2013-06-20 ENCOUNTER — Ambulatory Visit: Payer: MEDICAID | Admitting: Radiation Oncology

## 2013-06-21 ENCOUNTER — Ambulatory Visit
Admission: RE | Admit: 2013-06-21 | Discharge: 2013-06-21 | Disposition: A | Discharge status: Home / Self Care | Payer: No Typology Code available for payment source | Source: Ambulatory Visit | Attending: Radiation Oncology | Admitting: Radiation Oncology

## 2013-06-22 ENCOUNTER — Ambulatory Visit
Admission: RE | Admit: 2013-06-22 | Discharge: 2013-06-22 | Disposition: A | Discharge status: Home / Self Care | Payer: No Typology Code available for payment source | Source: Ambulatory Visit | Attending: Radiation Oncology | Admitting: Radiation Oncology

## 2013-06-25 ENCOUNTER — Ambulatory Visit
Admission: RE | Admit: 2013-06-25 | Discharge: 2013-06-25 | Disposition: A | Discharge status: Home / Self Care | Payer: No Typology Code available for payment source | Source: Ambulatory Visit | Attending: Radiation Oncology | Admitting: Radiation Oncology

## 2013-06-26 ENCOUNTER — Ambulatory Visit
Admission: RE | Admit: 2013-06-26 | Discharge: 2013-06-26 | Disposition: A | Discharge status: Home / Self Care | Payer: No Typology Code available for payment source | Source: Ambulatory Visit | Attending: Radiation Oncology | Admitting: Radiation Oncology

## 2013-06-26 ENCOUNTER — Telehealth: Payer: Self-pay | Admitting: Emergency Medicine

## 2013-06-26 ENCOUNTER — Encounter: Payer: Self-pay | Admitting: Radiation Oncology

## 2013-06-26 VITALS — BP 120/82 | HR 91 | Resp 16

## 2013-06-26 DIAGNOSIS — C711 Malignant neoplasm of frontal lobe: Secondary | ICD-10-CM

## 2013-06-26 NOTE — Telephone Encounter (Signed)
Patient called; stated her received the Temodar in the mail today. Patient asking when he is to start this medication. Instructed patient to start today and to take one at bedtime to decrease the chances of nausea. Patient verbalized understanding.

## 2013-06-26 NOTE — Progress Notes (Signed)
Jonathan Barker has resolved. Patient has no received his temodar yet. Right front incision well approximated expect for a 1 cm raised area with yellow puss. Reports he manages his right frontal headaches with percocet. Denies nausea or vomiting. Reports dizziness upon standing.

## 2013-06-26 NOTE — Progress Notes (Signed)
Hand delivered wound specimen to [facility] in the laboratory. Awaiting results. Spoke with Nira Conn, RN in the surgery center. Had Nira Conn, RN relay to Dr. Christella Noa that the patient was evaluated following radiation treatment, explained wound culture was collected, and that treatment would be held at his request until wound was evaluated by him.

## 2013-06-26 NOTE — Progress Notes (Signed)
   Weekly Management Note:  outpatient Current Dose:  9 Gy  Projected Dose: 54 Gy   Narrative:  The patient presents for routine under treatment assessment.  CBCT/MVCT images/Port film x-rays were reviewed.  The chart was checked. Jonathan Barker saw me today after his 5th fraction of RT. He hasn't received temodar yet . HA's helped by percocet. Thrush resolved. No N/V  but some dizziness when standing.  Physical Findings:  blood pressure is 120/82 and his pulse is 91. His respiration is 16.  NAD, no oral thrush. Right anterior scalp: at incision, there is erythema and raised areas of scalp swelling (size of two capers) - when pressed, pus exudes from this area.  CBC    Component Value Date/Time   WBC 9.9 06/12/2013 1450   WBC 10.5 05/20/2013 0500   RBC 5.56 06/12/2013 1450   RBC 5.15 05/20/2013 0500   HGB 17.1 06/12/2013 1450   HGB 16.0 05/20/2013 0500   HCT 49.5 06/12/2013 1450   HCT 46.0 05/20/2013 0500   PLT 245 06/12/2013 1450   PLT 268 05/20/2013 0500   MCV 89.1 06/12/2013 1450   MCV 89.3 05/20/2013 0500   MCH 30.8 06/12/2013 1450   MCH 31.1 05/20/2013 0500   MCHC 34.6 06/12/2013 1450   MCHC 34.8 05/20/2013 0500   RDW 12.5 06/12/2013 1450   RDW 12.3 05/20/2013 0500   LYMPHSABS 2.8 06/12/2013 1450   LYMPHSABS 3.0 05/18/2013 1908   MONOABS 1.0* 06/12/2013 1450   MONOABS 1.3* 05/18/2013 1908   EOSABS 0.2 06/12/2013 1450   EOSABS 0.3 05/18/2013 1908   BASOSABS 0.1 06/12/2013 1450   BASOSABS 0.1 05/18/2013 1908     CMP     Component Value Date/Time   NA 136 06/12/2013 1451   NA 141 05/21/2013 0510   K 3.7 06/12/2013 1451   K 4.0 05/21/2013 0510   CL 105 05/21/2013 0510   CO2 24 06/12/2013 1451   CO2 24 05/21/2013 0510   GLUCOSE 276* 06/12/2013 1451   GLUCOSE 206* 05/21/2013 0510   BUN 11.9 06/12/2013 1451   BUN 14 05/21/2013 0510   CREATININE 0.9 06/12/2013 1451   CREATININE 0.80 05/21/2013 0510   CALCIUM 9.4 06/12/2013 1451   CALCIUM 8.9 05/21/2013 0510   PROT 7.5 06/12/2013 1451   PROT 7.5  05/18/2013 1908   ALBUMIN 3.9 06/12/2013 1451   ALBUMIN 3.9 05/18/2013 1908   AST 22 06/12/2013 1451   AST 24 05/18/2013 1908   ALT 30 06/12/2013 1451   ALT 28 05/18/2013 1908   ALKPHOS 122 06/12/2013 1451   ALKPHOS 89 05/18/2013 1908   BILITOT 0.35 06/12/2013 1451   BILITOT 0.2* 05/18/2013 1908   GFRNONAA >90 05/21/2013 0510   GFRAA >90 05/21/2013 0510     Impression:  The patient is tolerating radiotherapy.   Plan:  Continue radiotherapy as planned. [name], RN will get culture of this wound and see if patient can followup with NSU today for evaluation of scalp incision.  -----------------------------------  Eppie Gibson, MD

## 2013-06-27 ENCOUNTER — Ambulatory Visit: Admission: RE | Admit: 2013-06-27 | Payer: No Typology Code available for payment source | Source: Ambulatory Visit

## 2013-06-28 ENCOUNTER — Telehealth: Payer: Self-pay | Admitting: Radiation Oncology

## 2013-06-28 ENCOUNTER — Ambulatory Visit: Payer: MEDICAID

## 2013-06-28 LAB — WOUND CULTURE

## 2013-06-28 NOTE — Telephone Encounter (Signed)
Phoned patient to question if he saw Dr. Christella Noa yesterday and planned to present for treatment. No answer. Left a message requesting a return call.

## 2013-06-28 NOTE — Telephone Encounter (Signed)
Attempting to reach patient. No answer. Left message requesting return call.

## 2013-06-29 ENCOUNTER — Telehealth: Payer: Self-pay | Admitting: Radiation Oncology

## 2013-06-29 ENCOUNTER — Ambulatory Visit (INDEPENDENT_AMBULATORY_CARE_PROVIDER_SITE_OTHER): Payer: No Typology Code available for payment source | Admitting: Internal Medicine

## 2013-06-29 ENCOUNTER — Ambulatory Visit: Payer: MEDICAID

## 2013-06-29 ENCOUNTER — Ambulatory Visit: Payer: MEDICAID | Admitting: Radiation Oncology

## 2013-06-29 ENCOUNTER — Encounter: Payer: Self-pay | Admitting: Internal Medicine

## 2013-06-29 VITALS — BP 141/85 | HR 105 | Temp 97.2°F | Ht 71.0 in | Wt 180.8 lb

## 2013-06-29 DIAGNOSIS — B957 Other staphylococcus as the cause of diseases classified elsewhere: Secondary | ICD-10-CM

## 2013-06-29 DIAGNOSIS — IMO0002 Reserved for concepts with insufficient information to code with codable children: Secondary | ICD-10-CM

## 2013-06-29 DIAGNOSIS — C711 Malignant neoplasm of frontal lobe: Secondary | ICD-10-CM

## 2013-06-29 DIAGNOSIS — K089 Disorder of teeth and supporting structures, unspecified: Secondary | ICD-10-CM

## 2013-06-29 DIAGNOSIS — E1165 Type 2 diabetes mellitus with hyperglycemia: Secondary | ICD-10-CM

## 2013-06-29 LAB — GLUCOSE, CAPILLARY: Glucose-Capillary: 260 mg/dL — ABNORMAL HIGH (ref 70–99)

## 2013-06-29 NOTE — Patient Instructions (Signed)
It is wonderful your family is helping you out. Glad you are tolerating treatment well.   When you are ready to discuss your diabetes make an appt. Otherwise continue your pills and lantus.   Best of luck

## 2013-06-29 NOTE — Assessment & Plan Note (Signed)
Pt had recent wound culture from scalp incision site. PCN prescription sent in by his neurosurgeon. Pt has no systemic signs of overwhelming infection. VSS -bacitracin samples given to patient

## 2013-06-29 NOTE — Progress Notes (Signed)
   Subjective:    Patient ID: Jonathan Barker, male    DOB: 08-19-1965, 48 y.o.   MRN: [id number]  HPI  [name] is 48 yo man pmh as listed below presents for dental referral and disability letter.  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 Pt is undergoing radiation and chemotherapy with Temodar followed by Dr. Isidore Moos and Dr. Humphrey Rolls. Pt had a recent evaluation and wound culture done from frontal craniotomy site that grew coag. Negative staph. He states that he has a prescription for PCN for the infection by his neurosurgeon. He is tolerating his radiation well and his chemo pills.   His family has become super supportive helping with transportation, living, and medications. He feels well supported and smokes marijuana joints to improve his mood. He also continues to smoke tobacco as well.   In terms of his DM he takes lantus 20U qhs and the metformin. He doesn't have his meter and has not been checking his sugars at all recently. He states that his diet is mostly breads and moutain dew but that his daughter is now helping him with diabetic diet and portion sizing.   Review of Systems  Constitutional: Negative for fever, chills, diaphoresis, appetite change and fatigue.  HENT: Negative for hearing loss, tinnitus, trouble swallowing and voice change.   Eyes: Negative for photophobia and visual disturbance.  Respiratory: Negative for cough and shortness of breath.   Cardiovascular: Negative for chest pain, palpitations and leg swelling.  Gastrointestinal: Negative for nausea, vomiting, abdominal pain and constipation.  Musculoskeletal: Negative for gait problem and neck pain.  Neurological: Negative for dizziness, seizures, syncope, facial asymmetry, speech difficulty, weakness, light-headedness and headaches.    Past Medical History  Diagnosis Date  . Shortness of breath   . Hepatitis   . DM (diabetes mellitus) 08/09/2011    A1c  at diagnosis = 12.4  . GERD (gastroesophageal reflux disease) 08/09/2011  . Seizure 05/19/2013  . Astrocytoma of frontal lobe 05/31/2013   Social, surgical, family history reviewed with patient and updated in appropriate chart locations.     Objective:   Physical Exam  Filed Vitals:   06/29/13 1325  BP: 141/85  Pulse: 105  Temp: 97.2 F (36.2 C)   General: sitting in chair, NAD HEENT: PERRL, EOMI, no scleral icterus, 5 cm transverse incision with some dried pus, nttp Cardiac: RRR, no rubs, murmurs or gallops Pulm: clear to auscultation bilaterally, moving normal volumes of air Abd: soft, nontender, nondistended, BS present Ext: warm and well perfused, no pedal edema Neuro: alert and oriented X3, cranial nerves II-XII grossly intact    Assessment & Plan:  Please see problem oriented charting  Pt discussed with Dr. Ellwood Dense

## 2013-06-29 NOTE — Assessment & Plan Note (Addendum)
Pt is undergoing chemo with temador and RT. Tolerating well.  -wants a dental referral and this was made along with forwarding to Dr. Humphrey Rolls his oncologist as pt has no insurance -handicap sticker application also provided to patient today -letter to help with disability provided to patient

## 2013-06-29 NOTE — Telephone Encounter (Addendum)
Spoke with Caryl Pina, med tech, for Dr. Christella Noa informed her of wound culture results. Faxed results to her attention. She verbalized she would pass this along to Dr. Christella Noa. Caryl Pina reports Dr. Christella Noa prescribed the patient Penicillin on Wednesday. Received faxed confirmation of delivery.  Phoned patient. Questioned if he picked up his penicillin. Patient reports he has not and is working on getting a ride to the [employer]. Expressed importance of this medication and test results. Patient verbalized understanding. Patient reports he was directed by Dr. Christella Noa to stop radiation for now and follow up with him on 3/6 at 1000. Provided patient with this writer's contact number. Encouraged patient to call this Probation officer after he sees [place] on 3/6. Patient verbalized understanding.   Informed Miranda, RT for tomo of these findings.

## 2013-06-29 NOTE — Telephone Encounter (Signed)
Message copied by Heywood Footman on Fri Jun 29, 2013 11:29 AM ------      Message from: [place], [place]      Created: Fri Jun 29, 2013 10:22 AM       Please fax to Dr. Christella Noa ------

## 2013-07-02 ENCOUNTER — Ambulatory Visit: Payer: MEDICAID

## 2013-07-03 ENCOUNTER — Ambulatory Visit: Payer: MEDICAID

## 2013-07-04 ENCOUNTER — Telehealth: Payer: Self-pay | Admitting: Radiation Oncology

## 2013-07-04 ENCOUNTER — Ambulatory Visit: Payer: MEDICAID

## 2013-07-04 ENCOUNTER — Other Ambulatory Visit: Payer: Self-pay | Admitting: Radiation Oncology

## 2013-07-04 DIAGNOSIS — C711 Malignant neoplasm of frontal lobe: Secondary | ICD-10-CM

## 2013-07-04 MED ORDER — OXYCODONE-ACETAMINOPHEN 5-325 MG PO TABS: 1.0000 | ORAL_TABLET | Freq: Four times a day (QID) | ORAL | Status: DC | PRN

## 2013-07-04 NOTE — Progress Notes (Signed)
Case discussed with Dr. Sadek at the time of the visit.  We reviewed the resident's history and exam and pertinent patient test results.  I agree with the assessment, diagnosis, and plan of care documented in the resident's note. 

## 2013-07-04 NOTE — Telephone Encounter (Signed)
Opened in error

## 2013-07-05 ENCOUNTER — Ambulatory Visit: Payer: MEDICAID

## 2013-07-06 ENCOUNTER — Ambulatory Visit: Payer: MEDICAID

## 2013-07-06 ENCOUNTER — Ambulatory Visit: Payer: No Typology Code available for payment source | Admitting: Dietician

## 2013-07-06 ENCOUNTER — Ambulatory Visit: Payer: No Typology Code available for payment source

## 2013-07-06 ENCOUNTER — Other Ambulatory Visit: Payer: Self-pay | Admitting: Internal Medicine

## 2013-07-09 ENCOUNTER — Telehealth: Payer: Self-pay | Admitting: Radiation Oncology

## 2013-07-09 ENCOUNTER — Ambulatory Visit: Payer: MEDICAID

## 2013-07-09 NOTE — Telephone Encounter (Signed)
Phoned patient to question if he had been released by Dr. Christella Noa and could start back with radiation therapy. Patient reports that Dr. Christella Noa instructed him to "take another week off." Informed Faith, RT on tomo of this finding and Dr. Tammi Klippel.

## 2013-07-10 ENCOUNTER — Ambulatory Visit: Payer: MEDICAID

## 2013-07-11 ENCOUNTER — Other Ambulatory Visit (HOSPITAL_BASED_OUTPATIENT_CLINIC_OR_DEPARTMENT_OTHER): Payer: No Typology Code available for payment source

## 2013-07-11 ENCOUNTER — Telehealth: Payer: Self-pay | Admitting: Oncology

## 2013-07-11 ENCOUNTER — Ambulatory Visit (HOSPITAL_BASED_OUTPATIENT_CLINIC_OR_DEPARTMENT_OTHER): Payer: No Typology Code available for payment source | Admitting: Oncology

## 2013-07-11 ENCOUNTER — Ambulatory Visit: Payer: MEDICAID

## 2013-07-11 ENCOUNTER — Ambulatory Visit: Payer: No Typology Code available for payment source

## 2013-07-11 VITALS — BP 121/86 | HR 93 | Temp 97.6°F | Resp 18 | Ht 71.0 in | Wt 181.3 lb

## 2013-07-11 DIAGNOSIS — C719 Malignant neoplasm of brain, unspecified: Secondary | ICD-10-CM

## 2013-07-11 DIAGNOSIS — C711 Malignant neoplasm of frontal lobe: Secondary | ICD-10-CM

## 2013-07-11 LAB — COMPREHENSIVE METABOLIC PANEL (CC13)
ALK PHOS: 111 U/L (ref 40–150)
ALT: 30 U/L (ref 0–55)
ANION GAP: 10 meq/L (ref 3–11)
AST: 20 U/L (ref 5–34)
Albumin: 4.1 g/dL (ref 3.5–5.0)
BILIRUBIN TOTAL: 0.27 mg/dL (ref 0.20–1.20)
BUN: 11.9 mg/dL (ref 7.0–26.0)
CO2: 21 mEq/L — ABNORMAL LOW (ref 22–29)
Calcium: 9.5 mg/dL (ref 8.4–10.4)
Chloride: 105 mEq/L (ref 98–109)
Creatinine: 0.9 mg/dL (ref 0.7–1.3)
GLUCOSE: 231 mg/dL — AB (ref 70–140)
Potassium: 4.3 mEq/L (ref 3.5–5.1)
Sodium: 136 mEq/L (ref 136–145)
Total Protein: 7.5 g/dL (ref 6.4–8.3)

## 2013-07-11 LAB — CBC WITH DIFFERENTIAL/PLATELET
BASO%: 1.6 % (ref 0.0–2.0)
Basophils Absolute: 0.2 10*3/uL — ABNORMAL HIGH (ref 0.0–0.1)
EOS%: 3.3 % (ref 0.0–7.0)
Eosinophils Absolute: 0.4 10*3/uL (ref 0.0–0.5)
HCT: 47.9 % (ref 38.4–49.9)
HGB: 16.3 g/dL (ref 13.0–17.1)
LYMPH%: 23.4 % (ref 14.0–49.0)
MCH: 30.4 pg (ref 27.2–33.4)
MCHC: 34.1 g/dL (ref 32.0–36.0)
MCV: 89.1 fL (ref 79.3–98.0)
MONO#: 1 10*3/uL — ABNORMAL HIGH (ref 0.1–0.9)
MONO%: 8.7 % (ref 0.0–14.0)
NEUT#: 7 10*3/uL — ABNORMAL HIGH (ref 1.5–6.5)
NEUT%: 63 % (ref 39.0–75.0)
PLATELETS: 276 10*3/uL (ref 140–400)
RBC: 5.37 10*6/uL (ref 4.20–5.82)
RDW: 12.8 % (ref 11.0–14.6)
WBC: 11.2 10*3/uL — ABNORMAL HIGH (ref 4.0–10.3)
lymph#: 2.6 10*3/uL (ref 0.9–3.3)

## 2013-07-11 MED ORDER — SULFAMETHOXAZOLE-TRIMETHOPRIM 800-160 MG PO TABS: 1.0000 | ORAL_TABLET | Freq: Two times a day (BID) | ORAL | Status: DC

## 2013-07-11 NOTE — Telephone Encounter (Signed)
, °

## 2013-07-12 ENCOUNTER — Ambulatory Visit: Payer: MEDICAID

## 2013-07-13 ENCOUNTER — Ambulatory Visit: Payer: MEDICAID

## 2013-07-15 ENCOUNTER — Encounter: Payer: Self-pay | Admitting: Oncology

## 2013-07-15 NOTE — Patient Instructions (Signed)
Smoking Cessation Quitting smoking is important to your health and has many advantages. However, it is not always easy to quit since nicotine is a very addictive drug. Often times, people try 3 times or more before being able to quit. This document explains the best ways for you to prepare to quit smoking. Quitting takes hard work and a lot of effort, but you can do it. ADVANTAGES OF QUITTING SMOKING  You will live longer, feel better, and live better.  Your body will feel the impact of quitting smoking almost immediately.  Within 20 minutes, blood pressure decreases. Your pulse returns to its normal level.  After 8 hours, carbon monoxide levels in the blood return to normal. Your oxygen level increases.  After 24 hours, the chance of having a heart attack starts to decrease. Your breath, hair, and body stop smelling like smoke.  After 48 hours, damaged nerve endings begin to recover. Your sense of taste and smell improve.  After 72 hours, the body is virtually free of nicotine. Your bronchial tubes relax and breathing becomes easier.  After 2 to 12 weeks, lungs can hold more air. Exercise becomes easier and circulation improves.  The risk of having a heart attack, stroke, cancer, or lung disease is greatly reduced.  After 1 year, the risk of coronary heart disease is cut in half.  After 5 years, the risk of stroke falls to the same as a nonsmoker.  After 10 years, the risk of lung cancer is cut in half and the risk of other cancers decreases significantly.  After 15 years, the risk of coronary heart disease drops, usually to the level of a nonsmoker.  If you are pregnant, quitting smoking will improve your chances of having a healthy baby.  The people you live with, especially any children, will be healthier.  You will have extra money to spend on things other than cigarettes. QUESTIONS TO THINK ABOUT BEFORE ATTEMPTING TO QUIT You may want to talk about your answers with your  caregiver.  Why do you want to quit?  If you tried to quit in the past, what helped and what did not?  What will be the most difficult situations for you after you quit? How will you plan to handle them?  Who can help you through the tough times? Your family? Friends? A caregiver?  What pleasures do you get from smoking? What ways can you still get pleasure if you quit? Here are some questions to ask your caregiver:  How can you help me to be successful at quitting?  What medicine do you think would be best for me and how should I take it?  What should I do if I need more help?  What is smoking withdrawal like? How can I get information on withdrawal? GET READY  Set a quit date.  Change your environment by getting rid of all cigarettes, ashtrays, matches, and lighters in your home, car, or work. Do not let people smoke in your home.  Review your past attempts to quit. Think about what worked and what did not. GET SUPPORT AND ENCOURAGEMENT You have a better chance of being successful if you have help. You can get support in many ways.  Tell your family, friends, and co-workers that you are going to quit and need their support. Ask them not to smoke around you.  Get individual, group, or telephone counseling and support. Programs are available at local hospitals and health centers. Call your local health department for   information about programs in your area.  Spiritual beliefs and practices may help some smokers quit.  Download a "quit meter" on your computer to keep track of quit statistics, such as how long you have gone without smoking, cigarettes not smoked, and money saved.  Get a self-help book about quitting smoking and staying off of tobacco. LEARN NEW SKILLS AND BEHAVIORS  Distract yourself from urges to smoke. Talk to someone, go for a walk, or occupy your time with a task.  Change your normal routine. Take a different route to work. Drink tea instead of coffee.  Eat breakfast in a different place.  Reduce your stress. Take a hot bath, exercise, or read a book.  Plan something enjoyable to do every day. Reward yourself for not smoking.  Explore interactive web-based programs that specialize in helping you quit. GET MEDICINE AND USE IT CORRECTLY Medicines can help you stop smoking and decrease the urge to smoke. Combining medicine with the above behavioral methods and support can greatly increase your chances of successfully quitting smoking.  Nicotine replacement therapy helps deliver nicotine to your body without the negative effects and risks of smoking. Nicotine replacement therapy includes nicotine gum, lozenges, inhalers, nasal sprays, and skin patches. Some may be available over-the-counter and others require a prescription.  Antidepressant medicine helps people abstain from smoking, but how this works is unknown. This medicine is available by prescription.  Nicotinic receptor partial agonist medicine simulates the effect of nicotine in your brain. This medicine is available by prescription. Ask your caregiver for advice about which medicines to use and how to use them based on your health history. Your caregiver will tell you what side effects to look out for if you choose to be on a medicine or therapy. Carefully read the information on the package. Do not use any other product containing nicotine while using a nicotine replacement product.  RELAPSE OR DIFFICULT SITUATIONS Most relapses occur within the first 3 months after quitting. Do not be discouraged if you start smoking again. Remember, most people try several times before finally quitting. You may have symptoms of withdrawal because your body is used to nicotine. You may crave cigarettes, be irritable, feel very hungry, cough often, get headaches, or have difficulty concentrating. The withdrawal symptoms are only temporary. They are strongest when you first quit, but they will go away within  10 14 days. To reduce the chances of relapse, try to:  Avoid drinking alcohol. Drinking lowers your chances of successfully quitting.  Reduce the amount of caffeine you consume. Once you quit smoking, the amount of caffeine in your body increases and can give you symptoms, such as a rapid heartbeat, sweating, and anxiety.  Avoid smokers because they can make you want to smoke.  Do not let weight gain distract you. Many smokers will gain weight when they quit, usually less than 10 pounds. Eat a healthy diet and stay active. You can always lose the weight gained after you quit.  Find ways to improve your mood other than smoking. FOR MORE INFORMATION  www.smokefree.gov  Document Released: 04/13/2001 Document Revised: 10/19/2011 Document Reviewed: 07/29/2011 [employer] Patient Information 2014 [employer].  

## 2013-07-15 NOTE — Progress Notes (Signed)
OFFICE PROGRESS NOTE  CC  Jonathan Gallant, MD [place] [address] Dr. [name]  DIAGNOSIS: 48 year old gentleman with new diagnosis of low-grade astrocytoma.  PRIOR THERAPY:  #1 patient originally presented with new onset seizures in January 2015. Workup with CT scan revealed ill-defined low attenuation in the medial right frontal lobe and the inferior right frontal lobe. MRI showed infiltrative mass. Patient underwent stereotactic right frontal biopsy in 05/24/2013. The pathology showed low-grade astrocytoma.  #2 patient has been receiving radiation therapy by Dr. [name]. He also just recently began Temodar 75 mg per meter squared. He understands she will be taking the Temodar on a daily basis.  CURRENT THERAPY: Concurrent radiation therapy with Temodar on a daily basis.  INTERVAL HISTORY: Jonathan Barker 48 y.o. male returns for followup visit. He seems to be tolerating radiation quite nicely. He is denying any headaches double vision blurring of vision fevers chills night sweats. He has no cough. Unfortunately he still continues to smoke. He denies having any hemoptysis. He began Temodar about 2 weeks ago. So far he is tolerating it nicely. He does take Bactrim DS on a daily basis for PCP prophylaxis. He has no nausea or vomiting. Remainder of the 10 point review of systems is negative.  MEDICAL HISTORY: Past Medical History  Diagnosis Date  . Shortness of breath   . Hepatitis   . DM (diabetes mellitus) 08/09/2011    A1c at diagnosis = 12.4  . GERD (gastroesophageal reflux disease) 08/09/2011  . Seizure 05/19/2013  . Astrocytoma of frontal lobe 05/31/2013    ALLERGIES:  has No Known Allergies.  MEDICATIONS:  Current Outpatient Prescriptions  Medication Sig Dispense Refill  . metFORMIN (GLUCOPHAGE) 1000 MG tablet Take 1 tablet (1,000 mg total) by mouth daily.  90 tablet  1  . oxyCODONE-acetaminophen (PERCOCET/ROXICET) 5-325 MG per tablet Take  1-2 tablets by mouth every 6 (six) hours as needed for severe pain.  90 tablet  0  . penicillin v potassium (VEETID) 250 MG tablet Take 250 mg by mouth 3 (three) times daily.      . phenytoin (DILANTIN) 100 MG ER capsule Take 1 capsule (100 mg total) by mouth 3 (three) times daily.  90 capsule  11  . prochlorperazine (COMPAZINE) 10 MG tablet Take 1/2 hour prior to taking temodar daily  30 tablet  4  . temozolomide (TEMODAR) 140 MG capsule Take 1 capsule daily for the duration of radiation therapy. May take on an empty stomach or at bedtime to decrease nausea & vomiting.  30 capsule  2  . acetaminophen-codeine (TYLENOL #3) 300-30 MG per tablet Take 1 tablet by mouth every 6 (six) hours as needed for moderate pain.  60 tablet  0  . Alum & Mag Hydroxide-Simeth (MAGIC MOUTHWASH W/LIDOCAINE) SOLN Take 5 mLs by mouth 4 (four) times daily as needed for mouth pain.  500 mL  3  . dexamethasone (DECADRON) 4 MG tablet Take one tablet twice per day for 4 days, then one tablet once per day for 4 days.  12 tablet  0  . LORazepam (ATIVAN) 1 MG tablet Take 1 tablet (1 mg total) by mouth every 8 (eight) hours.  60 tablet  0  . sulfamethoxazole-trimethoprim (BACTRIM DS,SEPTRA DS) 800-160 MG per tablet Take 1 tablet by mouth 2 (two) times daily.  30 tablet  6   No current facility-administered medications for this visit.    SURGICAL HISTORY:  Past Surgical History  Procedure Laterality Date  .  Appendectomy    . Brain biopsy N/A 05/24/2013    Procedure: Craniotomy for open biopsy;  Surgeon: Winfield Cunas, MD;  Location: [facility];  Service: Neurosurgery;  Laterality: N/A;  Craniotomy for open biopsy    REVIEW OF SYSTEMS:  Pertinent items are noted in HPI.     PHYSICAL EXAMINATION: Blood pressure 121/86, pulse 93, temperature 97.6 F (36.4 C), temperature source Oral, resp. rate 18, height 5\' 11"  (1.803 m), weight 181 lb 4.8 oz (82.237 kg). Body mass index is 25.3 kg/(m^2). ECOG PERFORMANCE STATUS: 1 -  Symptomatic but completely ambulatory   General appearance: alert, cooperative and appears stated age Resp: clear to auscultation bilaterally Back: symmetric, no curvature. ROM normal. No CVA tenderness. Cardio: regular rate and rhythm GI: soft, non-tender; bowel sounds normal; no masses,  no organomegaly Extremities: extremities normal, atraumatic, no cyanosis or edema Neurologic: Grossly normal   LABORATORY DATA: Lab Results  Component Value Date   WBC 11.2* 07/11/2013   HGB 16.3 07/11/2013   HCT 47.9 07/11/2013   MCV 89.1 07/11/2013   PLT 276 07/11/2013      Chemistry      Component Value Date/Time   NA 136 07/11/2013 1452   NA 141 05/21/2013 0510   K 4.3 07/11/2013 1452   K 4.0 05/21/2013 0510   CL 105 05/21/2013 0510   CO2 21* 07/11/2013 1452   CO2 24 05/21/2013 0510   BUN 11.9 07/11/2013 1452   BUN 14 05/21/2013 0510   CREATININE 0.9 07/11/2013 1452   CREATININE 0.80 05/21/2013 0510      Component Value Date/Time   CALCIUM 9.5 07/11/2013 1452   CALCIUM 8.9 05/21/2013 0510   ALKPHOS 111 07/11/2013 1452   ALKPHOS 89 05/18/2013 1908   AST 20 07/11/2013 1452   AST 24 05/18/2013 1908   ALT 30 07/11/2013 1452   ALT 28 05/18/2013 1908   BILITOT 0.27 07/11/2013 1452   BILITOT 0.2* 05/18/2013 1908       RADIOGRAPHIC STUDIES:  No results found.  ASSESSMENT/PLAN: 48 year old gentleman with  #1 low-grade astrocytoma status post right frontal biopsy in January 22. Pathology revealed low-grade astrocytoma. He is currently receiving radiation therapy with concurrent Temodar at 75 mg per meter squared. He is tolerating it well without any problems.  #2 patient is on Bactrim DS on a daily basis.  #3 for anti-emetics he does have Zofran as needed and prior to taking the Temodar. This is to help prevent any vomiting or nausea he may experience.  #4 history of hepatitis and diabetes. This is managed by his primary care physician.   All questions were answered. The patient knows to call the  clinic with any problems, questions or concerns. We can certainly see the patient much sooner if necessary.  I spent 15 minutes counseling the patient face to face. The total time spent in the appointment was 25 minutes.    Marcy Panning, MD Medical/Oncology [facility] [phone] (beeper) [phone] (Office)

## 2013-07-16 ENCOUNTER — Ambulatory Visit
Admission: RE | Admit: 2013-07-16 | Discharge: 2013-07-16 | Disposition: A | Discharge status: Home / Self Care | Payer: No Typology Code available for payment source | Source: Ambulatory Visit | Attending: Radiation Oncology | Admitting: Radiation Oncology

## 2013-07-16 ENCOUNTER — Encounter: Payer: Self-pay | Admitting: Radiation Oncology

## 2013-07-16 VITALS — BP 120/89 | HR 90 | Resp 16 | Wt 182.8 lb

## 2013-07-16 DIAGNOSIS — C711 Malignant neoplasm of frontal lobe: Secondary | ICD-10-CM

## 2013-07-16 NOTE — Progress Notes (Signed)
Patient released by Dr. Christella Noa to resume radiation therapy. Transverse frontal incision well approximated without redness or edema. Pea size raised area noted middle of incision. No taking decadron at this time. Reports frontal headaches when he wakes up each morning. Reports he completed his penicillin script two days ago. Speech fluid. Reports fatigue.

## 2013-07-16 NOTE — Progress Notes (Signed)
  Radiation Oncology         [phone]) [phone] ________________________________  Name: Jonathan Barker MRN: [id number]  Date: 07/16/2013  DOB: May 11, 1965  Weekly Radiation Therapy Management  Current Dose: 10.8 Gy     Planned Dose:  54 Gy  Narrative . . . . . . . . The patient presents for routine under treatment assessment.  He restarted today after a break for wound breakdown/infection at the craniotomy incision.  This has been treated by Dr. Christella Noa.                                   The patient is without complaint.                                 Set-up films were reviewed.                                 The chart was checked. Physical Findings. . .  weight is 182 lb 12.8 oz (82.918 kg). His blood pressure is 120/89 and his pulse is 90. His respiration is 16. . Weight essentially stable.  No significant changes. Impression . . . . . . . The patient is tolerating radiation. Plan . . . . . . . . . . . . Continue treatment as planned.  May increase total radiation dose to offset the interruption.  ________________________________  Sheral Apley Tammi Klippel, M.D.

## 2013-07-17 ENCOUNTER — Ambulatory Visit
Admission: RE | Admit: 2013-07-17 | Discharge: 2013-07-17 | Disposition: A | Discharge status: Home / Self Care | Payer: No Typology Code available for payment source | Source: Ambulatory Visit | Attending: Radiation Oncology | Admitting: Radiation Oncology

## 2013-07-18 ENCOUNTER — Ambulatory Visit: Payer: MEDICAID

## 2013-07-19 ENCOUNTER — Encounter (INDEPENDENT_AMBULATORY_CARE_PROVIDER_SITE_OTHER): Payer: Self-pay

## 2013-07-19 ENCOUNTER — Ambulatory Visit (HOSPITAL_COMMUNITY): Payer: Self-pay | Admitting: Dentistry

## 2013-07-19 ENCOUNTER — Ambulatory Visit
Admission: RE | Admit: 2013-07-19 | Discharge: 2013-07-19 | Disposition: A | Discharge status: Home / Self Care | Payer: No Typology Code available for payment source | Source: Ambulatory Visit | Attending: Radiation Oncology | Admitting: Radiation Oncology

## 2013-07-19 ENCOUNTER — Encounter (HOSPITAL_COMMUNITY): Payer: Self-pay | Admitting: Dentistry

## 2013-07-19 VITALS — BP 122/81 | HR 80 | Temp 97.6°F

## 2013-07-19 DIAGNOSIS — K0820 Unspecified atrophy of edentulous alveolar ridge: Secondary | ICD-10-CM

## 2013-07-19 DIAGNOSIS — M264 Malocclusion, unspecified: Secondary | ICD-10-CM

## 2013-07-19 DIAGNOSIS — M278 Other specified diseases of jaws: Secondary | ICD-10-CM

## 2013-07-19 DIAGNOSIS — K083 Retained dental root: Secondary | ICD-10-CM

## 2013-07-19 DIAGNOSIS — K045 Chronic apical periodontitis: Secondary | ICD-10-CM

## 2013-07-19 DIAGNOSIS — K08109 Complete loss of teeth, unspecified cause, unspecified class: Secondary | ICD-10-CM

## 2013-07-19 DIAGNOSIS — C711 Malignant neoplasm of frontal lobe: Secondary | ICD-10-CM

## 2013-07-19 DIAGNOSIS — IMO0002 Reserved for concepts with insufficient information to code with codable children: Secondary | ICD-10-CM

## 2013-07-19 DIAGNOSIS — K029 Dental caries, unspecified: Secondary | ICD-10-CM

## 2013-07-19 DIAGNOSIS — K0530 Chronic periodontitis, unspecified: Secondary | ICD-10-CM

## 2013-07-19 DIAGNOSIS — K0889 Other specified disorders of teeth and supporting structures: Secondary | ICD-10-CM

## 2013-07-19 NOTE — Patient Instructions (Signed)
The patient will need extraction of remaining teeth with alveoloplasty and pre-prosthetic surgery as indicated in the operating room with general anesthesia. The patient will be scheduled as indicated after a discussion with Dr. Humphrey Rolls concerning the ability to proceed with dental procedures at this time. The patient is to call dental medicine or Dr. Humphrey Rolls if acute problems arise before then. Dr. Enrique Sack

## 2013-07-19 NOTE — Progress Notes (Signed)
DENTAL CONSULTATION  Date of Consultation:  07/19/2013 Patient Name:   Jonathan Barker Date of Birth:   01-18-1966 Medical Record Number: [id number]  VITALS: BP 122/81  Pulse 80  Temp(Src) 97.6 F (36.4 C) (Oral)   CHIEF COMPLAINT: The patient was referred for evaluation of poor dentition.  HPI: Jonathan Barker is a 48 year old male recently diagnosed with astrocytoma of the frontal lobe. The patient currently undergoing chemoradiation therapy with Dr. Humphrey Rolls and Dr. Tammi Klippel respectively. A dental consultation was requested to rule out dental infection that may affect the patient's systemic health while undergoing active chemoradiation therapy.  The patient currently denies acute toothache, swellings, or abscesses. Patient knows that he has bad teeth and that they" all need to come out".  The patient has not seen a dentist for over 25 years.  At that time, the patient had a tooth extracted by [facility].  Patient indicates that he was" not numb" when they pulled the tooth.  The patient has an pulling his own teeth since then. The patient does not have any partial dentures.   PROBLEM LIST: Patient Active Problem List   Diagnosis Date Noted  . Coagulase-negative staphylococcal infection 06/29/2013  . Astrocytoma of frontal lobe 05/31/2013  . Seizure 05/19/2013  . Tobacco abuse 09/09/2011  . Diabetes type 2, uncontrolled 08/09/2011  . HLD (hyperlipidemia) 08/09/2011  . GERD (gastroesophageal reflux disease) 08/09/2011    PMH: Past Medical History  Diagnosis Date  . Shortness of breath   . Hepatitis   . DM (diabetes mellitus) 08/09/2011    A1c at diagnosis = 12.4  . GERD (gastroesophageal reflux disease) 08/09/2011  . Seizure 05/19/2013  . Astrocytoma of frontal lobe 05/31/2013    PSH: Past Surgical History  Procedure Laterality Date  . Appendectomy    . Brain biopsy N/A 05/24/2013    Procedure: Craniotomy for open biopsy;  Surgeon: Winfield Cunas, MD;  Location: [place]  [facility];  Service: Neurosurgery;  Laterality: N/A;  Craniotomy for open biopsy    ALLERGIES: No Known Allergies  MEDICATIONS: Current Outpatient Prescriptions  Medication Sig Dispense Refill  . metFORMIN (GLUCOPHAGE) 1000 MG tablet Take 1 tablet (1,000 mg total) by mouth daily.  90 tablet  1  . oxyCODONE-acetaminophen (PERCOCET/ROXICET) 5-325 MG per tablet Take 1-2 tablets by mouth every 6 (six) hours as needed for severe pain.  90 tablet  0  . phenytoin (DILANTIN) 100 MG ER capsule Take 1 capsule (100 mg total) by mouth 3 (three) times daily.  90 capsule  11  . prochlorperazine (COMPAZINE) 10 MG tablet Take 1/2 hour prior to taking temodar daily  30 tablet  4  . sulfamethoxazole-trimethoprim (BACTRIM DS,SEPTRA DS) 800-160 MG per tablet Take 1 tablet by mouth 2 (two) times daily.  30 tablet  6  . temozolomide (TEMODAR) 140 MG capsule Take 1 capsule daily for the duration of radiation therapy. May take on an empty stomach or at bedtime to decrease nausea & vomiting.  30 capsule  2  . Alum & Mag Hydroxide-Simeth (MAGIC MOUTHWASH W/LIDOCAINE) SOLN Take 5 mLs by mouth 4 (four) times daily as needed for mouth pain.  500 mL  3  . LORazepam (ATIVAN) 1 MG tablet Take 1 tablet (1 mg total) by mouth every 8 (eight) hours.  60 tablet  0   No current facility-administered medications for this visit.    LABS: Lab Results  Component Value Date   WBC 11.2* 07/11/2013   HGB 16.3 07/11/2013   HCT  47.9 07/11/2013   MCV 89.1 07/11/2013   PLT 276 07/11/2013      Component Value Date/Time   NA 136 07/11/2013 1452   NA 141 05/21/2013 0510   K 4.3 07/11/2013 1452   K 4.0 05/21/2013 0510   CL 105 05/21/2013 0510   CO2 21* 07/11/2013 1452   CO2 24 05/21/2013 0510   GLUCOSE 231* 07/11/2013 1452   GLUCOSE 206* 05/21/2013 0510   BUN 11.9 07/11/2013 1452   BUN 14 05/21/2013 0510   CREATININE 0.9 07/11/2013 1452   CREATININE 0.80 05/21/2013 0510   CALCIUM 9.5 07/11/2013 1452   CALCIUM 8.9 05/21/2013 0510   GFRNONAA  >90 05/21/2013 0510   GFRAA >90 05/21/2013 0510   Lab Results  Component Value Date   INR 1.04 05/19/2013   No results found for this basename: PTT    SOCIAL HISTORY: History   Social History  . Marital Status: Significant Other    Spouse Name: N/A    Number of Children: 2  . Years of Education: N/A   Occupational History  .     Social History Main Topics  . Smoking status: Current Every Day Smoker -- 2.00 packs/day for 33 years    Types: Cigarettes  . Smokeless tobacco: Never Used     Comment: Trying to quit. Down to 1 ppd  . Alcohol Use: Yes     Comment: social  . Drug Use: Yes    Special: Marijuana     Comment: Marijuana daily  . Sexual Activity: Not on file   Other Topics Concern  . Not on file   Social History Narrative   [place] the [place]. He does not work now.     FAMILY HISTORY: Family History  Problem Relation Age of Onset  . Breast cancer Mother   . Diabetes Father   . Heart disease Father   . Diabetes Paternal Uncle   . Diabetes Brother      REVIEW OF SYSTEMS: Reviewed with patient and included in dental record.  DENTAL HISTORY: CHIEF COMPLAINT: The patient was referred for evaluation of poor dentition.  HPI: Jonathan Barker is a 48 year old male recently diagnosed with astrocytoma of the frontal lobe. The patient currently undergoing chemoradiation therapy with Dr. Humphrey Rolls and Dr. Tammi Klippel respectively. A dental consultation was requested to rule out dental infection that may affect the patient's systemic health while undergoing active chemoradiation therapy.  The patient currently denies acute toothache, swellings, or abscesses. Patient knows that he has bad teeth and that they" all need to come out".  The patient has not seen a dentist for over 25 years.  At that time, the patient had a tooth extracted by [facility].  Patient indicates that he was" not numb" when they pulled the tooth.  The patient has an pulling his own  teeth since then. The patient does not have any partial dentures.  DENTAL EXAMINATION:  GENERAL: The patient is a well-developed, well-nourished male in no acute distress. HEAD AND NECK: The patient has loss of hair and a scar on his right frontal lobe area consistent with previous frontal lobe biopsy.   There is no palpable submandibular lymphadenopathy. The patient denies acute TMJ symptoms. The patient has an extensive beard and mustache that will need to removed prior to oral surgery. INTRAORAL EXAM: Patient has normal saliva. There is no evidence of intraoral abscess formation today. There is atrophy of the edentulous alveolar ridges. The patient has buccal exostoses in  the area of tooth numbers 4 through 6 and 12 through 14. DENTITION: The patient is missing tooth numbers 1 through 9, 12 through 16, 17, 24, 25, and 32.  The patient has retained roots in the area of tooth numbers 10, 11, 18, 19, 20, 21, 22, 28, 29, 30, and 31. PERIODONTAL: The patient has chronic periodontitis with plaque and calculus accumulations, generalized gingival recession, and generalized tooth mobility. There is moderate to severe bone loss noted. DENTAL CARIES/SUBOPTIMAL RESTORATIONS: Patient has rampant dental caries affecting all remaining teeth in his mouth. ENDODONTIC: The patient currently denies acute pulpitis symptoms. The patient has multiple areas of periapical pathology and radiolucency associated with the retained root segments. CROWN AND BRIDGE: There are no crown or bridge restorations noted. PROSTHODONTIC: The patient has no partial dentures. OCCLUSION: The patient has a poor occlusal scheme secondary to multiple missing teeth, supra-eruption and drifting of the unopposed teeth into the edentulous areas, and lack of replacement of missing teeth with dental prostheses.  RADIOGRAPHIC INTERPRETATION: An orthopantogram was taken and supplemented with 6 periapical radiographs. There are multiple missing  teeth. There are multiple retained root segments. There are multiple areas of periapical pathology and radiolucency. There are extensive dental caries noted. There is moderate to severe bone loss noted. There is pneumatization of the maxillary sinuses. There is extensive periapical radiolucency associated retained root tip numbers 10 and 11.   ASSESSMENTS: 1. Astrocytoma of the frontal lobe with current active chemoradiation therapy. 2. Chronic apical periodontitis 3. Rampant dental caries 4. Multiple retained root segments 5. Chronic periodontitis with bone loss 6. Generalized gingival recession 7. Generalized tooth mobility 8. Multiple missing teeth 9. Atrophy of the edentulous alveolar ridges 10. No history of partial dentures 11. Maxillary buccal exostoses 12. History of oral neglect 13. Current chemoradiation therapy with the risk for bleeding and infection with invasive dental procedures   PLAN/RECOMMENDATIONS: 1. I discussed the risks, benefits, and complications of various treatment options with the patient in relationship to his medical and dental conditions, current chemoradiation therapy and chemoradiation therapy side effects. We discussed various treatment options to include no treatment, multiple extractions with alveoloplasty, pre-prosthetic surgery as indicated, periodontal therapy, dental restorations, root canal therapy, crown and bridge therapy, implant therapy, and replacement of missing teeth as indicated. The patient currently wishes to proceed with extraction of remaining teeth with alveoloplasty and pre-prosthetic surgery as indicated in the operating room general anesthesia. This will be scheduled when patient is considered medically stable to proceed with invasive dental procedures as planned. The patient will then follow up with the dentist of his choice for fabrication of upper and lower complete denture after adequate healing. Patient is aware of limited prognosis for  successful denture rehabilitation due to the extensive atrophy of edentulous alveolar ridges.   2. Discussion of findings with medical team and coordination of future medical and dental care as needed.  I spent 75 minutes face to face with patient and more than 50% of time was spent in counseling and /or coordination of care.   Lenn Cal, DDS

## 2013-07-20 ENCOUNTER — Ambulatory Visit
Admission: RE | Admit: 2013-07-20 | Discharge: 2013-07-20 | Disposition: A | Discharge status: Home / Self Care | Payer: No Typology Code available for payment source | Source: Ambulatory Visit | Attending: Radiation Oncology | Admitting: Radiation Oncology

## 2013-07-20 ENCOUNTER — Telehealth: Payer: Self-pay | Admitting: Radiation Oncology

## 2013-07-20 ENCOUNTER — Encounter: Payer: Self-pay | Admitting: Radiation Oncology

## 2013-07-20 VITALS — BP 133/80 | HR 95 | Resp 16 | Wt 182.5 lb

## 2013-07-20 DIAGNOSIS — C711 Malignant neoplasm of frontal lobe: Secondary | ICD-10-CM

## 2013-07-20 MED ORDER — PHENYTOIN SODIUM EXTENDED 100 MG PO CAPS: 100.0000 mg | ORAL_CAPSULE | Freq: Three times a day (TID) | ORAL | Status: DC

## 2013-07-20 MED ORDER — AMOXICILLIN-POT CLAVULANATE 875-125 MG PO TABS: 1.0000 | ORAL_TABLET | Freq: Two times a day (BID) | ORAL | Status: DC

## 2013-07-20 NOTE — Progress Notes (Addendum)
Reports headaches at night. Reports pressure behind right eye following treatment. Reports only occasional ringing in his ears. Denies nausea, vomiting or dizziness. Reports a good appetite. Weight stable. Requesting refill of dilantin. Incision still not healed completely despite penicillin script complete. Incision without erythema. Daughter applies neosporin daily.

## 2013-07-20 NOTE — Telephone Encounter (Signed)
Called in phenytoin 100 mg er, 1 capsule by mouth three times per day, qty 90, with 11 refills to [facility]. Patient informed this script would be called in during PUT visit. Patient plans to pick up script later day.

## 2013-07-20 NOTE — Progress Notes (Signed)
  Radiation Oncology         [phone]) [phone] ________________________________  Name: Jonathan Barker MRN: [id number]  Date: 07/20/2013  DOB: 1965/07/27  Weekly Radiation Therapy Management  Current Dose: 16.2 Gy     Planned Dose:  54 Gy  Narrative . . . . . . . . The patient presents for routine under treatment assessment.                                   The patient is without complaint.                                 Set-up films were reviewed.                                 The chart was checked. Physical Findings. . .  weight is 182 lb 8 oz (82.781 kg). His blood pressure is 133/80 and his pulse is 95. His respiration is 16 and oxygen saturation is 99%. . Weight essentially stable.  No significant changes. Impression . . . . . . . The patient is tolerating radiation. Plan . . . . . . . . . . . . Continue treatment as planned.  Refilled dilatin  ________________________________  Sheral Apley Tammi Klippel, M.D.

## 2013-07-22 NOTE — Telephone Encounter (Signed)
It appears I electronically sent the prescription for this and Augmentin to [facility] rather than [facility].  Thanks for calling it in.  MM

## 2013-07-23 ENCOUNTER — Ambulatory Visit
Admission: RE | Admit: 2013-07-23 | Discharge: 2013-07-23 | Disposition: A | Discharge status: Home / Self Care | Payer: No Typology Code available for payment source | Source: Ambulatory Visit | Attending: Radiation Oncology | Admitting: Radiation Oncology

## 2013-07-23 ENCOUNTER — Telehealth: Payer: Self-pay | Admitting: Radiation Oncology

## 2013-07-23 NOTE — Telephone Encounter (Signed)
Spoke with patient's daughter. Questioned if her father had picked up his dilantin and Augmentin from the pharmacy yet. She reported that he had not but, they planned to today. Phoned Aaron Edelman at [facility] to confirm he has both scripts ready. Aaron Edelman reports that the dilantin is from a different manufacturer than the one [facility] provided. Explained this was OK because patient would not be able to afford it without cancer funds which are only available at [facility]. In addition, verbalized Augmentin script to Aaron Edelman to fill. Aaron Edelman reports the patient is now waiting at the pharmacy to pick up these prescriptions.

## 2013-07-24 ENCOUNTER — Ambulatory Visit
Admission: RE | Admit: 2013-07-24 | Discharge: 2013-07-24 | Disposition: A | Discharge status: Home / Self Care | Payer: No Typology Code available for payment source | Source: Ambulatory Visit | Attending: Radiation Oncology | Admitting: Radiation Oncology

## 2013-07-25 ENCOUNTER — Ambulatory Visit
Admission: RE | Admit: 2013-07-25 | Discharge: 2013-07-25 | Disposition: A | Discharge status: Home / Self Care | Payer: No Typology Code available for payment source | Source: Ambulatory Visit | Attending: Radiation Oncology | Admitting: Radiation Oncology

## 2013-07-26 ENCOUNTER — Ambulatory Visit: Payer: MEDICAID

## 2013-07-26 ENCOUNTER — Encounter: Payer: Self-pay | Admitting: Radiation Oncology

## 2013-07-26 ENCOUNTER — Telehealth: Payer: Self-pay | Admitting: Radiation Oncology

## 2013-07-26 NOTE — Progress Notes (Signed)
  Radiation Oncology         [phone]) [phone] ________________________________  Name: Jonathan Barker MRN: [id number]  Date: 07/27/2013  DOB: January 23, 1966  Weekly Radiation Therapy Management  Current Dose: 23.4 Gy     Planned Dose:  54 Gy  Narrative . . . . . . . . The patient presents for routine under treatment assessment.                                   The patient is without complaint.                                 Set-up films were reviewed.                                 The chart was checked. Physical Findings. . .  weight is 183 lb 12.8 oz (83.371 kg). His blood pressure is 125/82 and his pulse is 90. His respiration is 16. . Weight essentially stable.  No significant changes. Impression . . . . . . . The patient is tolerating radiation. Plan . . . . . . . . . . . . Continue treatment as planned.  ________________________________  Sheral Apley. Tammi Klippel, M.D.

## 2013-07-26 NOTE — Telephone Encounter (Signed)
Patient cancelled treatment appointment. Called patient's daughter. No answer. Left message inquiring about patient status. Requested return call.

## 2013-07-27 ENCOUNTER — Ambulatory Visit: Payer: MEDICAID

## 2013-07-27 ENCOUNTER — Ambulatory Visit
Admission: RE | Admit: 2013-07-27 | Discharge: 2013-07-27 | Disposition: A | Discharge status: Home / Self Care | Payer: No Typology Code available for payment source | Source: Ambulatory Visit | Attending: Radiation Oncology | Admitting: Radiation Oncology

## 2013-07-27 ENCOUNTER — Encounter: Payer: Self-pay | Admitting: Radiation Oncology

## 2013-07-27 ENCOUNTER — Ambulatory Visit
Admission: RE | Admit: 2013-07-27 | Discharge: 2013-07-27 | Disposition: A | Discharge status: Home / Self Care | Payer: MEDICAID | Source: Ambulatory Visit | Attending: Radiation Oncology | Admitting: Radiation Oncology

## 2013-07-27 VITALS — BP 125/82 | HR 90 | Resp 16 | Wt 183.8 lb

## 2013-07-27 DIAGNOSIS — C711 Malignant neoplasm of frontal lobe: Secondary | ICD-10-CM

## 2013-07-27 MED ORDER — OXYCODONE-ACETAMINOPHEN 5-325 MG PO TABS: 1.0000 | ORAL_TABLET | Freq: Four times a day (QID) | ORAL | Status: DC | PRN

## 2013-07-27 NOTE — Progress Notes (Signed)
Reports headaches at night. Reports pressure behind right eye following treatment. Reports only occasional ringing in his ears. Denies nausea, vomiting or dizziness. Reports a good appetite. Weight stable. Incision still not healed completely. Incision without erythema. Daughter applies neosporin daily.

## 2013-07-30 ENCOUNTER — Ambulatory Visit
Admission: RE | Admit: 2013-07-30 | Discharge: 2013-07-30 | Disposition: A | Discharge status: Home / Self Care | Payer: No Typology Code available for payment source | Source: Ambulatory Visit | Attending: Radiation Oncology | Admitting: Radiation Oncology

## 2013-07-30 ENCOUNTER — Ambulatory Visit: Payer: MEDICAID

## 2013-07-31 ENCOUNTER — Ambulatory Visit: Payer: MEDICAID

## 2013-07-31 ENCOUNTER — Ambulatory Visit
Admission: RE | Admit: 2013-07-31 | Discharge: 2013-07-31 | Disposition: A | Discharge status: Home / Self Care | Payer: No Typology Code available for payment source | Source: Ambulatory Visit | Attending: Radiation Oncology | Admitting: Radiation Oncology

## 2013-08-01 ENCOUNTER — Ambulatory Visit: Payer: MEDICAID

## 2013-08-01 ENCOUNTER — Telehealth: Payer: Self-pay | Admitting: Radiation Oncology

## 2013-08-01 NOTE — Telephone Encounter (Signed)
Informed by therapist, Faith, that the patient cancelled treatment appointment for today "for personal reasons." Phoned daughter who reports "he is fine he just didn't have a ride." She states, "he plans to come for treatment tomorrow."

## 2013-08-02 ENCOUNTER — Ambulatory Visit: Payer: MEDICAID

## 2013-08-02 ENCOUNTER — Ambulatory Visit
Admission: RE | Admit: 2013-08-02 | Discharge: 2013-08-02 | Disposition: A | Discharge status: Home / Self Care | Payer: No Typology Code available for payment source | Source: Ambulatory Visit | Attending: Radiation Oncology | Admitting: Radiation Oncology

## 2013-08-03 ENCOUNTER — Ambulatory Visit: Admission: RE | Admit: 2013-08-03 | Payer: MEDICAID | Source: Ambulatory Visit | Admitting: Radiation Oncology

## 2013-08-03 ENCOUNTER — Ambulatory Visit
Admission: RE | Admit: 2013-08-03 | Discharge: 2013-08-03 | Disposition: A | Discharge status: Home / Self Care | Payer: No Typology Code available for payment source | Source: Ambulatory Visit | Attending: Radiation Oncology | Admitting: Radiation Oncology

## 2013-08-03 ENCOUNTER — Ambulatory Visit: Payer: MEDICAID

## 2013-08-05 ENCOUNTER — Ambulatory Visit: Payer: MEDICAID

## 2013-08-06 ENCOUNTER — Ambulatory Visit: Payer: MEDICAID

## 2013-08-06 ENCOUNTER — Ambulatory Visit
Admission: RE | Admit: 2013-08-06 | Discharge: 2013-08-06 | Disposition: A | Discharge status: Home / Self Care | Payer: No Typology Code available for payment source | Source: Ambulatory Visit | Attending: Radiation Oncology | Admitting: Radiation Oncology

## 2013-08-07 ENCOUNTER — Ambulatory Visit
Admission: RE | Admit: 2013-08-07 | Discharge: 2013-08-07 | Disposition: A | Discharge status: Home / Self Care | Payer: No Typology Code available for payment source | Source: Ambulatory Visit | Attending: Radiation Oncology | Admitting: Radiation Oncology

## 2013-08-07 ENCOUNTER — Ambulatory Visit: Payer: MEDICAID

## 2013-08-08 ENCOUNTER — Ambulatory Visit: Payer: MEDICAID

## 2013-08-09 ENCOUNTER — Ambulatory Visit: Payer: MEDICAID

## 2013-08-09 ENCOUNTER — Encounter: Payer: Self-pay | Admitting: Radiation Oncology

## 2013-08-09 ENCOUNTER — Ambulatory Visit
Admission: RE | Admit: 2013-08-09 | Discharge: 2013-08-09 | Disposition: A | Discharge status: Home / Self Care | Payer: No Typology Code available for payment source | Source: Ambulatory Visit | Attending: Radiation Oncology | Admitting: Radiation Oncology

## 2013-08-09 ENCOUNTER — Ambulatory Visit
Admission: RE | Admit: 2013-08-09 | Discharge: 2013-08-09 | Disposition: A | Discharge status: Home / Self Care | Payer: MEDICAID | Source: Ambulatory Visit | Attending: Radiation Oncology | Admitting: Radiation Oncology

## 2013-08-09 VITALS — Resp 16 | Wt 182.7 lb

## 2013-08-09 DIAGNOSIS — C711 Malignant neoplasm of frontal lobe: Secondary | ICD-10-CM

## 2013-08-09 NOTE — Progress Notes (Signed)
  Radiation Oncology         [phone]) [phone] ________________________________  Name: Jonathan Barker MRN: [id number]  Date: 08/09/2013  DOB: December 17, 1965  Weekly Radiation Therapy Management  Current Dose: 36 Gy     Planned Dose:  54 Gy  Narrative . . . . . . . . The patient presents for routine under treatment assessment.                                   The patient is without complaint.                                 Set-up films were reviewed.                                 The chart was checked. Physical Findings. . .  weight is 182 lb 11.2 oz (82.872 kg). His respiration is 16. . Weight essentially stable.  No significant changes. Impression . . . . . . . The patient is tolerating radiation. Plan . . . . . . . . . . . . Continue treatment as planned.  ________________________________  Sheral Apley. Tammi Klippel, M.D.

## 2013-08-09 NOTE — Progress Notes (Signed)
Reports headaches at night. Reports pressure behind right eye following treatment. Denies nausea, vomiting or dizziness. Reports a good appetite. Weight stable. Incision still not healed completely but, has improved greatly. No Erythema or edema at incision. Hyperpigmentation without desquamation noted forehead. Provided patient with biafine and directed upon use.

## 2013-08-10 ENCOUNTER — Ambulatory Visit: Payer: MEDICAID

## 2013-08-10 ENCOUNTER — Ambulatory Visit
Admission: RE | Admit: 2013-08-10 | Discharge: 2013-08-10 | Disposition: A | Discharge status: Home / Self Care | Payer: No Typology Code available for payment source | Source: Ambulatory Visit | Attending: Radiation Oncology | Admitting: Radiation Oncology

## 2013-08-10 MED ORDER — OXYCODONE-ACETAMINOPHEN 5-325 MG PO TABS: 1.0000 | ORAL_TABLET | Freq: Four times a day (QID) | ORAL | Status: DC | PRN

## 2013-08-10 NOTE — Addendum Note (Signed)
Encounter addended by: Lora Paula, MD on: 08/10/2013 11:58 AM<BR>     Documentation filed: Orders

## 2013-08-13 ENCOUNTER — Ambulatory Visit
Admission: RE | Admit: 2013-08-13 | Discharge: 2013-08-13 | Disposition: A | Discharge status: Home / Self Care | Payer: No Typology Code available for payment source | Source: Ambulatory Visit | Attending: Radiation Oncology | Admitting: Radiation Oncology

## 2013-08-14 ENCOUNTER — Ambulatory Visit
Admission: RE | Admit: 2013-08-14 | Discharge: 2013-08-14 | Disposition: A | Discharge status: Home / Self Care | Payer: No Typology Code available for payment source | Source: Ambulatory Visit | Attending: Radiation Oncology | Admitting: Radiation Oncology

## 2013-08-15 ENCOUNTER — Encounter: Payer: Self-pay | Admitting: Radiation Oncology

## 2013-08-15 ENCOUNTER — Ambulatory Visit
Admission: RE | Admit: 2013-08-15 | Discharge: 2013-08-15 | Disposition: A | Discharge status: Home / Self Care | Payer: No Typology Code available for payment source | Source: Ambulatory Visit | Attending: Radiation Oncology | Admitting: Radiation Oncology

## 2013-08-16 ENCOUNTER — Ambulatory Visit: Payer: MEDICAID

## 2013-08-16 ENCOUNTER — Ambulatory Visit
Admission: RE | Admit: 2013-08-16 | Discharge: 2013-08-16 | Disposition: A | Discharge status: Home / Self Care | Payer: No Typology Code available for payment source | Source: Ambulatory Visit | Attending: Radiation Oncology | Admitting: Radiation Oncology

## 2013-08-16 DIAGNOSIS — C711 Malignant neoplasm of frontal lobe: Secondary | ICD-10-CM

## 2013-08-17 ENCOUNTER — Telehealth: Payer: Self-pay | Admitting: Radiation Oncology

## 2013-08-17 ENCOUNTER — Encounter: Payer: Self-pay | Admitting: Radiation Oncology

## 2013-08-17 ENCOUNTER — Ambulatory Visit
Admission: RE | Admit: 2013-08-17 | Discharge: 2013-08-17 | Disposition: A | Discharge status: Home / Self Care | Payer: No Typology Code available for payment source | Source: Ambulatory Visit | Attending: Radiation Oncology | Admitting: Radiation Oncology

## 2013-08-17 VITALS — BP 115/82 | HR 80 | Temp 97.5°F | Resp 20 | Wt 183.4 lb

## 2013-08-17 DIAGNOSIS — C711 Malignant neoplasm of frontal lobe: Secondary | ICD-10-CM

## 2013-08-17 NOTE — Telephone Encounter (Signed)
Informed patient did not show for treatment yesterday. Phoned patient' daughter, Overton Mam. No answer. Left message inquiring about her father's status and reminding of treatment appointment time today. Advised to call with needs.

## 2013-08-17 NOTE — Progress Notes (Signed)
   Department of Radiation Oncology  Phone:  [phone] Fax:        [phone]  Weekly Treatment Note    Name: Jonathan Barker Date: 08/17/2013 MRN: [id number] DOB: Apr 13, 1966   Current dose: 45 Gy  Current fraction: 25   MEDICATIONS: Current Outpatient Prescriptions  Medication Sig Dispense Refill  . Alum & Mag Hydroxide-Simeth (MAGIC MOUTHWASH W/LIDOCAINE) SOLN Take 5 mLs by mouth 4 (four) times daily as needed for mouth pain.  500 mL  3  . LORazepam (ATIVAN) 1 MG tablet Take 1 tablet (1 mg total) by mouth every 8 (eight) hours.  60 tablet  0  . metFORMIN (GLUCOPHAGE) 1000 MG tablet Take 1 tablet (1,000 mg total) by mouth daily.  90 tablet  1  . oxyCODONE-acetaminophen (PERCOCET/ROXICET) 5-325 MG per tablet Take 1-2 tablets by mouth every 6 (six) hours as needed for severe pain.  90 tablet  0  . phenytoin (DILANTIN) 100 MG ER capsule Take 1 capsule (100 mg total) by mouth 3 (three) times daily.  90 capsule  11  . prochlorperazine (COMPAZINE) 10 MG tablet Take 1/2 hour prior to taking temodar daily  30 tablet  4  . sulfamethoxazole-trimethoprim (BACTRIM DS,SEPTRA DS) 800-160 MG per tablet Take 1 tablet by mouth 2 (two) times daily.  30 tablet  6  . temozolomide (TEMODAR) 140 MG capsule Take 1 capsule daily for the duration of radiation therapy. May take on an empty stomach or at bedtime to decrease nausea & vomiting.  30 capsule  2   No current facility-administered medications for this encounter.     ALLERGIES: Review of patient's allergies indicates no known allergies.   LABORATORY DATA:  Lab Results  Component Value Date   WBC 11.2* 07/11/2013   HGB 16.3 07/11/2013   HCT 47.9 07/11/2013   MCV 89.1 07/11/2013   PLT 276 07/11/2013   Lab Results  Component Value Date   NA 136 07/11/2013   K 4.3 07/11/2013   CL 105 05/21/2013   CO2 21* 07/11/2013   Lab Results  Component Value Date   ALT 30 07/11/2013   AST 20 07/11/2013   ALKPHOS 111 07/11/2013   BILITOT 0.27  07/11/2013     NARRATIVE: [name] was seen today for weekly treatment management. The chart was checked and the patient's films were reviewed. She patient states he is doing well. He denies any new difficulties. The one exception is is nausea but this was well-controlled with his current nausea medication. Her skin is feeling much better after he began using skin cream. He is not on Decadron. He continues to take Temodar.  PHYSICAL EXAMINATION: weight is 183 lb 6.4 oz (83.19 kg). His oral temperature is 97.5 F (36.4 C). His blood pressure is 115/82 and his pulse is 80. His respiration is 20.      the patient's skin shows some erythema without any significant desquamation currently  ASSESSMENT: The patient is doing satisfactorily with treatment.  PLAN: We will continue with the patient's radiation treatment as planned.

## 2013-08-17 NOTE — Progress Notes (Signed)
Pt reports pressure behind right eye, moderate headache over his right eye and temple. He takes Percocet prn 2-3 x daily w/good relief. He c/o fatigue, weakness. He is taking Temodar, states he had nausea last night at bedtime, first episode. Pt denies loss of appetite, other problems today. He missed treatment yesterday due to lack of transportation.

## 2013-08-20 ENCOUNTER — Ambulatory Visit
Admission: RE | Admit: 2013-08-20 | Discharge: 2013-08-20 | Disposition: A | Discharge status: Home / Self Care | Payer: No Typology Code available for payment source | Source: Ambulatory Visit | Attending: Radiation Oncology | Admitting: Radiation Oncology

## 2013-08-20 ENCOUNTER — Ambulatory Visit: Payer: MEDICAID

## 2013-08-21 ENCOUNTER — Ambulatory Visit: Payer: MEDICAID

## 2013-08-21 ENCOUNTER — Ambulatory Visit
Admission: RE | Admit: 2013-08-21 | Discharge: 2013-08-21 | Disposition: A | Discharge status: Home / Self Care | Payer: No Typology Code available for payment source | Source: Ambulatory Visit | Attending: Radiation Oncology | Admitting: Radiation Oncology

## 2013-08-22 ENCOUNTER — Ambulatory Visit
Admission: RE | Admit: 2013-08-22 | Discharge: 2013-08-22 | Disposition: A | Discharge status: Home / Self Care | Payer: No Typology Code available for payment source | Source: Ambulatory Visit | Attending: Radiation Oncology | Admitting: Radiation Oncology

## 2013-08-22 ENCOUNTER — Telehealth: Payer: Self-pay | Admitting: Oncology

## 2013-08-22 ENCOUNTER — Ambulatory Visit: Payer: MEDICAID

## 2013-08-22 NOTE — Telephone Encounter (Signed)
, °

## 2013-08-23 ENCOUNTER — Encounter: Payer: Self-pay | Admitting: Radiation Oncology

## 2013-08-23 ENCOUNTER — Ambulatory Visit
Admission: RE | Admit: 2013-08-23 | Discharge: 2013-08-23 | Disposition: A | Discharge status: Home / Self Care | Payer: No Typology Code available for payment source | Source: Ambulatory Visit | Attending: Radiation Oncology | Admitting: Radiation Oncology

## 2013-08-23 ENCOUNTER — Ambulatory Visit: Payer: MEDICAID

## 2013-08-23 VITALS — BP 121/91 | HR 88 | Resp 16 | Wt 185.3 lb

## 2013-08-23 DIAGNOSIS — C711 Malignant neoplasm of frontal lobe: Secondary | ICD-10-CM

## 2013-08-23 MED ORDER — OXYCODONE-ACETAMINOPHEN 5-325 MG PO TABS: 1.0000 | ORAL_TABLET | Freq: Four times a day (QID) | ORAL | Status: DC | PRN

## 2013-08-23 NOTE — Progress Notes (Signed)
  Radiation Oncology         [phone]) [phone] ________________________________  Name: Jonathan Barker MRN: [id number]  Date: 08/23/2013  DOB: 1965/09/19    Weekly Radiation Therapy Management  Current Dose: 52.2 Gy     Planned Dose:  54 Gy  Narrative . . . . . . . . The patient presents for routine under treatment assessment.                                  Reports headache 7 on a scale of 0-10. Reports one hour following radiation his headache get worse with intense pressure behind right eye. Patient no prescribed decadron. Reports difficulty sleeping at night. Denies diplopia or ringing in the ears. Reports he continues to take Bactrim as directed. Scheduled to see Humphrey Rolls on Tuesday but, understands she is on leave. Questions if he should continue to take Temodar following completion of radiation since he "got the pills later." Hyperpigmentation with dry desquamation note forehead. Reports using Biafine bid as directed                                 Set-up films were reviewed.                                 The chart was checked. Physical Findings. . .  weight is 185 lb 4.8 oz (84.052 kg). His blood pressure is 121/91 and his pulse is 88. His respiration is 16. . Weight essentially stable.  No significant changes. Impression . . . . . . . The patient is tolerating radiation. Plan . . . . . . . . . . . . Continue treatment as planned.  Complete radiation tomorrow.  Patient advised to stop Temodar and Bactrim upon radiation completion.  ________________________________  Sheral Apley Tammi Klippel, M.D.

## 2013-08-23 NOTE — Progress Notes (Addendum)
Reports headache 7 on a scale of 0-10. Reports one hour following radiation his headache get worse with intense pressure behind right eye. Patient no prescribed decadron. Reports difficulty sleeping at night. Denies diplopia or ringing in the ears. Reports he continues to take Bactrim as directed. Scheduled to see Humphrey Rolls on Tuesday but, understands she is on leave. Questions if he should continue to take Temodar following completion of radiation since he "got the pills later." Hyperpigmentation with dry desquamation note forehead. Reports using Biafine bid as directed.

## 2013-08-24 ENCOUNTER — Encounter: Payer: Self-pay | Admitting: Radiation Oncology

## 2013-08-24 ENCOUNTER — Ambulatory Visit: Payer: MEDICAID

## 2013-08-24 NOTE — Addendum Note (Signed)
Encounter addended by: Heywood Footman, RN on: 08/24/2013  9:54 AM<BR>     Documentation filed: Visit Diagnoses

## 2013-08-27 ENCOUNTER — Other Ambulatory Visit: Payer: No Typology Code available for payment source

## 2013-08-27 ENCOUNTER — Ambulatory Visit: Payer: No Typology Code available for payment source | Admitting: Oncology

## 2013-08-27 ENCOUNTER — Ambulatory Visit: Payer: MEDICAID

## 2013-08-27 ENCOUNTER — Telehealth: Payer: Self-pay | Admitting: Radiation Oncology

## 2013-08-27 NOTE — Telephone Encounter (Signed)
Patient did not show for treatment on Friday and cancelled today. Phoned patient's daughter, ride, and caretaker to inquire about patient status. No answer. Left message requesting return call.

## 2013-08-28 ENCOUNTER — Ambulatory Visit: Payer: MEDICAID

## 2013-08-28 ENCOUNTER — Other Ambulatory Visit (HOSPITAL_BASED_OUTPATIENT_CLINIC_OR_DEPARTMENT_OTHER): Payer: No Typology Code available for payment source

## 2013-08-28 ENCOUNTER — Ambulatory Visit (HOSPITAL_BASED_OUTPATIENT_CLINIC_OR_DEPARTMENT_OTHER): Payer: No Typology Code available for payment source | Admitting: Hematology and Oncology

## 2013-08-28 ENCOUNTER — Telehealth: Payer: Self-pay | Admitting: Adult Health

## 2013-08-28 ENCOUNTER — Ambulatory Visit
Admission: RE | Admit: 2013-08-28 | Discharge: 2013-08-28 | Disposition: A | Discharge status: Home / Self Care | Payer: No Typology Code available for payment source | Source: Ambulatory Visit | Attending: Radiation Oncology | Admitting: Radiation Oncology

## 2013-08-28 VITALS — BP 124/77 | HR 95 | Temp 98.0°F | Resp 20 | Ht 71.0 in | Wt 184.9 lb

## 2013-08-28 DIAGNOSIS — E119 Type 2 diabetes mellitus without complications: Secondary | ICD-10-CM

## 2013-08-28 DIAGNOSIS — C711 Malignant neoplasm of frontal lobe: Secondary | ICD-10-CM

## 2013-08-28 DIAGNOSIS — C719 Malignant neoplasm of brain, unspecified: Secondary | ICD-10-CM

## 2013-08-28 LAB — CBC WITH DIFFERENTIAL/PLATELET
BASO%: 1.4 % (ref 0.0–2.0)
BASOS ABS: 0.1 10*3/uL (ref 0.0–0.1)
EOS ABS: 0.5 10*3/uL (ref 0.0–0.5)
EOS%: 6.2 % (ref 0.0–7.0)
HEMATOCRIT: 47.6 % (ref 38.4–49.9)
HEMOGLOBIN: 16.3 g/dL (ref 13.0–17.1)
LYMPH#: 1.9 10*3/uL (ref 0.9–3.3)
LYMPH%: 22.1 % (ref 14.0–49.0)
MCH: 31.3 pg (ref 27.2–33.4)
MCHC: 34.4 g/dL (ref 32.0–36.0)
MCV: 91 fL (ref 79.3–98.0)
MONO#: 0.9 10*3/uL (ref 0.1–0.9)
MONO%: 9.7 % (ref 0.0–14.0)
NEUT#: 5.3 10*3/uL (ref 1.5–6.5)
NEUT%: 60.6 % (ref 39.0–75.0)
Platelets: 298 10*3/uL (ref 140–400)
RBC: 5.22 10*6/uL (ref 4.20–5.82)
RDW: 13.5 % (ref 11.0–14.6)
WBC: 8.8 10*3/uL (ref 4.0–10.3)

## 2013-08-28 LAB — COMPREHENSIVE METABOLIC PANEL (CC13)
ALT: 34 U/L (ref 0–55)
ANION GAP: 11 meq/L (ref 3–11)
AST: 25 U/L (ref 5–34)
Albumin: 4.1 g/dL (ref 3.5–5.0)
Alkaline Phosphatase: 94 U/L (ref 40–150)
BILIRUBIN TOTAL: 0.29 mg/dL (ref 0.20–1.20)
BUN: 9.6 mg/dL (ref 7.0–26.0)
CALCIUM: 10.5 mg/dL — AB (ref 8.4–10.4)
CHLORIDE: 104 meq/L (ref 98–109)
CO2: 25 meq/L (ref 22–29)
Creatinine: 1 mg/dL (ref 0.7–1.3)
GLUCOSE: 176 mg/dL — AB (ref 70–140)
Potassium: 4.3 mEq/L (ref 3.5–5.1)
Sodium: 140 mEq/L (ref 136–145)
Total Protein: 7.8 g/dL (ref 6.4–8.3)

## 2013-08-28 NOTE — Telephone Encounter (Signed)
, °

## 2013-08-28 NOTE — Progress Notes (Signed)
OFFICE PROGRESS NOTE  CC  Clinton Gallant, MD [address] [place] [address] Dr. [name]  Chief complaint: Follow up visit for astrocytoma  DIAGNOSIS: 48 year old gentleman with new diagnosis of low-grade astrocytoma.  PRIOR THERAPY: As per previously documented note:  #1 patient originally presented with new onset seizures in January 2015. Workup with CT scan revealed ill-defined low attenuation in the medial right frontal lobe and the inferior right frontal lobe. MRI showed infiltrative mass. Patient underwent stereotactic right frontal biopsy in 05/24/2013. The pathology showed low-grade astrocytoma.  #2 patient has been receiving radiation therapy by Dr. [name]. He also just recently began Temodar 75 mg per meter squared. He understands she will be taking the Temodar on a daily basis.  CURRENT THERAPY: Concurrent radiation therapy with Temodar on a daily basis.  INTERVAL HISTORY: Jonathan Barker 48 y.o. male returns for followup visit. He is on concurrent Temodar and radiation therapy. He will be completing radiation therapy on 08/31/2013. He is tolerating radiation therapy very well with minimal headaches. Denies any blurred vision, dizziness. He does take Bactrim DS on a daily basis for PCP prophylaxis. He has no nausea or vomiting. He denies any constipation, blood in the stool, blood in the urine, coughing up blood, chest pain, palpitations or shortness of breath  Remainder of the 10 point review of systems is negative.  MEDICAL HISTORY: Past Medical History  Diagnosis Date  . Shortness of breath   . DM (diabetes mellitus) 08/09/2011    A1c at diagnosis = 12.4  . GERD (gastroesophageal reflux disease) 08/09/2011  . Seizure 05/19/2013  . Astrocytoma of frontal lobe 05/31/2013    ALLERGIES:  has No Known Allergies.  MEDICATIONS:  Current Outpatient Prescriptions  Medication Sig Dispense Refill  . metFORMIN (GLUCOPHAGE) 1000 MG tablet Take 1 tablet  (1,000 mg total) by mouth daily.  90 tablet  1  . oxyCODONE-acetaminophen (PERCOCET/ROXICET) 5-325 MG per tablet Take 1-2 tablets by mouth every 6 (six) hours as needed for severe pain.  90 tablet  0  . phenytoin (DILANTIN) 100 MG ER capsule Take 1 capsule (100 mg total) by mouth 3 (three) times daily.  90 capsule  11  . prochlorperazine (COMPAZINE) 10 MG tablet Take 1/2 hour prior to taking temodar daily  30 tablet  4  . sulfamethoxazole-trimethoprim (BACTRIM DS,SEPTRA DS) 800-160 MG per tablet Take 1 tablet by mouth 2 (two) times daily. Pt reports he has been taking once daily b/c that is what his instructions on the bottle say.  He will clarify with MD & Pharmacist.      . temozolomide (TEMODAR) 140 MG capsule Take 1 capsule daily for the duration of radiation therapy. May take on an empty stomach or at bedtime to decrease nausea & vomiting.  30 capsule  2  . Alum & Mag Hydroxide-Simeth (MAGIC MOUTHWASH W/LIDOCAINE) SOLN Take 5 mLs by mouth 4 (four) times daily as needed for mouth pain.  500 mL  3  . LORazepam (ATIVAN) 1 MG tablet Take 1 tablet (1 mg total) by mouth every 8 (eight) hours.  60 tablet  0   No current facility-administered medications for this visit.    SURGICAL HISTORY:  Past Surgical History  Procedure Laterality Date  . Appendectomy    . Brain biopsy N/A 05/24/2013    Procedure: Craniotomy for open biopsy;  Surgeon: Winfield Cunas, MD;  Location: [facility];  Service: Neurosurgery;  Laterality: N/A;  Craniotomy for open biopsy  REVIEW OF SYSTEMS:  Pertinent symptoms were mentioned in interval history   PHYSICAL EXAMINATION: Blood pressure 124/77, pulse 95, temperature 98 F (36.7 C), temperature source Oral, resp. rate 20, height 5\' 11"  (1.803 m), weight 184 lb 14.4 oz (83.87 kg). Body mass index is 25.8 kg/(m^2). ECOG PERFORMANCE STATUS: 1 - Symptomatic but completely ambulatory HEENT PERRLA, sclerae and conjunctiva no pallor, neck supple no JVD no thyromegaly   General appearance: alert, cooperative and appears stated age Resp: clear to auscultation bilaterally Back: symmetric, no curvature. ROM normal. No CVA tenderness. Cardio: regular rate and rhythm GI: soft, non-tender; bowel sounds normal; no masses,  no organomegaly Extremities: extremities normal, atraumatic, no cyanosis or edema Neurologic: Grossly normal   LABORATORY DATA: Lab Results  Component Value Date   WBC 8.8 08/28/2013   HGB 16.3 08/28/2013   HCT 47.6 08/28/2013   MCV 91.0 08/28/2013   PLT 298 08/28/2013      Chemistry      Component Value Date/Time   NA 140 08/28/2013 1336   NA 141 05/21/2013 0510   K 4.3 08/28/2013 1336   K 4.0 05/21/2013 0510   CL 105 05/21/2013 0510   CO2 25 08/28/2013 1336   CO2 24 05/21/2013 0510   BUN 9.6 08/28/2013 1336   BUN 14 05/21/2013 0510   CREATININE 1.0 08/28/2013 1336   CREATININE 0.80 05/21/2013 0510      Component Value Date/Time   CALCIUM 10.5* 08/28/2013 1336   CALCIUM 8.9 05/21/2013 0510   ALKPHOS 94 08/28/2013 1336   ALKPHOS 89 05/18/2013 1908   AST 25 08/28/2013 1336   AST 24 05/18/2013 1908   ALT 34 08/28/2013 1336   ALT 28 05/18/2013 1908   BILITOT 0.29 08/28/2013 1336   BILITOT 0.2* 05/18/2013 1908       ASSESSMENT/PLAN: 48 year old gentleman with  #1 low-grade astrocytoma status post right frontal biopsy in January 22. Pathology revealed low-grade astrocytoma. He is currently receiving radiation therapy with concurrent Temodar at 75 mg per meter squared. He is tolerating it well without any problems. He'll be completing concurrent Temozolamide and radiation therapy on 08/31/2013.  I have asked the patient to stop taking temozolomide after completion of radiation therapy  and to  follow with Dr. Humphrey Rolls in 3 weeks for further recommendations.  #2 Continue Bactrim DS while he  is on temozolomide  #3 Continue Zofran as needed for nausea and vomiting  #4 History of hepatitis and diabetes. Followup with primary care physician as  scheduled   All questions were answered. The patient knows to call the clinic with any problems, questions or concerns. We can certainly see the patient much sooner if necessary.  I spent 20 minutes counseling the patient face to face. The total time spent in the appointment was 30 minutes.    Wilmon Arms, MD Medical/Oncology [place] [phone] (Office)       The

## 2013-08-29 ENCOUNTER — Ambulatory Visit
Admission: RE | Admit: 2013-08-29 | Discharge: 2013-08-29 | Disposition: A | Discharge status: Home / Self Care | Payer: No Typology Code available for payment source | Source: Ambulatory Visit | Attending: Radiation Oncology | Admitting: Radiation Oncology

## 2013-08-29 ENCOUNTER — Ambulatory Visit: Payer: MEDICAID

## 2013-08-30 ENCOUNTER — Ambulatory Visit: Payer: MEDICAID

## 2013-08-30 ENCOUNTER — Ambulatory Visit: Payer: MEDICAID | Admitting: Radiation Oncology

## 2013-08-30 ENCOUNTER — Telehealth: Payer: Self-pay | Admitting: Radiation Oncology

## 2013-08-30 ENCOUNTER — Telehealth: Payer: Self-pay | Admitting: *Deleted

## 2013-08-30 NOTE — Telephone Encounter (Signed)
Called pt x 2. No answer. Left a detailed message for pt to call this nurse back @ [phone] concerning labs. Message to be forwarded to Charlestine Massed, NP.

## 2013-08-30 NOTE — Telephone Encounter (Signed)
Patient cancelled appointment for today. Phoned daughter's phone. No answer but, left message. Phoned patient's girlfriend, Otila Kluver. She reports the patient had another appointment and couldn't make treatment. Explained we would be glad to work around appointment if we knew in advance. Encouraged that he get in to finish up treatment. She confirms he is feeling OK and without complaints. She expressed appreciation for he call.

## 2013-08-31 ENCOUNTER — Ambulatory Visit
Admission: RE | Admit: 2013-08-31 | Discharge: 2013-08-31 | Disposition: A | Discharge status: Home / Self Care | Payer: No Typology Code available for payment source | Source: Ambulatory Visit | Attending: Radiation Oncology | Admitting: Radiation Oncology

## 2013-08-31 ENCOUNTER — Ambulatory Visit: Payer: MEDICAID

## 2013-08-31 ENCOUNTER — Encounter: Payer: Self-pay | Admitting: Radiation Oncology

## 2013-08-31 ENCOUNTER — Other Ambulatory Visit: Payer: Self-pay | Admitting: Radiation Oncology

## 2013-08-31 DIAGNOSIS — C711 Malignant neoplasm of frontal lobe: Secondary | ICD-10-CM

## 2013-08-31 MED ORDER — PHENYTOIN SODIUM EXTENDED 100 MG PO CAPS: 100.0000 mg | ORAL_CAPSULE | Freq: Three times a day (TID) | ORAL | Status: DC

## 2013-08-31 NOTE — Progress Notes (Signed)
  Radiation Oncology         [phone]) [phone] ________________________________  Name: Jonathan Barker MRN: [id number]  Date: 08/31/2013  DOB: 1965/09/13  Weekly Radiation Therapy Management  Current Dose: 57.6 Gy     Planned Dose:  59.4 Gy  Narrative . . . . . . . . The patient presents for routine under treatment assessment.                                   The patient is without complaint.                                 Set-up films were reviewed.                                 The chart was checked. Physical Findings. . . . Weight essentially stable.  No significant changes. Impression . . . . . . . The patient is tolerating radiation. Plan . . . . . . . . . . . . Continue treatment as planned.  I refilled his Dilantin today.  ________________________________  Sheral Apley Tammi Klippel, M.D.

## 2013-09-03 ENCOUNTER — Encounter: Payer: Self-pay | Admitting: Radiation Oncology

## 2013-09-03 ENCOUNTER — Ambulatory Visit
Admission: RE | Admit: 2013-09-03 | Discharge: 2013-09-03 | Disposition: A | Discharge status: Home / Self Care | Payer: No Typology Code available for payment source | Source: Ambulatory Visit | Attending: Radiation Oncology | Admitting: Radiation Oncology

## 2013-09-03 ENCOUNTER — Telehealth: Payer: Self-pay

## 2013-09-03 ENCOUNTER — Ambulatory Visit: Payer: MEDICAID

## 2013-09-03 VITALS — BP 121/81 | HR 88 | Temp 97.4°F | Ht 71.0 in | Wt 187.3 lb

## 2013-09-03 DIAGNOSIS — C711 Malignant neoplasm of frontal lobe: Secondary | ICD-10-CM

## 2013-09-03 NOTE — Progress Notes (Signed)
  Radiation Oncology         [phone]) [phone] ________________________________  Name: Jonathan Barker MRN: [id number]  Date: 09/03/2013  DOB: 17-Apr-1966  Weekly Radiation Therapy Management  Current Dose: 59.4 Gy     Planned Dose:  59.4 Gy  Narrative . . . . . . . . The patient presents for the final under treatment assessment.                                            The patient has had some continuation of previously noted symptoms.                                 Set-up films were reviewed.                                 The chart was checked. Physical Findings. . . Weight essentially stable.  No significant changes. Impression . . . . . . . The patient tolerated radiation relatively well. Plan . . . . . . . . . . . . Complete radiation today as scheduled, and follow-up in one month. The patient was encouraged to call or return to the clinic in the interim for any worsening symptoms.  ________________________________  Sheral Apley Tammi Klippel, M.D.

## 2013-09-03 NOTE — Telephone Encounter (Signed)
Dilantin script didn't go through electronically to [place] so it was called into pharmacy today as patient is there waiting to pick up.

## 2013-09-03 NOTE — Progress Notes (Signed)
Mr. Johanson has completed radiation to his brain.  He c/o of his "usual" after treatment frontal  Headache which also involves his right eye with a  6/10 pain level.  He denies any nausea, ataxia, nausea, nor changes in fine motor movement.  Temodar concurrently with radiation.

## 2013-09-05 ENCOUNTER — Telehealth: Payer: Self-pay | Admitting: *Deleted

## 2013-09-05 NOTE — Progress Notes (Signed)
  Radiation Oncology         [phone]) [phone] ________________________________  Name: Jonathan Barker MRN: [id number]  Date: 09/03/2013  DOB: April 28, 1966  End of Treatment Note  Diagnosis:   48 yo gentleman with a bifrontal (right greater than left) 8.7 cm grade II astrocytoma  Indication for treatment:  Curative, Local Control       Radiation treatment dates:   06/18/2013-09/03/2013  Site/dose:  Initially, the patient was prescribed to receive a total dose of 54 Gy in 30 fractions.  However, after his first 5 fractions, he was diagnosed with a staph infection at the craniotomy incision, and treatment was held from 2/24 to 3/16 until wound closure.  Accordingly, 3 fractions were added to his prescription to deliver 59.4 Gy in 33 treatments.  Beams/energy:   The proximity of the large brain tumor to the optic structures (eyes, chiasm, optic nerves), required IMRT with TomoTherapy using 6 MV X-rays in helical intensity modulated delivery.  MV-CT was obtained for treatment position daily.  Narrative: The patient tolerated radiation treatment relatively well.   During the course of radiation, patient continued to suffer with a right frontal headache with 6/10 pain. He was able to tolerate concurrent chemotherapy with Temodar and his radiation.  Plan: The patient has completed radiation treatment. The patient will return to radiation oncology clinic for routine followup in one month. I advised them to call or return sooner if they have any questions or concerns related to their recovery or treatment. ________________________________  Sheral Apley. Tammi Klippel, M.D.

## 2013-09-05 NOTE — Telephone Encounter (Signed)
Called pt to see if he received my message on Apr. 30th concerning his calcium level(10.5). Pt was not home but I did talk to his wife.She said he did get my message. Also I asked her if pt was taking any calcium supplementation,she said no. I told her to remind pt to drink plenty of water and we will recheck labs on next office visit. Pt's spouse verbalized understanding. Message to forwarded to Charlestine Massed, NP.

## 2013-09-10 ENCOUNTER — Telehealth: Payer: Self-pay | Admitting: Radiation Oncology

## 2013-09-10 ENCOUNTER — Other Ambulatory Visit: Payer: Self-pay | Admitting: Radiation Oncology

## 2013-09-10 DIAGNOSIS — C711 Malignant neoplasm of frontal lobe: Secondary | ICD-10-CM

## 2013-09-10 MED ORDER — OXYCODONE-ACETAMINOPHEN 5-325 MG PO TABS: 1.0000 | ORAL_TABLET | Freq: Four times a day (QID) | ORAL | Status: DC | PRN

## 2013-09-10 NOTE — Telephone Encounter (Signed)
Phoned patient's daughter, Overton Mam. Explained that her Dad's pain script is ready for pick up in the rad onc nurses station. Instructed her to bring driver's license needed for identification to pick up script. She verbalized understanding.

## 2013-09-18 ENCOUNTER — Telehealth: Payer: Self-pay | Admitting: Oncology

## 2013-09-18 ENCOUNTER — Other Ambulatory Visit (HOSPITAL_BASED_OUTPATIENT_CLINIC_OR_DEPARTMENT_OTHER): Payer: No Typology Code available for payment source

## 2013-09-18 ENCOUNTER — Encounter: Payer: No Typology Code available for payment source | Admitting: Adult Health

## 2013-09-18 DIAGNOSIS — C711 Malignant neoplasm of frontal lobe: Secondary | ICD-10-CM

## 2013-09-18 DIAGNOSIS — C719 Malignant neoplasm of brain, unspecified: Secondary | ICD-10-CM

## 2013-09-18 LAB — COMPREHENSIVE METABOLIC PANEL (CC13)
ALBUMIN: 4 g/dL (ref 3.5–5.0)
ALT: 34 U/L (ref 0–55)
AST: 22 U/L (ref 5–34)
Alkaline Phosphatase: 86 U/L (ref 40–150)
Anion Gap: 11 mEq/L (ref 3–11)
BUN: 10.3 mg/dL (ref 7.0–26.0)
CALCIUM: 9.3 mg/dL (ref 8.4–10.4)
CHLORIDE: 107 meq/L (ref 98–109)
CO2: 19 mEq/L — ABNORMAL LOW (ref 22–29)
Creatinine: 0.9 mg/dL (ref 0.7–1.3)
Glucose: 186 mg/dl — ABNORMAL HIGH (ref 70–140)
POTASSIUM: 4.1 meq/L (ref 3.5–5.1)
Sodium: 137 mEq/L (ref 136–145)
Total Bilirubin: 0.28 mg/dL (ref 0.20–1.20)
Total Protein: 7.3 g/dL (ref 6.4–8.3)

## 2013-09-18 LAB — CBC WITH DIFFERENTIAL/PLATELET
BASO%: 1.3 % (ref 0.0–2.0)
BASOS ABS: 0.1 10*3/uL (ref 0.0–0.1)
EOS%: 5.3 % (ref 0.0–7.0)
Eosinophils Absolute: 0.4 10*3/uL (ref 0.0–0.5)
HCT: 48.5 % (ref 38.4–49.9)
HEMOGLOBIN: 16.6 g/dL (ref 13.0–17.1)
LYMPH#: 1.5 10*3/uL (ref 0.9–3.3)
LYMPH%: 19 % (ref 14.0–49.0)
MCH: 31.2 pg (ref 27.2–33.4)
MCHC: 34.2 g/dL (ref 32.0–36.0)
MCV: 91.2 fL (ref 79.3–98.0)
MONO#: 0.8 10*3/uL (ref 0.1–0.9)
MONO%: 10.9 % (ref 0.0–14.0)
NEUT%: 63.5 % (ref 39.0–75.0)
NEUTROS ABS: 5 10*3/uL (ref 1.5–6.5)
Platelets: 271 10*3/uL (ref 140–400)
RBC: 5.31 10*6/uL (ref 4.20–5.82)
RDW: 13 % (ref 11.0–14.6)
WBC: 7.8 10*3/uL (ref 4.0–10.3)

## 2013-09-18 NOTE — Telephone Encounter (Signed)
pt escorted to scheduling by desk nurse. clinic running behind and pt cannot wait. appt for today r/s to 6/2. pt has new d/t.

## 2013-09-20 NOTE — Progress Notes (Signed)
This encounter was created in error - please disregard.

## 2013-09-30 ENCOUNTER — Other Ambulatory Visit: Payer: Self-pay | Admitting: Radiation Oncology

## 2013-09-30 DIAGNOSIS — C711 Malignant neoplasm of frontal lobe: Secondary | ICD-10-CM

## 2013-09-30 MED ORDER — OXYCODONE-ACETAMINOPHEN 5-325 MG PO TABS: 1.0000 | ORAL_TABLET | Freq: Four times a day (QID) | ORAL | Status: DC | PRN

## 2013-10-01 ENCOUNTER — Telehealth: Payer: Self-pay | Admitting: Radiation Oncology

## 2013-10-01 NOTE — Telephone Encounter (Addendum)
Late entry from 0930 this morning. Patient left message requesting refill of his percocet. Phoned patient back explaining script is ready for pick up at the rad onc nurses station. Patient verbalized understanding.

## 2013-10-02 ENCOUNTER — Telehealth: Payer: Self-pay | Admitting: Adult Health

## 2013-10-02 ENCOUNTER — Ambulatory Visit: Payer: Self-pay | Admitting: Adult Health

## 2013-10-02 NOTE — Telephone Encounter (Signed)
, °

## 2013-10-04 ENCOUNTER — Ambulatory Visit: Admission: RE | Admit: 2013-10-04 | Payer: MEDICAID | Source: Ambulatory Visit | Admitting: Radiation Oncology

## 2013-10-05 ENCOUNTER — Telehealth: Payer: Self-pay | Admitting: *Deleted

## 2013-10-05 NOTE — Telephone Encounter (Signed)
Called Trixie Rude, significant other, to schedule missed FU.  Asked her to call me to confirm a new apt on 10/11/13.

## 2013-10-08 ENCOUNTER — Ambulatory Visit (HOSPITAL_BASED_OUTPATIENT_CLINIC_OR_DEPARTMENT_OTHER): Payer: No Typology Code available for payment source | Admitting: Oncology

## 2013-10-08 ENCOUNTER — Encounter: Payer: Self-pay | Admitting: Oncology

## 2013-10-08 VITALS — BP 117/80 | HR 81 | Temp 97.4°F | Resp 18 | Ht 71.0 in | Wt 183.7 lb

## 2013-10-08 DIAGNOSIS — C711 Malignant neoplasm of frontal lobe: Secondary | ICD-10-CM

## 2013-10-08 NOTE — Progress Notes (Signed)
OFFICE PROGRESS NOTE  CC  Clinton Gallant, MD [address] [place] [address] Dr. [name]  DIAGNOSIS: 48 year old gentleman with new diagnosis of low-grade astrocytoma.  PRIOR THERAPY:  #1 patient originally presented with new onset seizures in January 2015. Workup with CT scan revealed ill-defined low attenuation in the medial right frontal lobe and the inferior right frontal lobe. MRI showed infiltrative mass. Patient underwent stereotactic right frontal biopsy in 05/24/2013. The pathology showed low-grade astrocytoma.  #2 patient has been receiving radiation therapy by Dr. [name]. He also received Temodar 75 mg per meter squared along with the XRT. Radiation was completed on 09/03/13.  CURRENT THERAPY: Observation. Here to discuss further treatment.  INTERVAL HISTORY: Jonathan Barker 48 y.o. male returns for followup visit with his daughter. He is denying any headaches double vision blurring of vision fevers chills night sweats. He has no nausea or vomiting. Remainder of the 10 point review of systems is negative.  MEDICAL HISTORY: Past Medical History  Diagnosis Date  . Shortness of breath   . DM (diabetes mellitus) 08/09/2011    A1c at diagnosis = 12.4  . GERD (gastroesophageal reflux disease) 08/09/2011  . Seizure 05/19/2013  . Astrocytoma of frontal lobe 05/31/2013    ALLERGIES:  has No Known Allergies.  MEDICATIONS:  Current Outpatient Prescriptions  Medication Sig Dispense Refill  . metFORMIN (GLUCOPHAGE) 1000 MG tablet Take 1 tablet (1,000 mg total) by mouth daily.  90 tablet  1  . oxyCODONE-acetaminophen (PERCOCET/ROXICET) 5-325 MG per tablet Take 1-2 tablets by mouth every 6 (six) hours as needed for severe pain.  90 tablet  0  . phenytoin (DILANTIN) 100 MG ER capsule TAKE 1 CAPSULE BY MOUTH 3 TIMES DAILY  90 capsule  0  . prochlorperazine (COMPAZINE) 10 MG tablet Take 1/2 hour prior to taking temodar daily  30 tablet  4   No current  facility-administered medications for this visit.    SURGICAL HISTORY:  Past Surgical History  Procedure Laterality Date  . Appendectomy    . Brain biopsy N/A 05/24/2013    Procedure: Craniotomy for open biopsy;  Surgeon: Winfield Cunas, MD;  Location: [facility];  Service: Neurosurgery;  Laterality: N/A;  Craniotomy for open biopsy    REVIEW OF SYSTEMS:  Pertinent items are noted in HPI.     PHYSICAL EXAMINATION: Blood pressure 117/80, pulse 81, temperature 97.4 F (36.3 C), temperature source Oral, resp. rate 18, height 5\' 11"  (1.803 m), weight 183 lb 11.2 oz (83.326 kg), SpO2 100.00%. Body mass index is 25.63 kg/(m^2). ECOG PERFORMANCE STATUS: 1 - Symptomatic but completely ambulatory   General appearance: alert, cooperative and appears stated age Resp: clear to auscultation bilaterally Back: symmetric, no curvature. ROM normal. No CVA tenderness. Cardio: regular rate and rhythm GI: soft, non-tender; bowel sounds normal; no masses,  no organomegaly Extremities: extremities normal, atraumatic, no cyanosis or edema Neurologic: Grossly normal   LABORATORY DATA: Lab Results  Component Value Date   WBC 7.8 09/18/2013   HGB 16.6 09/18/2013   HCT 48.5 09/18/2013   MCV 91.2 09/18/2013   PLT 271 09/18/2013      Chemistry      Component Value Date/Time   NA 137 09/18/2013 0931   NA 141 05/21/2013 0510   K 4.1 09/18/2013 0931   K 4.0 05/21/2013 0510   CL 105 05/21/2013 0510   CO2 19* 09/18/2013 0931   CO2 24 05/21/2013 0510   BUN 10.3 09/18/2013 0931  BUN 14 05/21/2013 0510   CREATININE 0.9 09/18/2013 0931   CREATININE 0.80 05/21/2013 0510      Component Value Date/Time   CALCIUM 9.3 09/18/2013 0931   CALCIUM 8.9 05/21/2013 0510   ALKPHOS 86 09/18/2013 0931   ALKPHOS 89 05/18/2013 1908   AST 22 09/18/2013 0931   AST 24 05/18/2013 1908   ALT 34 09/18/2013 0931   ALT 28 05/18/2013 1908   BILITOT 0.28 09/18/2013 0931   BILITOT 0.2* 05/18/2013 1908       RADIOGRAPHIC STUDIES:  No  results found.  ASSESSMENT/PLAN: 48 year old gentleman with  #1 low-grade astrocytoma status post right frontal biopsy in January 22. Pathology revealed low-grade astrocytoma. S/P concurrent XRT with Temodar. Case reviewed with Dr Marko Plume who consulted with Dr Tammi Klippel. Plan is for the patient to see Dr Tammi Klippel later this week who will order an MRI of the brain. Pending these results, we will decide of further treatment for his astrocytoma.  #2 history of hepatitis and diabetes. This is managed by his primary care physician.  #3 Follow-up in 3-4 weeks.   All questions were answered. The patient knows to call the clinic with any problems, questions or concerns. We can certainly see the patient much sooner if necessary.  I spent 15 minutes counseling the patient face to face. The total time spent in the appointment was 20 minutes.   Mikey Bussing, DNP, AGPCNP-BC

## 2013-10-09 ENCOUNTER — Telehealth: Payer: Self-pay | Admitting: Oncology

## 2013-10-09 NOTE — Telephone Encounter (Signed)
lvm for pt on daughter phon [phone] per pof....for July

## 2013-10-11 ENCOUNTER — Encounter: Payer: Self-pay | Admitting: Radiation Oncology

## 2013-10-11 ENCOUNTER — Ambulatory Visit
Admission: RE | Admit: 2013-10-11 | Discharge: 2013-10-11 | Disposition: A | Discharge status: Home / Self Care | Payer: No Typology Code available for payment source | Source: Ambulatory Visit | Attending: Radiation Oncology | Admitting: Radiation Oncology

## 2013-10-11 VITALS — BP 115/76 | HR 92 | Temp 97.7°F | Resp 20 | Wt 184.0 lb

## 2013-10-11 DIAGNOSIS — R569 Unspecified convulsions: Secondary | ICD-10-CM

## 2013-10-11 DIAGNOSIS — C711 Malignant neoplasm of frontal lobe: Secondary | ICD-10-CM

## 2013-10-11 MED ORDER — PHENYTOIN SODIUM EXTENDED 100 MG PO CAPS: ORAL_CAPSULE | ORAL | Status: DC

## 2013-10-11 NOTE — Progress Notes (Signed)
Pt denies HA, pain but states he took Oxycodone at 12:30 pm today. He states he has daily HA, denies nausea, blurred vision, seizure activity. He states Temodar was d/c. He has FU with Dr Marko Plume in July. Pt requests refill on Dilantin, Oxycodone. He states he takes Oxycodone 2 tabs at bedtime and often wakes during the night and takes another tablet.

## 2013-10-14 ENCOUNTER — Encounter: Payer: Self-pay | Admitting: Radiation Oncology

## 2013-10-14 NOTE — Progress Notes (Signed)
  Radiation Oncology         [phone]) [phone] ________________________________  Name: Jonathan Barker MRN: [id number]  Date: 10/11/2013  DOB: Sep 08, 1965  Follow-Up Visit Note  CC: Clinton Gallant, MD  Clinton Gallant, MD  Diagnosis:   48 yo gentleman with a bifrontal (right greater than left) 8.7 cm grade II astrocytoma s/p radiotherapy 06/18/2013-09/03/2013 to 59.4 Gy   Interval Since Last Radiation:  4  weeks  Narrative:  The patient returns today for routine follow-up.  Pt denies current HA, pain but states he took Oxycodone at 12:30 pm today. He states he has daily HA, denies nausea, blurred vision, seizure activity. He states Temodar was d/c. He has FU with Dr Marko Plume in July. Pt requests refill on Dilantin, Oxycodone. He states he takes Oxycodone 2 tabs at bedtime and often wakes during the night and takes another tablet                               ALLERGIES:  has No Known Allergies.  Meds: Current Outpatient Prescriptions  Medication Sig Dispense Refill  . metFORMIN (GLUCOPHAGE) 1000 MG tablet Take 1 tablet (1,000 mg total) by mouth daily.  90 tablet  1  . oxyCODONE-acetaminophen (PERCOCET/ROXICET) 5-325 MG per tablet Take 1-2 tablets by mouth every 6 (six) hours as needed for severe pain.  90 tablet  0  . phenytoin (DILANTIN) 100 MG ER capsule TAKE 1 CAPSULE BY MOUTH 3 TIMES DAILY  90 capsule  5  . prochlorperazine (COMPAZINE) 10 MG tablet Take 1/2 hour prior to taking temodar daily  30 tablet  4   No current facility-administered medications for this encounter.    Physical Findings: The patient is in no acute distress. Patient is alert and oriented.  weight is 184 lb (83.462 kg). His oral temperature is 97.7 F (36.5 C). His blood pressure is 115/76 and his pulse is 92. His respiration is 20. Marland Kitchen  Small focal eschar over craniotomy incision without active infection.  No significant changes.  Lab Findings: Lab Results  Component Value Date   WBC 7.8 09/18/2013   HGB 16.6 09/18/2013   HCT 48.5 09/18/2013   MCV 91.2 09/18/2013   PLT 271 09/18/2013    Impression:  The patient is recovering from the effects of radiation.    Plan:  MRI in the next 2 weeks prior to follow-up with Dr. Marko Plume as post-treatment baseline.  This may show pseudo-progression.  Likely pursue next brain MRI 2 months thereafter.  _____________________________________  Sheral Apley. Tammi Klippel, M.D.

## 2013-10-15 ENCOUNTER — Other Ambulatory Visit: Payer: Self-pay | Admitting: Radiation Therapy

## 2013-10-15 DIAGNOSIS — C711 Malignant neoplasm of frontal lobe: Secondary | ICD-10-CM

## 2013-10-16 ENCOUNTER — Telehealth: Payer: Self-pay | Admitting: Radiation Oncology

## 2013-10-16 NOTE — Telephone Encounter (Signed)
Phoned [facility], patient's daughter, to inform her that her [facility] appeal letter is ready for pick up in rad onc nursing. She verbalized understanding and expressed intent to pick it up later today.

## 2013-10-23 ENCOUNTER — Other Ambulatory Visit: Payer: Self-pay | Admitting: Radiation Oncology

## 2013-10-23 DIAGNOSIS — C711 Malignant neoplasm of frontal lobe: Secondary | ICD-10-CM

## 2013-10-23 MED ORDER — OXYCODONE-ACETAMINOPHEN 5-325 MG PO TABS: 1.0000 | ORAL_TABLET | Freq: Four times a day (QID) | ORAL | Status: DC | PRN

## 2013-10-24 ENCOUNTER — Telehealth: Payer: Self-pay | Admitting: Radiation Oncology

## 2013-10-24 NOTE — Telephone Encounter (Signed)
Phoned patient's daughter and mode of transportation. Left message informing her that the script her father requested is ready for pick up in the rad onc nursing station.

## 2013-10-26 ENCOUNTER — Ambulatory Visit (HOSPITAL_COMMUNITY)
Admission: RE | Admit: 2013-10-26 | Discharge: 2013-10-26 | Disposition: A | Discharge status: Home / Self Care | Payer: No Typology Code available for payment source | Source: Ambulatory Visit | Attending: Radiation Oncology | Admitting: Radiation Oncology

## 2013-10-26 DIAGNOSIS — R569 Unspecified convulsions: Secondary | ICD-10-CM

## 2013-10-26 DIAGNOSIS — C711 Malignant neoplasm of frontal lobe: Secondary | ICD-10-CM | POA: Insufficient documentation

## 2013-10-26 MED ORDER — GADOBENATE DIMEGLUMINE 529 MG/ML IV SOLN
18.0000 mL | Freq: Once | INTRAVENOUS | Status: AC | PRN
Start: 2013-10-26 — End: 2013-10-26
  Administered 2013-10-26: 18 mL via INTRAVENOUS

## 2013-10-27 ENCOUNTER — Other Ambulatory Visit: Payer: Self-pay | Admitting: Oncology

## 2013-10-30 ENCOUNTER — Telehealth: Payer: Self-pay | Admitting: Oncology

## 2013-10-30 ENCOUNTER — Telehealth: Payer: Self-pay | Admitting: Radiation Oncology

## 2013-10-30 NOTE — Telephone Encounter (Signed)
PT WANTED TO VERIFY APPT W/DR. MANNING WHICH IS 12/26/13. HE THEN WANTED TO RESCHEDULE HIS APPT W/DR. [place] [address].

## 2013-10-30 NOTE — Telephone Encounter (Signed)
pt cld to state he needed to r/s tro 7/6-adv a 2:00 opening-pt took appt-noted the date & time-pt understood

## 2013-10-31 ENCOUNTER — Ambulatory Visit: Payer: Self-pay | Admitting: Oncology

## 2013-10-31 ENCOUNTER — Other Ambulatory Visit: Payer: Self-pay

## 2013-11-04 ENCOUNTER — Other Ambulatory Visit: Payer: Self-pay | Admitting: Oncology

## 2013-11-05 ENCOUNTER — Ambulatory Visit: Payer: Self-pay | Admitting: Oncology

## 2013-11-05 ENCOUNTER — Other Ambulatory Visit: Payer: Self-pay

## 2013-11-05 ENCOUNTER — Telehealth: Payer: Self-pay | Admitting: Oncology

## 2013-11-05 NOTE — Telephone Encounter (Signed)
, °

## 2013-11-12 ENCOUNTER — Telehealth: Payer: Self-pay | Admitting: Radiation Oncology

## 2013-11-12 ENCOUNTER — Other Ambulatory Visit: Payer: Self-pay | Admitting: Radiation Oncology

## 2013-11-12 DIAGNOSIS — C711 Malignant neoplasm of frontal lobe: Secondary | ICD-10-CM

## 2013-11-12 DIAGNOSIS — G8928 Other chronic postprocedural pain: Secondary | ICD-10-CM

## 2013-11-12 DIAGNOSIS — T8132XD Disruption of internal operation (surgical) wound, not elsewhere classified, subsequent encounter: Secondary | ICD-10-CM

## 2013-11-12 MED ORDER — OXYCODONE-ACETAMINOPHEN 5-325 MG PO TABS: 1.0000 | ORAL_TABLET | Freq: Four times a day (QID) | ORAL | Status: DC | PRN

## 2013-11-12 NOTE — Telephone Encounter (Signed)
Patient requested this writer phone his daughter with his MRI results. Phoned [facility] and explained per Dr. Tammi Klippel the "MRI looks better" and that her Dad could pick up his Percocet script at the rad onc nursing station with his driver's license. She verbalized understanding and expressed appreciation for the call.

## 2013-11-16 ENCOUNTER — Telehealth: Payer: Self-pay | Admitting: Oncology

## 2013-11-16 ENCOUNTER — Encounter: Payer: Self-pay | Admitting: Oncology

## 2013-11-16 ENCOUNTER — Other Ambulatory Visit: Payer: Self-pay

## 2013-11-16 ENCOUNTER — Ambulatory Visit (HOSPITAL_BASED_OUTPATIENT_CLINIC_OR_DEPARTMENT_OTHER): Payer: No Typology Code available for payment source | Admitting: Oncology

## 2013-11-16 ENCOUNTER — Ambulatory Visit: Payer: Self-pay | Admitting: Oncology

## 2013-11-16 ENCOUNTER — Other Ambulatory Visit (HOSPITAL_BASED_OUTPATIENT_CLINIC_OR_DEPARTMENT_OTHER): Payer: No Typology Code available for payment source

## 2013-11-16 VITALS — BP 117/84 | HR 76 | Temp 97.9°F | Resp 18 | Ht 71.0 in | Wt 179.1 lb

## 2013-11-16 DIAGNOSIS — F17200 Nicotine dependence, unspecified, uncomplicated: Secondary | ICD-10-CM

## 2013-11-16 DIAGNOSIS — E119 Type 2 diabetes mellitus without complications: Secondary | ICD-10-CM

## 2013-11-16 DIAGNOSIS — C711 Malignant neoplasm of frontal lobe: Secondary | ICD-10-CM

## 2013-11-16 LAB — COMPREHENSIVE METABOLIC PANEL (CC13)
ALBUMIN: 3.9 g/dL (ref 3.5–5.0)
ALK PHOS: 83 U/L (ref 40–150)
ALT: 35 U/L (ref 0–55)
AST: 23 U/L (ref 5–34)
Anion Gap: 8 mEq/L (ref 3–11)
BUN: 10.8 mg/dL (ref 7.0–26.0)
CALCIUM: 9.6 mg/dL (ref 8.4–10.4)
CO2: 27 mEq/L (ref 22–29)
Chloride: 107 mEq/L (ref 98–109)
Creatinine: 0.9 mg/dL (ref 0.7–1.3)
Glucose: 175 mg/dl — ABNORMAL HIGH (ref 70–140)
POTASSIUM: 4.2 meq/L (ref 3.5–5.1)
Sodium: 141 mEq/L (ref 136–145)
Total Bilirubin: 0.27 mg/dL (ref 0.20–1.20)
Total Protein: 7.1 g/dL (ref 6.4–8.3)

## 2013-11-16 LAB — CBC WITH DIFFERENTIAL/PLATELET
BASO%: 1.3 % (ref 0.0–2.0)
BASOS ABS: 0.1 10*3/uL (ref 0.0–0.1)
EOS%: 5.3 % (ref 0.0–7.0)
Eosinophils Absolute: 0.5 10*3/uL (ref 0.0–0.5)
HEMATOCRIT: 46.8 % (ref 38.4–49.9)
HGB: 16.1 g/dL (ref 13.0–17.1)
LYMPH%: 20.9 % (ref 14.0–49.0)
MCH: 31.2 pg (ref 27.2–33.4)
MCHC: 34.3 g/dL (ref 32.0–36.0)
MCV: 91.1 fL (ref 79.3–98.0)
MONO#: 0.9 10*3/uL (ref 0.1–0.9)
MONO%: 10.1 % (ref 0.0–14.0)
NEUT%: 62.4 % (ref 39.0–75.0)
NEUTROS ABS: 5.7 10*3/uL (ref 1.5–6.5)
PLATELETS: 279 10*3/uL (ref 140–400)
RBC: 5.14 10*6/uL (ref 4.20–5.82)
RDW: 12.3 % (ref 11.0–14.6)
WBC: 9.2 10*3/uL (ref 4.0–10.3)
lymph#: 1.9 10*3/uL (ref 0.9–3.3)

## 2013-11-16 NOTE — Telephone Encounter (Signed)
Per pof to sch pt appt-gave pt copy of sch °

## 2013-11-16 NOTE — Progress Notes (Signed)
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 EMR is reviewed at length and history confirmed with patient and daughter here now. Patient completed radiation with concomitant Temodar on 09-03-13. He had post-treatment baseline cranial MRI on 10-26-13, which I have reviewed with patient and daughter now; Dr Tammi Klippel had noted that this might show pseudo-progression. He is to have next MRI 12-25-13 and to see Dr Tammi Klippel on 12-26-13.   The MRI from 10-26-13 appears stable overall, does have 4 mm area centrally in right frontal lesion which will be followed on next imaging.   ONCOLOGIC HISTORY Presented with seizures Jan 2015, with bifrontal mass. Stereotactic biopsy 05-24-13 showed low grade/ Grade II  astrocytoma. He received IMRT with TomoTherapy 59.4 Gy with concomitant Temodar from 06-18-2013 thru 09-03-2013, course interrupted due to staph infection at craniotomy incision early on. Temodar was [facility] with completion of that RT.  Review of systems as above, also: No noted seizures, some HA during night, which he controls with oxycodone 5-325 two at hs, one ~ 0300 and one ~ midday. Fatigue and soreness lower legs when walks short distances. Appetite good, not following diabetic diet including drinking regular sodas. SOB walking, continues to smoke tho is down to 1 ppd from 2 ppd. No cough, no fever.  Remainder of 10 point Review of Systems negative.  Past Medical History  Otherwise significant for diabetes x 3 years, which he does not monitor at home, on oral agent Long and ongoing tobacco Likely COPD, possible PVD  Social: is homeless, stays with daughter ~ 2 nights weekly and variously with other  friends. Daughter can locate him if needed. Has smoked >=1ppd since age 62, has not ever quit but is interested in doing this now "I cannot afford them".  Objective:  Vital signs in last 24 hours:  BP 117/84  Pulse 76  Temp(Src) 97.9 F (36.6 C) (Oral)  Resp 18  Ht 5\' 11"  (1.803 m)  Wt 179 lb 1.6 oz (81.239 kg)  BMI 24.99 kg/m2 Weight is down over 6 lbs from April. Alert, oriented and appropriate. Ambulatory without difficulty. Pleasant, cooperative, fairly good historian. Partial alopecia HEENT:PERRL, sclerae not icteric. Oral mucosa moist without lesions, posterior pharynx clear. Craniotomy scar on right now well healed. Neck supple. No JVD.  Lymphatics:no cervical,suraclavicular adenopathy Resp: diminished breath sounds thruout, otherwise clear to auscultation bilaterally and no dullness to percussion bilaterally Cardio: regular rate and rhythm. No gallop. GI: soft, nontender, no mass or organomegaly. Normally active bowel sounds.  Musculoskeletal/ Extremities: without pitting edema, cords, tenderness Neuro: speech fluent and appropriate, CN intact. Motor strength symmetrical. Gait unremarkable Skin without rash, ecchymosis, petechiae   Lab Results:  Results for orders placed in visit on 11/16/13  CBC WITH DIFFERENTIAL      Result Value Ref Range   WBC 9.2  4.0 - 10.3 10e3/uL   NEUT# 5.7  1.5 - 6.5 10e3/uL   HGB 16.1  13.0 - 17.1 g/dL   HCT 46.8  38.4 - 49.9 %   Platelets 279  140 - 400 10e3/uL   MCV 91.1  79.3 - 98.0 fL   MCH 31.2  27.2 - 33.4 pg   MCHC 34.3  32.0 - 36.0 g/dL  RBC 5.14  4.20 - 5.82 10e6/uL   RDW 12.3  11.0 - 14.6 %   lymph# 1.9  0.9 - 3.3 10e3/uL   MONO# 0.9  0.1 - 0.9 10e3/uL   Eosinophils Absolute 0.5  0.0 - 0.5 10e3/uL   Basophils Absolute 0.1  0.0 - 0.1 10e3/uL   NEUT% 62.4  39.0 - 75.0 %   LYMPH% 20.9  14.0 - 49.0 %   MONO% 10.1  0.0 - 14.0 %   EOS% 5.3  0.0 - 7.0 %   BASO% 1.3  0.0 - 2.0 %  COMPREHENSIVE METABOLIC PANEL (UY40)       Result Value Ref Range   Sodium 141  136 - 145 mEq/L   Potassium 4.2  3.5 - 5.1 mEq/L   Chloride 107  98 - 109 mEq/L   CO2 27  22 - 29 mEq/L   Glucose 175 (*) 70 - 140 mg/dl   BUN 10.8  7.0 - 26.0 mg/dL   Creatinine 0.9  0.7 - 1.3 mg/dL   Total Bilirubin 0.27  0.20 - 1.20 mg/dL   Alkaline Phosphatase 83  40 - 150 U/L   AST 23  5 - 34 U/L   ALT 35  0 - 55 U/L   Total Protein 7.1  6.4 - 8.3 g/dL   Albumin 3.9  3.5 - 5.0 g/dL   Calcium 9.6  8.4 - 10.4 mg/dL   Anion Gap 8  3 - 11 mEq/L     Studies/Results: MRI HEAD WITHOUT AND WITH CONTRAST 10-26-13 COMPARISON: MR brain 05/18/2013. Postoperative CT head 05/25/2013.  FINDINGS:  Since the prior MR, the patient has undergone right frontal brain  biopsy. This appears uncomplicated. The patient has also completed  radiation therapy.  An infiltrative right frontal mass with contralateral extension to  the left frontal lobe is re-demonstrated. The mass is difficult to  measure exactly due to its infiltrative nature, as well as lack of  peripheral enhancement. Representative measurements are annotated on  the FLAIR sequence. On image 17, the right hemisphere lesion is  approximately 38 x 79 mm. On image 16, the left hemisphere lesion is  approximately 19 x 42 mm. Caudal extension on the right involving  the uncinate fasciculus, external capsule, and insular cortex, shows  asymmetry as far inferiorly as image 12. Overall, when compared with  the prior MR head, the extent of the signal prolongation on FLAIR  imaging appears improved with regard to the right posterior frontal  parasagittal cortex. No significant progression or improvement on  the left. No evidence for pseudoprogression.  Tiny area of susceptibility artifact surrounds the brain biopsy,  consistent with appearance on postoperative CT.  Following administration of contrast, there is a roughly 4 mm focus  of ill-defined enhancement centrally within the right frontal  lesion,  best seen on image 38 series 11, image 21 series 12, and  image 10 series 13. This area was probably present previously, but  was obscured by patient motion and poor image quality on the  previous exam (sagittal and coronal images obtained previously were  processed images of low resolution). Attention to this area on  followup with regard to presence or absence of developing  neovascularity is recommended.  IMPRESSION:  Stable to improved bifrontal grade 2 astrocytoma with regards to the  extent of T2 and FLAIR signal abnormality. See comments above.  4 mm area of central right frontal enhancement probably stable,  attention to this region on follow-up.  Medications: I have reviewed the patient's current medications. Dilantin is listed, tho I do not see levels in EMR and will check that with next visit here if not done in interim.  DISCUSSION: MRI information discussed as above. Discussed smoking cessation strategies and encouraged him to work on this. Discussed avoiding regular sodas.  Assessment/Plan: 1. Bifrontal (right >left) 8.7 cm grade II astrocytoma s/p radiotherapy with concomitant temodar thru 09-03-13. Post treatment baseline MRI appears stable as above. Follow up MRI and reevaluation by Dr Tammi Klippel in ~ 5 weeks. I have given him a return appointment here in Sept, but can change that if needed 2.diabetes: difficult to control adequately given social situation, discussed as above 3.long and ongoing tobacco: discussed as above 4.Bilateral LE soreness with walking: will discuss with PCP. Again encouraged smoking cessation.   Patient and daughter know to contact physician if needed prior to scheduled return visit.    Justus Duerr P, MD   11/16/2013, 11:01 AM

## 2013-11-17 ENCOUNTER — Other Ambulatory Visit: Payer: Self-pay | Admitting: Oncology

## 2013-11-17 DIAGNOSIS — C711 Malignant neoplasm of frontal lobe: Secondary | ICD-10-CM

## 2013-11-23 ENCOUNTER — Ambulatory Visit: Payer: Self-pay

## 2013-12-11 ENCOUNTER — Telehealth: Payer: Self-pay | Admitting: Radiation Oncology

## 2013-12-11 ENCOUNTER — Other Ambulatory Visit: Payer: Self-pay | Admitting: Radiation Oncology

## 2013-12-11 DIAGNOSIS — G8928 Other chronic postprocedural pain: Secondary | ICD-10-CM

## 2013-12-11 DIAGNOSIS — T8132XD Disruption of internal operation (surgical) wound, not elsewhere classified, subsequent encounter: Secondary | ICD-10-CM

## 2013-12-11 DIAGNOSIS — C711 Malignant neoplasm of frontal lobe: Secondary | ICD-10-CM

## 2013-12-11 MED ORDER — OXYCODONE-ACETAMINOPHEN 5-325 MG PO TABS: 1.0000 | ORAL_TABLET | Freq: Four times a day (QID) | ORAL | Status: DC | PRN

## 2013-12-11 NOTE — Telephone Encounter (Signed)
Phoned patient's daughter making her aware that her father's pain script is ready for pick up in the rad onc nurses station. She verbalized understanding and expressed appreciation for the call.

## 2013-12-25 ENCOUNTER — Encounter: Payer: Self-pay | Admitting: Radiation Oncology

## 2013-12-25 ENCOUNTER — Ambulatory Visit (HOSPITAL_COMMUNITY)
Admission: RE | Admit: 2013-12-25 | Discharge: 2013-12-25 | Disposition: A | Discharge status: Home / Self Care | Payer: No Typology Code available for payment source | Source: Ambulatory Visit | Attending: Radiation Oncology | Admitting: Radiation Oncology

## 2013-12-25 DIAGNOSIS — C711 Malignant neoplasm of frontal lobe: Secondary | ICD-10-CM

## 2013-12-25 MED ORDER — GADOBENATE DIMEGLUMINE 529 MG/ML IV SOLN
17.0000 mL | Freq: Once | INTRAVENOUS | Status: AC | PRN
  Administered 2013-12-25: 17 mL via INTRAVENOUS

## 2013-12-25 NOTE — Progress Notes (Signed)
Radiation Oncology         [id number]) [phone] ________________________________  Name: Jonathan Barker MRN: [id number]  Date: 12/26/2013  DOB: [birth year]  Multidisciplinary Brain and Spine Oncology Clinic Follow-Up Visit Note  CC: Clinton Gallant, MD  Clinton Gallant, MD  Diagnosis:   48 yo gentleman with a bifrontal (right greater than left) 8.7 cm grade II astrocytoma s/p radiotherapy 06/18/2013-09/03/2013 to 59.4 Gy   Interval Since Last Radiation:  4 months  Narrative:  The patient returns today for routine follow-up.  The recent films were presented in our multidisciplinary conference with neuroradiology just prior to the clinic.  Denies headache so long as he takes his percocet. Denies nausea, vomiting, or dizziness. Reports fatigue. Denies diplopia or ringing in ears. Steady gait noted. Weight and vitals stable.                              ALLERGIES:  has No Known Allergies.  Meds: Current Outpatient Prescriptions  Medication Sig Dispense Refill  . metFORMIN (GLUCOPHAGE) 1000 MG tablet Take 1 tablet (1,000 mg total) by mouth daily.  90 tablet  1  . [START ON 01/04/2014] oxyCODONE-acetaminophen (PERCOCET/ROXICET) 5-325 MG per tablet Take 1-2 tablets by mouth every 6 (six) hours as needed for severe pain.  90 tablet  0  . phenytoin (DILANTIN) 100 MG ER capsule TAKE 1 CAPSULE BY MOUTH 3 TIMES DAILY  90 capsule  5  . prochlorperazine (COMPAZINE) 10 MG tablet Take 1/2 hour prior to taking temodar daily  30 tablet  4   No current facility-administered medications for this encounter.    Physical Findings: The patient is in no acute distress. Patient is alert and oriented.  Surgical incision well healed.  weight is 174 lb 4.8 oz (79.062 kg). His blood pressure is 114/85 and his pulse is 71. His respiration is 16 and oxygen saturation is 98%. .  No significant changes.  Lab Findings: Lab Results  Component Value Date   WBC 9.2 11/16/2013   HGB 16.1 11/16/2013   HCT 46.8 11/16/2013   MCV 91.1  11/16/2013   PLT 279 11/16/2013   Radiographic Findings: [name] OA Contrast  12/25/2013   CLINICAL DATA:  Grade 2 astrocytoma.  Post radiation.  EXAM: MRI HEAD WITHOUT AND WITH CONTRAST  TECHNIQUE: Multiplanar, multiecho pulse sequences of the brain and surrounding structures were obtained without and with intravenous contrast.  CONTRAST:  44mL MULTIHANCE GADOBENATE DIMEGLUMINE 529 MG/ML IV SOLN  COMPARISON:  MRI 10/28/2013  FINDINGS: Scanning at 3 Tesla  Infiltrating mass in the right frontal lobe is ill-defined and similar to the prior study hyperintensity in the upper aspect of the corpus callosum anteriorly again noted with extension of hyperintense signal into the left frontal lobe. This also is unchanged.  Postcontrast imaging reveals a 58mm ill-defined area of enhancement within the right frontal lobe. This is unchanged from the prior study and most likely is tumor related enhancement.  Postcontrast axial images suggest enhancement in the gyrus rectus on the left however this is not confirmed on coronal images in this most likely is artifact related to adjacent sinus. This is remote from the area of left frontal tumor.  Ventricle size is normal. No shift of the midline structures. Negative for acute infarct  Post biopsy changes right frontal lobe with small amount of hemorrhage are stable.  IMPRESSION: Infiltrating mass in the right frontal lobe crossing into the left frontal lobe appear  stable. Small area of enhancement in the right frontal lobe measuring approximately 5 mm also stable.  Probable artifact on postcontrast images in the left gyrus rectus, attention to this on follow-up scanning is suggested.   Electronically Signed   By: Franchot Gallo M.D.   On: 12/25/2013 18:18   Impression:  The patient is recovering from the effects of radiation.  His MRI is stable   Plan:  In accordance with NCCN guidelines, which suggest MRI every 3-6 months at this point, I will repeat MRI in 3 months and  follow-up.  _____________________________________  Sheral Apley Tammi Klippel, M.D.

## 2013-12-26 ENCOUNTER — Ambulatory Visit: Payer: No Typology Code available for payment source | Admitting: Radiation Oncology

## 2013-12-26 ENCOUNTER — Encounter: Payer: Self-pay | Admitting: Radiation Oncology

## 2013-12-26 ENCOUNTER — Ambulatory Visit
Admission: RE | Admit: 2013-12-26 | Discharge: 2013-12-26 | Disposition: A | Discharge status: Home / Self Care | Payer: No Typology Code available for payment source | Source: Ambulatory Visit | Attending: Radiation Oncology | Admitting: Radiation Oncology

## 2013-12-26 VITALS — BP 114/85 | HR 71 | Resp 16 | Wt 174.3 lb

## 2013-12-26 DIAGNOSIS — G8928 Other chronic postprocedural pain: Secondary | ICD-10-CM

## 2013-12-26 DIAGNOSIS — C711 Malignant neoplasm of frontal lobe: Secondary | ICD-10-CM

## 2013-12-26 DIAGNOSIS — T8132XD Disruption of internal operation (surgical) wound, not elsewhere classified, subsequent encounter: Secondary | ICD-10-CM

## 2013-12-26 MED ORDER — OXYCODONE-ACETAMINOPHEN 5-325 MG PO TABS: 1.0000 | ORAL_TABLET | Freq: Four times a day (QID) | ORAL | Status: DC | PRN

## 2013-12-26 NOTE — Progress Notes (Addendum)
Surgical incision well healed. Denies headache so long as he takes his percocet. Denies nausea, vomiting, or dizziness. Reports fatigue. Denies diplopia or ringing in ears. Steady gait noted. Weight and vitals stable. Denies disability for a second time.

## 2014-01-13 ENCOUNTER — Other Ambulatory Visit: Payer: Self-pay | Admitting: Oncology

## 2014-01-13 DIAGNOSIS — C711 Malignant neoplasm of frontal lobe: Secondary | ICD-10-CM

## 2014-01-13 DIAGNOSIS — IMO0002 Reserved for concepts with insufficient information to code with codable children: Secondary | ICD-10-CM

## 2014-01-13 DIAGNOSIS — E1165 Type 2 diabetes mellitus with hyperglycemia: Secondary | ICD-10-CM

## 2014-01-16 ENCOUNTER — Ambulatory Visit (HOSPITAL_BASED_OUTPATIENT_CLINIC_OR_DEPARTMENT_OTHER): Payer: No Typology Code available for payment source | Admitting: Oncology

## 2014-01-16 ENCOUNTER — Encounter: Payer: Self-pay | Admitting: Oncology

## 2014-01-16 ENCOUNTER — Other Ambulatory Visit (HOSPITAL_BASED_OUTPATIENT_CLINIC_OR_DEPARTMENT_OTHER): Payer: No Typology Code available for payment source

## 2014-01-16 VITALS — BP 128/80 | HR 69 | Temp 97.5°F | Resp 18 | Ht 71.0 in | Wt 176.3 lb

## 2014-01-16 DIAGNOSIS — E119 Type 2 diabetes mellitus without complications: Secondary | ICD-10-CM

## 2014-01-16 DIAGNOSIS — IMO0002 Reserved for concepts with insufficient information to code with codable children: Secondary | ICD-10-CM

## 2014-01-16 DIAGNOSIS — C711 Malignant neoplasm of frontal lobe: Secondary | ICD-10-CM

## 2014-01-16 DIAGNOSIS — E1165 Type 2 diabetes mellitus with hyperglycemia: Secondary | ICD-10-CM

## 2014-01-16 DIAGNOSIS — F17200 Nicotine dependence, unspecified, uncomplicated: Secondary | ICD-10-CM

## 2014-01-16 LAB — BASIC METABOLIC PANEL (CC13)
ANION GAP: 10 meq/L (ref 3–11)
BUN: 11.6 mg/dL (ref 7.0–26.0)
CO2: 24 mEq/L (ref 22–29)
Calcium: 9.5 mg/dL (ref 8.4–10.4)
Chloride: 105 mEq/L (ref 98–109)
Creatinine: 1.1 mg/dL (ref 0.7–1.3)
Glucose: 188 mg/dl — ABNORMAL HIGH (ref 70–140)
Potassium: 4.1 mEq/L (ref 3.5–5.1)
Sodium: 139 mEq/L (ref 136–145)

## 2014-01-16 LAB — PHENYTOIN LEVEL, TOTAL: Phenytoin Lvl: <2.5 ug/mL — ABNORMAL LOW (ref 10.0–20.0)

## 2014-01-16 NOTE — Progress Notes (Signed)
OFFICE PROGRESS NOTE   01/16/2014   Physicians:M.De Hollingshead (PCP, [facility] Internal Medicine teaching service), Alexis Goodell, Ashok Pall, Marcy Panning)   INTERVAL HISTORY:  Patient is seen, alone for visit, in scheduled follow up of low grade astrocytoma for which he has been on observation for past 4 months. Last MRI brain was 12-25-13, stable. He saw Dr Tammi Klippel after that MRI and will see him again after next scan in 3 months.  Patient denies any new or different problems. Chronic HAs are unchanged, controlled with prn percocet. No seizures. No neurologic deficits. He does not check blood sugars and does not watch diet, including regular sodas. No recent infectious illness. Continues to smoke.  He does not have central catheter. He declines flu vaccine today.  Contact phone is daughter Overton Mam  [phone]   ONCOLOGIC HISTORY Presented with seizures Jan 2015, with bifrontal mass. Stereotactic biopsy 05-24-13 showed low grade/ Grade II astrocytoma. He received IMRT with TomoTherapy 59.4 Gy with concomitant Temodar from 06-18-2013 thru 09-03-2013, course interrupted due to staph infection at craniotomy incision early on. Temodar was [facility] with completion of RT.  Review of systems as above, also: Good appetite, good energy. No increased cough or SOB. No LE swelling. No new or different pain. Claustrophobic. Remainder of 10 point Review of Systems negative.  Objective:  Vital signs in last 24 hours:  BP 128/80  Pulse 69  Temp(Src) 97.5 F (36.4 C) (Oral)  Resp 18  Ht 5\' 11"  (1.803 m)  Wt 176 lb 4.8 oz (79.969 kg)  BMI 24.60 kg/m2 Weight is up 2 lbs Alert, oriented and appropriate. Ambulatory without difficulty.    HEENT:PERRL, sclerae not icteric. Oral mucosa moist without lesions, posterior pharynx clear.  Neck supple. No JVD.  Lymphatics:no cervical,suraclavicular adenopathy Resp: clear to auscultation bilaterally and normal percussion bilaterally Cardio: regular  rate and rhythm. No gallop. GI: soft, nontender, no organomegaly. Normally active bowel sounds. Musculoskeletal/ Extremities: without pitting edema, cords, tenderness Neuro: speech fluent and appropriate. CN intact. Gait not remarkable. Moves extremities equally. Skin without rash, ecchymosis   Lab Results:  Results for orders placed in visit on 79/39/03  BASIC METABOLIC PANEL (ES92)      Result Value Ref Range   Sodium 139  136 - 145 mEq/L   Potassium 4.1  3.5 - 5.1 mEq/L   Chloride 105  98 - 109 mEq/L   CO2 24  22 - 29 mEq/L   Glucose 188 (*) 70 - 140 mg/dl   BUN 11.6  7.0 - 26.0 mg/dL   Creatinine 1.1  0.7 - 1.3 mg/dL   Calcium 9.5  8.4 - 10.4 mg/dL   Anion Gap 10  3 - 11 mEq/L    Dilantin level available after visit low at <2.5  Studies/Results: MRI HEAD WITHOUT AND WITH CONTRAST 12-25-13 TECHNIQUE:  Multiplanar, multiecho pulse sequences of the brain and surrounding  structures were obtained without and with intravenous contrast.  CONTRAST: 53mL MULTIHANCE GADOBENATE DIMEGLUMINE 529 MG/ML IV SOLN  COMPARISON: MRI 10/28/2013  FINDINGS:  Scanning at 3 Tesla  Infiltrating mass in the right frontal lobe is ill-defined and  similar to the prior study hyperintensity in the upper aspect of the  corpus callosum anteriorly again noted with extension of  hyperintense signal into the left frontal lobe. This also is  unchanged.  Postcontrast imaging reveals a 51mm ill-defined area of enhancement  within the right frontal lobe. This is unchanged from the prior  study and most likely is tumor related  enhancement.  Postcontrast axial images suggest enhancement in the gyrus rectus on  the left however this is not confirmed on coronal images in this  most likely is artifact related to adjacent sinus. This is remote  from the area of left frontal tumor.  Ventricle size is normal. No shift of the midline structures.  Negative for acute infarct  Post biopsy changes right frontal lobe  with small amount of  hemorrhage are stable.  IMPRESSION:  Infiltrating mass in the right frontal lobe crossing into the left  frontal lobe appear stable. Small area of enhancement in the right  frontal lobe measuring approximately 5 mm also stable.  Probable artifact on postcontrast images in the left gyrus rectus,  attention to this on follow-up scanning is suggested   Medications: I have reviewed the patient's current medications. RN to confirm present dose of dilantin so that we can adjust accordingly. Percocet prescriptions are by Dr Tammi Klippel.  DISCUSSION: He is aware that Dr Tammi Klippel will be setting up MRI brain and return visit to him ~ late Nov. I will be glad to see him back at any time if he or other MDs request, but as he is not under active treatment, and with transportation difficulty I have not given him a scheduled return appointment here. I have spoken with [facility] social worker, who will be in touch with him (via daughter's phone) to try to assist with disability application, as patient tells me that this has been denied x2   Assessment/Plan:  1.bifrontal grade II astrocytoma s/p radiotherapy with concomitant temodar thru 09-03-13. Stable MRI 12-25-13 and clinically stable today. I am glad to see him back at any time if I can be of help. 2.diabetes: management difficult due to social situation. Discussed avoiding concentrated sweets and regular sodas. Followed by [facility]. 3.low dilantin level: no seizures reported. Will confirm that he is actually taking dilantin as listed in EMR prior to making any changes in dose 4.social concerns: have asked [place] social worker to follow up re disability, as clearly he is medically unable to work. Note he is homeless but does intermittently stay with daughter. 5.long and ongoing tobacco, which we have discussed today 6.declines flu vaccine     Jonathan Levan, MD   01/16/2014, 12:47 PM

## 2014-01-17 ENCOUNTER — Encounter: Payer: Self-pay | Admitting: *Deleted

## 2014-01-17 NOTE — Progress Notes (Signed)
[place] Work  Clinical Social Work was referred by Fut[employer] trader for assessment of psychosocial needs and assistance completing disability paperwork.  Clinical Social Worker attempted to contact patient by phone to offer support and assess for needs.  CSW spoke with patient's daughter Overton Mam; she agreed to give message to patient to return call as soon as possible.  Polo Riley, MSW, LCSW, OSW-C Clinical Social Worker [facility] [phone]

## 2014-01-18 ENCOUNTER — Telehealth: Payer: Self-pay

## 2014-01-18 ENCOUNTER — Other Ambulatory Visit: Payer: Self-pay | Admitting: Radiation Therapy

## 2014-01-18 DIAGNOSIS — C711 Malignant neoplasm of frontal lobe: Secondary | ICD-10-CM

## 2014-01-18 NOTE — Telephone Encounter (Signed)
Left a message for daughter regarding dilantin level as noted below by Dr. Marko Plume. Requested call back  to verify dose and receive directions on dosing and repeat labs.

## 2014-01-18 NOTE — Telephone Encounter (Signed)
Message copied by Baruch Merl on Fri Jan 18, 2014  8:45 AM ------      Message from: Evlyn Clines P      Created: Thu Jan 17, 2014  1:54 PM       Labs seen and need follow up: dilantin level low. Please contact patient (via daughter's phone listed) to be sure he is taking his medication regularly as directly, which med information lists as 100 mg tid - if he is taking it correctly, will need to increase dose and recheck level   thanks ------

## 2014-01-22 NOTE — Telephone Encounter (Signed)
Left a message for daughter to call Dr. Mariana Kaufman office regarding the dilantin level.

## 2014-01-23 ENCOUNTER — Other Ambulatory Visit: Payer: Self-pay | Admitting: Radiation Oncology

## 2014-01-23 ENCOUNTER — Telehealth: Payer: Self-pay | Admitting: Radiation Oncology

## 2014-01-23 DIAGNOSIS — T8132XD Disruption of internal operation (surgical) wound, not elsewhere classified, subsequent encounter: Secondary | ICD-10-CM

## 2014-01-23 DIAGNOSIS — C711 Malignant neoplasm of frontal lobe: Secondary | ICD-10-CM

## 2014-01-23 DIAGNOSIS — G8928 Other chronic postprocedural pain: Secondary | ICD-10-CM

## 2014-01-23 MED ORDER — OXYCODONE-ACETAMINOPHEN 5-325 MG PO TABS: 1.0000 | ORAL_TABLET | Freq: Four times a day (QID) | ORAL | Status: DC | PRN

## 2014-01-23 NOTE — Telephone Encounter (Signed)
Left message explaining her father's pain script will be ready for pick up today after 3:30.

## 2014-01-23 NOTE — Telephone Encounter (Signed)
Attempted to reach patient's daughter, Woodroe Mode. No answer - left her VM to call us back please in regards to her dads dilantin level.

## 2014-01-25 NOTE — Telephone Encounter (Signed)
Left message for [place] that Dr. Marko Plume said for [name] to continue with the Dilantin 100 mg tid.  Since he was therapeutic previously while taking the dilantin as prescribed, Dr. Marko Plume feels that he does not need to come into the office for a repeat level. Encouraged daughter to call the office if there are any problems with her dad getting his prescriptions filled.

## 2014-01-25 NOTE — Telephone Encounter (Signed)
Spoke with Daughter Woodroe Mode and she stated that her father was not able to get to the drug store until a few days ago to get his prescription of dilantin so he was not taking it TID.

## 2014-02-21 ENCOUNTER — Other Ambulatory Visit: Payer: Self-pay | Admitting: Radiation Oncology

## 2014-02-21 DIAGNOSIS — T8132XD Disruption of internal operation (surgical) wound, not elsewhere classified, subsequent encounter: Secondary | ICD-10-CM

## 2014-02-21 DIAGNOSIS — G8928 Other chronic postprocedural pain: Secondary | ICD-10-CM

## 2014-02-21 DIAGNOSIS — C711 Malignant neoplasm of frontal lobe: Secondary | ICD-10-CM

## 2014-02-21 MED ORDER — OXYCODONE-ACETAMINOPHEN 5-325 MG PO TABS: 1.0000 | ORAL_TABLET | Freq: Four times a day (QID) | ORAL | Status: DC | PRN

## 2014-03-20 ENCOUNTER — Other Ambulatory Visit: Payer: Self-pay | Admitting: Radiation Oncology

## 2014-03-20 DIAGNOSIS — G8928 Other chronic postprocedural pain: Secondary | ICD-10-CM

## 2014-03-20 DIAGNOSIS — T8132XD Disruption of internal operation (surgical) wound, not elsewhere classified, subsequent encounter: Secondary | ICD-10-CM

## 2014-03-20 DIAGNOSIS — C711 Malignant neoplasm of frontal lobe: Secondary | ICD-10-CM

## 2014-03-20 MED ORDER — OXYCODONE-ACETAMINOPHEN 5-325 MG PO TABS: 1.0000 | ORAL_TABLET | Freq: Four times a day (QID) | ORAL | Status: DC | PRN

## 2014-03-25 ENCOUNTER — Other Ambulatory Visit: Payer: Self-pay | Admitting: Radiation Oncology

## 2014-03-25 DIAGNOSIS — G8928 Other chronic postprocedural pain: Secondary | ICD-10-CM

## 2014-03-25 DIAGNOSIS — C711 Malignant neoplasm of frontal lobe: Secondary | ICD-10-CM

## 2014-03-25 DIAGNOSIS — T8132XD Disruption of internal operation (surgical) wound, not elsewhere classified, subsequent encounter: Secondary | ICD-10-CM

## 2014-03-25 MED ORDER — OXYCODONE-ACETAMINOPHEN 5-325 MG PO TABS: 1.0000 | ORAL_TABLET | Freq: Four times a day (QID) | ORAL | Status: DC | PRN

## 2014-03-27 ENCOUNTER — Other Ambulatory Visit: Payer: Self-pay | Admitting: Internal Medicine

## 2014-04-05 ENCOUNTER — Ambulatory Visit (HOSPITAL_COMMUNITY)
Admission: RE | Admit: 2014-04-05 | Discharge: 2014-04-05 | Disposition: A | Discharge status: Home / Self Care | Payer: No Typology Code available for payment source | Source: Ambulatory Visit | Attending: Radiation Oncology | Admitting: Radiation Oncology

## 2014-04-05 ENCOUNTER — Other Ambulatory Visit: Payer: Self-pay

## 2014-04-05 DIAGNOSIS — Z923 Personal history of irradiation: Secondary | ICD-10-CM | POA: Insufficient documentation

## 2014-04-05 DIAGNOSIS — C711 Malignant neoplasm of frontal lobe: Secondary | ICD-10-CM | POA: Insufficient documentation

## 2014-04-05 MED ORDER — GADOBENATE DIMEGLUMINE 529 MG/ML IV SOLN
20.0000 mL | Freq: Once | INTRAVENOUS | Status: AC | PRN
  Administered 2014-04-05: 17 mL via INTRAVENOUS

## 2014-04-07 NOTE — Progress Notes (Signed)
Radiation Oncology         [phone]   Name: Jonathan Barker   Date: 04/08/2014   MRN: [id number]  DOB: 19-May-1965    Multidisciplinary Brain and Spine Oncology Clinic Follow-Up Visit Note  CC: Clinton Gallant, MD  Clinton Gallant, MD    ICD-9-CM ICD-10-CM   1. Astrocytoma of frontal lobe 191.1 C71.1     Diagnosis:   48 yo gentleman with a bifrontal (right greater than left) 8.7 cm grade II astrocytoma s/p radiotherapy 06/18/2013-09/03/2013 to 59.4 Gy   Interval Since Last Radiation:  7  months  Narrative:  The patient returns today for routine follow-up.  The recent films were presented in our multidisciplinary conference with neuroradiology just prior to the clinic.  Denies diplopia or ringing in the ears. Denies nausea or vomiting. Denies dizziness. Reports daily headaches. Reports twitching in left eye. Reports being light sensitive as well. Vitals and weight stable.                              ALLERGIES:  has No Known Allergies.  Meds: Current Outpatient Prescriptions  Medication Sig Dispense Refill  . oxyCODONE-acetaminophen (PERCOCET/ROXICET) 5-325 MG per tablet Take 1-2 tablets by mouth every 6 (six) hours as needed for severe pain. 90 tablet 0  . phenytoin (DILANTIN) 100 MG ER capsule TAKE 1 CAPSULE BY MOUTH 3 TIMES DAILY 90 capsule 5  . metFORMIN (GLUCOPHAGE) 1000 MG tablet TAKE ONE TABLET BY MOUTH ONCE DAILY (Patient not taking: Reported on 04/08/2014) 90 tablet 0  . prochlorperazine (COMPAZINE) 10 MG tablet Take 1/2 hour prior to taking temodar daily (Patient not taking: Reported on 04/08/2014) 30 tablet 4   No current facility-administered medications for this encounter.    Physical Findings: The patient is in no acute distress. Patient is alert and oriented.  weight is 176 lb 8 oz (80.06 kg). His blood pressure is 112/77 and his pulse is 93. His respiration is 16. .  No significant changes.  Lab Findings: Lab Results  Component Value Date   WBC 9.2 11/16/2013   HGB 16.1 11/16/2013   HCT 46.8 11/16/2013   MCV 91.1 11/16/2013   PLT 279 11/16/2013    @LASTCHEM @  Radiographic Findings: [name] [facility] Contrast  04/05/2014   CLINICAL DATA:  Restaging grade 2 astrocytoma. Status post radiation completed 09/03/2013 59 Gy.  EXAM: MRI HEAD WITHOUT AND WITH CONTRAST  TECHNIQUE: Multiplanar, multiecho pulse sequences of the brain and surrounding structures were obtained without and with intravenous contrast.  CONTRAST:  60mL MULTIHANCE GADOBENATE DIMEGLUMINE 529 MG/ML IV SOLN  COMPARISON:  12/25/2013 most recent.  Initial scan 05/18/2013.  FINDINGS: Nonenhancing infiltrative bihemispheric tumor is redemonstrated with T2 and FLAIR hyperintensity. The tumor is greatest extent in the RIGHT frontal lobe where it extends through the uncinate fasciculus to the external capsule, posteriorly along the cingulate gyrus, inferiorly to the external capsule and anterior limb internal capsule, and into the contralateral anterior frontal white matter. Difficult to gauge caudal extent due to motion degradation on the coronal T2 weighted images, but estimated size of 66 x 77 x 62 mm roughly similar to priors. Postcontrast, small focus of enhancement in the RIGHT frontal lobe is stable, likely postsurgical change. Previously noted suspected artifactual gyrus rectus enhancement on the previous scan is normalized.  Minor blood products and associated facilitated diffusion RIGHT frontal lobe, all stable. No new areas of suspected tumor. Generalized atrophy. Extracranial soft  tissues unremarkable.  IMPRESSION: Stable RIGHT frontal grade 2 astrocytoma with contralateral spread. No evidence for new areas of enhancement, progression of T2 hyperintensity, or post treatment effect.   Electronically Signed   By: Rolla Flatten M.D.   On: 04/05/2014 13:02    Impression:  The patient is recovering from the effects of radiation.  He still has some headaches postoperatively.  Plan:  MRI in 3 months,  then, follow-up.  _____________________________________  Sheral Apley. Tammi Klippel, M.D.

## 2014-04-08 ENCOUNTER — Encounter: Payer: Self-pay | Admitting: Radiation Oncology

## 2014-04-08 ENCOUNTER — Ambulatory Visit
Admission: RE | Admit: 2014-04-08 | Discharge: 2014-04-08 | Disposition: A | Discharge status: Home / Self Care | Payer: No Typology Code available for payment source | Source: Ambulatory Visit | Attending: Radiation Oncology | Admitting: Radiation Oncology

## 2014-04-08 VITALS — BP 112/77 | HR 93 | Resp 16 | Wt 176.5 lb

## 2014-04-08 DIAGNOSIS — C711 Malignant neoplasm of frontal lobe: Secondary | ICD-10-CM

## 2014-04-08 DIAGNOSIS — T8132XD Disruption of internal operation (surgical) wound, not elsewhere classified, subsequent encounter: Secondary | ICD-10-CM

## 2014-04-08 DIAGNOSIS — G8928 Other chronic postprocedural pain: Secondary | ICD-10-CM

## 2014-04-08 LAB — POCT I-STAT CREATININE: Creatinine, Ser: 0.8 mg/dL (ref 0.50–1.35)

## 2014-04-08 MED ORDER — OXYCODONE-ACETAMINOPHEN 5-325 MG PO TABS: 1.0000 | ORAL_TABLET | Freq: Four times a day (QID) | ORAL | Status: DC | PRN

## 2014-04-08 NOTE — Progress Notes (Signed)
Denies diplopia or ringing in the ears. Denies nausea or vomiting. Denies dizziness. Reports daily headaches. Reports twitching in left eye. Reports being light sensitive as well. Vitals and weight stable.

## 2014-05-14 ENCOUNTER — Other Ambulatory Visit: Payer: Self-pay | Admitting: Radiation Oncology

## 2014-05-14 DIAGNOSIS — T8132XD Disruption of internal operation (surgical) wound, not elsewhere classified, subsequent encounter: Secondary | ICD-10-CM

## 2014-05-14 DIAGNOSIS — G8928 Other chronic postprocedural pain: Secondary | ICD-10-CM

## 2014-05-14 DIAGNOSIS — C711 Malignant neoplasm of frontal lobe: Secondary | ICD-10-CM

## 2014-05-14 MED ORDER — OXYCODONE-ACETAMINOPHEN 5-325 MG PO TABS: 1.0000 | ORAL_TABLET | Freq: Four times a day (QID) | ORAL | Status: DC | PRN

## 2014-05-29 ENCOUNTER — Encounter: Payer: Self-pay | Admitting: Internal Medicine

## 2014-06-10 ENCOUNTER — Other Ambulatory Visit: Payer: Self-pay | Admitting: Radiation Oncology

## 2014-06-10 ENCOUNTER — Telehealth: Payer: Self-pay | Admitting: Radiation Oncology

## 2014-06-10 DIAGNOSIS — C711 Malignant neoplasm of frontal lobe: Secondary | ICD-10-CM

## 2014-06-10 DIAGNOSIS — G8928 Other chronic postprocedural pain: Secondary | ICD-10-CM

## 2014-06-10 DIAGNOSIS — T8132XD Disruption of internal operation (surgical) wound, not elsewhere classified, subsequent encounter: Secondary | ICD-10-CM

## 2014-06-10 MED ORDER — OXYCODONE-ACETAMINOPHEN 5-325 MG PO TABS: 1.0000 | ORAL_TABLET | Freq: Four times a day (QID) | ORAL | Status: DC | PRN

## 2014-06-10 NOTE — Telephone Encounter (Signed)
Returned telephone message left by patient. Patient requesting to pick up percocet refill on 05/13/2014. Patient explained he has a ride arranged for that day. Also, patient curious as to when his MRI and follow up with Dr. Tammi Klippel are scheduled for. Explained this RN would route these request to the appropriate individuals and call him back with a response. Patient will call back if he needs his Keppra refilled.

## 2014-06-12 ENCOUNTER — Other Ambulatory Visit: Payer: Self-pay | Admitting: Radiation Oncology

## 2014-06-12 DIAGNOSIS — R569 Unspecified convulsions: Secondary | ICD-10-CM

## 2014-06-12 MED ORDER — PHENYTOIN SODIUM EXTENDED 100 MG PO CAPS: ORAL_CAPSULE | ORAL | Status: DC

## 2014-06-20 ENCOUNTER — Other Ambulatory Visit: Payer: Self-pay | Admitting: Radiation Therapy

## 2014-06-20 DIAGNOSIS — C711 Malignant neoplasm of frontal lobe: Secondary | ICD-10-CM

## 2014-07-09 ENCOUNTER — Other Ambulatory Visit: Payer: Self-pay | Admitting: Radiation Oncology

## 2014-07-09 DIAGNOSIS — C711 Malignant neoplasm of frontal lobe: Secondary | ICD-10-CM

## 2014-07-09 DIAGNOSIS — T8132XD Disruption of internal operation (surgical) wound, not elsewhere classified, subsequent encounter: Secondary | ICD-10-CM

## 2014-07-09 DIAGNOSIS — G8928 Other chronic postprocedural pain: Secondary | ICD-10-CM

## 2014-07-09 MED ORDER — OXYCODONE-ACETAMINOPHEN 5-325 MG PO TABS: 1.0000 | ORAL_TABLET | Freq: Four times a day (QID) | ORAL | Status: DC | PRN

## 2014-07-12 ENCOUNTER — Ambulatory Visit (HOSPITAL_COMMUNITY)
Admission: RE | Admit: 2014-07-12 | Discharge: 2014-07-12 | Disposition: A | Discharge status: Home / Self Care | Payer: Medicaid Other | Source: Ambulatory Visit | Attending: Radiation Oncology | Admitting: Radiation Oncology

## 2014-07-12 DIAGNOSIS — C711 Malignant neoplasm of frontal lobe: Secondary | ICD-10-CM | POA: Diagnosis not present

## 2014-07-12 LAB — CREATININE, SERUM
Creatinine, Ser: 0.79 mg/dL (ref 0.50–1.35)
GFR calc non Af Amer: >90 mL/min (ref >90)

## 2014-07-12 MED ORDER — GADOBENATE DIMEGLUMINE 529 MG/ML IV SOLN
20.0000 mL | Freq: Once | INTRAVENOUS | Status: AC | PRN
  Administered 2014-07-12: 20 mL via INTRAVENOUS

## 2014-07-14 NOTE — Progress Notes (Signed)
  Radiation Oncology         [phone]  Multidisciplinary Brain and Spine Oncology Clinic Follow-Up Visit Note  CC: Clinton Gallant, MD  Ashok Pall, MD    ICD-9-CM ICD-10-CM   1. Astrocytoma of frontal lobe 191.1 C71.1            Diagnosis:   49 yo gentleman with a bifrontal (right greater than left) 8.7 cm grade II astrocytoma s/p radiotherapy 06/18/2013-09/03/2013 to 59.4 Gy   Interval Since Last Radiation:  10  months  Narrative:   No show, to be rescheduled  _____________________________________  Sheral Apley Tammi Klippel, M.D.

## 2014-07-15 ENCOUNTER — Ambulatory Visit
Admission: RE | Admit: 2014-07-15 | Discharge: 2014-07-15 | Disposition: A | Discharge status: Home / Self Care | Payer: No Typology Code available for payment source | Source: Ambulatory Visit | Attending: Radiation Oncology | Admitting: Radiation Oncology

## 2014-07-15 DIAGNOSIS — C711 Malignant neoplasm of frontal lobe: Secondary | ICD-10-CM

## 2014-07-18 ENCOUNTER — Ambulatory Visit
Admission: RE | Admit: 2014-07-18 | Discharge: 2014-07-18 | Disposition: A | Discharge status: Home / Self Care | Payer: No Typology Code available for payment source | Source: Ambulatory Visit | Attending: Radiation Oncology | Admitting: Radiation Oncology

## 2014-07-18 ENCOUNTER — Encounter: Payer: Self-pay | Admitting: Radiation Oncology

## 2014-07-18 VITALS — BP 108/75 | HR 85 | Temp 98.0°F | Resp 16 | Wt 179.1 lb

## 2014-07-18 DIAGNOSIS — G8928 Other chronic postprocedural pain: Secondary | ICD-10-CM

## 2014-07-18 DIAGNOSIS — C711 Malignant neoplasm of frontal lobe: Secondary | ICD-10-CM

## 2014-07-18 DIAGNOSIS — T8132XD Disruption of internal operation (surgical) wound, not elsewhere classified, subsequent encounter: Secondary | ICD-10-CM

## 2014-07-18 MED ORDER — OXYCODONE-ACETAMINOPHEN 5-325 MG PO TABS: 1.0000 | ORAL_TABLET | Freq: Four times a day (QID) | ORAL | Status: DC | PRN

## 2014-07-18 NOTE — Progress Notes (Signed)
Radiation Oncology         [phone]  Multidisciplinary Brain and Spine Oncology Clinic Follow-Up Visit Note  CC: Clinton Gallant, MD  Ashok Pall, MD    ICD-9-CM ICD-10-CM   1. Astrocytoma of frontal lobe 191.1 C71.1            Diagnosis:   49 yo gentleman with a bifrontal (right greater than left) 8.7 cm grade II astrocytoma s/p radiotherapy 06/18/2013-09/03/2013 to 59.4 Gy   Interval Since Last Radiation:  10  months  Narrative:  The patient returns today for routine follow-up.  The recent films were presented in our multidisciplinary conference with neuroradiology just prior to the clinic.  Reports taking Dilantin 100 mg tid. Denies seizure activity. Reports headaches each night. Reports taking percocet before bed and at 0400. Denies nausea, vomiting, or diplopia. Reports brief occasional ringing in the ears. Weight and vitals stable                              ALLERGIES:  has No Known Allergies.  Meds: Current Outpatient Prescriptions  Medication Sig Dispense Refill  . metFORMIN (GLUCOPHAGE) 1000 MG tablet TAKE ONE TABLET BY MOUTH ONCE DAILY (Patient not taking: Reported on 04/08/2014) 90 tablet 0  . oxyCODONE-acetaminophen (PERCOCET/ROXICET) 5-325 MG per tablet Take 1-2 tablets by mouth every 6 (six) hours as needed for severe pain. 90 tablet 0  . phenytoin (DILANTIN) 100 MG ER capsule TAKE 1 CAPSULE BY MOUTH 3 TIMES DAILY 90 capsule 5  . prochlorperazine (COMPAZINE) 10 MG tablet Take 1/2 hour prior to taking temodar daily (Patient not taking: Reported on 04/08/2014) 30 tablet 4   No current facility-administered medications for this encounter.    Physical Findings: The patient is in no acute distress. Patient is alert and oriented.  vitals were not taken for this visit..  No significant changes.  Lab Findings: Lab Results  Component Value Date   WBC 9.2 11/16/2013   HGB 16.1 11/16/2013   HCT 46.8 11/16/2013   MCV 91.1 11/16/2013   PLT 279 11/16/2013     @LASTCHEM @  Radiographic Findings: [name] [facility] Contrast  07/12/2014   CLINICAL DATA:  Grade 2 astrocytoma of the right frontal lobe. Status post radiation therapy.  EXAM: MRI HEAD WITHOUT AND WITH CONTRAST  TECHNIQUE: Multiplanar, multiecho pulse sequences of the brain and surrounding structures were obtained without and with intravenous contrast.  CONTRAST:  37mL MULTIHANCE GADOBENATE DIMEGLUMINE 529 MG/ML IV SOLN  COMPARISON:  MRI of the brain without and with contrast 04/05/2014. He with him with path  FINDINGS: Grade 2 astrocytoma of the right frontal lobe is similar to the prior exam. Extent at T2 changes stable. Signal change crosses the midline at the anterior genu of the corpus callosum. T2 changes extend inferiorly to the level of the right external capsule. Signal changes in the anterior left frontal lobe are stable is well.  A solitary focus of enhancement in the subcortical white matter subjacent to the right frontal craniotomy is stable. No other pathologic enhancement is present.  Flow is present in the major intracranial arteries. The globes and orbits are intact. Mild mucosal thickening is present in the anterior ethmoid air cells. The remaining paranasal sinuses and mastoid air cells are clear.  No other pathologic enhancement is evident.  IMPRESSION: 1. Stable appearance of grade 2 astrocytoma in the right frontal lobe with signal changes extending across the corpus callosum into the  anterior left frontal lobe. 2. Stable postoperative changes right frontal craniotomy for biopsy. 3. No acute intracranial abnormality.   Electronically Signed   By: San Morelle M.D.   On: 07/12/2014 16:48    Impression:  The patient is recovering from the effects of radiation and surgery with chronic pain.  His MRI shows no recurrence.  Plan:  MRI 3 months then follow-up.  _____________________________________  Sheral Apley. Tammi Klippel, M.D.

## 2014-07-18 NOTE — Progress Notes (Signed)
Reports taking Dilantin 100 mg tid. Denies seizure activity. Reports headaches each night. Reports taking percocet before bed and at 0400. Denies nausea, vomiting, or diplopia. Reports brief occasional ringing in the ears. Weight and vitals stable.

## 2014-07-27 ENCOUNTER — Other Ambulatory Visit: Payer: Self-pay | Admitting: Internal Medicine

## 2014-09-02 ENCOUNTER — Ambulatory Visit: Payer: Self-pay | Admitting: Internal Medicine

## 2014-09-04 ENCOUNTER — Telehealth: Payer: Self-pay | Admitting: Radiation Oncology

## 2014-09-04 ENCOUNTER — Other Ambulatory Visit: Payer: Self-pay | Admitting: Radiation Oncology

## 2014-09-04 DIAGNOSIS — C711 Malignant neoplasm of frontal lobe: Secondary | ICD-10-CM

## 2014-09-04 DIAGNOSIS — G8928 Other chronic postprocedural pain: Secondary | ICD-10-CM

## 2014-09-04 DIAGNOSIS — T8132XD Disruption of internal operation (surgical) wound, not elsewhere classified, subsequent encounter: Secondary | ICD-10-CM

## 2014-09-04 MED ORDER — OXYCODONE-ACETAMINOPHEN 5-325 MG PO TABS: 1.0000 | ORAL_TABLET | Freq: Four times a day (QID) | ORAL | Status: DC | PRN

## 2014-09-04 NOTE — Telephone Encounter (Signed)
Left message that this RN received medication refill request. Will call patient back tomorrow when script is ready for pick up.

## 2014-09-05 ENCOUNTER — Telehealth: Payer: Self-pay | Admitting: *Deleted

## 2014-09-05 ENCOUNTER — Other Ambulatory Visit: Payer: Self-pay | Admitting: Radiation Oncology

## 2014-09-05 DIAGNOSIS — T8132XD Disruption of internal operation (surgical) wound, not elsewhere classified, subsequent encounter: Secondary | ICD-10-CM

## 2014-09-05 DIAGNOSIS — C711 Malignant neoplasm of frontal lobe: Secondary | ICD-10-CM

## 2014-09-05 DIAGNOSIS — G8928 Other chronic postprocedural pain: Secondary | ICD-10-CM

## 2014-09-05 MED ORDER — OXYCODONE-ACETAMINOPHEN 5-325 MG PO TABS: 1.0000 | ORAL_TABLET | Freq: Four times a day (QID) | ORAL | Status: DC | PRN

## 2014-09-05 NOTE — Telephone Encounter (Signed)
CALLED PATIENT'S DAUGHTER- SAVANNAH TO INFORM THAT SCRIPT IS READY FOR PICK-UP, SPOKE WITH SAVANNAH AND SHE IS AWARE SCRIPT IS READY

## 2014-09-06 ENCOUNTER — Encounter: Payer: Self-pay | Admitting: Internal Medicine

## 2014-09-06 ENCOUNTER — Ambulatory Visit (INDEPENDENT_AMBULATORY_CARE_PROVIDER_SITE_OTHER): Payer: Medicaid Other | Admitting: Internal Medicine

## 2014-09-06 VITALS — BP 127/86 | HR 76 | Temp 98.0°F | Ht 71.5 in | Wt 180.9 lb

## 2014-09-06 DIAGNOSIS — E1165 Type 2 diabetes mellitus with hyperglycemia: Secondary | ICD-10-CM | POA: Diagnosis present

## 2014-09-06 DIAGNOSIS — E785 Hyperlipidemia, unspecified: Secondary | ICD-10-CM | POA: Diagnosis not present

## 2014-09-06 DIAGNOSIS — IMO0002 Reserved for concepts with insufficient information to code with codable children: Secondary | ICD-10-CM

## 2014-09-06 LAB — POCT GLYCOSYLATED HEMOGLOBIN (HGB A1C): Hemoglobin A1C: 6.1

## 2014-09-06 LAB — GLUCOSE, CAPILLARY: GLUCOSE-CAPILLARY: 121 mg/dL — AB (ref 70–99)

## 2014-09-06 MED ORDER — METFORMIN HCL 1000 MG PO TABS: 1000.0000 mg | ORAL_TABLET | Freq: Every day | ORAL | Status: DC

## 2014-09-06 NOTE — Assessment & Plan Note (Signed)
Patient has not had labs done since 2013 and patient discontinued statin on his own. -Lipids checked today

## 2014-09-06 NOTE — Progress Notes (Signed)
Subjective:   Patient ID: Jonathan Barker male   DOB: [birth year] 49 y.o.   MRN: [id number]  HPI: [name] is a 49 y.o. man with a past medical history as listed below who presents for diabetes recheck.  DM - Patient does not have a meter and is not checking blood sugars. Currently taking metformin 1000 g twice a day. No hypoglycemic episodes since last visit. denies polyuria, polydipsia, nausea, vomiting, diarrhea.  does request refills today.  The patient is overly concerned about being accepted and approved for disability insurance given his unimproving brain cancer treatment. He has higher lawyer and has noticed some increased fatigue and continues to be unable to focus on complex tasks. He still has a support group that consists of his daughter and 27 year old son. He is started to cut back on smoking and is down to 1 pack per day from 2 packs per day and has abstained from alcohol.  Past Medical History  Diagnosis Date  . Shortness of breath   . DM (diabetes mellitus) 08/09/2011    A1c at diagnosis = 12.4  . GERD (gastroesophageal reflux disease) 08/09/2011  . Seizure 05/19/2013  . Astrocytoma of frontal lobe 05/31/2013   Current Outpatient Prescriptions  Medication Sig Dispense Refill  . metFORMIN (GLUCOPHAGE) 1000 MG tablet TAKE ONE TABLET BY MOUTH ONCE DAILY 90 tablet 0  . oxyCODONE-acetaminophen (PERCOCET/ROXICET) 5-325 MG per tablet Take 1-2 tablets by mouth every 6 (six) hours as needed for severe pain. 90 tablet 0  . phenytoin (DILANTIN) 100 MG ER capsule TAKE 1 CAPSULE BY MOUTH 3 TIMES DAILY 90 capsule 5  . prochlorperazine (COMPAZINE) 10 MG tablet Take 1/2 hour prior to taking temodar daily (Patient not taking: Reported on 04/08/2014) 30 tablet 4   No current facility-administered medications for this visit.   Family History  Problem Relation Age of Onset  . Breast cancer Mother   . Diabetes Father   . Heart disease Father   . Diabetes Paternal Uncle   .  Diabetes Brother    History   Social History  . Marital Status: Significant Other    Spouse Name: N/A  . Number of Children: 2  . Years of Education: N/A   Occupational History  .     Social History Main Topics  . Smoking status: Current Every Day Smoker -- 2.00 packs/day for 33 years    Types: Cigarettes  . Smokeless tobacco: Never Used     Comment: Trying to quit. Down to 1 ppd, 10/11/13 down to 1/2 ppd  . Alcohol Use: Yes     Comment: social  . Drug Use: Yes    Special: Marijuana     Comment: Marijuana daily  . Sexual Activity: Not on file   Other Topics Concern  . Not on file   Social History Narrative   [place]. He does not work now.    Review of Systems: Pertinent items are noted in HPI. Objective:  Physical Exam: Filed Vitals:   09/06/14 1327  BP: 127/86  Pulse: 76  Temp: 98 F (36.7 C)  TempSrc: Oral  Height: 5' 11.5" (1.816 m)  Weight: 180 lb 14.4 oz (82.056 kg)  SpO2: 100%   General: No acute distress HEENT: PERRL, EOMI, no scleral icterus, large frontal craniotomy scar well-healed Cardiac: RRR, no rubs, murmurs or gallops Pulm: clear to auscultation bilaterally, moving normal volumes of air Abd: soft, nontender, nondistended, BS present Ext: warm and well perfused,  no pedal edema Neuro: alert and oriented X3, cranial nerves II-XII grossly intact, working memory seems impaired  Assessment & Plan:  Please see problem oriented charting  Pt discussed with Dr. Dareen Piano

## 2014-09-06 NOTE — Patient Instructions (Signed)
General Instructions:   Please try to bring all your medicines next time. This will help [place] keep you safe from mistakes.   For your diabetes we would like you to come back to see our DM educator Butch Penny Plyler for some help on your diet and your eye exam   Progress Toward Treatment Goals:  No flowsheet data found.  Self Care Goals & Plans:  Self Care Goal 09/06/2014  Manage my medications take my medicines as prescribed; bring my medications to every visit; refill my medications on time; follow the sick day instructions if I am sick  Monitor my health keep track of my weight; check my feet daily  Eat healthy foods eat more vegetables; eat fruit for snacks and desserts; eat baked foods instead of fried foods; eat smaller portions  Be physically active find an activity I enjoy  Stop smoking -    No flowsheet data found.   Care Management & Community Referrals:  No flowsheet data found.

## 2014-09-06 NOTE — Assessment & Plan Note (Addendum)
Lab Results  Component Value Date   HGBA1C 6.1 09/06/2014   HGBA1C 11.1* 05/19/2013   HGBA1C 6.9 01/21/2012     Assessment: Diabetes control:   Progress toward A1C goal:    Comments: Patient was lost to follow-up after astrocytoma treatment and is now reestablishing care  Plan: Medications:  Will continue on current medications metformin 1000mg  BID Home glucose monitoring: Frequency:   Timing:   Instruction/counseling given: reminded to get eye exam, reminded to bring medications to each visit, discussed foot care, discussed the need for weight loss and discussed diet Educational resources provided: brochure, handout Self management tools provided:   Other plans: lipids, foot exam, lipids, urine microalbumin, and pt wanted to do retinal scan at a future date all done today

## 2014-09-09 NOTE — Progress Notes (Signed)
INTERNAL MEDICINE TEACHING ATTENDING ADDENDUM - Dyasia Firestine, MD: I reviewed and discussed at the time of visit with the resident Dr. Sadek, the patient's medical history, physical examination, diagnosis and results of pertinent tests and treatment and I agree with the patient's care as documented.  

## 2014-09-11 ENCOUNTER — Encounter: Payer: Self-pay | Admitting: *Deleted

## 2014-10-01 ENCOUNTER — Telehealth: Payer: Self-pay | Admitting: Radiation Oncology

## 2014-10-01 ENCOUNTER — Other Ambulatory Visit: Payer: Self-pay | Admitting: Radiation Oncology

## 2014-10-01 NOTE — Telephone Encounter (Signed)
Phoned patient. No answer. Left message informing him Dilantin refills are available at the pharmacy and that this RN will talk with Dr. Tammi Klippel when he returns about refilling Percocet.

## 2014-10-01 NOTE — Telephone Encounter (Signed)
Received voicemail message from patient requesting refill of Dilantin and Percocet. Phoned patient back. No answer. Left message explaining Dilantin has refills and to call his pharmacy. Also, explained the Percocet script would have to wait for Dr. Tammi Klippel to return on Thursday per Dr. Valere Dross. Encouraged to call back with questions.

## 2014-10-03 ENCOUNTER — Telehealth: Payer: Self-pay | Admitting: Radiation Oncology

## 2014-10-03 ENCOUNTER — Other Ambulatory Visit: Payer: Self-pay | Admitting: Radiation Oncology

## 2014-10-03 DIAGNOSIS — T8132XD Disruption of internal operation (surgical) wound, not elsewhere classified, subsequent encounter: Secondary | ICD-10-CM

## 2014-10-03 DIAGNOSIS — C711 Malignant neoplasm of frontal lobe: Secondary | ICD-10-CM

## 2014-10-03 DIAGNOSIS — G8928 Other chronic postprocedural pain: Secondary | ICD-10-CM

## 2014-10-03 MED ORDER — OXYCODONE-ACETAMINOPHEN 5-325 MG PO TABS: 1.0000 | ORAL_TABLET | Freq: Four times a day (QID) | ORAL | Status: DC | PRN

## 2014-10-03 NOTE — Telephone Encounter (Signed)
Phoned patient's daughter, Overton Mam. Explained Percocet script is ready for pick up at the rad onc nursing station. She verbalized understanding and plans to pick up script tomorrow morning.

## 2014-10-18 ENCOUNTER — Ambulatory Visit (HOSPITAL_COMMUNITY)
Admission: RE | Admit: 2014-10-18 | Discharge: 2014-10-18 | Disposition: A | Discharge status: Home / Self Care | Payer: Medicaid Other | Source: Ambulatory Visit | Attending: Radiation Oncology | Admitting: Radiation Oncology

## 2014-10-18 DIAGNOSIS — C719 Malignant neoplasm of brain, unspecified: Secondary | ICD-10-CM | POA: Diagnosis not present

## 2014-10-18 DIAGNOSIS — C711 Malignant neoplasm of frontal lobe: Secondary | ICD-10-CM

## 2014-10-18 LAB — CREATININE, SERUM
Creatinine, Ser: 0.77 mg/dL (ref 0.61–1.24)
GFR calc Af Amer: >60 mL/min (ref >60)
GFR calc non Af Amer: >60 mL/min (ref >60)

## 2014-10-18 MED ORDER — GADOBENATE DIMEGLUMINE 529 MG/ML IV SOLN
20.0000 mL | Freq: Once | INTRAVENOUS | Status: AC | PRN
  Administered 2014-10-18: 20 mL via INTRAVENOUS

## 2014-10-21 ENCOUNTER — Encounter: Payer: Self-pay | Admitting: Radiation Oncology

## 2014-10-21 ENCOUNTER — Ambulatory Visit
Admission: RE | Admit: 2014-10-21 | Discharge: 2014-10-21 | Disposition: A | Discharge status: Home / Self Care | Payer: Medicaid Other | Source: Ambulatory Visit | Attending: Radiation Oncology | Admitting: Radiation Oncology

## 2014-10-21 VITALS — BP 119/84 | HR 76 | Resp 16 | Wt 185.9 lb

## 2014-10-21 DIAGNOSIS — T8132XD Disruption of internal operation (surgical) wound, not elsewhere classified, subsequent encounter: Secondary | ICD-10-CM

## 2014-10-21 DIAGNOSIS — C711 Malignant neoplasm of frontal lobe: Secondary | ICD-10-CM

## 2014-10-21 DIAGNOSIS — G8928 Other chronic postprocedural pain: Secondary | ICD-10-CM

## 2014-10-21 MED ORDER — OXYCODONE-ACETAMINOPHEN 5-325 MG PO TABS: 1.0000 | ORAL_TABLET | Freq: Four times a day (QID) | ORAL | Status: DC | PRN

## 2014-10-21 NOTE — Progress Notes (Signed)
Radiation Oncology         [phone]  Multidisciplinary Brain and Spine Oncology Clinic Follow-Up Visit Note  CC: Clinton Gallant, MD  Ashok Pall, MD    ICD-9-CM ICD-10-CM   1. Astrocytoma of frontal lobe 191.1 C71.1 oxyCODONE-acetaminophen (PERCOCET/ROXICET) 5-325 MG per tablet  2. Dehiscence of closure of skull or craniotomy, subsequent encounter V58.89 T81.32XD oxyCODONE-acetaminophen (PERCOCET/ROXICET) 5-325 MG per tablet   998.31    3. Chronic postoperative pain 338.28 G89.28 oxyCODONE-acetaminophen (PERCOCET/ROXICET) 5-325 MG per tablet           Diagnosis:   49 yo gentleman with a bifrontal (right greater than left) 8.7 cm grade II astrocytoma s/p radiotherapy 06/18/2013-09/03/2013 to 59.4 Gy   Interval Since Last Radiation:  13  months  Narrative:  The patient returns today for routine follow-up.  The recent films were presented in our multidisciplinary conference with neuroradiology just prior to the clinic. Denies pain. Reports taking Dilantin 100 mg tid. Denies seizure activity. Reports a nightly headache for which he takes percocet. Denies nausea, vomiting, or diplopia. Reports about once a week he experiences ringing in his ears. Experiences weakness from 'his knees down'. He does not follow up with his medical oncology routinely.  ALLERGIES:  has No Known Allergies.  Meds: Current Outpatient Prescriptions  Medication Sig Dispense Refill  . metFORMIN (GLUCOPHAGE) 1000 MG tablet Take 1 tablet (1,000 mg total) by mouth daily. 90 tablet 12  . oxyCODONE-acetaminophen (PERCOCET/ROXICET) 5-325 MG per tablet Take 1-2 tablets by mouth every 6 (six) hours as needed for severe pain. 90 tablet 0  . phenytoin (DILANTIN) 100 MG ER capsule TAKE 1 CAPSULE BY MOUTH 3 TIMES DAILY 90 capsule 11  . prochlorperazine (COMPAZINE) 10 MG tablet Take 1/2 hour prior to taking temodar daily (Patient not taking: Reported on 04/08/2014) 30 tablet 4   No current facility-administered  medications for this encounter.    Physical Findings: The patient is in no acute distress. Patient is alert and oriented. Hair loss where the radiation treatment took place.   weight is 185 lb 14.4 oz (84.324 kg). His blood pressure is 119/84 and his pulse is 76. His respiration is 16. .  No significant changes.  Lab Findings: Lab Results  Component Value Date   WBC 9.2 11/16/2013   HGB 16.1 11/16/2013   HCT 46.8 11/16/2013   MCV 91.1 11/16/2013   PLT 279 11/16/2013    @LASTCHEM @  Radiographic Findings: [name] [facility] Contrast  10/18/2014   CLINICAL DATA:  Grade 2 astrocytoma. Surgical resection and radiation. Restaging.  EXAM: MRI HEAD WITHOUT AND WITH CONTRAST  TECHNIQUE: Multiplanar, multiecho pulse sequences of the brain and surrounding structures were obtained without and with intravenous contrast.  CONTRAST:  105mL MULTIHANCE GADOBENATE DIMEGLUMINE 529 MG/ML IV SOLN  COMPARISON:  MRI 07/12/2014 and 04/05/2014  FINDINGS: T2 hyperintensity in the right frontal lobe is stable. This extends into the cingulate gyrus and crosses the midline to the left frontal lobe. Left frontal lobe hyperintensity also is unchanged. Mild postop blood products are noted at the biopsy site along the superior aspect of the tumor, unchanged. No mass-effect or shift of the midline structures.  Postcontrast, there is a tiny focus of enhancement within the central portion of the tumor in the right frontal lobe, this is stable from both prior studies and may be a vessel or postop enhancement. Otherwise no enhancing tumor identified. The enhancement pattern is stable from prior studies.  Ventricle size is normal. Negative for  acute infarct. Remainder of the brain appears normal  IMPRESSION: Stable MRI from prior studies.  No evidence of tumor growth   Electronically Signed   By: Franchot Gallo M.D.   On: 10/18/2014 16:20    Impression:  The patient is recovering from the effects of radiation and surgery with chronic  pain.  His MRI shows no recurrence.  Plan:  Refilled percocet. MRI in 4 months with follow up after.  This document serves as a record of services personally performed by [name], MD. It was created on his behalf by Arlyce Harman, a trained medical scribe. The creation of this record is based on the scribe's personal observations and the provider's statements to them. This document has been checked and approved by the attending provider.    _____________________________________  Sheral Apley. Tammi Klippel, M.D.

## 2014-10-21 NOTE — Progress Notes (Signed)
Weight and vitals stable. Denies pain. Reports taking Dilantin 100 mg tid. Denies seizure activity. Reports a nightly headache for which he take percocet. Denies nausea, vomiting, or diplopia. Reports about once a week he experiences ringing in his ears.   BP 119/84 mmHg  Pulse 76  Resp 16  Wt 185 lb 14.4 oz (84.324 kg) Wt Readings from Last 3 Encounters:  10/21/14 185 lb 14.4 oz (84.324 kg)  09/06/14 180 lb 14.4 oz (82.056 kg)  07/18/14 179 lb 1.6 oz (81.239 kg)

## 2014-10-22 ENCOUNTER — Other Ambulatory Visit: Payer: Self-pay | Admitting: Radiation Therapy

## 2014-10-22 DIAGNOSIS — C711 Malignant neoplasm of frontal lobe: Secondary | ICD-10-CM

## 2014-11-14 ENCOUNTER — Other Ambulatory Visit: Payer: Self-pay | Admitting: Radiation Oncology

## 2014-11-14 DIAGNOSIS — C711 Malignant neoplasm of frontal lobe: Secondary | ICD-10-CM

## 2014-11-14 DIAGNOSIS — T8132XD Disruption of internal operation (surgical) wound, not elsewhere classified, subsequent encounter: Secondary | ICD-10-CM

## 2014-11-14 DIAGNOSIS — G8928 Other chronic postprocedural pain: Secondary | ICD-10-CM

## 2014-11-14 MED ORDER — OXYCODONE-ACETAMINOPHEN 5-325 MG PO TABS: 1.0000 | ORAL_TABLET | Freq: Four times a day (QID) | ORAL | Status: DC | PRN

## 2014-12-06 NOTE — Addendum Note (Signed)
Addended by: Truddie Crumble on: 12/06/2014 02:55 PM   Modules accepted: Orders

## 2014-12-11 ENCOUNTER — Other Ambulatory Visit: Payer: Self-pay | Admitting: Radiation Oncology

## 2014-12-11 DIAGNOSIS — G8928 Other chronic postprocedural pain: Secondary | ICD-10-CM

## 2014-12-11 DIAGNOSIS — C711 Malignant neoplasm of frontal lobe: Secondary | ICD-10-CM

## 2014-12-11 DIAGNOSIS — T8132XD Disruption of internal operation (surgical) wound, not elsewhere classified, subsequent encounter: Secondary | ICD-10-CM

## 2014-12-11 MED ORDER — OXYCODONE-ACETAMINOPHEN 5-325 MG PO TABS: 1.0000 | ORAL_TABLET | Freq: Four times a day (QID) | ORAL | Status: DC | PRN

## 2014-12-12 ENCOUNTER — Telehealth: Payer: Self-pay | Admitting: Radiation Oncology

## 2014-12-12 NOTE — Telephone Encounter (Signed)
Phoned patient making him aware his Percocet script is ready for pick up in rad onc nursing area. Patient verbalized understanding.

## 2014-12-30 ENCOUNTER — Telehealth: Payer: Self-pay | Admitting: Radiation Oncology

## 2014-12-30 NOTE — Telephone Encounter (Signed)
Phoned patient's daughter, Overton Mam. Informed her that her Dad's MRI is 10/14 at 1000 and his follow up with Dr. Tammi Klippel 10/17 at 0915. She verbalized understanding.

## 2015-01-07 ENCOUNTER — Telehealth: Payer: Self-pay | Admitting: Radiation Oncology

## 2015-01-07 NOTE — Telephone Encounter (Signed)
Phoned patient's daughter, Overton Mam, explaining message was received about need for percocet refill. Will call her back when script is ready for pick up.

## 2015-01-08 ENCOUNTER — Other Ambulatory Visit: Payer: Self-pay | Admitting: Radiation Oncology

## 2015-01-08 DIAGNOSIS — G8928 Other chronic postprocedural pain: Secondary | ICD-10-CM

## 2015-01-08 DIAGNOSIS — C711 Malignant neoplasm of frontal lobe: Secondary | ICD-10-CM

## 2015-01-08 DIAGNOSIS — T8132XD Disruption of internal operation (surgical) wound, not elsewhere classified, subsequent encounter: Secondary | ICD-10-CM

## 2015-01-08 MED ORDER — OXYCODONE-ACETAMINOPHEN 5-325 MG PO TABS: 1.0000 | ORAL_TABLET | Freq: Four times a day (QID) | ORAL | Status: DC | PRN

## 2015-01-09 ENCOUNTER — Other Ambulatory Visit: Payer: Self-pay | Admitting: Radiation Oncology

## 2015-01-09 ENCOUNTER — Telehealth: Payer: Self-pay | Admitting: Radiation Oncology

## 2015-01-09 DIAGNOSIS — G8928 Other chronic postprocedural pain: Secondary | ICD-10-CM

## 2015-01-09 DIAGNOSIS — T8132XD Disruption of internal operation (surgical) wound, not elsewhere classified, subsequent encounter: Secondary | ICD-10-CM

## 2015-01-09 DIAGNOSIS — C711 Malignant neoplasm of frontal lobe: Secondary | ICD-10-CM

## 2015-01-09 MED ORDER — OXYCODONE-ACETAMINOPHEN 5-325 MG PO TABS: 1.0000 | ORAL_TABLET | Freq: Four times a day (QID) | ORAL | Status: DC | PRN

## 2015-01-09 NOTE — Telephone Encounter (Signed)
Phoned patient to make him aware his Percocet script is ready for pick up in the rad onc nursing station. Patient verbalized understanding.

## 2015-01-14 ENCOUNTER — Telehealth: Payer: Self-pay | Admitting: Radiation Oncology

## 2015-01-14 ENCOUNTER — Ambulatory Visit: Payer: Medicaid Other | Admitting: Internal Medicine

## 2015-01-14 NOTE — Telephone Encounter (Signed)
Phoned patient making him aware the letter he requested is ready for pick up in the radiation oncology nursing station. Patient verbalized understanding and expressed appreciation for the call.

## 2015-01-17 ENCOUNTER — Ambulatory Visit (HOSPITAL_COMMUNITY): Payer: Medicaid Other

## 2015-01-27 ENCOUNTER — Telehealth: Payer: Self-pay | Admitting: *Deleted

## 2015-01-27 NOTE — Telephone Encounter (Signed)
On 01-27-15 fax medical records to [facility] it was consult note, sim & planning note, end of tx note, follow up note,

## 2015-01-30 ENCOUNTER — Other Ambulatory Visit: Payer: Self-pay | Admitting: Radiation Oncology

## 2015-01-30 DIAGNOSIS — C711 Malignant neoplasm of frontal lobe: Secondary | ICD-10-CM

## 2015-01-30 DIAGNOSIS — T8132XD Disruption of internal operation (surgical) wound, not elsewhere classified, subsequent encounter: Secondary | ICD-10-CM

## 2015-01-30 DIAGNOSIS — G8928 Other chronic postprocedural pain: Secondary | ICD-10-CM

## 2015-01-30 MED ORDER — OXYCODONE-ACETAMINOPHEN 5-325 MG PO TABS: 1.0000 | ORAL_TABLET | Freq: Four times a day (QID) | ORAL | Status: DC | PRN

## 2015-02-10 ENCOUNTER — Encounter: Payer: Self-pay | Admitting: Internal Medicine

## 2015-02-10 ENCOUNTER — Ambulatory Visit (INDEPENDENT_AMBULATORY_CARE_PROVIDER_SITE_OTHER): Payer: Medicaid Other | Admitting: Internal Medicine

## 2015-02-10 VITALS — BP 126/75 | HR 76 | Temp 97.8°F | Ht 71.0 in | Wt 185.7 lb

## 2015-02-10 DIAGNOSIS — E119 Type 2 diabetes mellitus without complications: Secondary | ICD-10-CM

## 2015-02-10 DIAGNOSIS — Z720 Tobacco use: Secondary | ICD-10-CM

## 2015-02-10 DIAGNOSIS — Z7984 Long term (current) use of oral hypoglycemic drugs: Secondary | ICD-10-CM

## 2015-02-10 DIAGNOSIS — C711 Malignant neoplasm of frontal lobe: Secondary | ICD-10-CM | POA: Diagnosis not present

## 2015-02-10 DIAGNOSIS — Z0000 Encounter for general adult medical examination without abnormal findings: Secondary | ICD-10-CM | POA: Insufficient documentation

## 2015-02-10 DIAGNOSIS — F17200 Nicotine dependence, unspecified, uncomplicated: Secondary | ICD-10-CM

## 2015-02-10 LAB — GLUCOSE, CAPILLARY: GLUCOSE-CAPILLARY: 175 mg/dL — AB (ref 65–99)

## 2015-02-10 NOTE — Progress Notes (Signed)
   Subjective:    Patient ID: Jonathan Barker, male    DOB: 11-02-1965, 49 y.o.   MRN: [id number]  HPI Jonathan Barker is a 49 year old male with diffuse astrocytoma, GERD, Type 2 diabetes, tobacco  who presents today for follow-up visit. Please see assessment & plan for documentation of chronic medical problems.    Review of Systems  Respiratory: Negative for shortness of breath.   Cardiovascular: Negative for chest pain and leg swelling.  Gastrointestinal: Negative for nausea, vomiting, abdominal pain, diarrhea and blood in stool.  Genitourinary: Negative for dysuria.  Neurological: Negative for dizziness.       Objective:   Physical Exam Constitutional: Middle-aged Caucasian male. No distress.  Head: Normocephalic and atraumatic.  Eyes: Conjunctivae are normal. Pupils are 3 mm, direct, consensual, near.  Cardiovascular: Normal rate, regular rhythm and normal heart sounds.  No gallop, friction rub, murmur heard. Pulmonary/Chest: Diminished breath sounds bilaterally. Effort normal. No respiratory distress. No wheezes, rales.  Abdominal: Soft. Bowel sounds are normal. No distension. No tenderness.  Neurological: Alert and oriented to person, place, and time.  Coordination normal.  Skin: Warm and dry. Not diaphoretic.  Extremities: No tibial edema.  Psychiatric: Affect mobile and congruent with thought content      Assessment & Plan:

## 2015-02-10 NOTE — Assessment & Plan Note (Signed)
  Assessment: Progress toward smoking cessation:  smoking less Barriers to progress toward smoking cessation:  stress Comments: Smoking 3/4 ppd, down from 2 ppd. He is not interested in any resources as he already has access to the Quitline should he need it.  Plan: Instruction/counseling given:  I counseled patient on the dangers of tobacco use, advised patient to stop smoking, and reviewed strategies to maximize success. Educational resources provided:   (REFUSED) Self management tools provided:    Medications to assist with smoking cessation:  None Patient agreed to the following self-care plans for smoking cessation: set a quit date and stop smoking (REFUSED/ ALREADY HAVE)  Other plans: Reassess at follow-up.

## 2015-02-10 NOTE — Assessment & Plan Note (Signed)
He declined flu or pneumonia vaccine today.

## 2015-02-10 NOTE — Assessment & Plan Note (Signed)
Due for follow-up next week.

## 2015-02-10 NOTE — Assessment & Plan Note (Addendum)
Lab Results  Component Value Date   HGBA1C 6.1 09/06/2014   HGBA1C 11.1* 05/19/2013   HGBA1C 6.9 01/21/2012     Assessment: Diabetes control: good control (HgbA1C at goal) Progress toward A1C goal:     Comments: He declined A1c testing today as he reported he would have it done at the [facility]. Adherent to metformin 1000mg  daily. CBG in the office today was 175. No glucometer log to review. He denies any polyuria, polydipsia, nausea, vomiting, abdominal pain, diarrhea.   Plan: Medications:  metformin 1000mg  Home glucose monitoring: Frequency: no home glucose monitoring Timing: N/A Instruction/counseling given: reminded to get eye exam and reminded to bring blood glucose meter & log to each visit Educational resources provided:  (REFUSED) Self management tools provided:   Other plans: Advised patient to return to to [place] to check A1c if he cannot have it done at the [facility]. May not need to be on metformin given his last A1c.

## 2015-02-14 ENCOUNTER — Ambulatory Visit (HOSPITAL_COMMUNITY)
Admission: RE | Admit: 2015-02-14 | Discharge: 2015-02-14 | Disposition: A | Discharge status: Home / Self Care | Payer: Medicaid Other | Source: Ambulatory Visit | Attending: Radiation Oncology | Admitting: Radiation Oncology

## 2015-02-14 DIAGNOSIS — C711 Malignant neoplasm of frontal lobe: Secondary | ICD-10-CM | POA: Insufficient documentation

## 2015-02-14 DIAGNOSIS — R930 Abnormal findings on diagnostic imaging of skull and head, not elsewhere classified: Secondary | ICD-10-CM | POA: Diagnosis not present

## 2015-02-14 LAB — CREATININE, SERUM
CREATININE: 0.84 mg/dL (ref 0.61–1.24)
GFR calc Af Amer: >60 mL/min (ref >60)
GFR calc non Af Amer: >60 mL/min (ref >60)

## 2015-02-17 ENCOUNTER — Ambulatory Visit
Admission: RE | Admit: 2015-02-17 | Discharge: 2015-02-17 | Disposition: A | Discharge status: Home / Self Care | Payer: Medicaid Other | Source: Ambulatory Visit | Attending: Radiation Oncology | Admitting: Radiation Oncology

## 2015-02-17 ENCOUNTER — Encounter: Payer: Self-pay | Admitting: Radiation Oncology

## 2015-02-17 VITALS — BP 134/70 | HR 76 | Resp 16 | Wt 185.1 lb

## 2015-02-17 DIAGNOSIS — C711 Malignant neoplasm of frontal lobe: Secondary | ICD-10-CM

## 2015-02-17 NOTE — Progress Notes (Signed)
Radiation Oncology         [phone]  Multidisciplinary Brain and Spine Oncology Clinic Follow-Up Visit Note  CC: Jonathan Chance, MD  Ashok Pall, MD    ICD-9-CM ICD-10-CM   1. Astrocytoma of frontal lobe (HCC) 191.1 C71.1 Phenytoin level, total           Diagnosis:   49 yo gentleman with a bifrontal (right greater than left) 8.7 cm grade II astrocytoma s/p radiotherapy 06/18/2013-09/03/2013 to 59.4 Gy   Interval Since Last Radiation:  17  months  Narrative:  The patient returns today for routine follow-up. Denies pain. Reports taking Dilantin 100 mg tid. Denies any seizure activity. Reports nightly headaches for which he takes percocet. Denies nausea, vomiting, or diplopia. Reports occasional ringing in the ears. The last time he saw Dr. Marko Plume was in September 2015.  ALLERGIES:  has No Known Allergies.  Meds: Current Outpatient Prescriptions  Medication Sig Dispense Refill  . metFORMIN (GLUCOPHAGE) 1000 MG tablet Take 1 tablet (1,000 mg total) by mouth daily. 90 tablet 12  . oxyCODONE-acetaminophen (PERCOCET/ROXICET) 5-325 MG tablet Take 1-2 tablets by mouth every 6 (six) hours as needed for severe pain. 90 tablet 0  . phenytoin (DILANTIN) 100 MG ER capsule TAKE 1 CAPSULE BY MOUTH 3 TIMES DAILY 90 capsule 11  . prochlorperazine (COMPAZINE) 10 MG tablet Take 1/2 hour prior to taking temodar daily (Patient not taking: Reported on 04/08/2014) 30 tablet 4   No current facility-administered medications for this encounter.    Physical Findings: The patient is in no acute distress. Patient is alert and oriented. Hair loss where the radiation treatment took place.   weight is 185 lb 1.6 oz (83.961 kg). His blood pressure is 134/70 and his pulse is 76. His respiration is 16 and oxygen saturation is 100%. .  No significant changes.  Lab Findings: Lab Results  Component Value Date   WBC 9.2 11/16/2013   HGB 16.1 11/16/2013   HCT 46.8 11/16/2013   MCV 91.1 11/16/2013   PLT  279 11/16/2013   Radiographic Findings: [name] ZO Contrast  02/14/2015  CLINICAL DATA:  Diffuse astrocytoma.  Followup. EXAM: MRI HEAD WITHOUT AND WITH CONTRAST TECHNIQUE: Multiplanar, multiecho pulse sequences of the brain and surrounding structures were obtained without and with intravenous contrast. COMPARISON:  10/18/2014.  07/12/2014.  04/05/2014. FINDINGS: The brainstem and cerebellum again appear normal. Abnormal T2 and FLAIR signal throughout the white matter of the frontal lobes, right more than left appears the same, with the exception that a small cystic area in the left frontal cortical/ subcortical brain is slightly larger measuring 7 mm as opposed to 4 mm last year. Enhancement along the surgical approach has not changed. A punctate focus of enhancement in the right frontal white matter on axial image 17 is developing. This may have been present in the most minimal fashion on the previous study but is more notable now. Additionally. There is a 3 mm focus of enhancement in the right basal ganglia/ external capsule region that is newly seen. No evidence of increasing mass effect. No hydrocephalus. Low level diffuse dural enhancement remains visible consistent with previous craniotomy. IMPRESSION: No increase in mass effect or increase in the extent of abnormal FLAIR and T2 signal on the right and left frontal regions. Punctate focus of enhancement in the right frontal white matter is becoming more distinct. Newly seen 3 mm focus of enhancement in the right basal ganglia/ external capsule. Electronically Signed   By: Elta Guadeloupe  Shogry M.D.   On: 02/14/2015 11:37   Impression:  The patient is recovering from the effects of radiation and surgery with chronic pain.  His MRI shows no recurrence.  It looks like his dilantin level has note be re-checked since his last dose increase.  Plan: I will order a lab draw on 02/27/15 for Dilantin. MRI in 3 months with follow up after.  This document serves as  a record of services personally performed by [name], MD. It was created on his behalf by Darcus Austin, a trained medical scribe. The creation of this record is based on the scribe's personal observations and the provider's statements to them. This document has been checked and approved by the attending provider. _____________________________________  Sheral Apley. Tammi Klippel, M.D.

## 2015-02-17 NOTE — Progress Notes (Signed)
Weight and vitals stable. Denies pain. Reports taking Dilantin 100 mg tid. Denies any seizure activity. Reports nightly headaches for which he take percocet. Denies nausea, vomiting, or diplopia. Reports occasional ringing in the ears.   BP 134/70 mmHg  Pulse 76  Resp 16  Wt 185 lb 1.6 oz (83.961 kg)  SpO2 100% Wt Readings from Last 3 Encounters:  02/17/15 185 lb 1.6 oz (83.961 kg)  02/10/15 185 lb 11.2 oz (84.233 kg)  10/21/14 185 lb 14.4 oz (84.324 kg)

## 2015-02-18 NOTE — Progress Notes (Signed)
Case discussed with Dr. Patel at time of visit. We reviewed the resident's history and exam and pertinent patient test results. I agree with the assessment, diagnosis, and plan of care documented in the resident's note. 

## 2015-02-25 ENCOUNTER — Telehealth: Payer: Self-pay | Admitting: Radiation Oncology

## 2015-02-25 NOTE — Telephone Encounter (Signed)
Returned patient's call. Patient questioning upcoming appointments. Explained he has an appointment for 02/27/2015 at 1130 to have his Dilantin levels drawn. Explained that after the labs is drawn he is to come to radiation oncology to pick up his Percocet script. Went onto explain his MRI for three months out will be arranged by Mont Dutton after the first of the year and she will contact him with this appointment date and time. Patient verbalized understanding of all reviewed.

## 2015-02-27 ENCOUNTER — Ambulatory Visit
Admission: RE | Admit: 2015-02-27 | Discharge: 2015-02-27 | Disposition: A | Discharge status: Home / Self Care | Payer: Medicaid Other | Source: Ambulatory Visit | Attending: Radiation Oncology | Admitting: Radiation Oncology

## 2015-02-27 ENCOUNTER — Other Ambulatory Visit: Payer: Self-pay | Admitting: Radiation Oncology

## 2015-02-27 DIAGNOSIS — C711 Malignant neoplasm of frontal lobe: Secondary | ICD-10-CM | POA: Insufficient documentation

## 2015-02-27 DIAGNOSIS — T8132XD Disruption of internal operation (surgical) wound, not elsewhere classified, subsequent encounter: Secondary | ICD-10-CM

## 2015-02-27 DIAGNOSIS — G8928 Other chronic postprocedural pain: Secondary | ICD-10-CM | POA: Diagnosis not present

## 2015-02-27 LAB — PHENYTOIN LEVEL, TOTAL: Phenytoin Lvl: 3.1 ug/mL — ABNORMAL LOW (ref 10.0–20.0)

## 2015-02-27 MED ORDER — OXYCODONE-ACETAMINOPHEN 5-325 MG PO TABS: 1.0000 | ORAL_TABLET | Freq: Four times a day (QID) | ORAL | Status: DC | PRN

## 2015-03-03 ENCOUNTER — Telehealth: Payer: Self-pay | Admitting: Radiation Oncology

## 2015-03-03 ENCOUNTER — Telehealth: Payer: Self-pay | Admitting: *Deleted

## 2015-03-03 DIAGNOSIS — C719 Malignant neoplasm of brain, unspecified: Secondary | ICD-10-CM

## 2015-03-03 NOTE — Telephone Encounter (Signed)
CALLED PATIENT TO INFORM OF LAB APPT. ON 03-14-15 @ 1 PM, I ALSO TOLD HIM THAT I CONTACTED DR.AQUINO'S OFFICE FOR AN APPT. WITH HER, I FAXED THE REFERRAL AND AM NOW WAITING ON A RETURN CALL WITH THE APPT. DATE AND TIME, PATIENT CONFIRMED UNDERSTANDING THIS

## 2015-03-03 NOTE — Telephone Encounter (Signed)
Phoned patient informing him his Dilantin levels returned sub optimal. Questioned if patient is taking three pills as prescribed. Patient states, "well truthfully I have only been taking two a day." Stressed he must take three pills per day as prescribed to prevent seizure activity. Patient verbalized understanding. Explained an appointment to re check his Dilantin  levels in two weeks will be arranged as well as a referral to Dr. Delice Lesch. Patient understands Enid Derry will phone with these appointments.

## 2015-03-06 ENCOUNTER — Telehealth: Payer: Self-pay | Admitting: *Deleted

## 2015-03-06 NOTE — Telephone Encounter (Signed)
xxxxx 

## 2015-03-14 ENCOUNTER — Other Ambulatory Visit: Payer: Medicaid Other

## 2015-03-14 ENCOUNTER — Ambulatory Visit: Payer: Medicaid Other

## 2015-03-14 ENCOUNTER — Ambulatory Visit: Payer: Medicaid Other | Attending: Radiation Oncology

## 2015-03-14 ENCOUNTER — Ambulatory Visit: Admission: RE | Admit: 2015-03-14 | Payer: Medicaid Other | Source: Ambulatory Visit

## 2015-03-17 ENCOUNTER — Ambulatory Visit
Admission: RE | Admit: 2015-03-17 | Discharge: 2015-03-17 | Disposition: A | Discharge status: Home / Self Care | Payer: Medicaid Other | Source: Ambulatory Visit | Attending: Radiation Oncology | Admitting: Radiation Oncology

## 2015-03-17 DIAGNOSIS — C711 Malignant neoplasm of frontal lobe: Secondary | ICD-10-CM | POA: Insufficient documentation

## 2015-03-17 DIAGNOSIS — C719 Malignant neoplasm of brain, unspecified: Secondary | ICD-10-CM

## 2015-03-17 LAB — PHENYTOIN LEVEL, TOTAL

## 2015-03-21 ENCOUNTER — Telehealth: Payer: Self-pay | Admitting: *Deleted

## 2015-03-21 ENCOUNTER — Other Ambulatory Visit: Payer: Self-pay | Admitting: Radiation Oncology

## 2015-03-21 DIAGNOSIS — C711 Malignant neoplasm of frontal lobe: Secondary | ICD-10-CM

## 2015-03-21 DIAGNOSIS — G8928 Other chronic postprocedural pain: Secondary | ICD-10-CM

## 2015-03-21 DIAGNOSIS — T8132XD Disruption of internal operation (surgical) wound, not elsewhere classified, subsequent encounter: Secondary | ICD-10-CM

## 2015-03-21 MED ORDER — OXYCODONE-ACETAMINOPHEN 5-325 MG PO TABS: 1.0000 | ORAL_TABLET | Freq: Four times a day (QID) | ORAL | Status: DC | PRN

## 2015-03-21 NOTE — Telephone Encounter (Signed)
Jonathan Barker called to inform that Dr. Tammi Klippel is aware that he needs a refill on his Oxycodone prescription which he stated he wanted to pick up on Monday. Will call him as soon as script available.

## 2015-03-24 ENCOUNTER — Telehealth: Payer: Self-pay | Admitting: Radiation Oncology

## 2015-03-24 ENCOUNTER — Other Ambulatory Visit: Payer: Self-pay | Admitting: Radiation Oncology

## 2015-03-24 DIAGNOSIS — T8132XD Disruption of internal operation (surgical) wound, not elsewhere classified, subsequent encounter: Secondary | ICD-10-CM

## 2015-03-24 DIAGNOSIS — G8928 Other chronic postprocedural pain: Secondary | ICD-10-CM

## 2015-03-24 DIAGNOSIS — C711 Malignant neoplasm of frontal lobe: Secondary | ICD-10-CM

## 2015-03-24 MED ORDER — OXYCODONE-ACETAMINOPHEN 5-325 MG PO TABS: 1.0000 | ORAL_TABLET | Freq: Four times a day (QID) | ORAL | Status: DC | PRN

## 2015-03-24 NOTE — Telephone Encounter (Signed)
Phoned patient making him aware his Percocet script is ready for pick up in the rad onc nursing station. Patient verbalized understanding.

## 2015-04-15 ENCOUNTER — Ambulatory Visit (INDEPENDENT_AMBULATORY_CARE_PROVIDER_SITE_OTHER): Payer: Medicaid Other | Admitting: Neurology

## 2015-04-15 ENCOUNTER — Telehealth: Payer: Self-pay | Admitting: *Deleted

## 2015-04-15 ENCOUNTER — Other Ambulatory Visit: Payer: Self-pay | Admitting: Radiation Oncology

## 2015-04-15 ENCOUNTER — Encounter: Payer: Self-pay | Admitting: Neurology

## 2015-04-15 VITALS — BP 130/82 | HR 83 | Resp 18 | Wt 188.0 lb

## 2015-04-15 DIAGNOSIS — C711 Malignant neoplasm of frontal lobe: Secondary | ICD-10-CM

## 2015-04-15 DIAGNOSIS — T8132XD Disruption of internal operation (surgical) wound, not elsewhere classified, subsequent encounter: Secondary | ICD-10-CM

## 2015-04-15 DIAGNOSIS — G8928 Other chronic postprocedural pain: Secondary | ICD-10-CM

## 2015-04-15 DIAGNOSIS — C719 Malignant neoplasm of brain, unspecified: Secondary | ICD-10-CM | POA: Insufficient documentation

## 2015-04-15 DIAGNOSIS — G40109 Localization-related (focal) (partial) symptomatic epilepsy and epileptic syndromes with simple partial seizures, not intractable, without status epilepticus: Secondary | ICD-10-CM | POA: Diagnosis not present

## 2015-04-15 MED ORDER — PHENYTOIN SODIUM EXTENDED 100 MG PO CAPS: ORAL_CAPSULE | ORAL | Status: DC

## 2015-04-15 MED ORDER — OXYCODONE-ACETAMINOPHEN 5-325 MG PO TABS: 1.0000 | ORAL_TABLET | Freq: Four times a day (QID) | ORAL | Status: DC | PRN

## 2015-04-15 NOTE — Progress Notes (Signed)
NEUROLOGY CONSULTATION NOTE  Jonathan Barker MRN: [id number] DOB: April 18, 1966  Referring provider: Dr. [name]  Primary care provider: Dr. Loleta Chance  Reason for consult:  seizure  Dear Dr Jonathan Barker:  Thank you for your kind referral of Jonathan Barker for consultation of the above symptoms. Although his history is well known to you, please allow me to reiterate it for the purpose of our medical record. The patient was accompanied to the clinic by his daughter who also provides collateral information. Records and images were personally reviewed where available.  HISTORY OF PRESENT ILLNESS: This is a pleasant 49 year old right-handed man who had a new onset seizure last 05/18/13 and was found to have an infiltrative mass in the anteromedial right frontal lobe without enhancement, with FLAIR signal extending across the corpus callosum into the contralateral subcortical left frontal lobe. His daughter reports that he started blinking excessively, he recalls feeling his eyes "blinking at 100 mph," he tried to sit but could not because his left hand was locked inside his pocket. He was witnessed to have generalized shaking for less than a minute. He reports that he recalls the entire event, he could hear people screaming around him. His daughter reports that he was coherent right after the seizure, no post-ictal confusion or focal weakness. He was brought to [facility] where imaging revealed the bifrontal mass, biopsy demonstrated diffuse grade 2 astrocytoma. Wake and drowsy EEG was normal. He took Temodar, then underwent radiotherapy until May 2015. He denies any further seizures since January, but does feel that he may have had a seizure the week after hospital discharge in January 2015, he woke up with his arms flexed, unsure if he had dreamed he had a seizure, no associated tongue bite or incontinence. His wife had mentioned around that time that she felt the bed shaking one night. Since  January 2015, he denies any further eye blinking episodes, generalized shaking or stiffening of extremities, olfactory/gustatory hallucinations, deja vu, rising epigastric sensation, focal numbness/tingling/weakness, myoclonic jerks. He has had low Dilantin in levels in the past, and reports that he had only been taking it twice a day instead of TID, but with most recent level checked in November 2016 (<2.5), he reports taking Dilantin 100mg  TID. He denies any side effects to the medication.  He denies any prior history of headaches until brain biopsy, and since then he has been having frequent headaches. Headaches are over the frontal region, with pressure sensation, no associated nausea, vomiting, photo/phonophobia. He reports headaches occur at the end of the day, 3-4 times a week, with good response to Percocet. He usually takes the medication and goes to bed, with no headache on awakening. He notices more headaches with cold weather. He denies any dizziness, vision changes, dysarthria/dysphagia, neck/back pain, focal numbness/tingling/weakness, bowel/bladder dysfunction. He usually has 4 alcoholic drinks a month. He had a normal birth and early development.  There is no history of febrile convulsions, CNS infections such as meningitis/encephalitis, significant traumatic brain injury, or family history of seizures.  I personally reviewed most recent MRI brain with and without contrast done 02/14/15 which showed abnormal FLAIR signal throughout the white matter of the frontal lobes, right more than left, unchanged, with small cystic area in the left frontal cortical/subcortical region that is noted to be slightly larger (90mm) compared to last year. There is a punctate focus of enhancement in the right frontal white matter, 56mm focus of enhancement in the right basal ganglia/external capsule that  is new.   Laboratory Data:  Lab Results  Component Value Date   WBC 9.2 11/16/2013   HGB 16.1 11/16/2013    HCT 46.8 11/16/2013   MCV 91.1 11/16/2013   PLT 279 11/16/2013     Chemistry      Component Value Date/Time   NA 139 01/16/2014 1035   NA 141 05/21/2013 0510   K 4.1 01/16/2014 1035   K 4.0 05/21/2013 0510   CL 105 05/21/2013 0510   CO2 24 01/16/2014 1035   CO2 24 05/21/2013 0510   BUN 11.6 01/16/2014 1035   BUN 14 05/21/2013 0510   CREATININE 0.84 02/14/2015 0955   CREATININE 1.1 01/16/2014 1035      Component Value Date/Time   CALCIUM 9.5 01/16/2014 1035   CALCIUM 8.9 05/21/2013 0510   ALKPHOS 83 11/16/2013 0921   ALKPHOS 89 05/18/2013 1908   AST 23 11/16/2013 0921   AST 24 05/18/2013 1908   ALT 35 11/16/2013 0921   ALT 28 05/18/2013 1908   BILITOT 0.27 11/16/2013 0921   BILITOT 0.2* 05/18/2013 1908     Lab Results  Component Value Date   PHENYTOIN <2.5* 03/17/2015    PAST MEDICAL HISTORY: Past Medical History  Diagnosis Date  . Shortness of breath   . DM (diabetes mellitus) ([place]) 08/09/2011    A1c at diagnosis = 12.4  . GERD (gastroesophageal reflux disease) 08/09/2011  . Seizure ([place]) 05/19/2013  . Astrocytoma of frontal lobe ([employer]) 05/31/2013    PAST SURGICAL HISTORY: Past Surgical History  Procedure Laterality Date  . Appendectomy    . Brain biopsy N/A 05/24/2013    Procedure: Craniotomy for open biopsy;  Surgeon: Winfield Cunas, MD;  Location: [place] [facility];  Service: Neurosurgery;  Laterality: N/A;  Craniotomy for open biopsy    MEDICATIONS: Current Outpatient Prescriptions on File Prior to Visit  Medication Sig Dispense Refill  . metFORMIN (GLUCOPHAGE) 1000 MG tablet Take 1 tablet (1,000 mg total) by mouth daily. 90 tablet 12  . oxyCODONE-acetaminophen (PERCOCET/ROXICET) 5-325 MG tablet Take 1-2 tablets by mouth every 6 (six) hours as needed for severe pain. 90 tablet 0  . phenytoin (DILANTIN) 100 MG ER capsule TAKE 1 CAPSULE BY MOUTH 3 TIMES DAILY 90 capsule 11  . prochlorperazine (COMPAZINE) 10 MG tablet Take 1/2 hour prior to taking temodar daily  (Patient not taking: Reported on 04/08/2014) 30 tablet 4   No current facility-administered medications on file prior to visit.    ALLERGIES: No Known Allergies  FAMILY HISTORY: Family History  Problem Relation Age of Onset  . Breast cancer Mother   . Diabetes Father   . Heart disease Father   . Diabetes Paternal Uncle   . Diabetes Brother     SOCIAL HISTORY: Social History   Social History  . Marital Status: Significant Other    Spouse Name: N/A  . Number of Children: 2  . Years of Education: N/A   Occupational History  .     Social History Main Topics  . Smoking status: Current Every Day Smoker -- 1.00 packs/day for 33 years    Types: Cigarettes  . Smokeless tobacco: Never Used     Comment: Trying to quit. Down to 1 ppd, 10/11/13 down to 1/2 ppd  . Alcohol Use: 0.0 oz/week    0 Standard drinks or equivalent per week     Comment: social  . Drug Use: Yes    Special: Marijuana     Comment: Marijuana daily  .  Sexual Activity: Not on file   Other Topics Concern  . Not on file   Social History Narrative   [place]. He does not work now.     REVIEW OF SYSTEMS: Constitutional: No fevers, chills, or sweats, no generalized fatigue, change in appetite Eyes: No visual changes, double vision, eye pain Ear, nose and throat: No hearing loss, ear pain, nasal congestion, sore throat Cardiovascular: No chest pain, palpitations Respiratory:  No shortness of breath at rest or with exertion, wheezes GastrointestinaI: No nausea, vomiting, diarrhea, abdominal pain, fecal incontinence Genitourinary:  No dysuria, urinary retention or frequency Musculoskeletal:  No neck pain, back pain Integumentary: No rash, pruritus, skin lesions Neurological: as above Psychiatric: No depression, insomnia, anxiety Endocrine: No palpitations, fatigue, diaphoresis, mood swings, change in appetite, change in weight, increased thirst Hematologic/Lymphatic:  No anemia,  purpura, petechiae. Allergic/Immunologic: no itchy/runny eyes, nasal congestion, recent allergic reactions, rashes  PHYSICAL EXAM: Filed Vitals:   04/15/15 0820  BP: 130/82  Pulse: 83  Resp: 18   General: No acute distress Head:  Normocephalic/atraumatic Eyes: Fundoscopic exam shows bilateral sharp discs, no vessel changes, exudates, or hemorrhages Neck: supple, no paraspinal tenderness, full range of motion Back: No paraspinal tenderness Heart: regular rate and rhythm Lungs: Clear to auscultation bilaterally. Vascular: No carotid bruits. Skin/Extremities: No rash, no edema Neurological Exam: Mental status: alert and oriented to person, place, and time, no dysarthria or aphasia, Fund of knowledge is appropriate.  Remote memory intact. 1/3 delayed recall. Attention and concentration are normal.    Able to name objects and repeat phrases. Cranial nerves: CN I: not tested CN II: pupils equal, round and reactive to light, visual fields intact, fundi unremarkable. CN III, IV, VI:  full range of motion, no nystagmus, no ptosis CN V: facial sensation intact CN VII: upper and lower face symmetric CN VIII: hearing intact to finger rub CN IX, X: gag intact, uvula midline CN XI: sternocleidomastoid and trapezius muscles intact CN XII: tongue midline Bulk & Tone: normal, no fasciculations. Motor: 5/5 throughout with no pronator drift. Sensation: intact to light touch, cold, pin, vibration and joint position sense.  No extinction to double simultaneous stimulation.  Romberg test negative Deep Tendon Reflexes: +2 throughout, no ankle clonus Plantar responses: downgoing bilaterally Cerebellar: no incoordination on finger to nose, heel to shin. No dysdiadochokinesia Gait: narrow-based and steady, able to tandem walk adequately. Tremor: none  IMPRESSION: This is a pleasant 49 year old right-handed man with a history of new onset seizure last January 2015 during which he was found to have a  bifrontal (right greater than left) grade II astrocytoma. He underwent Temodar treatment and radiotherapy, follow-up imaging October 2016 has been overall stable, there is note of a punctate focus of enhancement in the right frontal white matter and newly seen 81mm focus of enhancement in the right basal ganglia/external capsule. He has been taking Dilantin since January 2015, although initially taking it BID instead of TID. He reports taking Dilantin 100mg  TID with most recent subtherapeutic level of <2.5. He denies any further seizures or seizure-like symptoms since January 2015. We discussed his options, including option of switching to a different seizure medication, which he is very hesitant to do. We discussed the importance of medication compliance, and that if he has been seizure-free, as long as he is taking medication as instructed, we would follow him clinically instead of following levels. He has been having difficulty with TID dosing, and will start taking  Dilantin 100mg  3 capsules every morning. A repeat Dilantin level will be done in a week. [place] driving laws were discussed with the patient, and he knows to stop driving after a seizure, until 6 months seizure-free. We discussed avoidance of seizure triggers, including missing medication, alcohol, and sleep deprivation. He will follow-up in 3 months and knows to call for any changes.   Thank you for allowing me to participate in the care of this patient. Please do not hesitate to call for any questions or concerns.   Ellouise Newer, M.D.  CC: Dr. Tammi Barker, Dr. Melburn Hake

## 2015-04-15 NOTE — Telephone Encounter (Signed)
Called patient to inform that script is ready for pick-up, patient is aware

## 2015-04-15 NOTE — Patient Instructions (Signed)
1. Start taking Phenytoin 100mg  3 capsules every morning 2. After 1 week, check Dilantin level  3. Follow-up in 3 months, call for any problems  Seizure Precautions: 1. If medication has been prescribed for you to prevent seizures, take it exactly as directed.  Do not stop taking the medicine without talking to your doctor first, even if you have not had a seizure in a long time.   2. Avoid activities in which a seizure would cause danger to yourself or to others.  Don't operate dangerous machinery, swim alone, or climb in high or dangerous places, such as on ladders, roofs, or girders.  Do not drive unless your doctor says you may.  3. If you have any warning that you may have a seizure, lay down in a safe place where you can't hurt yourself.    4.  No driving for 6 months from last seizure, as per [facility].   Please refer to the following link on the [facility] website for more information: http://[email or web address].org/answerplace/Social/driving/drivingu.cfm   5.  Maintain good sleep hygiene. Avoid alcohol.  6.  Contact your doctor if you have any problems that may be related to the medicine you are taking.  7.  Call 911 and bring the patient back to the ED if:        A.  The seizure lasts longer than 5 minutes.       B.  The patient doesn't awaken shortly after the seizure  C.  The patient has new problems such as difficulty seeing, speaking or moving  D.  The patient was injured during the seizure  E.  The patient has a temperature over 102 F (39C)  F.  The patient vomited and now is having trouble breathing

## 2015-04-22 ENCOUNTER — Telehealth: Payer: Self-pay | Admitting: Radiation Oncology

## 2015-04-22 ENCOUNTER — Ambulatory Visit
Admission: RE | Admit: 2015-04-22 | Discharge: 2015-04-22 | Disposition: A | Discharge status: Home / Self Care | Payer: Medicaid Other | Source: Ambulatory Visit | Attending: Radiation Oncology | Admitting: Radiation Oncology

## 2015-04-22 ENCOUNTER — Other Ambulatory Visit: Payer: Self-pay | Admitting: Radiation Oncology

## 2015-04-22 DIAGNOSIS — C711 Malignant neoplasm of frontal lobe: Secondary | ICD-10-CM

## 2015-04-22 LAB — PHENYTOIN LEVEL, TOTAL: Phenytoin Lvl: <2.5 ug/mL — ABNORMAL LOW (ref 10.0–20.0)

## 2015-04-22 NOTE — Telephone Encounter (Signed)
Phoned Tiffany, RN for Dr. Ellouise Newer making her aware the patient's Dilantin level from today is low. Tiffany, RN verbalized understanding and expressed her intention to inform Dr. Delice Lesch.

## 2015-04-23 ENCOUNTER — Ambulatory Visit: Payer: Self-pay | Admitting: Internal Medicine

## 2015-04-23 NOTE — Telephone Encounter (Signed)
Discussed low Dilantin level with patient. He reports compliance to medication, now taking all 3 tablets in AM. No seizures or seizure-like symptoms. Discussed that we will follow him clinically, since he has been seizure-free, would not change dose, but he needs to make sure to take medication regularly. If he starts having any seizure-like symptoms, he knows to call our office at which point dose will be increased.

## 2015-04-24 ENCOUNTER — Ambulatory Visit: Payer: Self-pay | Admitting: Internal Medicine

## 2015-04-29 ENCOUNTER — Other Ambulatory Visit: Payer: Self-pay | Admitting: Radiation Therapy

## 2015-04-29 DIAGNOSIS — C711 Malignant neoplasm of frontal lobe: Secondary | ICD-10-CM

## 2015-05-12 ENCOUNTER — Encounter: Payer: Self-pay | Admitting: Internal Medicine

## 2015-05-14 ENCOUNTER — Other Ambulatory Visit: Payer: Self-pay | Admitting: Radiation Oncology

## 2015-05-14 DIAGNOSIS — T8132XD Disruption of internal operation (surgical) wound, not elsewhere classified, subsequent encounter: Secondary | ICD-10-CM

## 2015-05-14 DIAGNOSIS — C711 Malignant neoplasm of frontal lobe: Secondary | ICD-10-CM

## 2015-05-14 DIAGNOSIS — G8928 Other chronic postprocedural pain: Secondary | ICD-10-CM

## 2015-05-14 MED ORDER — OXYCODONE-ACETAMINOPHEN 5-325 MG PO TABS: 1.0000 | ORAL_TABLET | Freq: Four times a day (QID) | ORAL | Status: DC | PRN

## 2015-05-16 ENCOUNTER — Encounter: Payer: Self-pay | Admitting: Radiation Oncology

## 2015-05-16 ENCOUNTER — Ambulatory Visit (HOSPITAL_COMMUNITY): Payer: Medicaid Other

## 2015-05-16 ENCOUNTER — Ambulatory Visit
Admission: RE | Admit: 2015-05-16 | Discharge: 2015-05-16 | Disposition: A | Discharge status: Home / Self Care | Payer: Medicaid Other | Source: Ambulatory Visit | Attending: Radiation Oncology | Admitting: Radiation Oncology

## 2015-05-16 DIAGNOSIS — C711 Malignant neoplasm of frontal lobe: Secondary | ICD-10-CM

## 2015-05-16 MED FILL — OXYCODONE/APAP 5/325MG: 5-325 | 11 days supply | Qty: 90 | Fill #0

## 2015-05-16 NOTE — Progress Notes (Signed)
Weight and vitals stable. Denies pain. Reports headaches at night which he manages Percocet. Denies nausea, vomiting, diplopia or ringing in the ears. Denies dizziness. Denies fatigue.   BP 136/86 mmHg  Pulse 87  Resp 16  Wt 188 lb 11.2 oz (85.594 kg)  SpO2 100% Wt Readings from Last 3 Encounters:  05/16/15 188 lb 11.2 oz (85.594 kg)  04/15/15 188 lb (85.276 kg)  02/17/15 185 lb 1.6 oz (83.961 kg)

## 2015-05-16 NOTE — Progress Notes (Signed)
  Radiation Oncology         [phone]  Multidisciplinary Brain and Spine Oncology Clinic Follow-Up Visit Note  CC: Loleta Chance, MD  Ashok Pall, MD    ICD-9-CM ICD-10-CM   1. Astrocytoma of frontal lobe (HCC) 191.1 C71.1   2. Localization-related symptomatic epilepsy and epileptic syndromes with simple partial seizures, not intractable, without status epilepticus ([place]) 345.50 G40.109            Diagnosis:   50 yo gentleman with a bifrontal (right greater than left) 8.7 cm grade II astrocytoma s/p radiotherapy 06/18/2013-09/03/2013 to 59.4 Gy   Interval Since Last Radiation: 20  months  Narrative:  The patient returns today for routine follow-up. Weight and vitals stable. Denies pain. Reports headaches at night which he manages Percocet. Denies nausea, vomiting, diplopia or ringing in the ears. Denies dizziness. Denies fatigue.   ALLERGIES:  has No Known Allergies.  Meds: Current Outpatient Prescriptions  Medication Sig Dispense Refill  . metFORMIN (GLUCOPHAGE) 1000 MG tablet Take 1 tablet (1,000 mg total) by mouth daily. 90 tablet 12  . oxyCODONE-acetaminophen (PERCOCET/ROXICET) 5-325 MG tablet Take 1-2 tablets by mouth every 6 (six) hours as needed for severe pain. 90 tablet 0  . phenytoin (DILANTIN) 100 MG ER capsule Take 3 capsules every morning 90 capsule 11  . prochlorperazine (COMPAZINE) 10 MG tablet Take 1/2 hour prior to taking temodar daily (Patient not taking: Reported on 04/08/2014) 30 tablet 4   No current facility-administered medications for this encounter.    Physical Findings: The patient is in no acute distress. Patient is alert and oriented. Hair loss where the radiation treatment took place.   No significant changes.   Lab Findings: Lab Results  Component Value Date   WBC 9.2 11/16/2013   HGB 16.1 11/16/2013   HCT 46.8 11/16/2013   MCV 91.1 11/16/2013   PLT 279 11/16/2013   Radiographic Findings: No results found.   Impression: Patient has  been having headaches and would benefit from re-staging MRI in accordance with NCCN guidelines shown below..   Plan: We will go ahead and obtain a brain MRI for re-staging and follow-up on the findings.  _____________________________________  Sheral Apley Tammi Klippel, M.D. This document serves as a record of services personally performed by [name], MD. It was created on his behalf by Jenell Milliner, a trained medical scribe. The creation of this record is based on the scribe's personal observations and the provider's statements to them. This document has been checked and approved by the attending provider.         This document serves as a record of services personally performed by [name], MD. It was created on his behalf by Jenell Milliner, a trained medical scribe. The creation of this record is based on the scribe's personal observations and the provider's statements to them. This document has been checked and approved by the attending provider.

## 2015-05-19 ENCOUNTER — Ambulatory Visit: Admission: RE | Admit: 2015-05-19 | Payer: Medicaid Other | Source: Ambulatory Visit | Admitting: Radiation Oncology

## 2015-05-19 ENCOUNTER — Ambulatory Visit (HOSPITAL_COMMUNITY): Payer: Medicaid Other

## 2015-05-19 ENCOUNTER — Ambulatory Visit: Payer: Self-pay | Admitting: Radiation Oncology

## 2015-05-22 ENCOUNTER — Ambulatory Visit: Payer: Self-pay | Admitting: Radiation Oncology

## 2015-05-23 ENCOUNTER — Ambulatory Visit (HOSPITAL_COMMUNITY): Admission: RE | Admit: 2015-05-23 | Payer: Medicaid Other | Source: Ambulatory Visit

## 2015-05-26 ENCOUNTER — Encounter: Payer: Self-pay | Admitting: Internal Medicine

## 2015-05-26 ENCOUNTER — Ambulatory Visit (INDEPENDENT_AMBULATORY_CARE_PROVIDER_SITE_OTHER): Payer: Medicaid Other | Admitting: Internal Medicine

## 2015-05-26 VITALS — BP 130/91 | HR 110 | Temp 97.5°F | Ht 71.5 in | Wt 190.6 lb

## 2015-05-26 DIAGNOSIS — Z7984 Long term (current) use of oral hypoglycemic drugs: Secondary | ICD-10-CM | POA: Diagnosis not present

## 2015-05-26 DIAGNOSIS — Z923 Personal history of irradiation: Secondary | ICD-10-CM | POA: Diagnosis not present

## 2015-05-26 DIAGNOSIS — Z720 Tobacco use: Secondary | ICD-10-CM

## 2015-05-26 DIAGNOSIS — E119 Type 2 diabetes mellitus without complications: Secondary | ICD-10-CM

## 2015-05-26 DIAGNOSIS — F17200 Nicotine dependence, unspecified, uncomplicated: Secondary | ICD-10-CM | POA: Diagnosis not present

## 2015-05-26 DIAGNOSIS — C711 Malignant neoplasm of frontal lobe: Secondary | ICD-10-CM

## 2015-05-26 DIAGNOSIS — E1121 Type 2 diabetes mellitus with diabetic nephropathy: Secondary | ICD-10-CM

## 2015-05-26 DIAGNOSIS — Z0000 Encounter for general adult medical examination without abnormal findings: Secondary | ICD-10-CM

## 2015-05-26 DIAGNOSIS — Z9221 Personal history of antineoplastic chemotherapy: Secondary | ICD-10-CM | POA: Diagnosis not present

## 2015-05-26 LAB — POCT GLYCOSYLATED HEMOGLOBIN (HGB A1C): HEMOGLOBIN A1C: 7.1

## 2015-05-26 LAB — GLUCOSE, CAPILLARY: GLUCOSE-CAPILLARY: 245 mg/dL — AB (ref 65–99)

## 2015-05-26 MED ORDER — NICOTINE POLACRILEX 4 MG MT GUM: 4.0000 mg | CHEWING_GUM | OROMUCOSAL | Status: DC | PRN

## 2015-05-26 NOTE — Assessment & Plan Note (Signed)
He is smoking 1 pack per day, and is very interested in quitting. He was particularly interested in Nicorette, which I prescribed today. At the next visit, we can discuss starting bupropion if he is interested.

## 2015-05-26 NOTE — Assessment & Plan Note (Signed)
He declined getting his flu shot and retinal exam today.

## 2015-05-26 NOTE — Assessment & Plan Note (Signed)
His diabetes is well controlled on diet and metformin alone. His A1c today was 7.1. He did not want to get a retinal exam today. He checks his feet nightly, and has not had any sores on the bottoms.

## 2015-05-26 NOTE — Patient Instructions (Signed)
Mr. Tae,  It was great meeting you today. You doing an outstanding job controlling her diabetes. I don't think we need to make any changes today. As we discussed, quitting smoking is hands down the best thing you can do for your health. I prescribed you some Nicorette gum; take this whenever you feel the urge to smoke. If you want to try something else going forward, we have other medications such as bupropion the can help as well.  Take care, and I'll see you in 3 months, Dr. Melburn Hake

## 2015-05-26 NOTE — Assessment & Plan Note (Addendum)
He was diagnosed with grade 2 astrocytoma in January 2015. He is status post radiation and chemotherapy, and is followed closely by radiation oncology where he gets MRIs every 3-6 months. His last MRI in October did not show any progression.He had seizures in the past, but has not had any since starting phenytoin. He sometimes get headaches and night that are relieved by oxycodone. Otherwise, he denies any changes in vision, focal weakness, dizziness, change in his personality, or other complaints. Notably, the five-year survival for patients with grade 2 astrocytoma that undergo radiation chemotherapy is about 70%.

## 2015-05-26 NOTE — Progress Notes (Signed)
Patient ID: Jonathan Barker, male   DOB: [birth year], 50 y.o.   MRN: [id number] [facility] Subjective:   Patient ID: Jonathan Barker male   DOB: 1966-02-15 50 y.o.   MRN: [id number]  HPI: [name] is a 50 y.o. male with a history of non-insulin-dependent type 2 diabetes, and astrocytoma of the frontal lobe status post radioactive therapy in 2015, and tobacco abuse presenting to clinic for follow-up of his diabetes and tobacco abuse.  Type 2 diabetes: He is drinking 1-2 Mountain Dew's per day, and is taking his metformin as prescribed without any issues.  Tobacco abuse: He is smoking 1 pack-per-day and is interested in quitting. He was told at the cancer center he can't get the nicotine patch but he'd like to try the gum.  Bifrontal astrocytoma: He has nightly headaches for which he takes oxycodone about twice per week. He had seizures in the past, but has not had any since starting phenytoin.He is followed closely by radiation oncology.  I have reviewed his medications this visit.  Review of Systems: Review of Systems  Constitutional: Negative for fever, chills, weight loss and malaise/fatigue.  Eyes: Negative for blurred vision and double vision.  Respiratory: Positive for wheezing. Negative for cough and shortness of breath.   Cardiovascular: Negative for chest pain and palpitations.  Gastrointestinal: Positive for heartburn. Negative for nausea, vomiting and abdominal pain.  Neurological: Positive for headaches. Negative for dizziness, sensory change, speech change, seizures and loss of consciousness.    Objective:  Physical Exam: Filed Vitals:   05/26/15 1341  BP: 130/91  Pulse: 110  Temp: 97.5 F (36.4 C)  TempSrc: Oral  Height: 5' 11.5" (1.816 m)  Weight: 190 lb 9.6 oz (86.456 kg)  SpO2: 97%   General: very nice man with a beard sitting comfortably, joking around HEENT: no scleral icterus, extra-ocular muscles intact, oropharynx  without lesions Cardiac: regular rate and rhythm, no rubs, murmurs or gallops Pulm: breathing well, some end expiratory wheezes in the upper fields Abd: bowel sounds normal, soft, nondistended, non-tender Ext: warm and well perfused, without pedal edema Lymph: no cervical or supraclavicular lymphadenopathy Skin: no rash, hair, or nail changes Neuro: alert and oriented X3, cranial nerves II-XII grossly intact, moving all extremities well  Assessment & Plan:  Case discussed with Dr. Eppie Gibson  Diabetes type 2, controlled ([place]) His diabetes is well controlled on diet and metformin alone. His A1c today was 7.1. He did not want to get a retinal exam today. He checks his feet nightly, and has not had any sores on the bottoms.  Astrocytoma of frontal lobe He was diagnosed with grade 2 astrocytoma in January 2015. He is status post radiation and chemotherapy, and is followed closely by radiation oncology where he gets MRIs every 3-6 months. His last MRI in October did not show any progression.He had seizures in the past, but has not had any since starting phenytoin. He sometimes get headaches and night that are relieved by oxycodone. Otherwise, he denies any changes in vision, focal weakness, dizziness, change in his personality, or other complaints. Notably, the five-year survival for patients with grade 2 astrocytoma that undergo radiation chemotherapy is about 70%.  Tobacco abuse He is smoking 1 pack per day, and is very interested in quitting. He was particularly interested in Nicorette, which I prescribed today. At the next visit, we can discuss starting bupropion if he is interested.  Healthcare maintenance He declined getting his flu shot and retinal  exam today.   Medications Ordered Meds ordered this encounter  Medications  . nicotine polacrilex (NICORETTE) 4 MG gum    Sig: Take 1 each (4 mg total) by mouth as needed for smoking cessation.    Dispense:  100 tablet    Refill:  0   Other  Orders Orders Placed This Encounter  Procedures  . Glucose, capillary  . POCT HgB A1C (CPT [id number])   Follow Up: Return in about 3 months (around 08/24/2015) for diabetes follow up.

## 2015-05-27 ENCOUNTER — Ambulatory Visit (HOSPITAL_COMMUNITY)
Admission: RE | Admit: 2015-05-27 | Discharge: 2015-05-27 | Disposition: A | Discharge status: Home / Self Care | Payer: Medicaid Other | Source: Ambulatory Visit | Attending: Radiation Oncology | Admitting: Radiation Oncology

## 2015-05-27 DIAGNOSIS — C711 Malignant neoplasm of frontal lobe: Secondary | ICD-10-CM | POA: Insufficient documentation

## 2015-05-27 LAB — POCT I-STAT CREATININE: CREATININE: 0.9 mg/dL (ref 0.61–1.24)

## 2015-05-27 MED ORDER — GADOBENATE DIMEGLUMINE 529 MG/ML IV SOLN
20.0000 mL | Freq: Once | INTRAVENOUS | Status: AC | PRN
  Administered 2015-05-27: 17 mL via INTRAVENOUS

## 2015-05-27 NOTE — Progress Notes (Signed)
Case discussed with Dr. Flores at the time of the visit. We reviewed the resident's history and exam and pertinent patient test results. I agree with the assessment, diagnosis, and plan of care documented in the resident's note. 

## 2015-05-28 ENCOUNTER — Ambulatory Visit: Payer: Self-pay | Admitting: Radiation Oncology

## 2015-05-28 ENCOUNTER — Encounter: Payer: Self-pay | Admitting: Radiation Oncology

## 2015-05-28 ENCOUNTER — Ambulatory Visit
Admission: RE | Admit: 2015-05-28 | Discharge: 2015-05-28 | Disposition: A | Discharge status: Home / Self Care | Payer: Medicaid Other | Source: Ambulatory Visit | Attending: Radiation Oncology | Admitting: Radiation Oncology

## 2015-05-28 VITALS — BP 126/97 | HR 111 | Resp 16 | Wt 188.9 lb

## 2015-05-28 DIAGNOSIS — T8132XD Disruption of internal operation (surgical) wound, not elsewhere classified, subsequent encounter: Secondary | ICD-10-CM

## 2015-05-28 DIAGNOSIS — C711 Malignant neoplasm of frontal lobe: Secondary | ICD-10-CM

## 2015-05-28 DIAGNOSIS — G8928 Other chronic postprocedural pain: Secondary | ICD-10-CM

## 2015-05-28 NOTE — Progress Notes (Signed)
Weight and vitals stable. Denies pain. Reports headaches at night which he manages Percocet. Denies nausea, vomiting, diplopia or ringing in the ears. Denies dizziness. Denies fatigue.   BP 126/97 mmHg  Pulse 111  Resp 16  Wt 188 lb 14.4 oz (85.684 kg)  SpO2 100% Wt Readings from Last 3 Encounters:  05/28/15 188 lb 14.4 oz (85.684 kg)  05/27/15 188 lb (85.276 kg)  05/26/15 190 lb 9.6 oz (86.456 kg)

## 2015-05-28 NOTE — Progress Notes (Signed)
Radiation Oncology         [phone]  Multidisciplinary Brain and Spine Oncology Clinic Follow-Up Visit Note  CC: Loleta Chance, MD  Ashok Pall, MD  No diagnosis found.         Diagnosis:   50 yo gentleman with a bifrontal (right greater than left) 8.7 cm grade II astrocytoma s/p radiotherapy 06/18/2013-09/03/2013 to 59.4 Gy  Interval Since Last Radiation: 20  months  Narrative:  The patient returns today for routine follow-up. Weight and vitals stable. Denies pain. Reports headaches at night which he manages Percocet. Denies nausea, vomiting, diplopia or ringing in the ears. Denies dizziness. Denies fatigue.  ALLERGIES:  has No Known Allergies.  Meds: Current Outpatient Prescriptions  Medication Sig Dispense Refill  . metFORMIN (GLUCOPHAGE) 1000 MG tablet Take 1 tablet (1,000 mg total) by mouth daily. 90 tablet 12  . nicotine polacrilex (NICORETTE) 4 MG gum Take 1 each (4 mg total) by mouth as needed for smoking cessation. 100 tablet 0  . oxyCODONE-acetaminophen (PERCOCET/ROXICET) 5-325 MG tablet Take 1-2 tablets by mouth every 6 (six) hours as needed for severe pain. 90 tablet 0  . phenytoin (DILANTIN) 100 MG ER capsule Take 3 capsules every morning 90 capsule 11  . prochlorperazine (COMPAZINE) 10 MG tablet Take 1/2 hour prior to taking temodar daily (Patient not taking: Reported on 04/08/2014) 30 tablet 4   No current facility-administered medications for this encounter.    Physical Findings: The patient is in no acute distress. Patient is alert and oriented. Hair loss where the radiation treatment took place.   No significant changes.   Lab Findings: Lab Results  Component Value Date   WBC 9.2 11/16/2013   HGB 16.1 11/16/2013   HCT 46.8 11/16/2013   MCV 91.1 11/16/2013   PLT 279 11/16/2013   Radiographic Findings: [name] [id number] Contrast  05/28/2015  ADDENDUM REPORT: 05/28/2015 10:37 ADDENDUM: Study discussed by telephone with Dr. [name] on 05/28/2015  at 10:37 . Electronically Signed   By: Genevie Ann M.D.   On: 05/28/2015 10:37  05/28/2015  CLINICAL DATA:  50 year old male with right greater than left bifrontal grade 2 astrocytoma status post radiotherapy from February to May 2015. Restaging. Subsequent encounter. EXAM: MRI HEAD WITHOUT AND WITH CONTRAST TECHNIQUE: Multiplanar, multiecho pulse sequences of the brain and surrounding structures were obtained without and with intravenous contrast. CONTRAST:  47mL MULTIHANCE GADOBENATE DIMEGLUMINE 529 MG/ML IV SOLN COMPARISON:  02/14/2015 and earlier. FINDINGS: Mild postoperative changes along the superior right frontal bone related to prior biopsy. Underlying small cystic post biopsy changes in the anterior superior right frontal gyrus in the area of chronically abnormal gray and white matter T2 and FLAIR hyperintensity (series 7, images 19 and 20). The anterior right frontal lobe involvement extends inferiorly nearly to the gyrus rectus as before. The extent of anterior right frontal lobe T2/ FLAIR signal abnormality is stable since 07/12/2014. There is chronic contralateral extension via the anterior body of the corpus callosum. The corpus callosum and contralateral left periventricular white matter T2/FLAIR involvement over the anterior body of the left lateral ventricle is stable since 07/12/2014. However, subtle increased FLAIR hyperintensity more posteriorly at the mid left corona radiata (series 7, image 15) appears mildly increased since 07/12/2014. As before, diffusion is facilitated throughout most of the affected area. Following contrast there is a 4-5 mm focus of enhancement within the central aspect of the abnormal right anterior frontal lobe white matter seen on series 10, image 36. This  demonstrates mildly restricted diffusion (series 400, image 34). This was subtle in 2016 until the October study, and appears further increased, more distinct since that time (series 10, image 36 today versus series 11,  image 33 on 02/14/2015). No other suspicious enhancement is identified. Right basal ganglia enhancement today appears to be vascular related, physiologic. There is a punctate focus of enhancement in the right dorsal pons (series 10, image 18) which is unchanged over this series of exams. No restricted diffusion to suggest acute infarction. No midline shift, mass effect, evidence of mass lesion, ventriculomegaly, extra-axial collection or acute intracranial hemorrhage. Cervicomedullary junction and pituitary are within normal limits. Negative visualized cervical spine. Major intracranial vascular flow voids are stable and within normal limits. Visible internal auditory structures appear normal. Mastoids are clear. Stable mild paranasal sinus mucosal thickening. Stable orbit and scalp soft tissues. Normal bone marrow signal. IMPRESSION: 1. Chronic right greater than left bifrontal infiltrative glioma. This has been primarily nonenhancing (low-grade), however, there is an Increasing small 4-5 mm focus of Nodular Enhancement in the right frontal lobe (series 10, image 36) with mildly restricted diffusion suspicious for a Small Area of Hypercellular At Least Grade 3 Tumor. 2. Otherwise stable post treatment appearance. Attention directed on followup to subtle left corona radiata FLAIR hyperintensity (series 7, image 15) which appears mildly increased since 07/12/2014. Electronically Signed: By: Genevie Ann M.D. On: 05/28/2015 07:44     Impression: Patient has been having headaches and would benefit from re-staging MRI in accordance with NCCN guidelines shown below.. There was a 4 mm spot on his last MRI.  Plan: His mother had breast cancer in her 69's, but he denies any other cancer in his family. I suggest he gets genetic counseling performed to check if this is genetic-related. He is interested in genetic counseling so I made a referral for him to meet with them. He will be scheduled for an MRI in three months since  there was a 4 mm spot on his last MRI and will follow up with me following that scan. I refilled his Percocet, as well.  _____________________________________  Sheral Apley Tammi Klippel, M.D.       This document serves as a record of services personally performed by [name], MD. It was created on his behalf by Lendon Collar, a trained medical scribe. The creation of this record is based on the scribe's personal observations and the provider's statements to them. This document has been checked and approved by the attending provider.

## 2015-05-29 ENCOUNTER — Telehealth: Payer: Self-pay | Admitting: Genetic Counselor

## 2015-05-29 NOTE — Telephone Encounter (Signed)
PT CONFIRMED APPT FOR 06/16/15 AT 9AM; MAILED OUT A NEW PT PACKET

## 2015-06-03 ENCOUNTER — Ambulatory Visit (HOSPITAL_COMMUNITY): Payer: Medicaid Other

## 2015-06-04 ENCOUNTER — Ambulatory Visit: Payer: Self-pay | Admitting: Radiation Oncology

## 2015-06-09 ENCOUNTER — Other Ambulatory Visit: Payer: Self-pay | Admitting: Radiation Therapy

## 2015-06-09 MED FILL — PHENYTOIN SOD EXT 100 MG CA: 100 | 30 days supply | Qty: 90 | Fill #6

## 2015-06-13 MED FILL — OXYCODONE/APAP 5/325MG: 5-325 | 8 days supply | Qty: 90 | Fill #0

## 2015-06-16 ENCOUNTER — Ambulatory Visit (HOSPITAL_BASED_OUTPATIENT_CLINIC_OR_DEPARTMENT_OTHER): Payer: Medicaid Other | Admitting: Genetic Counselor

## 2015-06-16 ENCOUNTER — Encounter: Payer: Self-pay | Admitting: Genetic Counselor

## 2015-06-16 ENCOUNTER — Other Ambulatory Visit: Payer: Medicaid Other

## 2015-06-16 DIAGNOSIS — Z803 Family history of malignant neoplasm of breast: Secondary | ICD-10-CM | POA: Diagnosis not present

## 2015-06-16 DIAGNOSIS — C711 Malignant neoplasm of frontal lobe: Secondary | ICD-10-CM

## 2015-06-16 NOTE — Progress Notes (Signed)
REFERRING PROVIDER: Loleta Chance, MD [place], [place] [address]   [name], MD  PRIMARY PROVIDER:  Loleta Chance, MD  PRIMARY REASON FOR VISIT:  1. Astrocytoma of frontal lobe ([place])   2. Family history of breast cancer      HISTORY OF PRESENT ILLNESS:   Mr. Soulliere, a 50 y.o. male, was seen for a [place] cancer genetics consultation at the request of Dr. Tammi Klippel due to a personal and family history of cancer.  Mr. Mutchler presents to clinic today, with his daughter, to discuss the possibility of a hereditary predisposition to cancer, genetic testing, and to further clarify his future cancer risks, as well as potential cancer risks for family members.   In 2015, at the age of 81, Mr. Crabbe was diagnosed with an astrocytoma of the brain. This was treated with chemotherapy and radiation.     CANCER HISTORY:   No history exists.     RISK FACTORS:  Colonoscopy: no; not examined. Any excessive radiation exposure in the past:  Yes, but only treatment for his brain cancer Prostate exam: No   Past Medical History  Diagnosis Date  . Shortness of breath   . DM (diabetes mellitus) ([place]) 08/09/2011    A1c at diagnosis = 12.4  . GERD (gastroesophageal reflux disease) 08/09/2011  . Seizure ([place]) 05/19/2013  . Astrocytoma of frontal lobe ([place]) 05/31/2013  . Family history of breast cancer     Past Surgical History  Procedure Laterality Date  . Appendectomy    . Brain biopsy N/A 05/24/2013    Procedure: Craniotomy for open biopsy;  Surgeon: Winfield Cunas, MD;  Location: [facility];  Service: Neurosurgery;  Laterality: N/A;  Craniotomy for open biopsy    Social History   Social History  . Marital Status: Significant Other    Spouse Name: N/A  . Number of Children: 2  . Years of Education: N/A   Occupational History  .     Social History Main Topics  . Smoking status: Current Every Day Smoker -- 1.00 packs/day for 33 years    Types: Cigarettes  .  Smokeless tobacco: Never Used     Comment: Trying to quit. Down to 1 ppd, 10/11/13 down to 1/2 ppd  . Alcohol Use: 0.0 oz/week    0 Standard drinks or equivalent per week     Comment: social  . Drug Use: Yes    Special: Marijuana     Comment: Marijuana daily  . Sexual Activity: Not Asked   Other Topics Concern  . None   Social History Narrative   [place]. He does not work now.      FAMILY HISTORY:  We obtained a detailed, 4-generation family history.  Significant diagnoses are listed below: Family History  Problem Relation Age of Onset  . Breast cancer Mother     dx in her 20s-60s  . Bone cancer Mother   . Diabetes Father   . Heart disease Father   . Diabetes Paternal Uncle   . Diabetes Brother     The patient has a son and daughter who are healthy.  He has two full brothers and one maternal half sister who are reportedly healthy but the patient is not a good historian.  His parents are both deceased.  His mother was diagnosed with breast cancer in her 62s and bone cancer possibly in her 60s. His mother had three brothers and one sister, none who  reportedly had cancer and are all now deceased.  He never knew his maternal grandparents.  The patient's father died from complications of DM and heart problems.  He had 10 brothers and sisters.  The patient is unaware of other family members with cancer.  Patient's maternal ancestors are of Caucasian descent, and paternal ancestors are of Caucasian descent. There is no reported Ashkenazi Jewish ancestry. There is no known consanguinity.  GENETIC COUNSELING ASSESSMENT: TJ KITCHINGS is a 50 y.o. male with a personal history of astrocytoma and a family history of breast cancer which is somewhat suggestive of a hereditary cancer syndrome based on his young age of onset and predisposition to cancer. We, therefore, discussed and recommended the following at today's visit.   DISCUSSION: We discussed that about 5% of  brain tumors/cancer are hereditary, and when an individual is diagnosed at a young age of onset, it increases our concern that there could be a hereditary cause of cancer. The most common genes associated with astrocytomas include CDKN2A, TSC1, NF1 and TP53.  While the family history is not consistent with these conditions, the patient is a poor historian. We reviewed the characteristics, features and inheritance patterns of hereditary cancer syndromes. We also discussed genetic testing, including the appropriate family members to test, the process of testing, insurance coverage and turn-around-time for results. We discussed the implications of a negative, positive and/or variant of uncertain significant result. We recommended Mr. Golinski pursue genetic testing for the [facility] brain tumor panel.   Based on Mr. Kama's personal and family history of cancer, he meets medical criteria for genetic testing. Despite that he meets criteria, he may still have an out of pocket cost. We discussed that if his out of pocket cost for testing is over $100, the laboratory will call and confirm whether he wants to proceed with testing.  If the out of pocket cost of testing is less than $100 he will be billed by the genetic testing laboratory.   PLAN: After considering the risks, benefits, and limitations, Mr. Bornemann  provided informed consent to pursue genetic testing and the blood sample was sent to [facility] for analysis of the brain tumor panel. Results should be available within approximately 2-3 weeks' time, at which point they will be disclosed by telephone to Mr. Payne, as will any additional recommendations warranted by these results. Mr. Schepers will receive a summary of his genetic counseling visit and a copy of his results once available. This information will also be available in Epic. We encouraged Mr. Rennie to remain in contact with cancer genetics annually so that we can continuously  update the family history and inform him of any changes in cancer genetics and testing that may be of benefit for his family. Mr. Graffam questions were answered to his satisfaction today. Our contact information was provided should additional questions or concerns arise.  Lastly, we encouraged Mr. Heacox to remain in contact with cancer genetics annually so that we can continuously update the family history and inform him of any changes in cancer genetics and testing that may be of benefit for this family.   Mr.  Harkless questions were answered to his satisfaction today. Our contact information was provided should additional questions or concerns arise. Thank you for the referral and allowing [place] to share in the care of your patient.   Karen P. Florene Glen, [place], [facility] Certified Genetic Counselor [email or web address] phone: [phone]  The patient was seen for a total of 45 minutes in face-to-face  genetic counseling.  This patient was discussed with Drs. Magrinat, Lindi Adie and/or Burr Medico who agrees with the above.    _______________________________________________________________________ For Office Staff:  Number of people involved in session: 3 Was an Intern/ student involved with case: yes

## 2015-06-30 ENCOUNTER — Encounter: Payer: Self-pay | Admitting: Genetic Counselor

## 2015-06-30 ENCOUNTER — Telehealth: Payer: Self-pay | Admitting: Genetic Counselor

## 2015-06-30 DIAGNOSIS — Z1379 Encounter for other screening for genetic and chromosomal anomalies: Secondary | ICD-10-CM | POA: Insufficient documentation

## 2015-06-30 NOTE — Telephone Encounter (Signed)
LM with good news on VM.  Told her I would be out of town for the week and will CB when I return.

## 2015-07-08 ENCOUNTER — Telehealth: Payer: Self-pay | Admitting: Genetic Counselor

## 2015-07-08 ENCOUNTER — Ambulatory Visit: Payer: Self-pay | Admitting: Genetic Counselor

## 2015-07-08 ENCOUNTER — Telehealth: Payer: Self-pay | Admitting: Radiation Oncology

## 2015-07-08 ENCOUNTER — Other Ambulatory Visit: Payer: Self-pay | Admitting: Radiation Oncology

## 2015-07-08 DIAGNOSIS — Z1379 Encounter for other screening for genetic and chromosomal anomalies: Secondary | ICD-10-CM

## 2015-07-08 DIAGNOSIS — G8928 Other chronic postprocedural pain: Secondary | ICD-10-CM

## 2015-07-08 DIAGNOSIS — C711 Malignant neoplasm of frontal lobe: Secondary | ICD-10-CM

## 2015-07-08 DIAGNOSIS — Z803 Family history of malignant neoplasm of breast: Secondary | ICD-10-CM

## 2015-07-08 DIAGNOSIS — T8132XD Disruption of internal operation (surgical) wound, not elsewhere classified, subsequent encounter: Secondary | ICD-10-CM

## 2015-07-08 MED ORDER — OXYCODONE-ACETAMINOPHEN 5-325 MG PO TABS: 1.0000 | ORAL_TABLET | Freq: Four times a day (QID) | ORAL | Status: DC | PRN

## 2015-07-08 MED FILL — OXYCODONE/APAP 5/325MG: 5-325 | 11 days supply | Qty: 90 | Fill #0

## 2015-07-08 NOTE — Progress Notes (Addendum)
HPI: [name] was previously seen in the [place] clinic due to a personal history of astrocytoma and family history of cancer and concerns regarding a hereditary predisposition to cancer. Please refer to our prior cancer genetics clinic note for more information regarding Jonathan Barker's medical, social and family histories, and our assessment and recommendations, at the time. Jonathan Barker recent genetic test results were disclosed to him, as were recommendations warranted by these results. These results and recommendations are discussed in more detail below.  FAMILY HISTORY:  We obtained a detailed, 4-generation family history.  Significant diagnoses are listed below: Family History  Problem Relation Age of Onset  . Breast cancer Mother     dx in her 19s-60s  . Bone cancer Mother   . Diabetes Father   . Heart disease Father   . Diabetes Paternal Uncle   . Diabetes Brother     The patient has a son and daughter who are healthy. He has two full brothers and one maternal half sister who are reportedly healthy but the patient is not a good historian. His parents are both deceased. His mother was diagnosed with breast cancer in her 14s and bone cancer possibly in her 44s. His mother had three brothers and one sister, none who reportedly had cancer and are all now deceased. He never knew his maternal grandparents. The patient's father died from complications of DM and heart problems. He had 10 brothers and sisters. The patient is unaware of other family members with cancer. Patient's maternal ancestors are of Caucasian descent, and paternal ancestors are of Caucasian descent. There is no reported Ashkenazi Jewish ancestry. There is no known consanguinity.  GENETIC TEST RESULTS: At the time of Jonathan Barker visit, we recommended he pursue genetic testing of the [facility] brain tumor gene panel. The CNS/Brain Cancer Panel offered by [facility] includes sequencing and/or  deletion duplication testing of the following 42 genes: ALK, APC, BAP1, BARD1, CDK4, CDKN2A, DICER1, EPCAM, EZH2, GPC3, HRAS, KIF1B, MEN1, MLH1, MSH2, MSH6, NF1, NF2, PHOX2B, PMS2, PO1, PRKAR1A, PTCH2, PTEN, RB1, SMARCA4, SMARCB1, SMARCE1, SUFU, TP53, TSC1, TSC2, and VHL.  The report date is June 30, 2015.  Genetic testing was normal, and did not reveal a deleterious mutation in these genes. The test report has been scanned into EPIC and is located under the Molecular Pathology section of the Results Review tab.   Genetic testing did identify a variant of uncertain significance (VUS) was identified in the PTCH1 gene called c.4013G>A.  At this time, it is unknown if this variant is associated with increased cancer risk or if this is a normal finding, but most variants such as this get reclassified to being inconsequential. It should not be used to make medical management decisions. With time, we suspect the lab will determine the significance of this variant, if any. If we do learn more about it, we will try to contact Jonathan Barker to discuss it further. However, it is important to stay in touch with [place] periodically and keep the address and phone number up to date. 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  The updated report date is July 11, 2020.  We discussed with Jonathan Barker that since the current genetic testing is not perfect, it is possible there may be a gene mutation in one of these genes that current testing cannot detect, but that chance is small. We also discussed, that it is possible that another gene that has not yet been discovered,  or that we have not yet tested, is responsible for the cancer diagnoses in the family, and it is, therefore, important to remain in touch with cancer genetics in the future so that we can continue to offer Jonathan Barker the most up to date genetic testing.   CANCER SCREENING RECOMMENDATIONS: This result is reassuring and indicates that Mr.  Barker likely does not have an increased risk for a future cancer due to a mutation in one of these genes. This normal test also suggests that Jonathan Barker's cancer was most likely not due to an inherited predisposition associated with one of these genes.  Most cancers happen by chance and this negative test suggests that his cancer falls into this category.  We, therefore, recommended he continue to follow the cancer management and screening guidelines provided by his oncology and primary healthcare provider.   RECOMMENDATIONS FOR FAMILY MEMBERS:  We recommended women in this family have a yearly mammogram beginning at age 37, or 54 years younger than the earliest onset of cancer, an an annual clinical breast exam, and perform monthly breast self-exams. Women in this family should also have a gynecological exam as recommended by their primary provider. All family members should have a colonoscopy by age 32.  FOLLOW-UP: Lastly, we discussed with Jonathan Barker that cancer genetics is a rapidly advancing field and it is possible that new genetic tests will be appropriate for him and/or his family members in the future. We encouraged him to remain in contact with cancer genetics on an annual basis so we can update his personal and family histories and let him know of advances in cancer genetics that may benefit this family.   Our contact number was provided. Jonathan Barker questions were answered to his satisfaction, and he knows he is welcome to call us at anytime with additional questions or concerns.   Jonathan Kayser, MS, [facility] Certified Genetic Counselor [email or web address]

## 2015-07-08 NOTE — Telephone Encounter (Signed)
Left message that patient's script is ready for pick up in the rad onc nurses station.

## 2015-07-08 NOTE — Telephone Encounter (Signed)
Revealed negative genetic testing on the [facility] hereditary CNS/brain tumor panel.  Discussed that we are not concerned about her father's astrocytoma being associated with a hereditary cause for cancer.  She voiced her understanding.

## 2015-07-15 ENCOUNTER — Other Ambulatory Visit: Payer: Self-pay | Admitting: Radiation Therapy

## 2015-07-15 DIAGNOSIS — C711 Malignant neoplasm of frontal lobe: Secondary | ICD-10-CM

## 2015-07-15 MED FILL — PHENYTOIN SOD EXT 100 MG CA: 100 | 30 days supply | Qty: 90 | Fill #7

## 2015-07-29 ENCOUNTER — Encounter: Payer: Self-pay | Admitting: Neurology

## 2015-07-29 ENCOUNTER — Ambulatory Visit (INDEPENDENT_AMBULATORY_CARE_PROVIDER_SITE_OTHER): Payer: Medicaid Other | Admitting: Neurology

## 2015-07-29 VITALS — BP 122/86 | HR 76 | Resp 16 | Wt 187.0 lb

## 2015-07-29 DIAGNOSIS — G40109 Localization-related (focal) (partial) symptomatic epilepsy and epileptic syndromes with simple partial seizures, not intractable, without status epilepticus: Secondary | ICD-10-CM | POA: Diagnosis not present

## 2015-07-29 DIAGNOSIS — C719 Malignant neoplasm of brain, unspecified: Secondary | ICD-10-CM

## 2015-07-29 NOTE — Progress Notes (Signed)
NEUROLOGY FOLLOW UP OFFICE NOTE  MAXIMILIANO MURAMOTO CR:[id number]  HISTORY OF PRESENT ILLNESS: I had the pleasure of seeing Jonathan Barker in follow-up in the neurology clinic on 07/29/2015.  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 He denies any seizures since January 2015. His Dilantin level was low, but he reported taking only 200mg /day. He is now taking 300mg  every morning without any seizures or seizure-like symptoms, no side effects. He denies any further eye blinking episodes, generalized shaking/stiffening,olfactory/gustatory hallucinations, deja vu, rising epigastric sensation, focal numbness/tingling/weakness, myoclonic jerks. He has been having frequent headaches since the brain biopsy, mostly at night, he is happy with taking pain medication before he goes to bed. He denies any dizziness or diplopia. 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 This has been primarily nonenhancing, however there is an increasing small 4-21mm focus of nodular enhancement in the right frontal lobe with mildly restricted diffision suspicious for a small area of hypercellular at least grade 3 tumor. Stable post-treatment appearance. He has a follow-up MRI brain scheduled next month. He is driving.   HPI 04/15/15: This is a pleasant [name] who had a new onset seizure last 05/18/13 and was found to have an infiltrative mass in the anteromedial right frontal lobe without enhancement, with FLAIR signal extending across the corpus callosum into the contralateral subcortical left frontal lobe. His daughter reports that he started blinking excessively, he recalls feeling his eyes "blinking at 100 mph," he tried to sit but could not because his left hand was locked inside his pocket. He was witnessed to have generalized shaking for less than a minute. He reports that he recalls the entire  event, he could hear people screaming around him. His daughter reports that he was coherent right after the seizure, no post-ictal confusion or focal weakness. He was brought to [facility] where imaging revealed the bifrontal mass, biopsy demonstrated diffuse grade 2 astrocytoma. Wake and drowsy EEG was normal. He took Temodar, then underwent radiotherapy until May 2015. He denies any further seizures since January, but does feel that he may have had a seizure the week after hospital discharge in January 2015, he woke up with his arms flexed, unsure if he had dreamed he had a seizure, no associated tongue bite or incontinence. His wife had mentioned around that time that she felt the bed shaking one night. Since January 2015, he denies any further eye blinking episodes, generalized shaking or stiffening of extremities, olfactory/gustatory hallucinations, deja vu, rising epigastric sensation, focal numbness/tingling/weakness, myoclonic jerks. He has had low Dilantin in levels in the past, and reports that he had only been taking it twice a day instead of TID, but with most recent level checked in November 2016 (<2.5), he reports taking Dilantin 100mg  TID. He denies any side effects to the medication.  He denies any prior history of headaches until brain biopsy, and since then he has been having frequent headaches. Headaches are over the frontal region, with pressure sensation, no associated nausea, vomiting, photo/phonophobia. He reports headaches occur at the end of the day, 3-4 times a week, with good response to Percocet. He usually takes the medication and goes to bed, with no headache on awakening. He notices more headaches with cold weather. He denies any dizziness, vision changes, dysarthria/dysphagia, neck/back pain, focal numbness/tingling/weakness, bowel/bladder dysfunction. He usually has 4 alcoholic drinks a month. He had a normal  birth and early development. There is no history of febrile convulsions, CNS  infections such as meningitis/encephalitis, significant traumatic brain injury, or family history of seizures.  I personally reviewed most recent MRI brain with and without contrast done 02/14/15 which showed abnormal FLAIR signal throughout the white matter of the frontal lobes, right more than left, unchanged, with small cystic area in the left frontal cortical/subcortical region that is noted to be slightly larger (3mm) compared to last year. There is a punctate focus of enhancement in the right frontal white matter, 72mm focus of enhancement in the right basal ganglia/external capsule that is new.   PAST MEDICAL HISTORY: Past Medical History  Diagnosis Date  . Shortness of breath   . DM (diabetes mellitus) ([place]) 08/09/2011    A1c at diagnosis = 12.4  . GERD (gastroesophageal reflux disease) 08/09/2011  . Seizure ([place]) 05/19/2013  . Astrocytoma of frontal lobe ([place]) 05/31/2013  . Family history of breast cancer     MEDICATIONS: Current Outpatient Prescriptions on File Prior to Visit  Medication Sig Dispense Refill  . metFORMIN (GLUCOPHAGE) 1000 MG tablet Take 1 tablet (1,000 mg total) by mouth daily. 90 tablet 12  . oxyCODONE-acetaminophen (PERCOCET/ROXICET) 5-325 MG tablet Take 1-2 tablets by mouth every 6 (six) hours as needed for severe pain. 90 tablet 0  . phenytoin (DILANTIN) 100 MG ER capsule Take 3 capsules every morning 90 capsule 11  . prochlorperazine (COMPAZINE) 10 MG tablet Take 1/2 hour prior to taking temodar daily (Patient not taking: Reported on 04/08/2014) 30 tablet 4   No current facility-administered medications on file prior to visit.    ALLERGIES: No Known Allergies  FAMILY HISTORY: Family History  Problem Relation Age of Onset  . Breast cancer Mother     dx in her 58s-60s  . Bone cancer Mother   . Diabetes Father   . Heart disease Father   . Diabetes Paternal Uncle   . Diabetes Brother     SOCIAL HISTORY: Social History   Social History  . Marital  Status: Significant Other    Spouse Name: N/A  . Number of Children: 2  . Years of Education: N/A   Occupational History  .     Social History Main Topics  . Smoking status: Current Every Day Smoker -- 1.00 packs/day for 33 years    Types: Cigarettes  . Smokeless tobacco: Never Used     Comment: Trying to quit. Down to 1 ppd, 10/11/13 down to 1/2 ppd  . Alcohol Use: 0.0 oz/week    0 Standard drinks or equivalent per week     Comment: social  . Drug Use: Yes    Special: Marijuana     Comment: Marijuana daily  . Sexual Activity: Not on file   Other Topics Concern  . Not on file   Social History Narrative   [place] the [place]. He does not work now.     REVIEW OF SYSTEMS: Constitutional: No fevers, chills, or sweats, no generalized fatigue, change in appetite Eyes: No visual changes, double vision, eye pain Ear, nose and throat: No hearing loss, ear pain, nasal congestion, sore throat Cardiovascular: No chest pain, palpitations Respiratory:  No shortness of breath at rest or with exertion, wheezes GastrointestinaI: No nausea, vomiting, diarrhea, abdominal pain, fecal incontinence Genitourinary:  No dysuria, urinary retention or frequency Musculoskeletal:  No neck pain, back pain Integumentary: No rash, pruritus, skin lesions Neurological: as above Psychiatric: No depression, insomnia, anxiety Endocrine: No palpitations,  fatigue, diaphoresis, mood swings, change in appetite, change in weight, increased thirst Hematologic/Lymphatic:  No anemia, purpura, petechiae. Allergic/Immunologic: no itchy/runny eyes, nasal congestion, recent allergic reactions, rashes  PHYSICAL EXAM: Filed Vitals:   07/29/15 0825  BP: 122/86  Pulse: 76  Resp: 16   General: No acute distress Head:  Normocephalic/atraumatic Neck: supple, no paraspinal tenderness, full range of motion Heart:  Regular rate and rhythm Lungs:  Clear to auscultation bilaterally Back: No paraspinal  tenderness Skin/Extremities: No rash, no edema Neurological Exam: alert and oriented to person, place, and time. No aphasia or dysarthria. Fund of knowledge is appropriate.  Recent and remote memory are intact.  2/3 delayed recall. Attention and concentration are normal.    Able to name objects and repeat phrases. Cranial nerves: Pupils equal, round, reactive to light. Extraocular movements intact with no nystagmus. Visual fields full. Facial sensation intact. No facial asymmetry. Tongue, uvula, palate midline.  Motor: Bulk and tone normal, muscle strength 5/5 throughout with no pronator drift.  Sensation to light touch intact.  No extinction to double simultaneous stimulation.  Deep tendon reflexes 2+ throughout, toes downgoing.  Finger to nose testing intact.  Gait narrow-based and steady, able to tandem walk adequately.  Romberg negative.  IMPRESSION: This is a pleasant [name] with a history of new onset seizure last January 2015 during which he was found to have a bifrontal (right greater than left) grade II astrocytoma. He underwent Temodar treatment and radiotherapy, follow-up imaging in January noted increasing small 4-44mm focus of nodular enhancement in the right frontal lobe. He has a follow-up scan next month. He is now taking Dilantin once a day 300mg  qAM. In the past he has had subtherapeutic levels, he has not had repeat Dilantin level checked, this will be ordered today. He has not had any seizures since January 2015. Would continue on current Dilantin dose. We had previously discussed his options, including option of switching to a different seizure medication, which he is very hesitant to do. He reports compliance to his medications now. [place] driving laws were again discussed with the patient, and he knows to stop driving after a seizure, until 6 months seizure-free. He will follow-up in 6 months and knows to call for any changes.  Thank you for allowing me to participate in his care.   Please do not hesitate to call for any questions or concerns.  The duration of this appointment visit was 15 minutes of face-to-face time with the patient.  Greater than 50% of this time was spent in counseling, explanation of diagnosis, planning of further management, and coordination of care.   Ellouise Newer, M.D.   CC: Dr. Melburn Hake, Dr. Tammi Klippel

## 2015-07-29 NOTE — Patient Instructions (Signed)
1. Have your bloodwork for Dilantin level done first thing in the morning 2. Continue Dilantin 100mg : Take 3 capsules every morning 3. Follow-up in 6 months, call for any changes  Seizure Precautions: 1. If medication has been prescribed for you to prevent seizures, take it exactly as directed.  Do not stop taking the medicine without talking to your doctor first, even if you have not had a seizure in a long time.   2. Avoid activities in which a seizure would cause danger to yourself or to others.  Don't operate dangerous machinery, swim alone, or climb in high or dangerous places, such as on ladders, roofs, or girders.  Do not drive unless your doctor says you may.  3. If you have any warning that you may have a seizure, lay down in a safe place where you can't hurt yourself.    4.  No driving for 6 months from last seizure, as per [facility]   Please refer to the following link on the [facility] website for more information: http://www.epilepsyfoundation.org/answerplace/Social/driving/drivingu.cfm   5.  Maintain good sleep hygiene. Avoid alcohol.  6.  Contact your doctor if you have any problems that may be related to the medicine you are taking.  7.  Call 911 and bring the patient back to the ED if:        A.  The seizure lasts longer than 5 minutes.       B.  The patient doesn't awaken shortly after the seizure  C.  The patient has new problems such as difficulty seeing, speaking or moving  D.  The patient was injured during the seizure  E.  The patient has a temperature over 102 F (39C)  F.  The patient vomited and now is having trouble breathing

## 2015-08-05 ENCOUNTER — Other Ambulatory Visit: Payer: Self-pay | Admitting: Radiation Oncology

## 2015-08-05 DIAGNOSIS — T8132XD Disruption of internal operation (surgical) wound, not elsewhere classified, subsequent encounter: Secondary | ICD-10-CM

## 2015-08-05 DIAGNOSIS — G8928 Other chronic postprocedural pain: Secondary | ICD-10-CM

## 2015-08-05 DIAGNOSIS — C711 Malignant neoplasm of frontal lobe: Secondary | ICD-10-CM

## 2015-08-05 MED ORDER — OXYCODONE-ACETAMINOPHEN 5-325 MG PO TABS: 1.0000 | ORAL_TABLET | Freq: Four times a day (QID) | ORAL | Status: DC | PRN

## 2015-08-06 MED FILL — PHENYTOIN SOD EXT 100 MG CA: 100 | 30 days supply | Qty: 90 | Fill #8

## 2015-08-07 MED FILL — OXYCODONE/APAP 5/325MG: 5-325 | 11 days supply | Qty: 90 | Fill #0

## 2015-08-13 ENCOUNTER — Other Ambulatory Visit: Payer: Medicaid Other

## 2015-08-13 DIAGNOSIS — G40109 Localization-related (focal) (partial) symptomatic epilepsy and epileptic syndromes with simple partial seizures, not intractable, without status epilepticus: Secondary | ICD-10-CM

## 2015-08-14 LAB — PHENYTOIN LEVEL, TOTAL

## 2015-08-22 ENCOUNTER — Telehealth: Payer: Self-pay | Admitting: Internal Medicine

## 2015-08-22 NOTE — Telephone Encounter (Signed)
APPT. REMINDER CALL, LMTCB °

## 2015-08-25 ENCOUNTER — Encounter: Payer: Self-pay | Admitting: Internal Medicine

## 2015-08-25 ENCOUNTER — Ambulatory Visit (INDEPENDENT_AMBULATORY_CARE_PROVIDER_SITE_OTHER): Payer: Medicaid Other | Admitting: Internal Medicine

## 2015-08-25 VITALS — BP 128/76 | HR 94 | Temp 97.7°F | Ht 71.0 in | Wt 188.3 lb

## 2015-08-25 DIAGNOSIS — C711 Malignant neoplasm of frontal lobe: Secondary | ICD-10-CM | POA: Diagnosis not present

## 2015-08-25 DIAGNOSIS — Z7984 Long term (current) use of oral hypoglycemic drugs: Secondary | ICD-10-CM | POA: Diagnosis not present

## 2015-08-25 DIAGNOSIS — Z720 Tobacco use: Secondary | ICD-10-CM

## 2015-08-25 DIAGNOSIS — F17210 Nicotine dependence, cigarettes, uncomplicated: Secondary | ICD-10-CM

## 2015-08-25 DIAGNOSIS — E119 Type 2 diabetes mellitus without complications: Secondary | ICD-10-CM

## 2015-08-25 LAB — POCT GLYCOSYLATED HEMOGLOBIN (HGB A1C): Hemoglobin A1C: 6.8

## 2015-08-25 LAB — GLUCOSE, CAPILLARY: Glucose-Capillary: 128 mg/dL — ABNORMAL HIGH (ref 65–99)

## 2015-08-25 MED ORDER — METFORMIN HCL 1000 MG PO TABS: ORAL_TABLET | ORAL | Status: DC

## 2015-08-25 MED ORDER — NICOTINE POLACRILEX 4 MG MT GUM: 4.0000 mg | CHEWING_GUM | OROMUCOSAL | Status: DC | PRN

## 2015-08-25 MED ORDER — METFORMIN HCL 1000 MG PO TABS
1000.0000 mg | ORAL_TABLET | Freq: Two times a day (BID) | ORAL | Status: DC
Start: 2015-08-25 — End: 2015-08-25

## 2015-08-25 MED FILL — NICOTINE 4 MG CHEWING GUM: 4 | 14 days supply | Qty: 110 | Fill #0

## 2015-08-25 NOTE — Assessment & Plan Note (Signed)
He continues to smoke 1 pack daily and is interested in quitting. I've prescribed him nicorette gum today. He is not interested in the patch.

## 2015-08-25 NOTE — Progress Notes (Signed)
Patient ID: Jonathan Barker, male   DOB: 03/01/1966, 50 y.o.   MRN: [id number] [facility] Subjective:   Patient ID: Jonathan Barker male   DOB: 1965-08-07 50 y.o.   MRN: [id number]  HPI: [name] is a 50 y.o. male with a bifrontal grady II astrocytoma, non-insulin dependent type 2 diabetes, and tobacco abuse here for follow-up of diabetes and tobacco abuse:  Type 2 diabetes: His last a1c in January 2017 was 7.1; he's been compliant with metformin 1g daily. He's still drinking 2-3 mountain dews daily.  Tobacco abuse: He continues to smoke 1 pack of cigarettes daily. He never got the Nicorette prescription.  I have reviewed his medications with him today and he continues to smoke per above.  Review of Systems  Constitutional: Negative for fever, chills and weight loss.  Respiratory: Negative for shortness of breath.   Cardiovascular: Negative for chest pain.  Skin: Negative for rash.  Neurological: Positive for headaches. Negative for dizziness, sensory change, speech change and seizures.   Objective:  Physical Exam: Filed Vitals:   08/25/15 1340  BP: 149/81  Pulse: 94  Temp: 97.7 F (36.5 C)  TempSrc: Oral  Height: 5\' 11"  (1.803 m)  Weight: 188 lb 4.8 oz (85.412 kg)  SpO2: 99%   General: very friendly man with a beard resting in chair comfortably, appropriately conversational HEENT: no scleral icterus, extra-ocular muscles intact, oropharynx without lesions Cardiac: regular rate and rhythm, no rubs, murmurs or gallops Pulm: breathing well, clear to auscultation bilaterally Abd: bowel sounds normal, soft, nondistended, non-tender Ext: warm and well perfused, without pedal edema Lymph: no cervical or supraclavicular lymphadenopathy Skin: no rash, hair, or nail changes Neuro: alert and oriented X3, cranial nerves II-XII grossly intact, moving all extremities well  Assessment & Plan:  Case discussed with Dr. Daryll Drown  Diabetes type 2,  controlled ([place]) His a1c today is at-goal (6.8) on metformin 1g daily. He'd like to get better control so he'll start taking metformin 1g in the morning and 500mg  in the evening.  Tobacco abuse He continues to smoke 1 pack daily and is interested in quitting. I've prescribed him nicorette gum today. He is not interested in the patch.  Astrocytoma of frontal lobe There was a new 49mm focus seen on his brain MRI back in January, concerning for recurrence. He's quite nervous about this. He's getting a repeat MRI this Thursday. He has not had a seizure since 2015 but has headaches worsened by activity that are relieved by Percocet.   Medications Ordered Meds ordered this encounter  Medications  . nicotine polacrilex (NICORETTE) 4 MG gum    Sig: Take 1 each (4 mg total) by mouth as needed for smoking cessation.    Dispense:  100 tablet    Refill:  0  . metFORMIN (GLUCOPHAGE) 1000 MG tablet    Sig: Take 1 tablet (1,000 mg total) by mouth 2 (two) times daily with a meal.    Dispense:  90 tablet    Refill:  12   Other Orders Orders Placed This Encounter  Procedures  . POCT HgB A1C (CPT [phone])   Follow Up: Return in about 3 months (around 11/24/2015).

## 2015-08-25 NOTE — Assessment & Plan Note (Signed)
His a1c today is at-goal (6.8) on metformin 1g daily. He'd like to get better control so he'll start taking metformin 1g in the morning and 500mg  in the evening.

## 2015-08-25 NOTE — Patient Instructions (Signed)
Mr. Spinney,  It was great to see you today.  I'll be praying the brain tumor has not progressed.  I've prescribed you nicorette gum to help you quit smoking and we've increased your metformin to 1 pill in the morning and 1/2 pill in the evening.  It was great getting to know you and I wish you all the best.  Take care, and we'll see you back in 3 months, Dr. Melburn Hake

## 2015-08-25 NOTE — Assessment & Plan Note (Signed)
There was a new 17mm focus seen on his brain MRI back in January, concerning for recurrence. He's quite nervous about this. He's getting a repeat MRI this Thursday. He has not had a seizure since 2015 but has headaches worsened by activity that are relieved by Percocet.

## 2015-08-27 ENCOUNTER — Telehealth: Payer: Self-pay | Admitting: Radiation Oncology

## 2015-08-27 NOTE — Telephone Encounter (Signed)
Phoned patient making him aware I received his message and forwarded his request for percocet refill onto Shona Simpson, PA-C. Patient verbalized understanding. Confirmed follow up appointment for May 1 at 1045.

## 2015-08-28 ENCOUNTER — Ambulatory Visit (HOSPITAL_COMMUNITY)
Admission: RE | Admit: 2015-08-28 | Discharge: 2015-08-28 | Disposition: A | Discharge status: Home / Self Care | Payer: Medicaid Other | Source: Ambulatory Visit | Attending: Radiation Oncology | Admitting: Radiation Oncology

## 2015-08-28 DIAGNOSIS — C711 Malignant neoplasm of frontal lobe: Secondary | ICD-10-CM | POA: Diagnosis present

## 2015-08-28 DIAGNOSIS — R930 Abnormal findings on diagnostic imaging of skull and head, not elsewhere classified: Secondary | ICD-10-CM | POA: Diagnosis not present

## 2015-08-28 LAB — CREATININE, SERUM
Creatinine, Ser: 0.77 mg/dL (ref 0.61–1.24)
GFR calc non Af Amer: >60 mL/min (ref >60)

## 2015-08-28 MED ORDER — GADOBENATE DIMEGLUMINE 529 MG/ML IV SOLN
20.0000 mL | Freq: Once | INTRAVENOUS | Status: AC | PRN
  Administered 2015-08-28: 15 mL via INTRAVENOUS

## 2015-08-28 NOTE — Progress Notes (Signed)
Internal Medicine Clinic Attending  Case discussed with Dr. Flores at the time of the visit.  We reviewed the resident's history and exam and pertinent patient test results.  I agree with the assessment, diagnosis, and plan of care documented in the resident's note. 

## 2015-09-01 ENCOUNTER — Encounter: Payer: Self-pay | Admitting: Radiation Oncology

## 2015-09-01 ENCOUNTER — Ambulatory Visit
Admission: RE | Admit: 2015-09-01 | Discharge: 2015-09-01 | Disposition: A | Discharge status: Home / Self Care | Payer: Medicaid Other | Source: Ambulatory Visit | Attending: Radiation Oncology | Admitting: Radiation Oncology

## 2015-09-01 VITALS — BP 122/85 | HR 90 | Temp 97.6°F | Resp 16 | Ht 71.0 in | Wt 184.0 lb

## 2015-09-01 DIAGNOSIS — C711 Malignant neoplasm of frontal lobe: Secondary | ICD-10-CM

## 2015-09-01 DIAGNOSIS — G8928 Other chronic postprocedural pain: Secondary | ICD-10-CM

## 2015-09-01 DIAGNOSIS — T8132XD Disruption of internal operation (surgical) wound, not elsewhere classified, subsequent encounter: Secondary | ICD-10-CM

## 2015-09-01 MED ORDER — OXYCODONE-ACETAMINOPHEN 5-325 MG PO TABS: 1.0000 | ORAL_TABLET | Freq: Four times a day (QID) | ORAL | Status: DC | PRN

## 2015-09-01 MED FILL — OXYCODONE/APAP 5/325MG: 5-325 | 11 days supply | Qty: 90 | Fill #0

## 2015-09-01 NOTE — Progress Notes (Signed)
Maudie Flakes here for follow up.  He reports having headaches in the mornings and at night.  He is currently taking 2-4 percocet per day.  He denies having vision changes or balance issues.  He reports having fatigue "all the time."  He is not taking decadron.  BP 122/85 mmHg  Pulse 90  Temp(Src) 97.6 F (36.4 C) (Oral)  Resp 16  Ht 5\' 11"  (1.803 m)  Wt 184 lb (83.462 kg)  BMI 25.67 kg/m2  SpO2 99%   Wt Readings from Last 3 Encounters:  09/01/15 184 lb (83.462 kg)  08/28/15 189 lb (85.73 kg)  08/25/15 188 lb 4.8 oz (85.412 kg)

## 2015-09-01 NOTE — Progress Notes (Signed)
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    ICD-9-CM ICD-10-CM   1. Astrocytoma of frontal lobe (HCC) 191.1 C71.1 oxyCODONE-acetaminophen (PERCOCET/ROXICET) 5-325 MG tablet     Ambulatory referral to Oncology  2. Dehiscence of closure of skull or craniotomy, subsequent encounter V58.89 T81.32XD oxyCODONE-acetaminophen (PERCOCET/ROXICET) 5-325 MG tablet   998.31    3. Chronic postoperative pain 338.28 G89.28 oxyCODONE-acetaminophen (PERCOCET/ROXICET) 5-325 MG tablet    Interval Since Last Radiation:  12  months   06/18/2013-09/03/2013 to 59.4 Gy   Narrative:   In summary, this gentleman was diagnosed with a bifrontal right greater than left 8.7 cm grade 2 astrocytoma on an open biopsy on 05/24/2013. He did undergo radiotherapy treatment as outlined above and completed this in May 2015 as well as Temodar. He believes that he completed Temodar about 6 months after completion of his radiotherapy. He has been followed closely since completing radiotherapy with repeat MRI scans. His most recent MRI scan on 09/27/2015 reveals a 6 mm ring-enhancing focus in the right frontal white matter increased from 4 mm in January 2017. Two apparent punctate foci of enhancement laterally at the same level in the right frontal lobe are felt to be either artifact versus vascularity versus enhancement concerning for disease, and a new 3 mm focus of enhancement in the body of the corpus callosum was identified. Chronic postoperative changes are again seen in the high right frontal lobe. He comes today to review these findings.                         On review of systems,the patient states that he continues to experience trouble with headaches, and  states that he takes between 2-4 Percocet daily to the these headaches. He states that his symptoms are right greater than left in the frontal region of his head, nonradiating, and only alleviated with pain medication. He finds that his symptoms are worse when it is cold outside,and more noticeable in the evening. He denies any blurred vision, double vision, or auditory disturbance. He denies any imbalance gait, her personality change. He denies any shortness of breath or chest pain, nausea, vomiting, abdominal pain, difficulty with bowel or bladder dysfunction. A complete review of systems is obtained and is otherwise negative.   Past Medical History:  Past Medical History  Diagnosis Date  . Shortness of breath   . DM (diabetes mellitus) ([place]) 08/09/2011    A1c at diagnosis = 12.4  . GERD (gastroesophageal reflux disease) 08/09/2011  . Seizure ([place]) 05/19/2013  . Astrocytoma of frontal lobe ([place]) 05/31/2013  . Family history of breast cancer     Past Surgical History: Past Surgical History  Procedure Laterality Date  . Appendectomy    . Brain biopsy N/A 05/24/2013    Procedure: Craniotomy for open biopsy;  Surgeon: Winfield Cunas, MD;  Location: [facility];  Service: Neurosurgery;  Laterality: N/A;  Craniotomy for open biopsy    Social History:  Social History   Social History  . Marital Status: Significant Other    Spouse Name: N/A  . Number of Children: 2  . Years of Education: N/A   Occupational History  .  Social History Main Topics  . Smoking status: Current Every Day Smoker -- 1.00 packs/day for 33 years    Types: Cigarettes  . Smokeless tobacco: Never Used     Comment: Trying to quit. Down to 1 ppd, 10/11/13 down to 1/2 ppd  . Alcohol Use: 0.0 oz/week    0 Standard drinks or equivalent per week     Comment: social  . Drug Use: Yes    Special: Marijuana     Comment: Marijuana daily  . Sexual Activity: Not on file   Other Topics Concern  . Not on file   Social  History Narrative   [place]. He does not work now.   The patient has been trying to be an active participant in obtaining health insurance, he is currently on medication, but has been having significant financial difficulties since his original diagnosis as a result of seeking care in a different location initially.  Family History: Family History  Problem Relation Age of Onset  . Breast cancer Mother     dx in her 65s-60s  . Bone cancer Mother   . Diabetes Father   . Heart disease Father   . Diabetes Paternal Uncle   . Diabetes Brother     ALLERGIES:  has No Known Allergies.  Meds: Current Outpatient Prescriptions  Medication Sig Dispense Refill  . metFORMIN (GLUCOPHAGE) 1000 MG tablet Take 1 pill in the morning and 1/2 pill in the evening. 90 tablet 12  . oxyCODONE-acetaminophen (PERCOCET/ROXICET) 5-325 MG tablet Take 1-2 tablets by mouth every 6 (six) hours as needed for severe pain. 90 tablet 0  . phenytoin (DILANTIN) 100 MG ER capsule Take 3 capsules every morning 90 capsule 11  . nicotine polacrilex (NICORETTE) 4 MG gum Take 1 each (4 mg total) by mouth as needed for smoking cessation. (Patient not taking: Reported on 09/01/2015) 100 tablet 0   No current facility-administered medications for this encounter.    Physical Findings:  height is 5\' 11"  (1.803 m) and weight is 184 lb (83.462 kg). His oral temperature is 97.6 F (36.4 C). His blood pressure is 122/85 and his pulse is 90. His respiration is 16 and oxygen saturation is 99%.     Lab Findings: Lab Results  Component Value Date   WBC 9.2 11/16/2013   WBC 10.5 05/20/2013   HGB 16.1 11/16/2013   HGB 16.0 05/20/2013   HCT 46.8 11/16/2013   HCT 46.0 05/20/2013   PLT 279 11/16/2013   PLT 268 05/20/2013    Lab Results  Component Value Date   NA 139 01/16/2014   NA 141 05/21/2013   K 4.1 01/16/2014   K 4.0 05/21/2013   CHLORIDE 105 01/16/2014   CO2 24 01/16/2014   CO2 24 05/21/2013    GLUCOSE 188* 01/16/2014   GLUCOSE 206* 05/21/2013   BUN 11.6 01/16/2014   BUN 14 05/21/2013   CREATININE 0.77 08/28/2015   CREATININE 1.1 01/16/2014   BILITOT 0.27 11/16/2013   BILITOT 0.2* 05/18/2013   ALKPHOS 83 11/16/2013   ALKPHOS 89 05/18/2013   AST 23 11/16/2013   AST 24 05/18/2013   ALT 35 11/16/2013   ALT 28 05/18/2013   PROT 7.1 11/16/2013   PROT 7.5 05/18/2013   ALBUMIN 3.9 11/16/2013   ALBUMIN 3.9 05/18/2013   CALCIUM 9.5 01/16/2014   CALCIUM 8.9 05/21/2013   ANIONGAP 10 01/16/2014    Radiographic Findings: [name] Wo Contrast  08/28/2015  CLINICAL DATA:  Bi frontal grade 2 astrocytoma. Follow-up. Radiation therapy. Status post chemotherapy and radiation therapy. EXAM: MRI HEAD WITHOUT AND WITH CONTRAST TECHNIQUE: Multiplanar, multiecho pulse sequences of the brain and surrounding structures were obtained without and with intravenous contrast. CONTRAST:  62mL MULTIHANCE GADOBENATE DIMEGLUMINE 529 MG/ML IV SOLN COMPARISON:  05/27/2015 in 01/2013 16 FINDINGS: There is no evidence of acute infarct, acute intracranial hemorrhage, midline shift, or extra-axial fluid collection. Chronic postoperative changes are again seen in the high right frontal lobe. Confluent white matter T2 hyperintensity throughout the anterior right frontal lobe is unchanged from the 2 prior studies. Extension across the anterior corpus callosum into the left frontal white matter is also unchanged, as are separate small foci of T2 hyperintensity in the left corona radiata. A 6 mm ring-enhancing focus in the right frontal white matter has slightly increased in size (series 11, image 98, previously 4 mm). Two apparent punctate foci of enhancement just laterally at the same level in the right frontal lobe on the axial postcontrast sequence are not confirmed in other planes and are felt to be either vascular or artifact. There is however suggestion of a new 3 mm focus of enhancement in the body of the corpus  callosum on axial and sagittal sequences (series 11, image 101 and series 13, image 12). Punctate focus of enhancement in the right pons is unchanged. Orbits are unremarkable. A small left maxillary sinus mucous retention cyst is noted. The mastoid air cells are clear. Major intracranial vascular flow voids are preserved. IMPRESSION: 1. Further slight increase in size of 6 mm enhancing focus in the right frontal lobe. 2. New 3 mm enhancing focus in the body of the corpus callosum. 3. Unchanged extent of right greater than left frontal lobe T2 hyperintensity. Electronically Signed   By: Logan Bores M.D.   On: 08/28/2015 16:17    Impression/Plan: 1. 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 The patient's case is presented this morning in our multidisciplinary brain conference, given the findings of increased T2 signal layering, and a new 3 mm lesion, the concern was whether or not he would benefit from additional systemic therapy. Neurosurgery weight in this morning as well and did not feel that it would be beneficial to go after biopsy is 3 mm lesion as the risk would be high with a low yield. The patient is counseled on these recommendations, and is interested in moving forward. Interestingly he does not have a medical oncologist at this time of the previous medical oncologist the prescribed his Temodar has since left the practice. He has not been followed by medical oncology since. We will refer him to Dr. Tana Coast in [place] for her perspective on systemic therapy versus watch and wait, however the suspicion checked conference this morning with high for early findings of recurrence rather than radiation necrosis. If systemic therapy is recommended, we will refer the patient's medical oncology if he receives treatment here in [place]. The patient states agreement and understanding. He has a [place] trip planned with his daughter later this month, and  hopefully he can be seen before he leaves on this trip.   2. Financial issues. I will recheck her social worker to see if there is any thing that needs to be communicated regarding his financial situation. He states agreement and understanding.     Carola Rhine, PAC

## 2015-09-08 ENCOUNTER — Telehealth: Payer: Self-pay | Admitting: *Deleted

## 2015-09-08 NOTE — Telephone Encounter (Signed)
CALLED PATIENT TO INFORM OF APPT. WITH DR. [name] ON 09-16-15- ARRIVAL TIME - 10:45 AM, SPOKE WITH PATIENT'S DAUGHTER - SAVANNAH AND SHE IS AWARE OF THIS APPT.

## 2015-09-10 ENCOUNTER — Telehealth: Payer: Self-pay | Admitting: Radiation Oncology

## 2015-09-10 NOTE — Telephone Encounter (Signed)
At the patient's request fax last office note and MRI report to [place] at [facility]. Confirmation fax of delivery obtained.

## 2015-09-15 MED FILL — PHENYTOIN SOD EXT 100 MG CA: 100 | 30 days supply | Qty: 90 | Fill #9

## 2015-09-26 ENCOUNTER — Telehealth: Payer: Self-pay | Admitting: Radiation Oncology

## 2015-09-26 ENCOUNTER — Other Ambulatory Visit: Payer: Self-pay | Admitting: Radiation Oncology

## 2015-09-26 DIAGNOSIS — C711 Malignant neoplasm of frontal lobe: Secondary | ICD-10-CM

## 2015-09-26 DIAGNOSIS — T8132XD Disruption of internal operation (surgical) wound, not elsewhere classified, subsequent encounter: Secondary | ICD-10-CM

## 2015-09-26 DIAGNOSIS — G8928 Other chronic postprocedural pain: Secondary | ICD-10-CM

## 2015-09-26 DIAGNOSIS — E119 Type 2 diabetes mellitus without complications: Secondary | ICD-10-CM

## 2015-09-26 MED ORDER — OXYCODONE-ACETAMINOPHEN 5-325 MG PO TABS: 1.0000 | ORAL_TABLET | Freq: Four times a day (QID) | ORAL | Status: DC | PRN

## 2015-09-26 MED FILL — OXYCODONE/APAP 5/325 MG TAB: 5-325 | 12 days supply | Qty: 90 | Fill #0

## 2015-09-26 MED FILL — metFORMIN HCL 1000 MG TABS: 1000 | 60 days supply | Qty: 90 | Fill #0

## 2015-09-26 NOTE — Telephone Encounter (Signed)
Phoned patient. Left message informing him his percocet script is ready for pick up in the rad onc nursing station.

## 2015-09-26 NOTE — Telephone Encounter (Signed)
[place], patient's daughter, returned my call. She reports her father was seen by Dr. [name] on 09/15/15. She reports that she understood from her father the intent is to wait a couple months and repeat MRI. Also, she reports their understanding the lesion is "too small to biopsy."

## 2015-09-26 NOTE — Telephone Encounter (Signed)
Phoned patient no answer. Phoned patient's daughter, Overton Mam, no answer. Left message for [facility] explaining I received her father's voicemail requesting Percocet refill. Also, explained I want to inquire about visit with [place]. Waiting return call.

## 2015-10-17 ENCOUNTER — Other Ambulatory Visit: Payer: Self-pay | Admitting: Radiation Oncology

## 2015-10-17 ENCOUNTER — Telehealth: Payer: Self-pay | Admitting: Radiation Oncology

## 2015-10-17 NOTE — Telephone Encounter (Signed)
Received voicemail message from patient requesting refill of Dilantin. Phoned patient back at the number provided [phone]. No answer. Left message explaining Dr. Delice Lesch has given him 11 refills. Explained if he is in fact out of refills he would need to contact Dr. Amparo [place] office to obtain refills. Encouraged they phone back with additional questions and contact number was provided.

## 2015-10-20 MED FILL — PHENYTOIN SOD EXT 100 MG CA: 100 | 30 days supply | Qty: 90 | Fill #0

## 2015-10-21 ENCOUNTER — Encounter: Payer: Self-pay | Admitting: *Deleted

## 2015-10-23 ENCOUNTER — Other Ambulatory Visit: Payer: Self-pay | Admitting: Radiation Oncology

## 2015-10-23 DIAGNOSIS — T8132XD Disruption of internal operation (surgical) wound, not elsewhere classified, subsequent encounter: Secondary | ICD-10-CM

## 2015-10-23 DIAGNOSIS — G8928 Other chronic postprocedural pain: Secondary | ICD-10-CM

## 2015-10-23 DIAGNOSIS — C711 Malignant neoplasm of frontal lobe: Secondary | ICD-10-CM

## 2015-10-23 MED ORDER — OXYCODONE-ACETAMINOPHEN 5-325 MG PO TABS: 1.0000 | ORAL_TABLET | Freq: Four times a day (QID) | ORAL | Status: DC | PRN

## 2015-10-23 MED FILL — OXYCODONE/APAP 5/325MG: 5-325 | 11 days supply | Qty: 90 | Fill #0

## 2015-10-28 ENCOUNTER — Telehealth: Payer: Self-pay | Admitting: Radiation Oncology

## 2015-10-28 NOTE — Telephone Encounter (Signed)
Patient does not have a cell phone presently. Phoned patient's daughter, Overton Mam. Requested she have her father call me. She questions why. Explained we would like to offer a palliative care referral. Went onto explain this referral would be for pain management. Answered all of her questions to the best of my ability. She reports she will discuss this with her father and one or the other will call me back.

## 2015-11-17 ENCOUNTER — Other Ambulatory Visit: Payer: Self-pay | Admitting: Radiation Oncology

## 2015-11-17 ENCOUNTER — Telehealth: Payer: Self-pay | Admitting: Radiation Oncology

## 2015-11-17 DIAGNOSIS — C711 Malignant neoplasm of frontal lobe: Secondary | ICD-10-CM

## 2015-11-17 DIAGNOSIS — T8132XD Disruption of internal operation (surgical) wound, not elsewhere classified, subsequent encounter: Secondary | ICD-10-CM

## 2015-11-17 DIAGNOSIS — G8928 Other chronic postprocedural pain: Secondary | ICD-10-CM

## 2015-11-17 MED ORDER — OXYCODONE-ACETAMINOPHEN 5-325 MG PO TABS: 1.0000 | ORAL_TABLET | Freq: Four times a day (QID) | ORAL | Status: DC | PRN

## 2015-11-17 MED FILL — PHENYTOIN SOD EXT 100 MG CA: 100 | 30 days supply | Qty: 90 | Fill #1

## 2015-11-17 NOTE — Telephone Encounter (Signed)
Phoned patient's home. Spoke with patient's daughter, Overton Mam. Informed her that her father's percocet script is ready for pick up in the rad onc nursing station. She verbalized understanding.

## 2015-11-19 MED FILL — OXYCODONE/APAP 5/325MG: 5-325 | 12 days supply | Qty: 90 | Fill #0

## 2015-12-09 ENCOUNTER — Other Ambulatory Visit (HOSPITAL_COMMUNITY): Payer: Self-pay | Admitting: Oncology

## 2015-12-09 DIAGNOSIS — C719 Malignant neoplasm of brain, unspecified: Secondary | ICD-10-CM

## 2015-12-16 ENCOUNTER — Other Ambulatory Visit: Payer: Self-pay | Admitting: Radiation Oncology

## 2015-12-16 ENCOUNTER — Telehealth: Payer: Self-pay | Admitting: Radiation Oncology

## 2015-12-16 DIAGNOSIS — G8928 Other chronic postprocedural pain: Secondary | ICD-10-CM

## 2015-12-16 DIAGNOSIS — T8132XD Disruption of internal operation (surgical) wound, not elsewhere classified, subsequent encounter: Secondary | ICD-10-CM

## 2015-12-16 DIAGNOSIS — C711 Malignant neoplasm of frontal lobe: Secondary | ICD-10-CM

## 2015-12-16 MED ORDER — OXYCODONE-ACETAMINOPHEN 5-325 MG PO TABS: 1.0000 | ORAL_TABLET | Freq: Four times a day (QID) | ORAL | 0 refills | Status: DC | PRN

## 2015-12-16 MED FILL — OXYCODONE/APAP 5/325MG: 5-325 | 11 days supply | Qty: 90 | Fill #0

## 2015-12-16 MED FILL — PHENYTOIN SOD EXT 100 MG CA: 100 | 30 days supply | Qty: 90 | Fill #2

## 2015-12-16 NOTE — Telephone Encounter (Signed)
Phoned patient. No answer. Left message explaining his script is ready for pick up in the radiation oncology nursing area. Left contact number for patient to call back with questions.

## 2015-12-17 ENCOUNTER — Other Ambulatory Visit: Payer: Self-pay | Admitting: Radiation Therapy

## 2015-12-17 DIAGNOSIS — C711 Malignant neoplasm of frontal lobe: Secondary | ICD-10-CM

## 2016-01-07 ENCOUNTER — Other Ambulatory Visit: Payer: Self-pay | Admitting: Radiation Oncology

## 2016-01-07 DIAGNOSIS — C711 Malignant neoplasm of frontal lobe: Secondary | ICD-10-CM

## 2016-01-07 DIAGNOSIS — G8928 Other chronic postprocedural pain: Secondary | ICD-10-CM

## 2016-01-07 DIAGNOSIS — T8132XD Disruption of internal operation (surgical) wound, not elsewhere classified, subsequent encounter: Secondary | ICD-10-CM

## 2016-01-07 MED ORDER — OXYCODONE-ACETAMINOPHEN 5-325 MG PO TABS: 1.0000 | ORAL_TABLET | Freq: Four times a day (QID) | ORAL | 0 refills | Status: DC | PRN

## 2016-01-08 ENCOUNTER — Ambulatory Visit (HOSPITAL_COMMUNITY): Admission: RE | Admit: 2016-01-08 | Payer: Medicaid Other | Source: Ambulatory Visit

## 2016-01-12 ENCOUNTER — Ambulatory Visit: Payer: Medicaid Other | Admitting: Radiation Oncology

## 2016-01-12 MED FILL — metFORMIN HCL 1000 MG TABS: 1000 | 60 days supply | Qty: 90 | Fill #1

## 2016-01-12 MED FILL — OXYCODONE/APAP 5/325 MG TAB: 5-325 | 11 days supply | Qty: 90 | Fill #0

## 2016-01-14 ENCOUNTER — Ambulatory Visit (HOSPITAL_COMMUNITY): Payer: Medicaid Other

## 2016-01-15 ENCOUNTER — Ambulatory Visit (HOSPITAL_COMMUNITY): Payer: Medicaid Other

## 2016-01-19 ENCOUNTER — Ambulatory Visit (HOSPITAL_COMMUNITY): Payer: Medicaid Other

## 2016-01-19 ENCOUNTER — Ambulatory Visit (INDEPENDENT_AMBULATORY_CARE_PROVIDER_SITE_OTHER): Payer: Medicaid Other | Admitting: Internal Medicine

## 2016-01-19 ENCOUNTER — Encounter: Payer: Self-pay | Admitting: Internal Medicine

## 2016-01-19 ENCOUNTER — Ambulatory Visit: Payer: Medicaid Other | Admitting: Radiation Oncology

## 2016-01-19 VITALS — BP 119/79 | HR 87 | Temp 98.4°F | Wt 181.2 lb

## 2016-01-19 DIAGNOSIS — Z7984 Long term (current) use of oral hypoglycemic drugs: Secondary | ICD-10-CM

## 2016-01-19 DIAGNOSIS — C711 Malignant neoplasm of frontal lobe: Secondary | ICD-10-CM

## 2016-01-19 DIAGNOSIS — F17210 Nicotine dependence, cigarettes, uncomplicated: Secondary | ICD-10-CM | POA: Diagnosis not present

## 2016-01-19 DIAGNOSIS — E119 Type 2 diabetes mellitus without complications: Secondary | ICD-10-CM | POA: Diagnosis not present

## 2016-01-19 DIAGNOSIS — Z720 Tobacco use: Secondary | ICD-10-CM

## 2016-01-19 LAB — POCT GLYCOSYLATED HEMOGLOBIN (HGB A1C): Hemoglobin A1C: 6.6

## 2016-01-19 LAB — GLUCOSE, CAPILLARY: Glucose-Capillary: 185 mg/dL — ABNORMAL HIGH (ref 65–99)

## 2016-01-19 NOTE — Assessment & Plan Note (Addendum)
Patient reports a poor diet (fast food) but is compliant with his metformin 1000 mg QHS. A1C today at goal 6.6. CBG 185 but patient says he drank a mountain dew on the way here. Foot exam unremarkable.  -- Continue metformin 1000 mg qhs  -- Check urine microalbumin and BMP today -- Patient has declined annual eye exam

## 2016-01-19 NOTE — Assessment & Plan Note (Addendum)
S/p conventional radiotherapy in May 2015 followed by treatment with Temodar. Patient follows with Dr. Tammi Klippel at [facility] and was recently referred to medical oncology in [place] for recurrence on MRI. Plan is to repeat MRI in October before deciding on further treatment. Patient continues to have daily headaches, mostly at night, but denies other neurological symptoms. No seizure activity. He takes percocet for his headaches but not daily. His pain medicine is prescribed by his oncologist.  -- Plan for repeat MRI Oct. 17th -- Follow up with Dr. Tammi Klippel Oct. 30th then medical oncology in [place]

## 2016-01-19 NOTE — Progress Notes (Signed)
Pct

## 2016-01-19 NOTE — Patient Instructions (Signed)
Please continue to take your Metformin (1000 mg) every night. Please return to clinic for follow up in 3 months. If you have any questions or concerns, call our clinic at [phone]. Thank you!

## 2016-01-19 NOTE — Progress Notes (Signed)
   CC: Follow up   HPI:  [name] is a 50 y.o. M with pmhx of grade II bifrontal astrocytoma and type II DM here for routine follow up. Patient is asymptomatic aside from chronic headaches due to his brain tumor. He follows with Dr. Tammi Klippel at [facility] and was recently referred to a medical oncologist in [place] after MRI revealed recurrence of the tumor. He takes percocet prn for pain. He also endorses marijuana use. Patient initially presented in 2015 after a seizure when he was found to have bifrontal astrocytoma. He underwent conventional radiotherapy in May 2015 followed by treatment with temodar. He is planning for repeat MRI next month before his oncologist decides whether to pursue systemic treatment. For his diabetes, he takes metformin 1000 mg once nightly. He endorses poor compliance with his diet. He drinks mountain dew almost daily and east fast food often. He is asymptomatic. Denies polyuria, polydipsia, nausea, and vomiting. Patient smokes 18 cigarettes a day, down from 2 1/2 PPD. He has no interest in quitting at this time. He denies cough and chest pain.   Past Medical History:  Diagnosis Date  . Astrocytoma of frontal lobe ([place]) 05/31/2013  . DM (diabetes mellitus) ([place]) 08/09/2011   A1c at diagnosis = 12.4  . Family history of breast cancer   . GERD (gastroesophageal reflux disease) 08/09/2011  . Seizure ([place]) 05/19/2013  . Shortness of breath     Review of Systems:  All pertinents listed in HPI, otherwise negative.   Physical Exam:  Vitals:   01/19/16 1343  BP: 119/79  Pulse: 87  Temp: 98.4 F (36.9 C)  TempSrc: Oral  SpO2: 98%  Weight: 82.2 kg (181 lb 3.2 oz)   Constitutional: NAD, appears comfortable HEENT: Atraumatic, normocephalic. PERRL, anicteric sclera.  Neck: Supple, trachea midline.  Cardiovascular: RRR, no murmurs, rubs, or gallops.  Pulmonary/Chest: CTAB, no wheezes, rales, or rhonchi. No chest wall abnormalities.  Abdominal: Soft, non  tender, non distended. +BS.  Extremities: Warm and well perfused. Distal pulses intact. No edema. Bilateral feet without skin lesions or ulceration, sensation intact.  Neurological: A&Ox3, CN II - XII grossly intact.   Skin: No rashes or erythema  Psychiatric: Normal mood and affect  Assessment & Plan:   See Encounters Tab for problem based charting.  Patient seen with Dr. Dareen Piano

## 2016-01-19 NOTE — Assessment & Plan Note (Signed)
Patient reports smoking 18 cigarettes a day, down from 2 1/2 PPD. He has no interest in quitting at this time. He denies chest pain and cough.  -- Continue to assess smoking status and desire to quit

## 2016-01-20 LAB — MICROALBUMIN / CREATININE URINE RATIO
CREATININE, UR: 295.7 mg/dL
MICROALB/CREAT RATIO: 12.5 mg/g creat (ref 0.0–30.0)
Microalbumin, Urine: 36.9 ug/mL

## 2016-01-20 LAB — BMP8+ANION GAP
Anion Gap: 18 mmol/L (ref 10.0–18.0)
BUN/Creatinine Ratio: 25 — ABNORMAL HIGH (ref 9–20)
BUN: 19 mg/dL (ref 6–24)
CALCIUM: 9.6 mg/dL (ref 8.7–10.2)
CHLORIDE: 102 mmol/L (ref 96–106)
CO2: 21 mmol/L (ref 18–29)
CREATININE: 0.75 mg/dL — AB (ref 0.76–1.27)
GFR calc non Af Amer: 108 mL/min/{1.73_m2} (ref >59)
GFR, EST AFRICAN AMERICAN: 125 mL/min/{1.73_m2} (ref >59)
GLUCOSE: 142 mg/dL — AB (ref 65–99)
Potassium: 4.3 mmol/L (ref 3.5–5.2)
Sodium: 141 mmol/L (ref 134–144)

## 2016-01-22 NOTE — Progress Notes (Signed)
Internal Medicine Clinic Attending  I saw and evaluated the patient.  I personally confirmed the key portions of the history and exam documented by Dr. Guilloud and I reviewed pertinent patient test results.  The assessment, diagnosis, and plan were formulated together and I agree with the documentation in the resident's note.  

## 2016-01-26 ENCOUNTER — Ambulatory Visit: Payer: Medicaid Other | Admitting: Radiation Oncology

## 2016-01-29 ENCOUNTER — Encounter: Payer: Self-pay | Admitting: Neurology

## 2016-01-29 ENCOUNTER — Ambulatory Visit (INDEPENDENT_AMBULATORY_CARE_PROVIDER_SITE_OTHER): Payer: Medicaid Other | Admitting: Neurology

## 2016-01-29 VITALS — BP 100/70 | HR 77 | Ht 71.0 in | Wt 179.6 lb

## 2016-01-29 DIAGNOSIS — G40109 Localization-related (focal) (partial) symptomatic epilepsy and epileptic syndromes with simple partial seizures, not intractable, without status epilepticus: Secondary | ICD-10-CM

## 2016-01-29 DIAGNOSIS — C719 Malignant neoplasm of brain, unspecified: Secondary | ICD-10-CM | POA: Diagnosis not present

## 2016-01-29 MED ORDER — PHENYTOIN SODIUM EXTENDED 100 MG PO CAPS: ORAL_CAPSULE | ORAL | 11 refills | Status: DC

## 2016-01-29 NOTE — Patient Instructions (Signed)
1. Continue Dilantin 100mg : Take 3 capsules every morning 2. If headaches worsen that you are taking more pain medication, would recommend starting a daily headache preventative medication 3. If seizures recur, would recommend starting a different seizure medication 4. Continue follow-up with Oncology 5. Follow-up in 6 months, call for any changes  Seizure Precautions: 1. If medication has been prescribed for you to prevent seizures, take it exactly as directed.  Do not stop taking the medicine without talking to your doctor first, even if you have not had a seizure in a long time.   2. Avoid activities in which a seizure would cause danger to yourself or to others.  Don't operate dangerous machinery, swim alone, or climb in high or dangerous places, such as on ladders, roofs, or girders.  Do not drive unless your doctor says you may.  3. If you have any warning that you may have a seizure, lay down in a safe place where you can't hurt yourself.    4.  No driving for 6 months from last seizure, as per [facility].   Please refer to the following link on the [facility] website for more information: http://[email or web address]/[email or web address]/[email or web address]   5.  Maintain good sleep hygiene.  6.  Notify your neurology if you are planning pregnancy or if you become pregnant.  7.  Contact your doctor if you have any problems that may be related to the medicine you are taking.  8.  Call 911 and bring the patient back to the ED if:        A.  The seizure lasts longer than 5 minutes.       B.  The patient doesn't awaken shortly after the seizure  C.  The patient has new problems such as difficulty seeing, speaking or moving  D.  The patient was injured during the seizure  E.  The patient has a temperature over 102 F (39C)  F.  The patient vomited and now is having trouble breathing

## 2016-01-29 NOTE — Progress Notes (Signed)
NEUROLOGY FOLLOW UP OFFICE NOTE  HULAN NUTTALL CR:[id number]  HISTORY OF PRESENT ILLNESS: I had the pleasure of seeing Jonathan Barker in follow-up in the neurology clinic on 01/29/2016. He is again accompanied by his daughter who helps supplement the history today. 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 He and his daughter deny any seizures since January 2015. His Dilantin level was low, but he reported taking only 200mg /day. He is now taking 300mg  every morning without any seizures or seizure-like symptoms, no side effects. Repeat Dilantin level on this dose was again low (<2.5), however since he has been seizure-free, we had agreed to stay on the same dose. He denies any further eye blinking episodes, generalized shaking/stiffening,olfactory/gustatory hallucinations, deja vu, rising epigastric sensation, focal numbness/tingling/weakness, myoclonic jerks. He has been having frequent headaches since the brain biopsy, mostly at night, he is happy with taking one tablet of Percocet before he goes to bed. The headaches are around a 7/10, he usually goes to sleep with the Percocet and wakes up feeling fine. Sleep is good. He denies any dizziness or diplopia. He had a repeat MRI brain on 08/28/15 which I personally reviewed, there is a slight increase in size of the right frontal white matter ring-enhancing 7mm focus (previously 61mm), suggestion of a new 74mm focus of enhancement in the body of the corpus callosum. Punctate focus of enhancement in the right pons is unchanged, confluent white matter T2 hyperintensity in the anterior right frontal lobe is unchanged, extension across the anterior corpus callosum into the left frontal white matter also unchanged. He had seen his medical oncologist in [place] in May who recommended a repeat scan in July to determine further steps, however per patient Medicaid did not approve another MRI until October, he  will see his oncologist after the scan. He drives without difficulties.  HPI 04/15/15: This is a pleasant [name] who had a new onset seizure last 05/18/13 and was found to have an infiltrative mass in the anteromedial right frontal lobe without enhancement, with FLAIR signal extending across the corpus callosum into the contralateral subcortical left frontal lobe. His daughter reports that he started blinking excessively, he recalls feeling his eyes "blinking at 100 mph," he tried to sit but could not because his left hand was locked inside his pocket. He was witnessed to have generalized shaking for less than a minute. He reports that he recalls the entire event, he could hear people screaming around him. His daughter reports that he was coherent right after the seizure, no post-ictal confusion or focal weakness. He was brought to [facility] where imaging revealed the bifrontal mass, biopsy demonstrated diffuse grade 2 astrocytoma. Wake and drowsy EEG was normal. He took Temodar, then underwent radiotherapy until May 2015. He denies any further seizures since January, but does feel that he may have had a seizure the week after hospital discharge in January 2015, he woke up with his arms flexed, unsure if he had dreamed he had a seizure, no associated tongue bite or incontinence. His wife had mentioned around that time that she felt the bed shaking one night. Since January 2015, he denies any further eye blinking episodes, generalized shaking or stiffening of extremities, olfactory/gustatory hallucinations, deja vu, rising epigastric sensation, focal numbness/tingling/weakness, myoclonic jerks. He has had low Dilantin in levels in the past, and reports that he had only been taking it twice a day instead of TID, but with most  recent level checked in November 2016 (<2.5), he reports taking Dilantin 100mg  TID. He denies any side effects to the medication.  He denies any prior history of headaches until brain biopsy,  and since then he has been having frequent headaches. Headaches are over the frontal region, with pressure sensation, no associated nausea, vomiting, photo/phonophobia. He reports headaches occur at the end of the day, 3-4 times a week, with good response to Percocet. He usually takes the medication and goes to bed, with no headache on awakening. He notices more headaches with cold weather. He denies any dizziness, vision changes, dysarthria/dysphagia, neck/back pain, focal numbness/tingling/weakness, bowel/bladder dysfunction. He usually has 4 alcoholic drinks a month. He had a normal birth and early development. There is no history of febrile convulsions, CNS infections such as meningitis/encephalitis, significant traumatic brain injury, or family history of seizures.  I personally reviewed most recent MRI brain with and without contrast done 02/14/15 which showed abnormal FLAIR signal throughout the white matter of the frontal lobes, right more than left, unchanged, with small cystic area in the left frontal cortical/subcortical region that is noted to be slightly larger (29mm) compared to last year. There is a punctate focus of enhancement in the right frontal white matter, 18mm focus of enhancement in the right basal ganglia/external capsule that is new.   PAST MEDICAL HISTORY: Past Medical History:  Diagnosis Date  . Astrocytoma of frontal lobe ([place]) 05/31/2013  . DM (diabetes mellitus) ([place]) 08/09/2011   A1c at diagnosis = 12.4  . Family history of breast cancer   . GERD (gastroesophageal reflux disease) 08/09/2011  . Seizure ([place]) 05/19/2013  . Shortness of breath     MEDICATIONS: Current Outpatient Prescriptions on File Prior to Visit  Medication Sig Dispense Refill  . metFORMIN (GLUCOPHAGE) 1000 MG tablet Take 1 pill in the morning and 1/2 pill in the evening. 90 tablet 12  . nicotine polacrilex (NICORETTE) 4 MG gum Take 1 each (4 mg total) by mouth as needed for smoking cessation. 100 tablet  0  . oxyCODONE-acetaminophen (PERCOCET/ROXICET) 5-325 MG tablet Take 1-2 tablets by mouth every 6 (six) hours as needed for severe pain. 90 tablet 0  Dilantin 100mg      Take 3 tablets every morning No current facility-administered medications on file prior to visit.     ALLERGIES: No Known Allergies  FAMILY HISTORY: Family History  Problem Relation Age of Onset  . Breast cancer Mother     dx in her 48s-60s  . Bone cancer Mother   . Diabetes Father   . Heart disease Father   . Diabetes Brother   . Diabetes Paternal Uncle     SOCIAL HISTORY: Social History   Social History  . Marital status: Significant Other    Spouse name: N/A  . Number of children: 2  . Years of education: N/A   Occupational History  .  [employer]   Social History Main Topics  . Smoking status: Current Every Day Smoker    Packs/day: 1.00    Years: 33.00    Types: Cigarettes  . Smokeless tobacco: Never Used     Comment: Trying to quit. Down to 1 ppd, 10/11/13 down to 1/2 ppd  . Alcohol use 0.0 oz/week     Comment: social  . Drug use:     Types: Marijuana     Comment: Marijuana daily  . Sexual activity: Not on file   Other Topics Concern  . Not on file   Social  History Narrative   [place] the [place]. He does not work now.     REVIEW OF SYSTEMS: Constitutional: No fevers, chills, or sweats, no generalized fatigue, change in appetite Eyes: No visual changes, double vision, eye pain Ear, nose and throat: No hearing loss, ear pain, nasal congestion, sore throat Cardiovascular: No chest pain, palpitations Respiratory:  No shortness of breath at rest or with exertion, wheezes GastrointestinaI: No nausea, vomiting, diarrhea, abdominal pain, fecal incontinence Genitourinary:  No dysuria, urinary retention or frequency Musculoskeletal:  No neck pain, back pain Integumentary: No rash, pruritus, skin lesions Neurological: as above Psychiatric: No depression, insomnia,  anxiety Endocrine: No palpitations, fatigue, diaphoresis, mood swings, change in appetite, change in weight, increased thirst Hematologic/Lymphatic:  No anemia, purpura, petechiae. Allergic/Immunologic: no itchy/runny eyes, nasal congestion, recent allergic reactions, rashes  PHYSICAL EXAM: Vitals:   01/29/16 0859  BP: 100/70  Pulse: 77   General: No acute distress Head:  Normocephalic/atraumatic Neck: supple, no paraspinal tenderness, full range of motion Heart:  Regular rate and rhythm Lungs:  Clear to auscultation bilaterally Back: No paraspinal tenderness Skin/Extremities: No rash, no edema Neurological Exam: alert and oriented to person, place, and time. No aphasia or dysarthria. Fund of knowledge is appropriate.  Remote memory intact.  1/3 delayed recall. Attention and concentration are normal.    Able to name objects and repeat phrases. Cranial nerves: Pupils equal, round, reactive to light. Extraocular movements intact with no nystagmus. Visual fields full. Facial sensation intact. No facial asymmetry. Tongue, uvula, palate midline.  Motor: Bulk and tone normal, muscle strength 5/5 throughout with no pronator drift.  Sensation to light touch intact.  No extinction to double simultaneous stimulation.  Deep tendon reflexes 2+ throughout, toes downgoing.  Finger to nose testing intact.  Gait narrow-based and steady, able to tandem walk adequately.  Romberg negative.  IMPRESSION: This is a pleasant [name] with a history of new onset seizure last January 2015 during which he was found to have a bifrontal (right greater than left) grade II astrocytoma. He underwent Temodar treatment and radiotherapy, follow-up imaging in April 2017 noted increasing focus of nodular enhancement in the right frontal lobe and new focus in the corpus callosum. He has a follow-up scan next month to determine if systemic therapy is indicated. He continues on Dilantin 300mg  daily, again with subtherapeutic  level (<2.5), however no seizures since 2015. We again discussed the option of switching to a different medication that does not require blood level monitoring, he does not want to switch at this time. Since he has been seizure-free, would continue on current dose, they know to call if there are any changes. We discussed the daily headaches and option to start a seizure medication that also helps with headaches (ie Depakote, Topamax), he is happy with the Percocet and denies overuse. He is aware of [place] driving laws to stop driving after a seizure, until 6 months seizure-free. He will follow-up in 6 months and knows to call for any changes.  Thank you for allowing me to participate in his care.  Please do not hesitate to call for any questions or concerns.  The duration of this appointment visit was 15 minutes of face-to-face time with the patient.  Greater than 50% of this time was spent in counseling, explanation of diagnosis, planning of further management, and coordination of care.   Ellouise Newer, M.D.   CC: Dr. Melburn Hake, Dr. Tammi Klippel

## 2016-02-02 MED FILL — PHENYTOIN SOD EXT 100 MG CA: 100 | 30 days supply | Qty: 90 | Fill #3

## 2016-02-09 ENCOUNTER — Other Ambulatory Visit: Payer: Self-pay | Admitting: Radiation Oncology

## 2016-02-09 DIAGNOSIS — G8928 Other chronic postprocedural pain: Secondary | ICD-10-CM

## 2016-02-09 DIAGNOSIS — T8132XD Disruption of internal operation (surgical) wound, not elsewhere classified, subsequent encounter: Secondary | ICD-10-CM

## 2016-02-09 DIAGNOSIS — C711 Malignant neoplasm of frontal lobe: Secondary | ICD-10-CM

## 2016-02-09 MED ORDER — OXYCODONE-ACETAMINOPHEN 5-325 MG PO TABS: 1.0000 | ORAL_TABLET | Freq: Four times a day (QID) | ORAL | 0 refills | Status: DC | PRN

## 2016-02-10 ENCOUNTER — Telehealth: Payer: Self-pay | Admitting: Radiation Oncology

## 2016-02-10 ENCOUNTER — Ambulatory Visit (HOSPITAL_COMMUNITY): Admission: RE | Admit: 2016-02-10 | Payer: Medicaid Other | Source: Ambulatory Visit

## 2016-02-10 MED FILL — OXYCODONE/APAP 5/325 MG TAB: 5-325 | 11 days supply | Qty: 90 | Fill #0

## 2016-02-10 NOTE — Telephone Encounter (Signed)
Phoned patient informing him that his percocet script is ready for pick up in the radiation oncology nursing area. Patient verbalized understanding.

## 2016-02-16 ENCOUNTER — Ambulatory Visit: Payer: Medicaid Other | Admitting: Radiation Oncology

## 2016-02-27 ENCOUNTER — Ambulatory Visit (HOSPITAL_COMMUNITY)
Admission: RE | Admit: 2016-02-27 | Discharge: 2016-02-27 | Disposition: A | Discharge status: Home / Self Care | Payer: Medicaid Other | Source: Ambulatory Visit | Attending: Oncology | Admitting: Oncology

## 2016-02-27 ENCOUNTER — Ambulatory Visit (HOSPITAL_COMMUNITY): Payer: Medicaid Other

## 2016-02-27 DIAGNOSIS — C719 Malignant neoplasm of brain, unspecified: Secondary | ICD-10-CM | POA: Insufficient documentation

## 2016-02-27 MED ORDER — GADOBENATE DIMEGLUMINE 529 MG/ML IV SOLN
17.0000 mL | Freq: Once | INTRAVENOUS | Status: AC | PRN
  Administered 2016-02-27: 17 mL via INTRAVENOUS

## 2016-03-01 ENCOUNTER — Ambulatory Visit
Admission: RE | Admit: 2016-03-01 | Discharge: 2016-03-01 | Disposition: A | Discharge status: Home / Self Care | Payer: Medicaid Other | Source: Ambulatory Visit | Attending: Radiation Oncology | Admitting: Radiation Oncology

## 2016-03-01 ENCOUNTER — Ambulatory Visit: Payer: Medicaid Other | Admitting: Radiation Oncology

## 2016-03-01 DIAGNOSIS — Z8249 Family history of ischemic heart disease and other diseases of the circulatory system: Secondary | ICD-10-CM | POA: Diagnosis not present

## 2016-03-01 DIAGNOSIS — Z7984 Long term (current) use of oral hypoglycemic drugs: Secondary | ICD-10-CM | POA: Diagnosis not present

## 2016-03-01 DIAGNOSIS — C719 Malignant neoplasm of brain, unspecified: Secondary | ICD-10-CM | POA: Diagnosis present

## 2016-03-01 DIAGNOSIS — E119 Type 2 diabetes mellitus without complications: Secondary | ICD-10-CM | POA: Diagnosis not present

## 2016-03-01 DIAGNOSIS — Z79899 Other long term (current) drug therapy: Secondary | ICD-10-CM | POA: Diagnosis not present

## 2016-03-01 DIAGNOSIS — R569 Unspecified convulsions: Secondary | ICD-10-CM | POA: Diagnosis not present

## 2016-03-01 DIAGNOSIS — Z803 Family history of malignant neoplasm of breast: Secondary | ICD-10-CM | POA: Insufficient documentation

## 2016-03-01 DIAGNOSIS — F17210 Nicotine dependence, cigarettes, uncomplicated: Secondary | ICD-10-CM | POA: Diagnosis not present

## 2016-03-01 DIAGNOSIS — K219 Gastro-esophageal reflux disease without esophagitis: Secondary | ICD-10-CM | POA: Diagnosis not present

## 2016-03-01 DIAGNOSIS — Z833 Family history of diabetes mellitus: Secondary | ICD-10-CM | POA: Diagnosis not present

## 2016-03-01 NOTE — Progress Notes (Addendum)
Weight and vitals stable. Denies pain. Reports headaches in the morning and evening, when he is frustrated, and when its cold outside. Denies dizziness, nausea, vomiting, or diplopia. Reports occasional ringing in his ears. Reports poor short term memory. Steady gait noted. Reports moderate fatigue. Scheduled to follow up with Dr. [name] today at 1330. Patient reports he doesn't intend to go to appointment with [facility] due to transportation and financial constraints.   BP 128/90 (BP Location: Left Arm, Patient Position: Sitting, Cuff Size: Normal)   Pulse 84   Resp 16   Wt 180 lb (81.6 kg)   SpO2 100%   BMI 25.10 kg/m  Wt Readings from Last 3 Encounters:  03/01/16 180 lb (81.6 kg)  01/29/16 179 lb 9 oz (81.4 kg)  01/19/16 181 lb 3.2 oz (82.2 kg)

## 2016-03-01 NOTE — Progress Notes (Signed)
  This NP meet briefly with patient and his daughter to introduce the concept and role of Palliative Medicine into a holistic treatment plan.  Jonathan Barker was seen in the OP Radiation-Oncolgy clinic today and was informed of progression of disease.  His treatment options have been complicated by financial stressors.  He is hopeful to f/u with Jonathan Barker in [place] however tells me die to cost of transportaion it is impossible.  His family " is barely  making ends meet"  We discussed the possibility of him f/u with a medical oncologist here at cancer center, he saw Jonathan Barker in the past.  It would be more likely for him to management his OP care closer to home.  He just wants to see Jonathan Barker "one more time"  He spoke to his attempts at getting his disablity activated.  I did speak with the social workers at [facility] and Jonathan Barker) and notified them of the situation.  They are looking into programs that could possibly assist Jonathan Barker.  He and his daughter are encouraged to schedule a time that we can meet again in the future to continue conversation regarding documention of AD/Living Will and discusion of anticipatory care needs   Jonathan Lessen NP  Palliative Medicine Team Team Phone # [phone] Pager [phone]  No charge

## 2016-03-01 NOTE — Progress Notes (Signed)
Radiation Oncology         [phone]  Multidisciplinary Brain and Spine Oncology Clinic Follow-Up Visit Note  CC: Velna Ochs, MD  Ashok Pall, MD  No diagnosis found.         Diagnosis:   50 y.o.  gentleman with a bifrontal (right greater than left) 8.7 cm grade II astrocytoma   Interval Since Last Radiation: 2 years and 6 months  06/18/2013-09/03/2013 conventional radiotherapy in 33 fractions to 59.4 Gy  Narrative:  In summary, Jonathan Barker was treated for a primary brain tumor with surgical resection followed by conventional radiotherapy for his grade 2 astrocytoma, during that time he was also taking Temodar. He has chronic pain from a surgical dehiscence from his craniotomy.  He has been followed closely in the multidisciplinary clinic, and has been referred to Dr. [name]. He is not on any active therapy at this time, and didn't feel for an interval MRI scan on 02/27/2016. Unfortunately this does show increase in size of 3 lesions, a punctate lesion that has been seen on his scan in April,specifically a millimeter of change in a right dental subcortical white matter lesion, a previously noted punctate lesion in the corpus callosum body now measuring 8 mm, and an additional new area of enhancement in the right periventricular white matter posterior to the caudate nucleus measuring 3 mm. Postsurgical changes noted in the right superior frontal cortex. He comes today to discuss these findings, he is due to see Dr. [name] but does not have the ability to get to [facility] today for evaluation.   On review of systems,  he reports that he is doing pretty well overall. He admits to morning and evening headaches that are notable when he experiences stress or changes in temperature. He denies dizziness, nausea, vomiting, or diplopia. He states that he has noticed occasional ringing in his ears, and  poor short term memory and persistant fatigue. He continues to take Percocet for pain  relief and finds that this helps him sleep at night as well.. A complete review of systems is obtained and is otherwise negative.   Past Medical History:  Past Medical History:  Diagnosis Date  . Astrocytoma of frontal lobe ([place]) 05/31/2013  . DM (diabetes mellitus) ([place]) 08/09/2011   A1c at diagnosis = 12.4  . Family history of breast cancer   . GERD (gastroesophageal reflux disease) 08/09/2011  . Seizure ([place]) 05/19/2013  . Shortness of breath     Past Surgical History: Past Surgical History:  Procedure Laterality Date  . APPENDECTOMY    . BRAIN BIOPSY N/A 05/24/2013   Procedure: Craniotomy for open biopsy;  Surgeon: Winfield Cunas, MD;  Location: [facility];  Service: Neurosurgery;  Laterality: N/A;  Craniotomy for open biopsy    Social History:  Social History   Social History  . Marital status: Significant Other    Spouse name: N/A  . Number of children: 2  . Years of education: N/A   Occupational History  .  [employer]   Social History Main Topics  . Smoking status: Current Every Day Smoker    Packs/day: 1.00    Years: 33.00    Types: Cigarettes  . Smokeless tobacco: Never Used     Comment: Trying to quit. Down to 1 ppd, 10/11/13 down to 1/2 ppd  . Alcohol use 0.0 oz/week     Comment: social  . Drug use:     Types: Marijuana     Comment:  Marijuana daily  . Sexual activity: Not on file   Other Topics Concern  . Not on file   Social History Narrative   [place]. He does not work now.     Family History: Family History  Problem Relation Age of Onset  . Breast cancer Mother     dx in her 45s-60s  . Bone cancer Mother   . Diabetes Father   . Heart disease Father   . Diabetes Brother   . Diabetes Paternal Uncle     ALLERGIES:  has No Known Allergies.  Meds: Current Outpatient Prescriptions  Medication Sig Dispense Refill  . metFORMIN (GLUCOPHAGE) 1000 MG tablet Take 1 pill in the morning and 1/2 pill in the  evening. 90 tablet 12  . nicotine polacrilex (NICORETTE) 4 MG gum Take 1 each (4 mg total) by mouth as needed for smoking cessation. 100 tablet 0  . oxyCODONE-acetaminophen (PERCOCET/ROXICET) 5-325 MG tablet Take 1-2 tablets by mouth every 6 (six) hours as needed for severe pain. 90 tablet 0  . phenytoin (DILANTIN) 100 MG ER capsule Take 3 capsules every morning 90 capsule 11   No current facility-administered medications for this encounter.     Physical Findings: Wt Readings from Last 3 Encounters:  03/01/16 180 lb (81.6 kg)  01/29/16 179 lb 9 oz (81.4 kg)  01/19/16 181 lb 3.2 oz (82.2 kg)   Temp Readings from Last 3 Encounters:  01/19/16 98.4 F (36.9 C) (Oral)  09/01/15 97.6 F (36.4 C) (Oral)  08/25/15 97.7 F (36.5 C) (Oral)   BP Readings from Last 3 Encounters:  03/01/16 128/90  01/29/16 100/70  01/19/16 119/79   Pulse Readings from Last 3 Encounters:  03/01/16 84  01/29/16 77  01/19/16 87  In general this is a well appearing caucasian male in no acute distress. He's alert and oriented x4 and appropriate throughout the examination. Cardiopulmonary assessment is negative for acute distress and he exhibits normal effort. No focal neurologic abnormalities are noted.  Lab Findings: Lab Results  Component Value Date   WBC 9.2 11/16/2013   HGB 16.1 11/16/2013   HCT 46.8 11/16/2013   MCV 91.1 11/16/2013   PLT 279 11/16/2013   Radiographic Findings: Mr Jonathan Barker ME Contrast  Result Date: 02/27/2016 CLINICAL DATA:  Grade 2 astrocytoma. Continued surveillance. Status post chemotherapy and radiation. No new symptoms. EXAM: MRI HEAD WITHOUT AND WITH CONTRAST TECHNIQUE: Multiplanar, multiecho pulse sequences of the brain and surrounding structures were obtained without and with intravenous contrast. CONTRAST:  9m MULTIHANCE GADOBENATE DIMEGLUMINE 529 MG/ML IV SOLN COMPARISON:  08/28/2015 most recent. FINDINGS: Brain: Progression of disease related to RIGHT frontal astrocytoma  with contralateral spread. Nodular area of enhancement medial RIGHT frontal subcortical white matter, now 7 mm long axis as compared to 6 mm previously. Nodular area of enhancement within the proximal body of the corpus callosum, previously punctate, now 8 mm long axis. Additional new area of enhancement RIGHT periventricular white matter, posterior to the caudate, 3 mm. No appreciable progression of T2 and FLAIR hyperintensity, greater on the RIGHT. Unchanged ventricular size. No ependymal enhancement. Stable postsurgical change RIGHT superior frontal cortex. Mild BILATERAL pachymeningeal enhancement, stable, representing prior craniotomy. Vascular: Normal flow voids. Skull and upper cervical spine: Normal marrow signal. Sinuses/Orbits: Negative. Other: None IMPRESSION: Progression of infiltrative RIGHT frontal astrocytoma, as described above. Increased size of the previously identified lesions, with a new area of enhancement in the RIGHT periventricular white matter. Stable T2 and FLAIR  hyperintensity throughout the white matter. Electronically Signed   By: Staci Righter M.D.   On: 02/27/2016 15:43     Impression/Plan: 1. 50 y.o. gentlelman with history of WHO grade II astrocytoma with concerns for recurrent disease. Dr. Tammi Klippel reviewed the patient's recent MRI on 02/27/16, which shows some worsening of subcentimeter foci of enhancement of uncertain significance. He may be developing recurrence, transformation to a more aggressive grade, and/or radionecrosis. The enhancing lesions are likely too small for biopsy. We will confirm this with Dr. Christella Noa and reschedule his appointment with Dr. [name] so she can weigh in on his care at this juncture.  2. Lack of transportation due to cost. We will try to coordinate assistance through [place] to help offset his costs for getting to appointments. 3. Chronic headaches and skull pain. We will continue to provide his percocet. He appropriately is using  this regimen as prescribed. We will continue to follow this at subsequent visits. 4. Goals of care. The patient met with Wadie Lessen, NP in palliative care today as well and we appreciate her input moving forward in caring for this gentleman.   In a visit lasting 25 minutes, greater than 50% of the time was spent face to face discussing his MRI scans, reviewing his needs at this time, and coordinating the patient's care.  The above documentation reflects my direct findings during this shared patient visit. Please see the separate note by Dr. Tammi Klippel on this date for the remainder of the patient's plan of care.     Carola Rhine, PAC   This document serves as a record of services personally performed by [name], MD and Shona Simpson, PAC. It was created on their behalf by Bethann Humble, a trained medical scribe. The creation of this record is based on the scribe's personal observations and the provider's statements to them. This document has been checked and approved by the attending provider.

## 2016-03-02 ENCOUNTER — Telehealth: Payer: Self-pay | Admitting: Radiation Therapy

## 2016-03-02 NOTE — Telephone Encounter (Signed)
We have reached out to our social workers here at the [employer] for transportation resources for Mr. Igarashi. Abby Potash informed us that he has Medicaid that provides transportation at no additional cost for him. He just has to call them ahead of time to inform them of the need. The number to call is [phone]. He can also use [employer] if necessary.   I called and left a voicemail with Mr. Bertani daughter, Overton Mam to let her know about this and to share the phone number to call.   Mont Dutton  Special Procedures Navigator

## 2016-03-04 MED FILL — PHENYTOIN SOD EXT 100 MG CA: 100 | 30 days supply | Qty: 90 | Fill #4

## 2016-03-08 ENCOUNTER — Telehealth: Payer: Self-pay | Admitting: Radiation Oncology

## 2016-03-08 NOTE — Telephone Encounter (Signed)
Phoned patient's daughter, Overton Mam, to inquire about follow up with [place] and Medicaid transport. [place] reports they did not pursue Medicaid transportation assistance but, did present for follow up with Dr. [name] last Thursday. Sent fax request for last office note to [facility]. Fax confirmation of deliveyr obtained.

## 2016-03-10 ENCOUNTER — Telehealth: Payer: Self-pay | Admitting: Radiation Oncology

## 2016-03-10 ENCOUNTER — Other Ambulatory Visit: Payer: Self-pay | Admitting: Radiation Oncology

## 2016-03-10 DIAGNOSIS — C711 Malignant neoplasm of frontal lobe: Secondary | ICD-10-CM

## 2016-03-10 DIAGNOSIS — G8928 Other chronic postprocedural pain: Secondary | ICD-10-CM

## 2016-03-10 DIAGNOSIS — T8132XD Disruption of internal operation (surgical) wound, not elsewhere classified, subsequent encounter: Secondary | ICD-10-CM

## 2016-03-10 MED ORDER — OXYCODONE-ACETAMINOPHEN 5-325 MG PO TABS: 1.0000 | ORAL_TABLET | Freq: Four times a day (QID) | ORAL | 0 refills | Status: DC | PRN

## 2016-03-10 MED FILL — OXYCODONE/APAP 5/325 MG TAB: 5-325 | 12 days supply | Qty: 90 | Fill #0

## 2016-03-10 NOTE — Telephone Encounter (Signed)
Phoned patient. No answer. Left message explaining his prescription is ready for pick up in radiation oncology nursing.

## 2016-03-13 IMAGING — MR MR HEAD WO/W CM
10 of 13 series · 32 of 48 positions shown · IV contrast (multihance)
Comparison: MR brain 05/18/2013.  Postoperative CT head 05/25/2013.

CLINICAL DATA: Right frontal grade II astrocytoma, status post
brain biopsy. Current treatment includes completed radiation therapy
and Temodar.

EXAM:
MRI HEAD WITHOUT AND WITH CONTRAST
TECHNIQUE: Multiplanar, multiecho pulse sequences of the brain and surrounding
structures were obtained without and with intravenous contrast.
CONTRAST:  18mL MULTIHANCE GADOBENATE DIMEGLUMINE 529 MG/ML IV SOLN

[Series 3: T1 · sagittal · 5.0mm · 0.47mm/px · 1 of 24 slices shown]
[im 1/24]
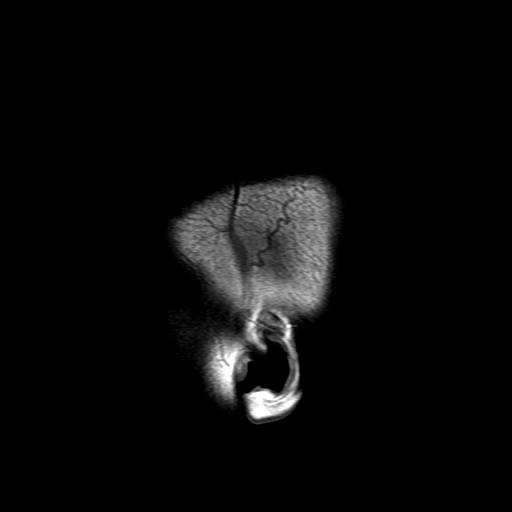

[Series 4: DWI · axial · 5.0mm · 1.09mm/px · z∈[-82,+73]mm · 7 of 64 slices shown (1 of 4)]
[im 1/64]
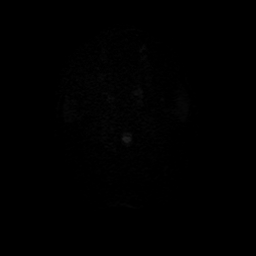
[im 11/64]
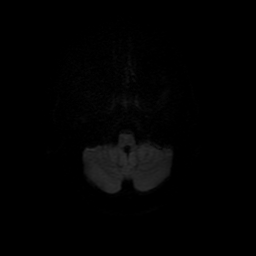
[im 22/64]
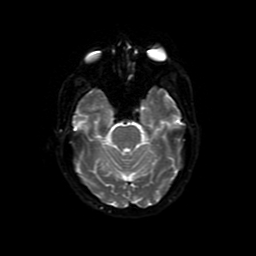
[im 32/64]
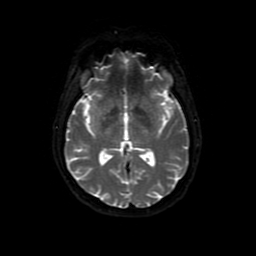
[im 43/64]
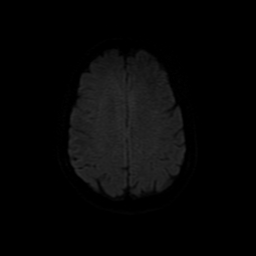
[im 53/64]
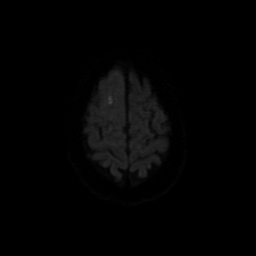
[im 64/64]
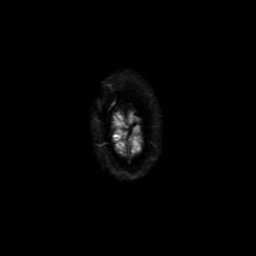

[Series 5: DWI · coronal · 5.0mm · 1.09mm/px · 7 of 64 slices shown (2 of 4)]
[im 1/64]
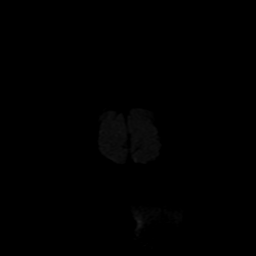
[im 11/64]
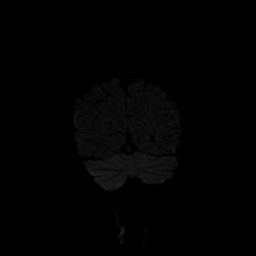
[im 22/64]
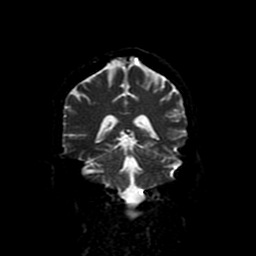
[im 32/64]
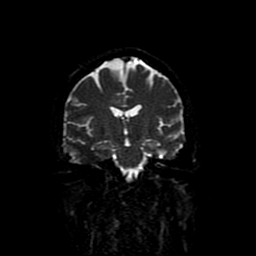
[im 43/64]
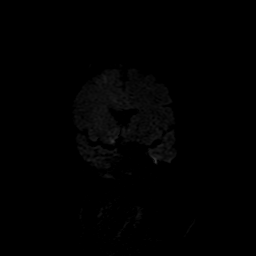
[im 53/64]
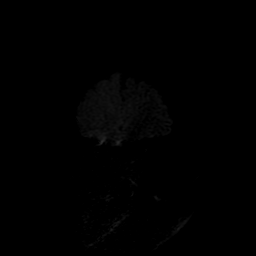
[im 64/64]
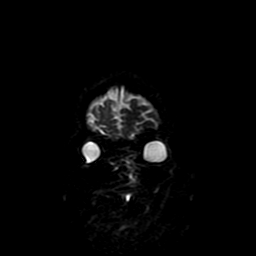

[Series 6: T2 · axial · 5.0mm · 0.43mm/px · z∈[-66,+84]mm · 3 of 24 slices shown]
[im 1/24]
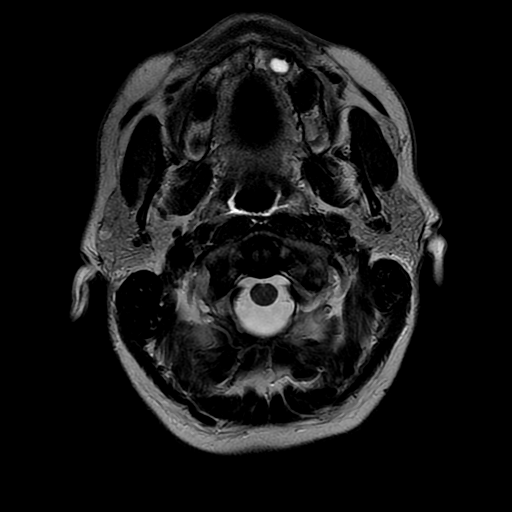
[im 12/24]
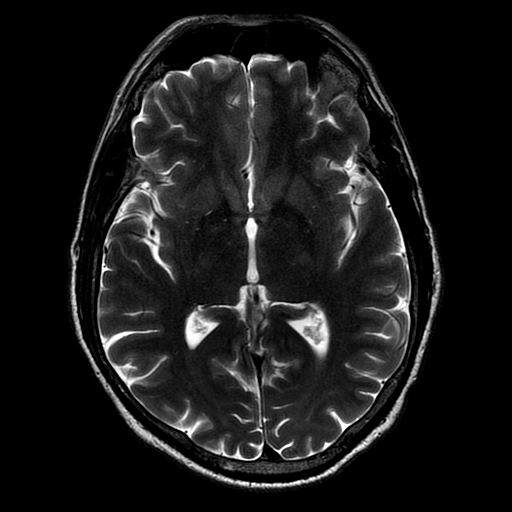
[im 24/24]
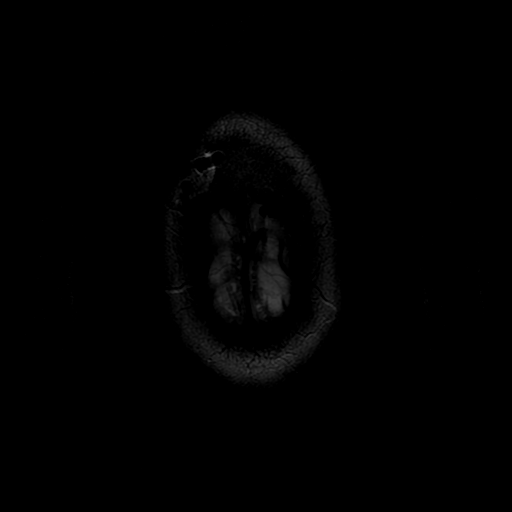

[Series 7: FLAIR · axial · 5.0mm · 0.43mm/px · z∈[-72,+89]mm · 2 of 24 slices shown]
[im 1/24]
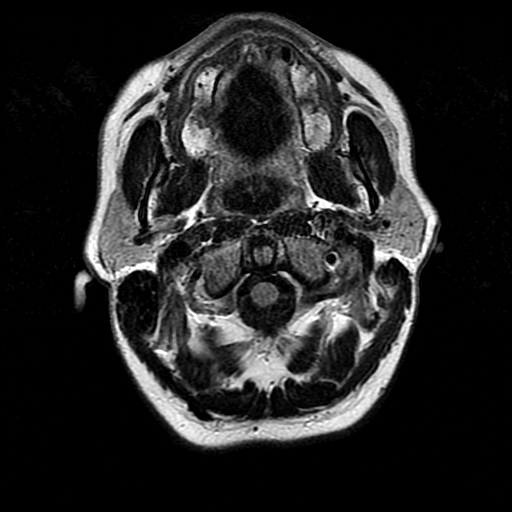
[im 24/24]
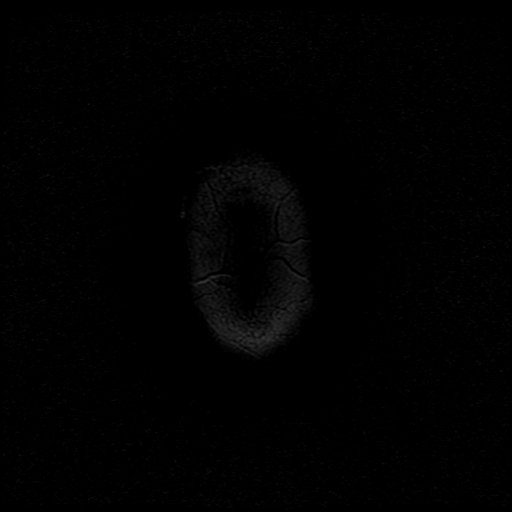

[Series 10: T2 post-contrast · coronal · 5.0mm · 0.45mm/px · 2 of 24 slices shown]
[im 1/24]
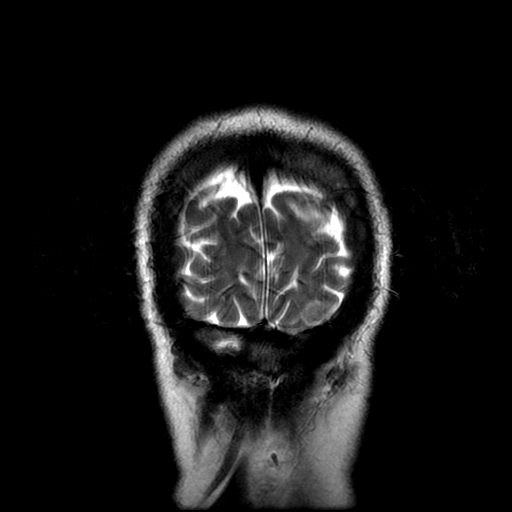
[im 24/24]
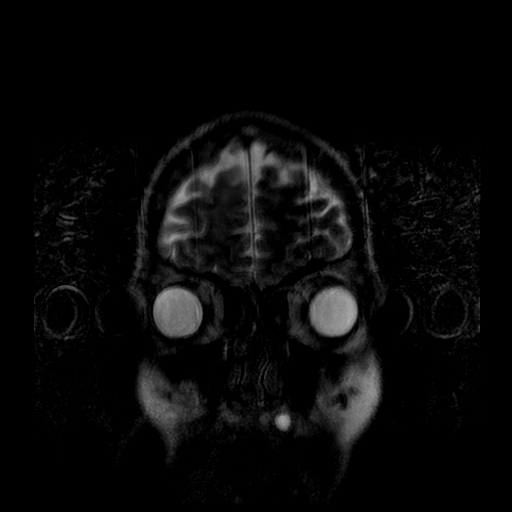

[Series 12: T1 post-contrast · coronal · 5.0mm · 0.45mm/px · 2 of 24 slices shown (1 of 2)]
[im 1/24]
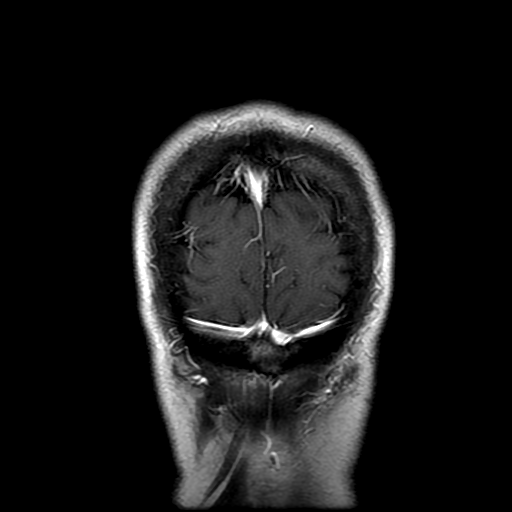
[im 24/24]
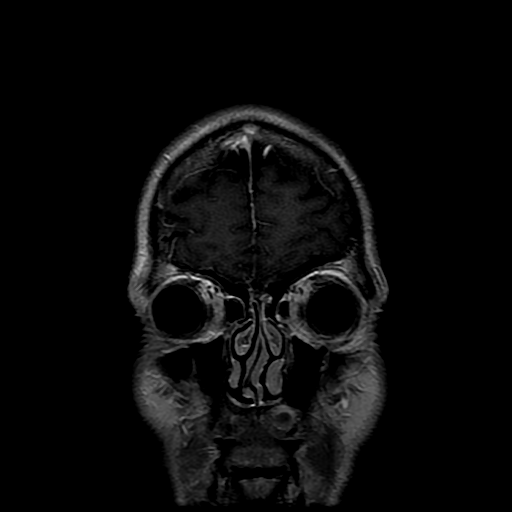

[Series 13: T1 post-contrast · sagittal · 5.0mm · 0.47mm/px · 2 of 24 slices shown (2 of 2)]
[im 1/24]
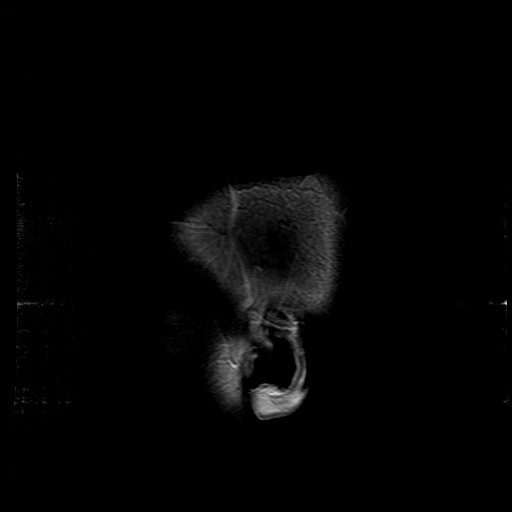
[im 24/24]
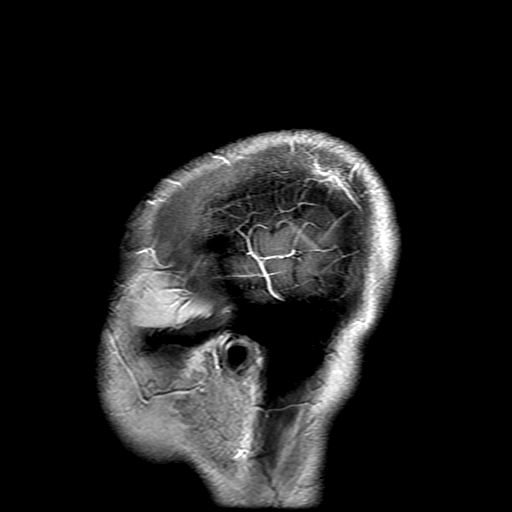

[Series 400: DWI · axial · 5.0mm · 1.09mm/px · z∈[-82,+73]mm · 3 of 32 slices shown (3 of 4)]
[im 1/32]
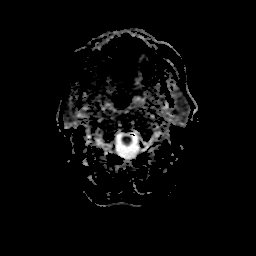
[im 16/32]
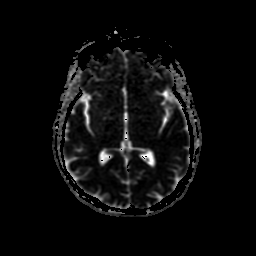
[im 32/32]
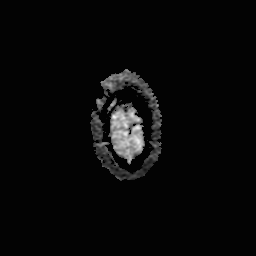

[Series 500: DWI · coronal · 5.0mm · 1.09mm/px · 3 of 32 slices shown (4 of 4)]
[im 1/32]
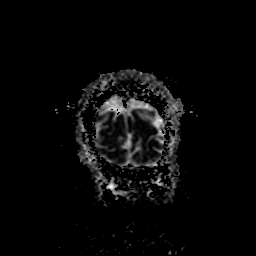
[im 16/32]
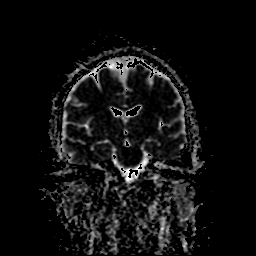
[im 32/32]
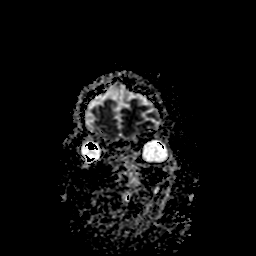

[32 of 48 positions shown; findings below may reference images not displayed]

FINDINGS: Since the prior MR, the patient has undergone right frontal brain
biopsy. This appears uncomplicated. The patient has also completed
radiation therapy.

An infiltrative right frontal mass with contralateral extension to
the left frontal lobe is re-demonstrated. The mass is difficult to
measure exactly due to its infiltrative nature, as well as lack of
peripheral enhancement. Representative measurements are annotated on
the FLAIR sequence. On image 17, the right hemisphere lesion is
approximately 38 x 79 mm. On image 16, the left hemisphere lesion is
approximately 19 x 42 mm. Caudal extension on the right involving
the uncinate fasciculus, external capsule, and insular cortex, shows
asymmetry as far inferiorly as image 12. Overall, when compared with
the prior MR head, the extent of the signal prolongation on FLAIR
imaging appears improved with regard to the right posterior frontal
parasagittal cortex. No significant progression or improvement on
the left. No evidence for pseudoprogression.

Tiny area of susceptibility artifact surrounds the brain biopsy,
consistent with appearance on postoperative CT.

Following administration of contrast, there is a roughly 4 mm focus
of ill-defined enhancement centrally within the right frontal
lesion, best seen on image 38 series 11, image 21 series 12, and
image 10 series 13. This area was probably present previously, but
was obscured by patient motion and poor image quality on the
previous exam (sagittal and coronal images obtained previously were
processed images of low resolution). Attention to this area on
followup with regard to presence or absence of developing
neovascularity is recommended.
IMPRESSION: Stable to improved bifrontal grade 2 astrocytoma with regards to the
extent of T2 and FLAIR signal abnormality. See comments above.

4 mm area of central right frontal enhancement probably stable,
attention to this region on follow-up.

## 2016-04-01 ENCOUNTER — Encounter: Payer: Self-pay | Admitting: *Deleted

## 2016-04-01 NOTE — Progress Notes (Signed)
[place] Work  Clinical Social Work was referred by radiation oncology for assessment of psychosocial needs. Clinical Social Worker met with patient and patient's daughter, Overton Mam, to offer support and assess for needs.  Mr. Iseman shared he is still awaiting Social Security disability approval, he is working with a lawyers office to get this approved.  The patient reported he is being seen at [facility] in [place] and they have not determined treatment plan yet.  CSW reviewed several cancer financial assistance resources, but patient must be in active treatment to qualify.  Patient's daughter plans to follow up with CSW once treatment plan is determined.  CSW and patient completed healthcare advance directives during today's visit. The patient designated [place], daughter, as their primary healthcare agent and Mardene Celeste, friend, as their secondary agent.  Patient also completed healthcare living will.    Clinical Social Worker notarized documents and made copies for patient/family. Clinical Social Worker will send documents to medical records to be scanned into patient's chart. Clinical Social Worker encouraged patient/family to contact with any additional questions or concerns.  Polo Riley, MSW, [place] Worker [facility] [phone]

## 2016-04-05 ENCOUNTER — Other Ambulatory Visit: Payer: Self-pay | Admitting: Oncology

## 2016-04-05 ENCOUNTER — Other Ambulatory Visit (HOSPITAL_COMMUNITY): Payer: Self-pay | Admitting: Oncology

## 2016-04-05 DIAGNOSIS — R490 Dysphonia: Secondary | ICD-10-CM

## 2016-04-05 DIAGNOSIS — R634 Abnormal weight loss: Secondary | ICD-10-CM

## 2016-04-05 DIAGNOSIS — C719 Malignant neoplasm of brain, unspecified: Secondary | ICD-10-CM

## 2016-04-05 MED FILL — PHENYTOIN SOD EXT 100 MG CA: 100 | 30 days supply | Qty: 90 | Fill #5

## 2016-04-07 ENCOUNTER — Telehealth: Payer: Self-pay | Admitting: *Deleted

## 2016-04-07 ENCOUNTER — Other Ambulatory Visit: Payer: Self-pay | Admitting: Radiation Oncology

## 2016-04-07 DIAGNOSIS — C711 Malignant neoplasm of frontal lobe: Secondary | ICD-10-CM

## 2016-04-07 DIAGNOSIS — G8928 Other chronic postprocedural pain: Secondary | ICD-10-CM

## 2016-04-07 DIAGNOSIS — T8132XD Disruption of internal operation (surgical) wound, not elsewhere classified, subsequent encounter: Secondary | ICD-10-CM

## 2016-04-07 MED ORDER — OXYCODONE-ACETAMINOPHEN 5-325 MG PO TABS: 1.0000 | ORAL_TABLET | Freq: Four times a day (QID) | ORAL | 0 refills | Status: DC | PRN

## 2016-04-07 MED FILL — OXYCODONE W/APAP 5/325 TAB: 5-325 | 12 days supply | Qty: 90 | Fill #0

## 2016-04-07 NOTE — Telephone Encounter (Signed)
"  I missed a call from [phone].  Who called and why?"  Prescriptions are ready for pick up.  No further questions.

## 2016-04-07 NOTE — Telephone Encounter (Signed)
Called patient to inform that scripts are  ready for pick-up, spoke with patient's daughterOverton Mam and she is aware scripts are ready.

## 2016-04-12 ENCOUNTER — Encounter (HOSPITAL_COMMUNITY): Payer: Self-pay

## 2016-04-12 ENCOUNTER — Ambulatory Visit (HOSPITAL_COMMUNITY)
Admission: RE | Admit: 2016-04-12 | Discharge: 2016-04-12 | Disposition: A | Discharge status: Home / Self Care | Payer: Medicaid Other | Source: Ambulatory Visit | Attending: Oncology | Admitting: Oncology

## 2016-04-12 ENCOUNTER — Ambulatory Visit (HOSPITAL_COMMUNITY): Admission: RE | Admit: 2016-04-12 | Payer: Medicaid Other | Source: Ambulatory Visit

## 2016-04-12 DIAGNOSIS — R634 Abnormal weight loss: Secondary | ICD-10-CM | POA: Diagnosis present

## 2016-04-12 DIAGNOSIS — R490 Dysphonia: Secondary | ICD-10-CM | POA: Insufficient documentation

## 2016-04-12 DIAGNOSIS — K449 Diaphragmatic hernia without obstruction or gangrene: Secondary | ICD-10-CM | POA: Insufficient documentation

## 2016-04-12 LAB — POCT I-STAT CREATININE: Creatinine, Ser: 0.7 mg/dL (ref 0.61–1.24)

## 2016-04-12 MED ORDER — IOPAMIDOL (ISOVUE-300) INJECTION 61%
INTRAVENOUS | Status: AC
  Administered 2016-04-12: 75 mL
  Filled 2016-04-12: qty 75

## 2016-04-16 ENCOUNTER — Telehealth: Payer: Self-pay | Admitting: Internal Medicine

## 2016-04-16 NOTE — Telephone Encounter (Signed)
APT. REMINDER CALL, LMTCB °

## 2016-04-19 ENCOUNTER — Encounter: Payer: Self-pay | Admitting: Internal Medicine

## 2016-04-27 MED FILL — metFORMIN HCL 1000 MG TABS: 1000 | 30 days supply | Qty: 45 | Fill #2

## 2016-04-29 MED FILL — PENICILLIN VK 500 MG TABLET: 500 | 10 days supply | Qty: 40 | Fill #0

## 2016-04-29 MED FILL — CHLORHEXIDINE 0.12% RINSE: 0.12 | 14 days supply | Qty: 473 | Fill #0

## 2016-04-29 MED FILL — OXYCODONE W/APAP 5/325 TAB: 5-325 | 2 days supply | Qty: 20 | Fill #0

## 2016-05-12 ENCOUNTER — Other Ambulatory Visit: Payer: Self-pay | Admitting: Radiation Therapy

## 2016-05-12 ENCOUNTER — Telehealth: Payer: Self-pay | Admitting: Radiation Therapy

## 2016-05-12 ENCOUNTER — Telehealth: Payer: Self-pay | Admitting: Radiation Oncology

## 2016-05-12 ENCOUNTER — Other Ambulatory Visit: Payer: Self-pay | Admitting: Radiation Oncology

## 2016-05-12 DIAGNOSIS — C711 Malignant neoplasm of frontal lobe: Secondary | ICD-10-CM

## 2016-05-12 DIAGNOSIS — C719 Malignant neoplasm of brain, unspecified: Secondary | ICD-10-CM

## 2016-05-12 DIAGNOSIS — T8132XD Disruption of internal operation (surgical) wound, not elsewhere classified, subsequent encounter: Secondary | ICD-10-CM

## 2016-05-12 DIAGNOSIS — G8928 Other chronic postprocedural pain: Secondary | ICD-10-CM

## 2016-05-12 MED ORDER — OXYCODONE-ACETAMINOPHEN 5-325 MG PO TABS: 1.0000 | ORAL_TABLET | Freq: Four times a day (QID) | ORAL | 0 refills | Status: DC | PRN

## 2016-05-12 MED FILL — PHENYTOIN SOD EXT 100 MG CA: 100 | 30 days supply | Qty: 90 | Fill #6

## 2016-05-12 MED FILL — OXYCODONE W/APAP 5/325 TAB: 5-325 | 11 days supply | Qty: 90 | Fill #0

## 2016-05-12 NOTE — Telephone Encounter (Signed)
Called to inform daughter about the patient's upcoming brain MRI on 1/29 and visit with Dr. Tammi Klippel on 1/31. Left a vm detailing the appointment information and requested a call back to make sure that they will be able to make these appointments.   Mont Dutton R.T.(R)(T) Special Procedures Navigator

## 2016-05-12 NOTE — Telephone Encounter (Signed)
Left message that Percocet script is ready for pick up in radiation oncology nursing.

## 2016-05-13 ENCOUNTER — Telehealth: Payer: Self-pay | Admitting: Radiation Therapy

## 2016-05-13 NOTE — Telephone Encounter (Signed)
Opened in error

## 2016-05-31 ENCOUNTER — Ambulatory Visit (HOSPITAL_COMMUNITY): Admission: RE | Admit: 2016-05-31 | Payer: Medicaid Other | Source: Ambulatory Visit

## 2016-06-02 ENCOUNTER — Inpatient Hospital Stay
Admission: RE | Admit: 2016-06-02 | Discharge: 2016-06-02 | Disposition: A | Discharge status: Home / Self Care | Payer: Self-pay | Source: Ambulatory Visit | Attending: Radiation Oncology | Admitting: Radiation Oncology

## 2016-06-04 ENCOUNTER — Ambulatory Visit
Admission: RE | Admit: 2016-06-04 | Discharge: 2016-06-04 | Disposition: A | Discharge status: Home / Self Care | Payer: Medicaid Other | Source: Ambulatory Visit | Attending: Radiation Oncology | Admitting: Radiation Oncology

## 2016-06-04 DIAGNOSIS — C719 Malignant neoplasm of brain, unspecified: Secondary | ICD-10-CM | POA: Diagnosis not present

## 2016-06-04 DIAGNOSIS — C711 Malignant neoplasm of frontal lobe: Secondary | ICD-10-CM

## 2016-06-04 MED ORDER — GADOBENATE DIMEGLUMINE 529 MG/ML IV SOLN
17.0000 mL | Freq: Once | INTRAVENOUS | Status: AC | PRN
  Administered 2016-06-04: 17 mL via INTRAVENOUS

## 2016-06-06 NOTE — Assessment & Plan Note (Addendum)
S/p surgical resection and conventional radiotherapy in May 2015 followed by treatment with Temodar. Patient follows with Dr. Tammi Klippel at [facility] and was referred to medical oncology in [place] for recurrence on MRI in October 2017. Unfortunately patient was having difficulty affording transportation to [place] and no longer wishes to follow there. He was also referred to palliative care at that time to initiate advance directives and living will. He recently underwent repeat MRI 06/05/2015 that showed stable disease with no evidence of progression. He continues to have daily headaches but denies other neurological symptoms. No seizure activity. He takes percocet for his headaches but not daily. His pain medicine is prescribed by his oncologist.  -- Will continue to follow along and provide support

## 2016-06-06 NOTE — Progress Notes (Signed)
   CC: HTN, DM   HPI:  [name] is a 51 y.o. M with pmhx of grade II bifrontal astrocytoma and type II DM here for routine follow up. He is doing well today and has no complaints. He does have chronic headaches from his brain tumor that are well controlled on prn percocet. He has no other neurological symptoms. He reports compliance with his metformin 1000 mg daily. His diet is poor and he is still smoking but he is not currently interested in smoking cessation or lifestyle modifications. He recently met with palliative care to discuss goals of care. He is in a rush today and does not wish to discuss this with me.   Past Medical History:  Diagnosis Date  . Astrocytoma of frontal lobe ([place]) 05/31/2013  . DM (diabetes mellitus) ([place]) 08/09/2011   A1c at diagnosis = 12.4  . Family history of breast cancer   . GERD (gastroesophageal reflux disease) 08/09/2011  . Seizure ([place]) 05/19/2013  . Shortness of breath     Review of Systems:  All pertinents listed in HPI, otherwise negative.   Physical Exam:  Vitals:   06/07/16 1426  BP: 118/81  Pulse: 80  Temp: 97.7 F (36.5 C)  TempSrc: Oral  SpO2: 100%  Weight: 186 lb 8 oz (84.6 kg)  Height: '5\' 11"'$  (1.803 m)   Constitutional: NAD, appears comfortable, smells of cigarette smoke  Cardiovascular: RRR, no murmurs, rubs, or gallops.  Pulmonary/Chest: CTAB, no wheezes, rales, or rhonchi.  Abdominal: Soft, non tender, non distended. +BS.  Neurological: A&Ox3, CN II - XII grossly intact. Face is symmetric, no deficits. EOM intact. Strength and sensation intact bilaterally.   Assessment & Plan:   See Encounters Tab for problem based charting.  Patient discussed with Dr. Angelia Mould

## 2016-06-06 NOTE — Assessment & Plan Note (Addendum)
Patient reports compliance with his metformin 1000 mg QHS. A1C today is 6.7. Urine microalbumin and BMP checked at last visit with normal renal function and no microalbuminuria.  -- Continue metformin 1000 mg

## 2016-06-07 ENCOUNTER — Encounter: Payer: Self-pay | Admitting: Internal Medicine

## 2016-06-07 ENCOUNTER — Ambulatory Visit
Admission: RE | Admit: 2016-06-07 | Discharge: 2016-06-07 | Disposition: A | Discharge status: Home / Self Care | Payer: Medicaid Other | Source: Ambulatory Visit | Attending: Radiation Oncology | Admitting: Radiation Oncology

## 2016-06-07 ENCOUNTER — Ambulatory Visit (INDEPENDENT_AMBULATORY_CARE_PROVIDER_SITE_OTHER): Payer: Medicaid Other | Admitting: Internal Medicine

## 2016-06-07 DIAGNOSIS — Z923 Personal history of irradiation: Secondary | ICD-10-CM

## 2016-06-07 DIAGNOSIS — E119 Type 2 diabetes mellitus without complications: Secondary | ICD-10-CM | POA: Diagnosis present

## 2016-06-07 DIAGNOSIS — Z7984 Long term (current) use of oral hypoglycemic drugs: Secondary | ICD-10-CM | POA: Diagnosis not present

## 2016-06-07 DIAGNOSIS — C711 Malignant neoplasm of frontal lobe: Secondary | ICD-10-CM | POA: Diagnosis not present

## 2016-06-07 LAB — POCT GLYCOSYLATED HEMOGLOBIN (HGB A1C): Hemoglobin A1C: 6.7

## 2016-06-07 LAB — GLUCOSE, CAPILLARY: GLUCOSE-CAPILLARY: 87 mg/dL (ref 65–99)

## 2016-06-07 NOTE — Patient Instructions (Signed)
Mr. Mellgren,  Please follow up in 3 months or sooner if you need me. If you have any questions or concerns, call our clinic at [phone] or after hours call [phone] and ask for the internal medicine resident on call. Thank you!

## 2016-06-08 NOTE — Progress Notes (Signed)
Internal Medicine Clinic Attending  Case discussed with Dr. Guilloud at the time of the visit.  We reviewed the resident's history and exam and pertinent patient test results.  I agree with the assessment, diagnosis, and plan of care documented in the resident's note.  

## 2016-06-09 ENCOUNTER — Telehealth: Payer: Self-pay | Admitting: Radiation Oncology

## 2016-06-10 ENCOUNTER — Ambulatory Visit
Admission: RE | Admit: 2016-06-10 | Discharge: 2016-06-10 | Disposition: A | Discharge status: Home / Self Care | Payer: Medicare Other | Source: Ambulatory Visit | Attending: Radiation Oncology | Admitting: Radiation Oncology

## 2016-06-10 ENCOUNTER — Telehealth: Payer: Self-pay | Admitting: Radiation Oncology

## 2016-06-10 ENCOUNTER — Encounter: Payer: Self-pay | Admitting: Radiation Oncology

## 2016-06-10 VITALS — BP 140/88 | HR 91 | Temp 97.7°F | Resp 20 | Ht 71.0 in | Wt 188.2 lb

## 2016-06-10 DIAGNOSIS — K219 Gastro-esophageal reflux disease without esophagitis: Secondary | ICD-10-CM | POA: Diagnosis not present

## 2016-06-10 DIAGNOSIS — F1290 Cannabis use, unspecified, uncomplicated: Secondary | ICD-10-CM | POA: Diagnosis not present

## 2016-06-10 DIAGNOSIS — E119 Type 2 diabetes mellitus without complications: Secondary | ICD-10-CM | POA: Diagnosis not present

## 2016-06-10 DIAGNOSIS — G8929 Other chronic pain: Secondary | ICD-10-CM | POA: Diagnosis not present

## 2016-06-10 DIAGNOSIS — F17210 Nicotine dependence, cigarettes, uncomplicated: Secondary | ICD-10-CM | POA: Diagnosis not present

## 2016-06-10 DIAGNOSIS — Z9889 Other specified postprocedural states: Secondary | ICD-10-CM | POA: Insufficient documentation

## 2016-06-10 DIAGNOSIS — G8928 Other chronic postprocedural pain: Secondary | ICD-10-CM | POA: Diagnosis present

## 2016-06-10 DIAGNOSIS — Z803 Family history of malignant neoplasm of breast: Secondary | ICD-10-CM | POA: Diagnosis not present

## 2016-06-10 DIAGNOSIS — Z79891 Long term (current) use of opiate analgesic: Secondary | ICD-10-CM | POA: Diagnosis not present

## 2016-06-10 DIAGNOSIS — Z08 Encounter for follow-up examination after completed treatment for malignant neoplasm: Secondary | ICD-10-CM | POA: Diagnosis not present

## 2016-06-10 DIAGNOSIS — C711 Malignant neoplasm of frontal lobe: Secondary | ICD-10-CM | POA: Diagnosis present

## 2016-06-10 DIAGNOSIS — Z85841 Personal history of malignant neoplasm of brain: Secondary | ICD-10-CM | POA: Diagnosis not present

## 2016-06-10 DIAGNOSIS — R51 Headache: Secondary | ICD-10-CM | POA: Insufficient documentation

## 2016-06-10 DIAGNOSIS — Z7984 Long term (current) use of oral hypoglycemic drugs: Secondary | ICD-10-CM | POA: Diagnosis not present

## 2016-06-10 DIAGNOSIS — T8132XD Disruption of internal operation (surgical) wound, not elsewhere classified, subsequent encounter: Secondary | ICD-10-CM | POA: Insufficient documentation

## 2016-06-10 DIAGNOSIS — X58XXXD Exposure to other specified factors, subsequent encounter: Secondary | ICD-10-CM | POA: Insufficient documentation

## 2016-06-10 DIAGNOSIS — Z923 Personal history of irradiation: Secondary | ICD-10-CM | POA: Insufficient documentation

## 2016-06-10 DIAGNOSIS — C719 Malignant neoplasm of brain, unspecified: Secondary | ICD-10-CM

## 2016-06-10 MED ORDER — OXYCODONE-ACETAMINOPHEN 5-325 MG PO TABS: 1.0000 | ORAL_TABLET | Freq: Four times a day (QID) | ORAL | 0 refills | Status: DC | PRN

## 2016-06-10 MED FILL — OXYCODONE W/APAP 5/325 TAB: 5-325 | 11 days supply | Qty: 90 | Fill #0

## 2016-06-10 MED FILL — metFORMIN HCL 1000 MG TABS: 1000 | 30 days supply | Qty: 45 | Fill #3

## 2016-06-10 MED FILL — PHENYTOIN SOD EXT 100 MG CA: 100 | 30 days supply | Qty: 90 | Fill #7

## 2016-06-10 NOTE — Telephone Encounter (Signed)
Opened in error

## 2016-06-10 NOTE — Telephone Encounter (Signed)
Phoned patient to explain that a follow up with Shona Simpson, PA-C needs to occur before pain medication can be refilled. Intended to offer patient a 1 pm appointment today. Left message requesting return call.

## 2016-06-10 NOTE — Progress Notes (Signed)
Jonathan Barker 51 y.o. man with history of WHO grade II astrocytoma with concerns for recurrent disease radiation completed 09-03-13 review MRI brain w wo contrast 05-31-16 FU.    Headache:Having headache pains  at night ongoing. Dizziness:None Seizures: No Nausea/vomiting:No Visual changes/Diplopia:Wears reading glasses,denies any other problems with his eyes Ringing in ears:None Fatigue:Having fatigue in the afternoons if he walk a lot. Cognitive changes:None, Answered all questions without difficulty. Weight: Wt Readings from Last 3 Encounters:  06/10/16 188 lb 3.2 oz (85.4 kg)  06/07/16 186 lb 8 oz (84.6 kg)  03/01/16 180 lb (81.6 kg)  BP 140/88   Pulse 91   Temp 97.7 F (36.5 C) (Oral)   Resp 20   Ht 5\' 11"  (1.803 m)   Wt 188 lb 3.2 oz (85.4 kg)   SpO2 99%   BMI 26.25 kg/m

## 2016-06-11 DIAGNOSIS — R51 Headache: Secondary | ICD-10-CM

## 2016-06-11 DIAGNOSIS — R519 Headache, unspecified: Secondary | ICD-10-CM | POA: Insufficient documentation

## 2016-06-11 NOTE — Progress Notes (Signed)
Radiation Oncology         [phone]  Multidisciplinary Brain and Spine Oncology Clinic Follow-Up Visit Note  CC: Velna Ochs, MD  Velna Ochs, MD    ICD-9-CM ICD-10-CM   1. Astrocytoma brain tumor ([place]) 191.9 C71.9   2. Astrocytoma of frontal lobe ([facility]) 191.1 C71.1 oxyCODONE-acetaminophen (PERCOCET/ROXICET) 5-325 MG tablet  3. Dehiscence of closure of skull or craniotomy, subsequent encounter V58.89 T81.32XD oxyCODONE-acetaminophen (PERCOCET/ROXICET) 5-325 MG tablet   998.31    4. Chronic postoperative pain 338.28 G89.28 oxyCODONE-acetaminophen (PERCOCET/ROXICET) 5-325 MG tablet  5. Chronic nonintractable headache, unspecified headache type 784.0 R51            Diagnosis:   51 y.o.  gentleman with a bifrontal (right greater than left) 8.7 cm grade II astrocytoma   Interval Since Last Radiation: 2 years and 9 months  06/18/2013-09/03/2013 conventional radiotherapy in 33 fractions to 59.4 Gy  Narrative:  In summary, Jonathan Barker was treated for a primary brain tumor with surgical resection followed by conventional radiotherapy for his grade 2 astrocytoma, during that time he was also taking Temodar. He has chronic pain from a surgical dehiscence from his craniotomy and night time headaches.  He has been followed closely in the multidisciplinary clinic, and has been referred to Dr. [name] in the summer due to an interval MRI scan changes, concerning for progression in the summer of 2017. He met with her and she did not recommend treatment at that time. He had another MRI on 02/27/2016 revealing an increase in size of 3 lesions, a punctate lesion that has been seen on his scan in April,specifically a millimeter of change in a right subcortical white matter lesion, a previously noted punctate lesion in the corpus callosum body now measuring 8 mm, and an additional new area of enhancement in the right periventricular white matter posterior to the caudate nucleus measuring 3 mm.  Postsurgical changes noted in the right superior frontal cortex. Again he did not elect for therapy, as it was not felt to be strongly recommended. He comes today for repeat MRI which was performed on 06/04/16 which revealed stability of three lesions, and contraction of the lesion within the hypothalamus.   On review of systems, the patient reports that he continues to do pretty well overall. He continues to have night time headaches, and takes oxycodone. He takes advil in the morning if his headache is still present. He continues to smoke marijuana as well for headaches which he also reports helps with headaches. This seems to control his symptoms, and he continues to remain stable on his dosing without need for adjustment. He feels otherwise that he is doing well overall. He denies any chest pain, shortness of breath, cough, fevers, chills, night sweats, unintended weight changes. He denies any bowel or bladder disturbances, and denies abdominal pain, nausea or vomiting. He denies any new musculoskeletal or joint aches or pains, new skin lesions or concerns. A complete review of systems is obtained and is otherwise negative.   Past Medical History:  Past Medical History:  Diagnosis Date  . Astrocytoma of frontal lobe ([place]) 05/31/2013  . DM (diabetes mellitus) ([place]) 08/09/2011   A1c at diagnosis = 12.4  . Family history of breast cancer   . GERD (gastroesophageal reflux disease) 08/09/2011  . Seizure ([place]) 05/19/2013  . Shortness of breath     Past Surgical History: Past Surgical History:  Procedure Laterality Date  . APPENDECTOMY    . BRAIN BIOPSY N/A 05/24/2013  Procedure: Craniotomy for open biopsy;  Surgeon: Winfield Cunas, MD;  Location: [facility];  Service: Neurosurgery;  Laterality: N/A;  Craniotomy for open biopsy    Social History:  Social History   Social History  . Marital status: Significant Other    Spouse name: N/A  . Number of children: 2  . Years of education: N/A    Occupational History  .  [employer]   Social History Main Topics  . Smoking status: Current Every Day Smoker    Packs/day: 1.00    Years: 33.00    Types: Cigarettes  . Smokeless tobacco: Never Used     Comment: Trying to quit. Down to 1 ppd, 10/11/13 down to 1/2 ppd  . Alcohol use 0.0 oz/week     Comment: social  . Drug use: Yes    Types: Marijuana     Comment: Marijuana daily  . Sexual activity: Not on file   Other Topics Concern  . Not on file   Social History Narrative   [place]. He does not work now.     Family History: Family History  Problem Relation Age of Onset  . Breast cancer Mother     dx in her 13s-60s  . Bone cancer Mother   . Diabetes Father   . Heart disease Father   . Diabetes Brother   . Diabetes Paternal Uncle     ALLERGIES:  has No Known Allergies.  Meds: Current Outpatient Prescriptions  Medication Sig Dispense Refill  . metFORMIN (GLUCOPHAGE) 1000 MG tablet Take 1 pill in the morning and 1/2 pill in the evening. 90 tablet 12  . nicotine polacrilex (NICORETTE) 4 MG gum Take 1 each (4 mg total) by mouth as needed for smoking cessation. 100 tablet 0  . oxyCODONE-acetaminophen (PERCOCET/ROXICET) 5-325 MG tablet Take 1-2 tablets by mouth every 6 (six) hours as needed for severe pain. 90 tablet 0  . phenytoin (DILANTIN) 100 MG ER capsule Take 3 capsules every morning 90 capsule 11   No current facility-administered medications for this encounter.     Physical Findings: Wt Readings from Last 3 Encounters:  06/10/16 188 lb 3.2 oz (85.4 kg)  06/07/16 186 lb 8 oz (84.6 kg)  03/01/16 180 lb (81.6 kg)   Temp Readings from Last 3 Encounters:  06/10/16 97.7 F (36.5 C) (Oral)  06/07/16 97.7 F (36.5 C) (Oral)  01/19/16 98.4 F (36.9 C) (Oral)   BP Readings from Last 3 Encounters:  06/10/16 140/88  06/07/16 118/81  03/01/16 128/90   Pulse Readings from Last 3 Encounters:  06/10/16 91  06/07/16 80   03/01/16 84  In general this is a well appearing caucasian male in no acute distress. He's alert and oriented x4 and appropriate throughout the examination. Cardiopulmonary assessment is negative for acute distress and he exhibits normal effort. No focal neurologic abnormalities are noted.  Lab Findings: Lab Results  Component Value Date   WBC 9.2 11/16/2013   HGB 16.1 11/16/2013   HCT 46.8 11/16/2013   MCV 91.1 11/16/2013   PLT 279 11/16/2013   Radiographic Findings: Jonathan Barker ME Contrast  Result Date: 06/04/2016 CLINICAL DATA:  Astrocytoma status post radiation therapy. EXAM: MRI HEAD WITHOUT AND WITH CONTRAST TECHNIQUE: Multiplanar, multiecho pulse sequences of the brain and surrounding structures were obtained without and with intravenous contrast. CONTRAST:  59m MULTIHANCE GADOBENATE DIMEGLUMINE 529 MG/ML IV SOLN COMPARISON:  Brain MRI 02/27/2016 FINDINGS: Brain: No focal diffusion restriction  to indicate acute infarct. No intraparenchymal hemorrhage. Hyperintense T2 weighted signal within the white matter of the right frontal lobe extending across the genu of the corpus callosum to the left frontal lobe is unchanged in distribution and extent. There are multiple punctate foci of contrast enhancement again identified within the right frontal lobe and the body of the corpus callosum. The most anterior lesion is unchanged in size, measuring 6.8 mm. The corpus callosum lesion has decreased in size slightly, now measuring 5 mm, previously 7.6 mm. Small lesion in the right periventricular white matter is unchanged in size. There are no new lesions. 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 No hydrocephalus or extra-axial fluid collection. The midline structures are normal. No age advanced or lobar predominant atrophy. Vascular: Major intracranial arterial and venous sinus flow voids are preserved. No evidence of  chronic microhemorrhage or amyloid angiopathy. Skull and upper cervical spine: Remote right frontal craniotomy. Sinuses/Orbits: No fluid levels or advanced mucosal thickening. No mastoid effusion. Normal orbits. IMPRESSION: No evidence of disease progression. Slight decrease in size of contrast-enhancing lesion within the body of the corpus callosum. Otherwise unchanged examination. Electronically Signed   By: Ulyses Jarred M.D.   On: 06/04/2016 20:57     Impression/Plan: 1. 51 y.o. gentlelman with history of WHO grade II astrocytoma. The patient is radiographically stable without visible signs of progression. He is not able to continue to travel to [place] for evaluations with neuro oncology. We anticipate having a neuro oncologist in the area in the next 6 months, and will refer him as well for evaluation at that time. We will otherwise proceed with repeat brain imaging in 2 months. He will keep [place] informed of any concerns prior to that. 2. Chronic headaches and skull pain. We will continue to provide his percocet. He appropriately is using this regimen as prescribed. We will continue to follow this at subsequent visits.      Carola Rhine, PAC

## 2016-06-30 ENCOUNTER — Encounter: Payer: Self-pay | Admitting: Oncology

## 2016-07-08 ENCOUNTER — Telehealth: Payer: Self-pay | Admitting: Radiation Oncology

## 2016-07-08 ENCOUNTER — Other Ambulatory Visit: Payer: Self-pay | Admitting: Radiation Oncology

## 2016-07-08 DIAGNOSIS — C711 Malignant neoplasm of frontal lobe: Secondary | ICD-10-CM

## 2016-07-08 DIAGNOSIS — G8928 Other chronic postprocedural pain: Secondary | ICD-10-CM

## 2016-07-08 DIAGNOSIS — T8132XD Disruption of internal operation (surgical) wound, not elsewhere classified, subsequent encounter: Secondary | ICD-10-CM

## 2016-07-08 MED ORDER — OXYCODONE-ACETAMINOPHEN 5-325 MG PO TABS: 1.0000 | ORAL_TABLET | Freq: Four times a day (QID) | ORAL | 0 refills | Status: DC | PRN

## 2016-07-08 MED FILL — OXYCODONE/APAP 5/325 MG TAB: 5-325 | 11 days supply | Qty: 90 | Fill #0

## 2016-07-08 NOTE — Telephone Encounter (Signed)
Phoned patient and explained his script is ready for pick up in the radiation oncology nursing area. Patient verbalized understanding.

## 2016-07-14 MED FILL — PHENYTOIN SOD EXT 100 MG CA: 100 | 30 days supply | Qty: 90 | Fill #8

## 2016-07-30 ENCOUNTER — Other Ambulatory Visit: Payer: Self-pay | Admitting: Radiation Therapy

## 2016-07-30 DIAGNOSIS — C719 Malignant neoplasm of brain, unspecified: Secondary | ICD-10-CM

## 2016-08-04 ENCOUNTER — Encounter (HOSPITAL_COMMUNITY): Payer: Self-pay

## 2016-08-05 ENCOUNTER — Telehealth: Payer: Self-pay | Admitting: Radiation Oncology

## 2016-08-05 ENCOUNTER — Other Ambulatory Visit: Payer: Self-pay | Admitting: Radiation Oncology

## 2016-08-05 DIAGNOSIS — C711 Malignant neoplasm of frontal lobe: Secondary | ICD-10-CM

## 2016-08-05 DIAGNOSIS — T8132XD Disruption of internal operation (surgical) wound, not elsewhere classified, subsequent encounter: Secondary | ICD-10-CM

## 2016-08-05 DIAGNOSIS — G8928 Other chronic postprocedural pain: Secondary | ICD-10-CM

## 2016-08-05 MED ORDER — OXYCODONE-ACETAMINOPHEN 5-325 MG PO TABS: 1.0000 | ORAL_TABLET | Freq: Four times a day (QID) | ORAL | 0 refills | Status: DC | PRN

## 2016-08-05 MED FILL — OXYCODONE/APAP 5/325 MG TAB: 5-325 | 11 days supply | Qty: 90 | Fill #0

## 2016-08-05 MED FILL — PHENYTOIN SOD EXT 100 MG CA: 100 | 30 days supply | Qty: 90 | Fill #9

## 2016-08-05 NOTE — Telephone Encounter (Signed)
Phoned patient making him aware his Percocet script is ready for pick up in the radiation oncology nursing area. Patient verbalized understanding.

## 2016-08-11 ENCOUNTER — Encounter (HOSPITAL_COMMUNITY): Payer: Self-pay | Admitting: *Deleted

## 2016-08-11 ENCOUNTER — Emergency Department (HOSPITAL_COMMUNITY): Payer: Medicare Other

## 2016-08-11 ENCOUNTER — Ambulatory Visit: Payer: Self-pay | Admitting: Neurology

## 2016-08-11 ENCOUNTER — Emergency Department (HOSPITAL_COMMUNITY)
Admission: EM | Admit: 2016-08-11 | Discharge: 2016-08-11 | Disposition: A | Discharge status: Home / Self Care | Payer: Medicare Other | Attending: Emergency Medicine | Admitting: Emergency Medicine

## 2016-08-11 DIAGNOSIS — M545 Low back pain, unspecified: Secondary | ICD-10-CM

## 2016-08-11 DIAGNOSIS — Z7984 Long term (current) use of oral hypoglycemic drugs: Secondary | ICD-10-CM | POA: Insufficient documentation

## 2016-08-11 DIAGNOSIS — F17210 Nicotine dependence, cigarettes, uncomplicated: Secondary | ICD-10-CM | POA: Insufficient documentation

## 2016-08-11 DIAGNOSIS — M5126 Other intervertebral disc displacement, lumbar region: Secondary | ICD-10-CM | POA: Diagnosis not present

## 2016-08-11 DIAGNOSIS — Z79899 Other long term (current) drug therapy: Secondary | ICD-10-CM | POA: Insufficient documentation

## 2016-08-11 DIAGNOSIS — M549 Dorsalgia, unspecified: Secondary | ICD-10-CM | POA: Diagnosis not present

## 2016-08-11 DIAGNOSIS — E119 Type 2 diabetes mellitus without complications: Secondary | ICD-10-CM | POA: Insufficient documentation

## 2016-08-11 DIAGNOSIS — M546 Pain in thoracic spine: Secondary | ICD-10-CM | POA: Diagnosis not present

## 2016-08-11 MED ORDER — METHOCARBAMOL 500 MG PO TABS: 500.0000 mg | ORAL_TABLET | Freq: Two times a day (BID) | ORAL | 0 refills | Status: DC

## 2016-08-11 MED ORDER — NAPROXEN 500 MG PO TABS: 500.0000 mg | ORAL_TABLET | Freq: Two times a day (BID) | ORAL | 0 refills | Status: DC

## 2016-08-11 MED FILL — NAPROXEN 500 MG TABLET: 500 | 15 days supply | Qty: 30 | Fill #0

## 2016-08-11 MED FILL — METHOCARBAMOL 500 MG TABLET: 500 | 10 days supply | Qty: 20 | Fill #0

## 2016-08-11 NOTE — ED Notes (Signed)
Pt is ambulatory in the department.  Inquired about discharge as he has a 4pm neurology apointment

## 2016-08-11 NOTE — Discharge Instructions (Signed)
Begin taking Robaxin and naproxen as needed for pain and inflammation. Follow-up with primary care for further evaluation if needed. Return to ED for worsening pain, numbness, weakness, loss of bladder function, additional injury, trouble breathing.

## 2016-08-11 NOTE — ED Triage Notes (Signed)
Pt complains of lower back pain since bending over and coughing yesterday. Pt states the pain is worse with bending at the back.

## 2016-08-11 NOTE — ED Provider Notes (Signed)
[facility] DEPT Provider Note   CSN: [id number] Arrival date & time: 08/11/16  1252  By signing my name below, I, [place], attest that this documentation has been prepared under the direction and in the presence of Jonathan Ignasiak, PA-C. Electronically Signed: Ethelle Lyon Long, Scribe. 08/11/2016. 1:31 PM.  History   Chief Complaint Chief Complaint  Patient presents with  . Back Pain   The history is provided by the patient and medical records. No language interpreter was used.    HPI Comments:  Jonathan Barker is a 51 y.o. male with a PMHx of Seizure, DM, Astrocytoma of Frontal Lobe, and GERD, who presents to the Emergency Department complaining of sudden onset, non-radiating, lower back pain beginning yesterday morning. Pt reports bending down to use the toilet yesterday morning when the lower back pain arose. He states the pain gradually worsened throughout the day yesterday with it worsening further this morning upon bending down. No h/o back pain PTA. Bending over and exertion exacerbates his pain while bending backwards somewhat alleviates his pain. Pt denies numbness, tingling, incontinence of bladder/bowel, abdominal pain, CP, dysuria, hematuria, and any other complaints at this time. He has an upcoming MRI appointment to f/u with his CA on 08/28/16.    Past Medical History:  Diagnosis Date  . Astrocytoma of frontal lobe ([place]) 05/31/2013  . DM (diabetes mellitus) ([place]) 08/09/2011   A1c at diagnosis = 12.4  . Family history of breast cancer   . GERD (gastroesophageal reflux disease) 08/09/2011  . Seizure ([place]) 05/19/2013  . Shortness of breath    Patient Active Problem List   Diagnosis Date Noted  . Chronic headaches 06/11/2016  . Astrocytoma brain tumor ([place]) 07/29/2015  . Localization-related symptomatic epilepsy and epileptic syndromes with simple partial seizures, not intractable, without status epilepticus ([place]) 04/15/2015  . Healthcare maintenance 02/10/2015  .  Astrocytoma of frontal lobe ([place]) 05/31/2013  . Tobacco abuse 09/09/2011  . Diabetes type 2, controlled ([place]) 08/09/2011  . HLD (hyperlipidemia) 08/09/2011   Past Surgical History:  Procedure Laterality Date  . APPENDECTOMY    . BRAIN BIOPSY N/A 05/24/2013   Procedure: Craniotomy for open biopsy;  Surgeon: Winfield Cunas, MD;  Location: [facility];  Service: Neurosurgery;  Laterality: N/A;  Craniotomy for open biopsy    Home Medications    Prior to Admission medications   Medication Sig Start Date End Date Taking? Authorizing Provider  metFORMIN (GLUCOPHAGE) 1000 MG tablet Take 1 pill in the morning and 1/2 pill in the evening. 08/25/15   Loleta Chance, MD  methocarbamol (ROBAXIN) 500 MG tablet Take 1 tablet (500 mg total) by mouth 2 (two) times daily. 08/11/16   Imari Reen, PA-C  naproxen (NAPROSYN) 500 MG tablet Take 1 tablet (500 mg total) by mouth 2 (two) times daily. 08/11/16   Darcell Yacoub, PA-C  nicotine polacrilex (NICORETTE) 4 MG gum Take 1 each (4 mg total) by mouth as needed for smoking cessation. 08/25/15   Loleta Chance, MD  oxyCODONE-acetaminophen (PERCOCET/ROXICET) 5-325 MG tablet Take 1-2 tablets by mouth every 6 (six) hours as needed for severe pain. 08/05/16   Hayden Pedro, PA-C  phenytoin (DILANTIN) 100 MG ER capsule Take 3 capsules every morning 01/29/16   Cameron Sprang, MD    Family History Family History  Problem Relation Age of Onset  . Breast cancer Mother     dx in her 107s-60s  . Bone cancer Mother   . Diabetes Father   .  Heart disease Father   . Diabetes Brother   . Diabetes Paternal Uncle     Social History Social History  Substance Use Topics  . Smoking status: Current Every Day Smoker    Packs/day: 1.00    Years: 33.00    Types: Cigarettes  . Smokeless tobacco: Never Used     Comment: Trying to quit. Down to 1 ppd, 10/11/13 down to 1/2 ppd  . Alcohol use 0.0 oz/week     Comment: social     Allergies   Patient has no known  allergies.   Review of Systems Review of Systems  Cardiovascular: Negative for chest pain.  Gastrointestinal: Negative for abdominal pain.  Genitourinary: Negative for dysuria and hematuria.       Negative incontinence of bladder/bowel   Musculoskeletal: Positive for back pain.  Neurological: Negative for numbness.  All other systems reviewed and are negative.    Physical Exam Updated Vital Signs BP 126/90 (BP Location: Left Arm)   Pulse 98   Resp 18   SpO2 100%   Physical Exam  Constitutional: He appears well-developed and well-nourished. No distress.  HENT:  Head: Normocephalic and atraumatic.  Eyes: Conjunctivae and EOM are normal. No scleral icterus.  Neck: Normal range of motion.  Cardiovascular: Normal heart sounds.   Pulmonary/Chest: Effort normal and breath sounds normal. No respiratory distress.  Musculoskeletal: He exhibits no edema or tenderness.  Paraspinal tenderness in thoracic and lumbar area.  Neurological: He is alert. He exhibits normal muscle tone. Coordination normal.  Skin: No rash noted. He is not diaphoretic.  Psychiatric: He has a normal mood and affect.  Nursing note and vitals reviewed.    ED Treatments / Results  DIAGNOSTIC STUDIES:  Oxygen Saturation is 100% on RA, normal by my interpretation.    COORDINATION OF CARE:  1:32 PM Discussed treatment plan with pt at bedside including CT Lumbar and Thoracic spine and pt agreed to plan.  Labs (all labs ordered are listed, but only abnormal results are displayed) Labs Reviewed - No data to display  EKG  EKG Interpretation None       Radiology Ct Thoracic Spine Wo Contrast  Result Date: 08/11/2016 CLINICAL DATA:  Low back pain. History of cancer. Patient felt like he pulled a muscle on the toilet yesterday. Initial encounter. History of astrocytoma diagnosed in 2015. Chemotherapy and radiotherapy EXAM: CT THORACIC AND LUMBAR SPINE WITHOUT CONTRAST TECHNIQUE: Multidetector CT imaging of  the thoracic and lumbar spine was performed without contrast. Multiplanar CT image reconstructions were also generated. COMPARISON:  Chest CT 04/12/2016.  Lumbar spine CT 09/21/2005 FINDINGS: CT THORACIC SPINE FINDINGS Alignment: Exaggerated kyphosis. Vertebrae: Negative for fracture. No evidence of aggressive bone lesion. Paraspinal and other soft tissues: No acute finding. Left coronary atherosclerotic calcification, age unexpected. Disc levels: Mild endplate spurring. No visible herniation or impingement. CT LUMBAR SPINE FINDINGS Segmentation: 5 lumbar type vertebrae. Alignment: Normal. Vertebrae: No acute fracture or focal pathologic process. Paraspinal and other soft tissues: Aortic atherosclerosis. Disc levels: Mild disc bulging at L3-4 and below. No evidence of impingement. IMPRESSION: CT THORACIC SPINE IMPRESSION 1. No acute finding or evidence of impingement. 2. Coronary atherosclerosis, unexpected for age. CT LUMBAR SPINE IMPRESSION 1. No acute finding or evidence of impingement. 2. Mild disc bulging at L3-4 and below. Electronically Signed   By: Monte Fantasia M.D.   On: 08/11/2016 15:11   Ct Lumbar Spine Wo Contrast  Result Date: 08/11/2016 CLINICAL DATA:  Low back pain. History of cancer.  Patient felt like he pulled a muscle on the toilet yesterday. Initial encounter. History of astrocytoma diagnosed in 2015. Chemotherapy and radiotherapy EXAM: CT THORACIC AND LUMBAR SPINE WITHOUT CONTRAST TECHNIQUE: Multidetector CT imaging of the thoracic and lumbar spine was performed without contrast. Multiplanar CT image reconstructions were also generated. COMPARISON:  Chest CT 04/12/2016.  Lumbar spine CT 09/21/2005 FINDINGS: CT THORACIC SPINE FINDINGS Alignment: Exaggerated kyphosis. Vertebrae: Negative for fracture. No evidence of aggressive bone lesion. Paraspinal and other soft tissues: No acute finding. Left coronary atherosclerotic calcification, age unexpected. Disc levels: Mild endplate spurring. No  visible herniation or impingement. CT LUMBAR SPINE FINDINGS Segmentation: 5 lumbar type vertebrae. Alignment: Normal. Vertebrae: No acute fracture or focal pathologic process. Paraspinal and other soft tissues: Aortic atherosclerosis. Disc levels: Mild disc bulging at L3-4 and below. No evidence of impingement. IMPRESSION: CT THORACIC SPINE IMPRESSION 1. No acute finding or evidence of impingement. 2. Coronary atherosclerosis, unexpected for age. CT LUMBAR SPINE IMPRESSION 1. No acute finding or evidence of impingement. 2. Mild disc bulging at L3-4 and below. Electronically Signed   By: Monte Fantasia M.D.   On: 08/11/2016 15:11    Procedures Procedures (including critical care time)  Medications Ordered in ED Medications - No data to display   Initial Impression / Assessment and Plan / ED Course  I have reviewed the triage vital signs and the nursing notes.  Pertinent labs & imaging results that were available during my care of the patient were reviewed by me and considered in my medical decision making (see chart for details).     Patient with a history of astrocytoma presents with back pain that began after bending today.  CT of lumbar and thoracic spine showed no acute abnormalities or findings. Only paraspinal tenderness with known mechanism of possible injury. No neurological deficits and normal neuro exam.  Patient is ambulatory.  No loss of bowel or bladder control.  No concern for cauda equina.  No concern of this type of cancer to metastasize. No fever, night sweats, weight loss, IVDA, no recent procedure to back. No urinary symptoms suggestive of UTI.  Supportive care and return precaution discussed. Appears safe for discharge at this time. Follow up as indicated in discharge paperwork.    Final Clinical Impressions(s) / ED Diagnoses   Final diagnoses:  Bilateral low back pain without sciatica, unspecified chronicity    New Prescriptions Discharge Medication List as of  08/11/2016  3:36 PM    START taking these medications   Details  methocarbamol (ROBAXIN) 500 MG tablet Take 1 tablet (500 mg total) by mouth 2 (two) times daily., Starting Wed 08/11/2016, Print    naproxen (NAPROSYN) 500 MG tablet Take 1 tablet (500 mg total) by mouth 2 (two) times daily., Starting Wed 08/11/2016, Print       I personally performed the services described in this documentation, which was scribed in my presence. The recorded information has been reviewed and is accurate.     Delia Heady, PA-C 08/11/16 2152    Sharlett Iles, MD 08/12/16 [phone]

## 2016-08-16 ENCOUNTER — Ambulatory Visit: Payer: Self-pay | Admitting: Radiation Oncology

## 2016-08-18 ENCOUNTER — Ambulatory Visit
Admission: RE | Admit: 2016-08-18 | Discharge: 2016-08-18 | Disposition: A | Discharge status: Home / Self Care | Payer: Medicare Other | Source: Ambulatory Visit | Attending: Radiation Oncology | Admitting: Radiation Oncology

## 2016-08-18 DIAGNOSIS — C719 Malignant neoplasm of brain, unspecified: Secondary | ICD-10-CM | POA: Diagnosis not present

## 2016-08-18 MED ORDER — GADOBENATE DIMEGLUMINE 529 MG/ML IV SOLN
20.0000 mL | Freq: Once | INTRAVENOUS | Status: AC | PRN
  Administered 2016-08-18: 18 mL via INTRAVENOUS

## 2016-08-23 NOTE — Progress Notes (Signed)
Jonathan Barker 51 y.o.man with history of WHO grade II astrocytoma with concerns for recurrent disease radiation completed 09-03-13 review 08-18-16 MRI brain w wo contrast  FU.    Headache:Having night headaches.  Dizziness:None Seizures: No on Dilantin Nausea/vomiting:No Visual changes/Diplopia:No Ringing in ears:None Fatigue:Having fatigue all of the time. Cognitive changes:Pt alert & oriented x 3 with fluent speech,denies any memory problems with short or long term memory. Weight: Wt Readings from Last 3 Encounters:  08/25/16 187 lb 3.2 oz (84.9 kg)  06/10/16 188 lb 3.2 oz (85.4 kg)  06/07/16 186 lb 8 oz (84.6 kg)  BP 134/73   Pulse 89   Temp 97.9 F (36.6 C) (Oral)   Resp 18   Ht 5\' 11"  (1.803 m)   Wt 187 lb 3.2 oz (84.9 kg)   SpO2 100%   BMI 26.11 kg/m

## 2016-08-24 NOTE — Progress Notes (Addendum)
Radiation Oncology         [phone]  Multidisciplinary Brain and Spine Oncology Clinic Follow-Up Visit Note  CC: Velna Ochs, MD  Ashok Pall, MD    ICD-9-CM ICD-10-CM   1. Astrocytoma of frontal lobe (HCC) 191.1 C71.1 oxyCODONE-acetaminophen (PERCOCET/ROXICET) 5-325 MG tablet  2. Dehiscence of closure of skull or craniotomy, subsequent encounter V58.89 T81.32XD oxyCODONE-acetaminophen (PERCOCET/ROXICET) 5-325 MG tablet   998.31    3. Chronic postoperative pain 338.28 G89.28 oxyCODONE-acetaminophen (PERCOCET/ROXICET) 5-325 MG tablet        Diagnosis:   51 y.o.  gentleman with a bifrontal (right greater than left) 8.7 cm grade II astrocytoma   Interval Since Last Radiation: 3 years, two months  06/18/2013-09/03/2013 conventional radiotherapy in 33 fractions to 59.4 Gy  Narrative:  Mr. Coye is a pleasant 51 y.o. gentleman who was treated for a primary brain tumor with surgical resection followed by conventional radiotherapy for his grade 2 astrocytoma, with concurrent Temodar. He has chronic pain from a surgical dehiscence from his craniotomy and night time headaches.  He has been followed closely in the multidisciplinary clinic, and has been referred to Dr. [name] in the summer due to an interval MRI scan changes, concerning for progression in the summer of 2017. He met with her and she did not recommend treatment at that time. He has been followed with punctate foci along the previous resection and treatment site which was more prominent in February 2018, however has improved on his scan from 08/18/16 which shows improvement in this questionable enhancement, however during conference the source of the enhancement was not felt to be consistent with his malignancy, and his pre-treatment disease was non enhancing. He comes to review these results today.  On review of systems, the patient reports that he is doing well overall. He continues to have daily headaches and takes  Percocet for this. He also uses ibuprofen as well which helps together with the narcotics. He continues to be reliable in the timing of requesting new prescriptions. He denies any chest pain, shortness of breath, cough, fevers, chills, night sweats, unintended weight changes. He denies any bowel or bladder disturbances, and denies abdominal pain, nausea or vomiting. He denies any new musculoskeletal or joint aches or pains, new skin lesions or concerns. A complete review of systems is obtained and is otherwise negative.  Past Medical History:  Past Medical History:  Diagnosis Date  . Astrocytoma of frontal lobe ([place]) 05/31/2013  . DM (diabetes mellitus) ([place]) 08/09/2011   A1c at diagnosis = 12.4  . Family history of breast cancer   . GERD (gastroesophageal reflux disease) 08/09/2011  . Seizure ([place]) 05/19/2013  . Shortness of breath     Past Surgical History: Past Surgical History:  Procedure Laterality Date  . APPENDECTOMY    . BRAIN BIOPSY N/A 05/24/2013   Procedure: Craniotomy for open biopsy;  Surgeon: Winfield Cunas, MD;  Location: [facility];  Service: Neurosurgery;  Laterality: N/A;  Craniotomy for open biopsy    Social History:  Social History   Social History  . Marital status: Significant Other    Spouse name: N/A  . Number of children: 2  . Years of education: N/A   Occupational History  .  [employer]   Social History Main Topics  . Smoking status: Current Every Day Smoker    Packs/day: 1.00    Years: 33.00    Types: Cigarettes  . Smokeless tobacco: Never Used     Comment:  Trying to quit. Down to 1 ppd, 10/11/13 down to 1/2 ppd  . Alcohol use 0.0 oz/week     Comment: social  . Drug use: Yes    Types: Marijuana     Comment: Marijuana daily  . Sexual activity: Not on file   Other Topics Concern  . Not on file   Social History Narrative   [place] the [place]. He does not work now.     Family History: Family History  Problem  Relation Age of Onset  . Breast cancer Mother     dx in her 28s-60s  . Bone cancer Mother   . Diabetes Father   . Heart disease Father   . Diabetes Brother   . Diabetes Paternal Uncle     ALLERGIES:  has No Known Allergies.  Meds: Current Outpatient Prescriptions  Medication Sig Dispense Refill  . metFORMIN (GLUCOPHAGE) 1000 MG tablet Take 1 pill in the morning and 1/2 pill in the evening. 90 tablet 12  . methocarbamol (ROBAXIN) 500 MG tablet Take 1 tablet (500 mg total) by mouth 2 (two) times daily. 20 tablet 0  . nicotine polacrilex (NICORETTE) 4 MG gum Take 1 each (4 mg total) by mouth as needed for smoking cessation. 100 tablet 0  . phenytoin (DILANTIN) 100 MG ER capsule Take 3 capsules every morning 90 capsule 11  . naproxen (NAPROSYN) 500 MG tablet Take 1 tablet (500 mg total) by mouth 2 (two) times daily. (Patient not taking: Reported on 08/25/2016) 30 tablet 0  . oxyCODONE-acetaminophen (PERCOCET/ROXICET) 5-325 MG tablet Take 1-2 tablets by mouth every 6 (six) hours as needed for severe pain. 90 tablet 0   No current facility-administered medications for this encounter.     Physical Findings: Wt Readings from Last 3 Encounters:  08/25/16 187 lb 3.2 oz (84.9 kg)  06/10/16 188 lb 3.2 oz (85.4 kg)  06/07/16 186 lb 8 oz (84.6 kg)   Temp Readings from Last 3 Encounters:  08/25/16 97.9 F (36.6 C) (Oral)  06/10/16 97.7 F (36.5 C) (Oral)  06/07/16 97.7 F (36.5 C) (Oral)   BP Readings from Last 3 Encounters:  08/25/16 134/73  08/11/16 126/90  06/10/16 140/88   Pulse Readings from Last 3 Encounters:  08/25/16 89  08/11/16 98  06/10/16 91  In general this is a well appearing caucasian male in no acute distress. He's alert and oriented x4 and appropriate throughout the examination. Cardiopulmonary assessment is negative for acute distress and he exhibits normal effort. No focal neurologic abnormalities are noted.  Lab Findings: Lab Results  Component Value Date    WBC 9.2 11/16/2013   HGB 16.1 11/16/2013   HCT 46.8 11/16/2013   MCV 91.1 11/16/2013   PLT 279 11/16/2013   Radiographic Findings: Ct Thoracic Spine Wo Contrast  Result Date: 08/11/2016 CLINICAL DATA:  Low back pain. History of cancer. Patient felt like he pulled a muscle on the toilet yesterday. Initial encounter. History of astrocytoma diagnosed in 2015. Chemotherapy and radiotherapy EXAM: CT THORACIC AND LUMBAR SPINE WITHOUT CONTRAST TECHNIQUE: Multidetector CT imaging of the thoracic and lumbar spine was performed without contrast. Multiplanar CT image reconstructions were also generated. COMPARISON:  Chest CT 04/12/2016.  Lumbar spine CT 09/21/2005 FINDINGS: CT THORACIC SPINE FINDINGS Alignment: Exaggerated kyphosis. Vertebrae: Negative for fracture. No evidence of aggressive bone lesion. Paraspinal and other soft tissues: No acute finding. Left coronary atherosclerotic calcification, age unexpected. Disc levels: Mild endplate spurring. No visible herniation or impingement. CT LUMBAR SPINE  FINDINGS Segmentation: 5 lumbar type vertebrae. Alignment: Normal. Vertebrae: No acute fracture or focal pathologic process. Paraspinal and other soft tissues: Aortic atherosclerosis. Disc levels: Mild disc bulging at L3-4 and below. No evidence of impingement. IMPRESSION: CT THORACIC SPINE IMPRESSION 1. No acute finding or evidence of impingement. 2. Coronary atherosclerosis, unexpected for age. CT LUMBAR SPINE IMPRESSION 1. No acute finding or evidence of impingement. 2. Mild disc bulging at L3-4 and below. Electronically Signed   By: Monte Fantasia M.D.   On: 08/11/2016 15:11   Ct Lumbar Spine Wo Contrast  Result Date: 08/11/2016 CLINICAL DATA:  Low back pain. History of cancer. Patient felt like he pulled a muscle on the toilet yesterday. Initial encounter. History of astrocytoma diagnosed in 2015. Chemotherapy and radiotherapy EXAM: CT THORACIC AND LUMBAR SPINE WITHOUT CONTRAST TECHNIQUE: Multidetector CT  imaging of the thoracic and lumbar spine was performed without contrast. Multiplanar CT image reconstructions were also generated. COMPARISON:  Chest CT 04/12/2016.  Lumbar spine CT 09/21/2005 FINDINGS: CT THORACIC SPINE FINDINGS Alignment: Exaggerated kyphosis. Vertebrae: Negative for fracture. No evidence of aggressive bone lesion. Paraspinal and other soft tissues: No acute finding. Left coronary atherosclerotic calcification, age unexpected. Disc levels: Mild endplate spurring. No visible herniation or impingement. CT LUMBAR SPINE FINDINGS Segmentation: 5 lumbar type vertebrae. Alignment: Normal. Vertebrae: No acute fracture or focal pathologic process. Paraspinal and other soft tissues: Aortic atherosclerosis. Disc levels: Mild disc bulging at L3-4 and below. No evidence of impingement. IMPRESSION: CT THORACIC SPINE IMPRESSION 1. No acute finding or evidence of impingement. 2. Coronary atherosclerosis, unexpected for age. CT LUMBAR SPINE IMPRESSION 1. No acute finding or evidence of impingement. 2. Mild disc bulging at L3-4 and below. Electronically Signed   By: Monte Fantasia M.D.   On: 08/11/2016 15:11   [name] BT Contrast  Result Date: 08/18/2016 CLINICAL DATA:  Astrocytoma followup. Biopsy, chemotherapy, and radiation treatment. Creatinine was obtained on site at [facility] at [address] Results: Creatinine 0.6 mg/dL. EXAM: MRI HEAD WITHOUT AND WITH CONTRAST TECHNIQUE: Multiplanar, multiecho pulse sequences of the brain and surrounding structures were obtained without and with intravenous contrast. CONTRAST:  80m MULTIHANCE GADOBENATE DIMEGLUMINE 529 MG/ML IV SOLN COMPARISON:  MRI head 06/04/2016, 02/27/2016 FINDINGS: Brain: Right frontal craniotomy for tumor biopsy. T2 hyperintensity in the high right frontal lobe is unchanged from prior studies. Surgical resection site unchanged. Small area of enhancement in the right frontal lobe shows mild interval improvement since the prior  study. Small area of enhancement in the right corona radiata slightly improved. 3 mm enhancement in the body the corpus callosum also improved in the interval. No new areas of enhancement. Ventricle size normal. Negative for hydrocephalus or shift of the midline structures. Negative for acute infarct. Vascular: Normal arterial flow voids. Skull and upper cervical spine: No acute skull abnormality. Sinuses/Orbits: Mild mucosal edema in the paranasal sinuses. Normal orbit. Other: None IMPRESSION: Interval improvement small areas of enhancement in the right frontal lobe, corpus callosum, and right centrum semiovale. These may be due to treated tumor or treatment related enhancement. No new areas of enhancement. T2 hyperintensity right frontal lobe unchanged. Electronically Signed   By: CFranchot GalloM.D.   On: 08/18/2016 09:55     Impression/Plan: 1. 51y.o. gentlelman with history of WHO grade II astrocytoma. The patient continues to be stable radiographically, and his films were reviewed this morning in brain conference. No evidence of tumor-like enhancement or nonenhancing growth has been identified however questions enhancement  noted on the report from radiology has been felt to be treatment-related. It has actually improved since his last scan and we will follow this expectantly. Recommendations for him to return in 3 months time for a repeat MRI of the brain, and for him to meet with our neuro oncologist to also be participating in his care, July 2018. 2. Chronic headaches and skull pain. The patient continues to be managed well with ibuprofen and Percocet. He remains reliable in the timing of requesting these indications prescriptions, and continues to be stable with this current regimen. We will continue this, and discussed this management with Dr. vascular flow is well diffuse comfortable continuing this.     Carola Rhine, PAC

## 2016-08-25 ENCOUNTER — Ambulatory Visit
Admission: RE | Admit: 2016-08-25 | Discharge: 2016-08-25 | Disposition: A | Discharge status: Home / Self Care | Payer: Medicare Other | Source: Ambulatory Visit | Attending: Radiation Oncology | Admitting: Radiation Oncology

## 2016-08-25 ENCOUNTER — Encounter: Payer: Self-pay | Admitting: Radiation Oncology

## 2016-08-25 DIAGNOSIS — G8928 Other chronic postprocedural pain: Secondary | ICD-10-CM

## 2016-08-25 DIAGNOSIS — C711 Malignant neoplasm of frontal lobe: Secondary | ICD-10-CM

## 2016-08-25 DIAGNOSIS — Z08 Encounter for follow-up examination after completed treatment for malignant neoplasm: Secondary | ICD-10-CM | POA: Diagnosis not present

## 2016-08-25 DIAGNOSIS — T8132XD Disruption of internal operation (surgical) wound, not elsewhere classified, subsequent encounter: Secondary | ICD-10-CM

## 2016-08-25 DIAGNOSIS — R51 Headache: Secondary | ICD-10-CM | POA: Diagnosis not present

## 2016-08-25 DIAGNOSIS — Z79891 Long term (current) use of opiate analgesic: Secondary | ICD-10-CM | POA: Diagnosis not present

## 2016-08-25 MED ORDER — OXYCODONE-ACETAMINOPHEN 5-325 MG PO TABS: 1.0000 | ORAL_TABLET | Freq: Four times a day (QID) | ORAL | 0 refills | Status: DC | PRN

## 2016-08-25 NOTE — Addendum Note (Signed)
Encounter addended by: Malena Edman, RN on: 08/25/2016  9:47 AM<BR>    Actions taken: Charge Capture section accepted

## 2016-09-01 NOTE — Addendum Note (Signed)
Encounter addended by: Hayden Pedro, PA-C on: 09/01/2016 12:19 PM<BR>    Actions taken: Sign clinical note

## 2016-09-02 MED FILL — OXYCODONE-ACETAMINOPHEN 5-3: 5-325 | 11 days supply | Qty: 90 | Fill #0

## 2016-09-07 ENCOUNTER — Other Ambulatory Visit: Payer: Self-pay

## 2016-09-07 DIAGNOSIS — E119 Type 2 diabetes mellitus without complications: Secondary | ICD-10-CM

## 2016-09-07 NOTE — Telephone Encounter (Signed)
metFORMIN (GLUCOPHAGE) 1000 MG tablet, refill request @ [facility].

## 2016-09-08 MED ORDER — METFORMIN HCL 1000 MG PO TABS: ORAL_TABLET | ORAL | 0 refills | Status: DC

## 2016-09-08 MED FILL — metFORMIN HCL 1000 MG TABS: 1000 | 90 days supply | Qty: 135 | Fill #0

## 2016-09-13 MED FILL — PHENYTOIN SOD EXT 100 MG CA: 100 | 30 days supply | Qty: 90 | Fill #10

## 2016-10-05 ENCOUNTER — Telehealth: Payer: Self-pay | Admitting: Radiation Oncology

## 2016-10-05 ENCOUNTER — Other Ambulatory Visit: Payer: Self-pay | Admitting: Radiation Oncology

## 2016-10-05 DIAGNOSIS — C711 Malignant neoplasm of frontal lobe: Secondary | ICD-10-CM

## 2016-10-05 DIAGNOSIS — G8928 Other chronic postprocedural pain: Secondary | ICD-10-CM

## 2016-10-05 DIAGNOSIS — T8132XD Disruption of internal operation (surgical) wound, not elsewhere classified, subsequent encounter: Secondary | ICD-10-CM

## 2016-10-05 MED ORDER — OXYCODONE-ACETAMINOPHEN 5-325 MG PO TABS: 1.0000 | ORAL_TABLET | Freq: Four times a day (QID) | ORAL | 0 refills | Status: DC | PRN

## 2016-10-05 MED FILL — OXYCODONE-ACETAMINOPHEN 5-3: 5-325 | 12 days supply | Qty: 90 | Fill #0

## 2016-10-05 NOTE — Telephone Encounter (Signed)
Left message explaining that he has a prescription ready for pick up in the radiation oncology nursing area.

## 2016-10-19 ENCOUNTER — Other Ambulatory Visit: Payer: Self-pay | Admitting: Radiation Oncology

## 2016-10-19 NOTE — Telephone Encounter (Signed)
forwared to [facility]

## 2016-10-25 ENCOUNTER — Telehealth: Payer: Self-pay | Admitting: Radiation Oncology

## 2016-10-25 NOTE — Telephone Encounter (Signed)
Received refill request for patient's Dilantin. Previously Dilantin was filled by Dr. Delice Lesch. Noted patient was seen last by Dr. Delice Lesch September 2017. Phoned patient to inquire. Patient reports he phone the pharmacy since they haven't called him to pick up his refill and they told him they had sent the request to Dr. Tammi Klippel. Patient states, "I told them they should have sent the request to Dr. Delice Lesch; I even gave them the fax number." Patient goes onto say that the pharmacy told him to call back at 3 pm today and they would fax the request to Dr. Delice Lesch. Patient reports he has been without Dilantin for seven days. Patient understands to call this RN back with future needs.

## 2016-10-26 ENCOUNTER — Telehealth: Payer: Self-pay | Admitting: Neurology

## 2016-10-26 ENCOUNTER — Other Ambulatory Visit: Payer: Self-pay

## 2016-10-26 MED ORDER — PHENYTOIN SODIUM EXTENDED 100 MG PO CAPS: ORAL_CAPSULE | ORAL | 1 refills | Status: DC

## 2016-10-26 MED FILL — PHENYTOIN SOD EXT 100 MG CA: 100 | 30 days supply | Qty: 90 | Fill #0

## 2016-10-26 NOTE — Telephone Encounter (Signed)
Rx sent.  Spoke with [facility] outpatient pharmacy, they state that the original Rx was sent in May of 2017 and he has now run out of refills.  They never received anything from Sept 2017 as he already had open refills available.  Asked the pharmacist if they could let the pt know that he needs to at least have a follow up appointment scheduled before we will send more refills.

## 2016-10-26 NOTE — Telephone Encounter (Signed)
Doctor Katharina Caper office has called regarding the seizure medication that he has been without. Please call at [phone]. Thanks

## 2016-10-26 NOTE — Telephone Encounter (Signed)
PT called and said he has been without his seizure medication for 8 days and he has said the pharmacy has requested via fax 3 times and has not heard back from anyone

## 2016-10-27 NOTE — Telephone Encounter (Signed)
Rx sent to pt listed preferred pharmacy yesterday.  Note to pharmacy - pt needs to at least schedule follow up appointment for any further refills.

## 2016-10-29 ENCOUNTER — Other Ambulatory Visit: Payer: Self-pay | Admitting: Radiation Oncology

## 2016-10-29 ENCOUNTER — Telehealth: Payer: Self-pay | Admitting: *Deleted

## 2016-10-29 DIAGNOSIS — T8132XD Disruption of internal operation (surgical) wound, not elsewhere classified, subsequent encounter: Secondary | ICD-10-CM

## 2016-10-29 DIAGNOSIS — G8928 Other chronic postprocedural pain: Secondary | ICD-10-CM

## 2016-10-29 DIAGNOSIS — C711 Malignant neoplasm of frontal lobe: Secondary | ICD-10-CM

## 2016-10-29 MED ORDER — OXYCODONE-ACETAMINOPHEN 5-325 MG PO TABS: 1.0000 | ORAL_TABLET | Freq: Four times a day (QID) | ORAL | 0 refills | Status: DC | PRN

## 2016-10-29 MED FILL — OXYCODONE-ACETAMINOPHEN 5-3: 5-325 | 12 days supply | Qty: 90 | Fill #0

## 2016-10-29 NOTE — Telephone Encounter (Signed)
CALLED PATIENT TO PICK-UP SCRIPT, SPOKE WITH PATIENT AND HE IS AWARE OF THIS

## 2016-11-08 ENCOUNTER — Encounter: Payer: Self-pay | Admitting: Internal Medicine

## 2016-11-24 ENCOUNTER — Telehealth: Payer: Self-pay | Admitting: Neurology

## 2016-11-24 MED FILL — PHENYTOIN SOD EXT 100 MG CA: 100 | 30 days supply | Qty: 90 | Fill #1

## 2016-11-24 NOTE — Telephone Encounter (Signed)
Patient just made appt to see dr Delice Lesch in nov for a medication check and he will be out of refills on 12-26-16 and wanted to know what to do please call

## 2016-11-25 ENCOUNTER — Ambulatory Visit: Payer: Self-pay | Admitting: Neurology

## 2016-11-25 ENCOUNTER — Other Ambulatory Visit: Payer: Self-pay

## 2016-11-25 MED ORDER — PHENYTOIN SODIUM EXTENDED 100 MG PO CAPS: ORAL_CAPSULE | ORAL | 3 refills | Status: DC

## 2016-11-25 NOTE — Telephone Encounter (Signed)
Rx sent to [facility], with 3 refills, should be enough to last until his visit in November.

## 2016-11-29 ENCOUNTER — Ambulatory Visit (INDEPENDENT_AMBULATORY_CARE_PROVIDER_SITE_OTHER): Payer: Medicare Other | Admitting: Internal Medicine

## 2016-11-29 ENCOUNTER — Other Ambulatory Visit: Payer: Self-pay | Admitting: Radiation Oncology

## 2016-11-29 ENCOUNTER — Encounter: Payer: Self-pay | Admitting: Internal Medicine

## 2016-11-29 VITALS — BP 134/77 | HR 82 | Temp 97.5°F | Ht 73.0 in | Wt 181.7 lb

## 2016-11-29 DIAGNOSIS — Z7984 Long term (current) use of oral hypoglycemic drugs: Secondary | ICD-10-CM | POA: Diagnosis not present

## 2016-11-29 DIAGNOSIS — T8132XD Disruption of internal operation (surgical) wound, not elsewhere classified, subsequent encounter: Secondary | ICD-10-CM

## 2016-11-29 DIAGNOSIS — C711 Malignant neoplasm of frontal lobe: Secondary | ICD-10-CM

## 2016-11-29 DIAGNOSIS — F17210 Nicotine dependence, cigarettes, uncomplicated: Secondary | ICD-10-CM

## 2016-11-29 DIAGNOSIS — E119 Type 2 diabetes mellitus without complications: Secondary | ICD-10-CM

## 2016-11-29 DIAGNOSIS — Z803 Family history of malignant neoplasm of breast: Secondary | ICD-10-CM | POA: Diagnosis not present

## 2016-11-29 DIAGNOSIS — Z808 Family history of malignant neoplasm of other organs or systems: Secondary | ICD-10-CM | POA: Diagnosis not present

## 2016-11-29 DIAGNOSIS — Z833 Family history of diabetes mellitus: Secondary | ICD-10-CM

## 2016-11-29 DIAGNOSIS — Z79899 Other long term (current) drug therapy: Secondary | ICD-10-CM

## 2016-11-29 DIAGNOSIS — G8928 Other chronic postprocedural pain: Secondary | ICD-10-CM

## 2016-11-29 LAB — POCT GLYCOSYLATED HEMOGLOBIN (HGB A1C): Hemoglobin A1C: 6.9

## 2016-11-29 LAB — GLUCOSE, CAPILLARY: Glucose-Capillary: 203 mg/dL — ABNORMAL HIGH (ref 65–99)

## 2016-11-29 MED ORDER — OXYCODONE-ACETAMINOPHEN 5-325 MG PO TABS: 1.0000 | ORAL_TABLET | Freq: Four times a day (QID) | ORAL | 0 refills | Status: DC | PRN

## 2016-11-29 MED ORDER — METFORMIN HCL 1000 MG PO TABS: 1000.0000 mg | ORAL_TABLET | Freq: Every day | ORAL | 0 refills | Status: DC

## 2016-11-29 NOTE — Patient Instructions (Signed)
Mr. Dogan,  It was a pleasure to see you today. I will call you with the results of your A1c today. Please continue to take all of your medications as previously prescribed. You may follow up with me in 6 months or sooner if you need me. If you have any questions or concerns, call our clinic at [phone] or after hours call [phone] and ask for the internal medicine resident on call. Thank you!  - Dr. Philipp Ovens

## 2016-11-29 NOTE — Progress Notes (Addendum)
   CC: DM follow up  HPI:  Mr.Ranald A Mcknight is a 51 y.o. male with past medical history outlined below here for DM follow up. For the details of today's visit, please refer to the assessment and plan.  Past Medical History:  Diagnosis Date  . Astrocytoma of frontal lobe ([place]) 05/31/2013  . DM (diabetes mellitus) ([place]) 08/09/2011   A1c at diagnosis = 12.4  . Family history of breast cancer   . GERD (gastroesophageal reflux disease) 08/09/2011  . Seizure ([place]) 05/19/2013  . Shortness of breath     Review of Systems:  Denies any recent seizure activity.   Physical Exam:  There were no vitals filed for this visit.  Constitutional: NAD, appears comfortable HEENT: Right frontal scalp with well healed scar Cardiovascular: RRR Pulmonary/Chest: CTAB Extremities: Warm and well perfused. Distal pulses intact. No edema.  Neurological: A&Ox3, CN II - XII grossly intact. No focal deficits Psychiatric: Normal mood and affect  Assessment & Plan:   See Encounters Tab for problem based charting.  Patient discussed with Dr. Lynnae January

## 2016-11-29 NOTE — Assessment & Plan Note (Addendum)
Patient has a history of diabetes type 2 diagnosed in 2013 that has been well-controlled on metformin 1000 mg daily. A1c today is 6.9. Foot exam is unremarkable, distal pulses intact.  -- Continue current management  -- Follow up 6 months

## 2016-11-29 NOTE — Assessment & Plan Note (Addendum)
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 He follows with Dr. Tammi Klippel at [facility] and is due for follow-up this month. He does not currently have an appointment scheduled, but reports he is planning to go by Dr. Johny Shears office to pick up a prescription later today and will schedule then. Last MRI in April 2018 appeared to have some interval improvement compared to prior imaging with no new evidence of progression. He continues to have daily headaches which are controlled with prn Percocet and ibuprofen, but no other neurological deficits. He denies any recent seizure activity. He has been well controlled on Dilantin 100 mg ER daily. Patient recently ran out of his prescription and had difficulty obtaining refills. He is currently scheduled to follow-up with neurology in November 2018. I informed patient that our office is happy to refill and/or manage his Dilantin if necessary. He would need regular phenytoin concentration levels checked, CBC and liver function monitoring. He has declined lab work today, but will call back if he needs refills. -- Follow up oncology, neurology

## 2016-11-30 ENCOUNTER — Telehealth: Payer: Self-pay | Admitting: Radiation Oncology

## 2016-11-30 NOTE — Progress Notes (Signed)
Internal Medicine Clinic Attending  Case discussed with Dr. Guilloud at the time of the visit.  We reviewed the resident's history and exam and pertinent patient test results.  I agree with the assessment, diagnosis, and plan of care documented in the resident's note.  

## 2016-11-30 NOTE — Telephone Encounter (Signed)
Left message that patient's pain script is ready for pick up in the radiation oncology nursing area.

## 2016-12-01 MED FILL — OXYCODONE-ACETAMINOPHEN 5-3: 5-325 | 11 days supply | Qty: 90 | Fill #0

## 2016-12-28 ENCOUNTER — Telehealth: Payer: Self-pay | Admitting: Internal Medicine

## 2016-12-28 MED FILL — PHENYTOIN SOD EXT 100 MG CA: 100 | 30 days supply | Qty: 90 | Fill #0

## 2016-12-28 NOTE — Telephone Encounter (Signed)
Refill Request   phenytoin (DILANTIN) 100 MG ER capsule

## 2016-12-28 NOTE — Telephone Encounter (Signed)
Dilantin was refilled in July x 3 refills - called pt no answer. Left message to call [facility] pharmacy.

## 2017-01-04 ENCOUNTER — Telehealth: Payer: Self-pay | Admitting: Radiation Oncology

## 2017-01-04 ENCOUNTER — Other Ambulatory Visit: Payer: Self-pay | Admitting: Radiation Oncology

## 2017-01-04 DIAGNOSIS — T8132XD Disruption of internal operation (surgical) wound, not elsewhere classified, subsequent encounter: Secondary | ICD-10-CM

## 2017-01-04 DIAGNOSIS — G8928 Other chronic postprocedural pain: Secondary | ICD-10-CM

## 2017-01-04 DIAGNOSIS — C711 Malignant neoplasm of frontal lobe: Secondary | ICD-10-CM

## 2017-01-04 MED ORDER — OXYCODONE-ACETAMINOPHEN 5-325 MG PO TABS: 1.0000 | ORAL_TABLET | Freq: Four times a day (QID) | ORAL | 0 refills | Status: DC | PRN

## 2017-01-04 MED FILL — OXYCODONE-ACETAMINOPHEN 5-3: 5-325 | 11 days supply | Qty: 90 | Fill #0

## 2017-01-04 NOTE — Telephone Encounter (Signed)
Received voicemail from patient requesting percocet refill. Phoned patient making him aware script is ready for pick up. Patient verbalized understanding.

## 2017-01-24 ENCOUNTER — Telehealth: Payer: Self-pay | Admitting: Radiation Oncology

## 2017-01-24 NOTE — Telephone Encounter (Signed)
At the request of the patient's daughter, Jesten Cappuccio, I fax a letter to the light company detailing her fathers condition and need for electricity. Fax confirmation the letter was received at [phone] obtained. Provided copy of letter to front desk staff to scan into the EMR.

## 2017-01-31 MED FILL — PHENYTOIN SOD EXT 100 MG CA: 100 | 30 days supply | Qty: 90 | Fill #1

## 2017-02-01 ENCOUNTER — Other Ambulatory Visit: Payer: Self-pay | Admitting: Radiation Oncology

## 2017-02-01 DIAGNOSIS — G8928 Other chronic postprocedural pain: Secondary | ICD-10-CM

## 2017-02-01 DIAGNOSIS — T8132XD Disruption of internal operation (surgical) wound, not elsewhere classified, subsequent encounter: Secondary | ICD-10-CM

## 2017-02-01 DIAGNOSIS — C711 Malignant neoplasm of frontal lobe: Secondary | ICD-10-CM

## 2017-02-01 MED ORDER — OXYCODONE-ACETAMINOPHEN 5-325 MG PO TABS: 1.0000 | ORAL_TABLET | Freq: Four times a day (QID) | ORAL | 0 refills | Status: DC | PRN

## 2017-02-03 MED FILL — OXYCOD/ACETAMINOPHEN 5-325M: 5-325 | 11 days supply | Qty: 90 | Fill #0

## 2017-03-02 ENCOUNTER — Other Ambulatory Visit: Payer: Self-pay | Admitting: Radiation Oncology

## 2017-03-02 ENCOUNTER — Telehealth: Payer: Self-pay | Admitting: *Deleted

## 2017-03-02 DIAGNOSIS — G8928 Other chronic postprocedural pain: Secondary | ICD-10-CM

## 2017-03-02 DIAGNOSIS — T8132XD Disruption of internal operation (surgical) wound, not elsewhere classified, subsequent encounter: Secondary | ICD-10-CM

## 2017-03-02 DIAGNOSIS — C711 Malignant neoplasm of frontal lobe: Secondary | ICD-10-CM

## 2017-03-02 MED ORDER — OXYCODONE-ACETAMINOPHEN 5-325 MG PO TABS: 1.0000 | ORAL_TABLET | Freq: Four times a day (QID) | ORAL | 0 refills | Status: DC | PRN

## 2017-03-02 MED FILL — PHENYTOIN SOD EXT 100 MG CA: 100 | 30 days supply | Qty: 90 | Fill #2

## 2017-03-02 NOTE — Telephone Encounter (Signed)
Called patient to inform that script is ready for pick-up, spoke with daughter, Overton Mam and she is aware that the script is ready

## 2017-03-03 MED FILL — OXYCOD/ACETAMINOPHEN 5-325M: 5-325 | 11 days supply | Qty: 90 | Fill #0

## 2017-03-14 ENCOUNTER — Other Ambulatory Visit: Payer: Self-pay | Admitting: Radiation Therapy

## 2017-03-14 DIAGNOSIS — C719 Malignant neoplasm of brain, unspecified: Secondary | ICD-10-CM

## 2017-03-21 ENCOUNTER — Ambulatory Visit (INDEPENDENT_AMBULATORY_CARE_PROVIDER_SITE_OTHER): Payer: Medicare Other | Admitting: Neurology

## 2017-03-21 ENCOUNTER — Encounter: Payer: Self-pay | Admitting: Neurology

## 2017-03-21 VITALS — BP 128/76 | HR 89 | Ht 72.0 in | Wt 184.0 lb

## 2017-03-21 DIAGNOSIS — C719 Malignant neoplasm of brain, unspecified: Secondary | ICD-10-CM | POA: Diagnosis not present

## 2017-03-21 DIAGNOSIS — G40109 Localization-related (focal) (partial) symptomatic epilepsy and epileptic syndromes with simple partial seizures, not intractable, without status epilepticus: Secondary | ICD-10-CM | POA: Diagnosis not present

## 2017-03-21 MED ORDER — PHENYTOIN SODIUM EXTENDED 100 MG PO CAPS: ORAL_CAPSULE | ORAL | 3 refills | Status: DC

## 2017-03-21 NOTE — Progress Notes (Signed)
NEUROLOGY FOLLOW UP OFFICE NOTE  Jonathan Barker [id number]  HISTORY OF PRESENT ILLNESS: I had the pleasure of seeing Jonathan Barker in follow-up in the neurology clinic on 03/21/2017. He is again accompanied by his daughter who helps supplement the history today. The patient was last seen more than a year ago for seizures secondary to bifrontal grade II astrocytoma. He and his daughter deny any seizures since January 2015. His Dilantin level was low, but he reported taking only 200mg /day. Since his last visit over a year ago, he has been taking 300mg  every morning without any seizures or seizure-like symptoms, no side effects. Repeat Dilantin level on this dose was again low (<2.5), however since he has been seizure-free, we had agreed to stay on the same dose and follow him clinically. He ran out of Dilantin for 7 days without any breakthrough seizures. He denies any further eye blinking episodes, generalized shaking/stiffening,olfactory/gustatory hallucinations, deja vu, rising epigastric sensation, focal numbness/tingling/weakness, myoclonic jerks. He has been having frequent headaches since the brain biopsy, mostly at night, occurring around 2-3 times a week. He takes prn Percocet.   He had a repeat MRI brain with and without contrast last 08/18/16 which I personally reviewed, T2 hyperintensity in the high right frontal lobe is unchanged, small area of enhancement in right frontal lobe, right corona radiata, body of corpus callosum, showed mild interval improvement. No new areas of enhancement seen. He continues to have headaches 2-3 times at night and takes prn Percocet.   HPI 04/15/15: This is a pleasant 51 yo RH man who had a new onset seizure last 05/18/13 and was found to have an infiltrative mass in the anteromedial right frontal lobe without enhancement, with FLAIR signal extending across the corpus callosum into the contralateral subcortical left frontal lobe. His daughter reports that  he started blinking excessively, he recalls feeling his eyes "blinking at 100 mph," he tried to sit but could not because his left hand was locked inside his pocket. He was witnessed to have generalized shaking for less than a minute. He reports that he recalls the entire event, he could hear people screaming around him. His daughter reports that he was coherent right after the seizure, no post-ictal confusion or focal weakness. He was brought to [facility] where imaging revealed the bifrontal mass, biopsy demonstrated diffuse grade 2 astrocytoma. Wake and drowsy EEG was normal. He took Temodar, then underwent radiotherapy until May 2015. He denies any further seizures since January, but does feel that he may have had a seizure the week after hospital discharge in January 2015, he woke up with his arms flexed, unsure if he had dreamed he had a seizure, no associated tongue bite or incontinence. His wife had mentioned around that time that she felt the bed shaking one night. Since January 2015, he denies any further eye blinking episodes, generalized shaking or stiffening of extremities, olfactory/gustatory hallucinations, deja vu, rising epigastric sensation, focal numbness/tingling/weakness, myoclonic jerks. He has had low Dilantin in levels in the past, and reports that he had only been taking it twice a day instead of TID, but with most recent level checked in November 2016 (<2.5), he reports taking Dilantin 100mg  TID. He denies any side effects to the medication.  He denies any prior history of headaches until brain biopsy, and since then he has been having frequent headaches. Headaches are over the frontal region, with pressure sensation, no associated nausea, vomiting, photo/phonophobia. He reports headaches occur at the end of the day,  3-4 times a week, with good response to Percocet. He usually takes the medication and goes to bed, with no headache on awakening. He notices more headaches with cold weather. He  denies any dizziness, vision changes, dysarthria/dysphagia, neck/back pain, focal numbness/tingling/weakness, bowel/bladder dysfunction. He usually has 4 alcoholic drinks a month. He had a normal birth and early development. There is no history of febrile convulsions, CNS infections such as meningitis/encephalitis, significant traumatic brain injury, or family history of seizures.  I personally reviewed most recent MRI brain with and without contrast done 02/14/15 which showed abnormal FLAIR signal throughout the white matter of the frontal lobes, right more than left, unchanged, with small cystic area in the left frontal cortical/subcortical region that is noted to be slightly larger (64mm) compared to last year. There is a punctate focus of enhancement in the right frontal white matter, 24mm focus of enhancement in the right basal ganglia/external capsule that is new.   PAST MEDICAL HISTORY: Past Medical History:  Diagnosis Date  . Astrocytoma of frontal lobe ([place]) 05/31/2013  . DM (diabetes mellitus) ([place]) 08/09/2011   A1c at diagnosis = 12.4  . Family history of breast cancer   . GERD (gastroesophageal reflux disease) 08/09/2011  . Seizure ([place]) 05/19/2013  . Shortness of breath     MEDICATIONS:  Outpatient Encounter Medications as of 03/21/2017  Medication Sig  . metFORMIN (GLUCOPHAGE) 1000 MG tablet Take 1 tablet (1,000 mg total) by mouth daily. Take 1 pill in the morning and 1/2 pill in the evening.  . methocarbamol (ROBAXIN) 500 MG tablet Take 1 tablet (500 mg total) by mouth 2 (two) times daily.  . naproxen (NAPROSYN) 500 MG tablet Take 1 tablet (500 mg total) by mouth 2 (two) times daily.  . nicotine polacrilex (NICORETTE) 4 MG gum Take 1 each (4 mg total) by mouth as needed for smoking cessation.  Marland Kitchen oxyCODONE-acetaminophen (PERCOCET/ROXICET) 5-325 MG tablet Take 1-2 tablets by mouth every 6 (six) hours as needed for severe pain.  . phenytoin (DILANTIN) 100 MG ER capsule Take 3 capsules  every morning   No facility-administered encounter medications on file as of 03/21/2017.     ALLERGIES: No Known Allergies  FAMILY HISTORY: Family History  Problem Relation Age of Onset  . Breast cancer Mother        dx in her 4s-60s  . Bone cancer Mother   . Diabetes Father   . Heart disease Father   . Diabetes Brother   . Diabetes Paternal Uncle     SOCIAL HISTORY: Social History   Socioeconomic History  . Marital status: Significant Other    Spouse name: Not on file  . Number of children: 2  . Years of education: Not on file  . Highest education level: Not on file  Social Needs  . Financial resource strain: Not on file  . Food insecurity - worry: Not on file  . Food insecurity - inability: Not on file  . Transportation needs - medical: Not on file  . Transportation needs - non-medical: Not on file  Occupational History    Employer: [employer]  Tobacco Use  . Smoking status: Current Every Day Smoker    Packs/day: 1.00    Years: 33.00    Pack years: 33.00    Types: Cigarettes  . Smokeless tobacco: Never Used  . Tobacco comment: Trying to quit. Down to 1 ppd, 10/11/13 down to 1/2 ppd  Substance and Sexual Activity  . Alcohol use: Yes  Alcohol/week: 0.0 oz    Comment: social  . Drug use: Yes    Types: Marijuana    Comment: Marijuana daily  . Sexual activity: Not on file  Other Topics Concern  . Not on file  Social History Narrative   [place]. He does not work now.     REVIEW OF SYSTEMS: Constitutional: No fevers, chills, or sweats, no generalized fatigue, change in appetite Eyes: No visual changes, double vision, eye pain Ear, nose and throat: No hearing loss, ear pain, nasal congestion, sore throat Cardiovascular: No chest pain, palpitations Respiratory:  No shortness of breath at rest or with exertion, wheezes GastrointestinaI: No nausea, vomiting, diarrhea, abdominal pain, fecal incontinence Genitourinary:   No dysuria, urinary retention or frequency Musculoskeletal:  No neck pain, back pain Integumentary: No rash, pruritus, skin lesions Neurological: as above Psychiatric: No depression, insomnia, anxiety Endocrine: No palpitations, fatigue, diaphoresis, mood swings, change in appetite, change in weight, increased thirst Hematologic/Lymphatic:  No anemia, purpura, petechiae. Allergic/Immunologic: no itchy/runny eyes, nasal congestion, recent allergic reactions, rashes  PHYSICAL EXAM: Vitals:   03/21/17 1519  BP: 128/76  Pulse: 89  SpO2: 97%   General: No acute distress Head:  Normocephalic/atraumatic Neck: supple, no paraspinal tenderness, full range of motion Heart:  Regular rate and rhythm Lungs:  Clear to auscultation bilaterally Back: No paraspinal tenderness Skin/Extremities: No rash, no edema Neurological Exam: alert and oriented to person, place, and time. No aphasia or dysarthria. Fund of knowledge is appropriate.  Remote memory intact.  3/3 delayed recall. Attention and concentration are normal.    Able to name objects and repeat phrases. Cranial nerves: Pupils equal, round, reactive to light. Extraocular movements intact with no nystagmus. Visual fields full. Facial sensation intact. No facial asymmetry. Tongue, uvula, palate midline.  Motor: Bulk and tone normal, muscle strength 5/5 throughout with no pronator drift.  Sensation to light touch intact.  No extinction to double simultaneous stimulation.  Deep tendon reflexes 2+ throughout, toes downgoing.  Finger to nose testing intact.  Gait narrow-based and steady, able to tandem walk adequately.  Romberg negative.  IMPRESSION: This is a pleasant 51 yo RH man with a history of new onset seizure last January 2015 during which he was found to have a bifrontal (right greater than left) grade II astrocytoma. He underwent Temodar treatment and radiotherapy. Last MRI brain in April 2018 showed stable T2 hyperintensity in the high right  frontal lobe, small area of enhancement in right frontal lobe, right corona radiata, body of corpus callosum, showed mild interval improvement. No new areas of enhancement seen. He has another scan scheduled this month. He continues on Dilantin 300mg  daily, again with subtherapeutic level (<2.5), however no seizures since 2015. We had previously discussed the option of switching to a different medication that does not require blood level monitoring, he declined. . Since he has been seizure-free on low Dilantin levels (reports compliance to medication), we will continue to monitor clinically, they know to call if there are any changes. He is aware of [place] driving laws to stop driving after a seizure, until 6 months seizure-free. He will follow-up in 1 year and knows to call for any changes.  Thank you for allowing me to participate in his care.  Please do not hesitate to call for any questions or concerns.  The duration of this appointment visit was 15 minutes of face-to-face time with the patient.  Greater than 50% of this time was spent in counseling,  explanation of diagnosis, planning of further management, and coordination of care.   Ellouise Newer, M.D.   CC: Dr. Philipp Ovens, Shona Simpson, PA-C

## 2017-03-21 NOTE — Patient Instructions (Signed)
1. Continue Dilantin 100mg : take 3 capsules every morning 2. Follow-up as scheduled with your oncologist 3. Follow-up in 1 year, call for any changes  Seizure Precautions: 1. If medication has been prescribed for you to prevent seizures, take it exactly as directed.  Do not stop taking the medicine without talking to your doctor first, even if you have not had a seizure in a long time.   2. Avoid activities in which a seizure would cause danger to yourself or to others.  Don't operate dangerous machinery, swim alone, or climb in high or dangerous places, such as on ladders, roofs, or girders.  Do not drive unless your doctor says you may.  3. If you have any warning that you may have a seizure, lay down in a safe place where you can't hurt yourself.    4.  No driving for 6 months from last seizure, as per [facility].   Please refer to the following link on the [place] website for more information: http://[email or web address]   5.  Maintain good sleep hygiene. Avoid alcohol.  6.  Contact your doctor if you have any problems that may be related to the medicine you are taking.  7.  Call 911 and bring the patient back to the ED if:        A.  The seizure lasts longer than 5 minutes.       B.  The patient doesn't awaken shortly after the seizure  C.  The patient has new problems such as difficulty seeing, speaking or moving  D.  The patient was injured during the seizure  E.  The patient has a temperature over 102 F (39C)  F.  The patient vomited and now is having trouble breathing

## 2017-03-22 ENCOUNTER — Encounter: Payer: Self-pay | Admitting: Neurology

## 2017-03-25 MED FILL — PHENYTOIN SOD EXT 100 MG CA: 100 | 90 days supply | Qty: 270 | Fill #0

## 2017-03-29 ENCOUNTER — Ambulatory Visit
Admission: RE | Admit: 2017-03-29 | Discharge: 2017-03-29 | Disposition: A | Discharge status: Home / Self Care | Payer: Medicare Other | Source: Ambulatory Visit | Attending: Radiation Oncology | Admitting: Radiation Oncology

## 2017-03-29 DIAGNOSIS — C719 Malignant neoplasm of brain, unspecified: Secondary | ICD-10-CM | POA: Diagnosis not present

## 2017-03-29 MED ORDER — GADOBENATE DIMEGLUMINE 529 MG/ML IV SOLN
17.0000 mL | Freq: Once | INTRAVENOUS | Status: AC | PRN
  Administered 2017-03-29: 17 mL via INTRAVENOUS

## 2017-03-31 ENCOUNTER — Other Ambulatory Visit: Payer: Self-pay

## 2017-03-31 ENCOUNTER — Other Ambulatory Visit: Payer: Self-pay | Admitting: Radiation Oncology

## 2017-03-31 ENCOUNTER — Encounter: Payer: Self-pay | Admitting: Internal Medicine

## 2017-03-31 ENCOUNTER — Ambulatory Visit (HOSPITAL_BASED_OUTPATIENT_CLINIC_OR_DEPARTMENT_OTHER): Payer: Medicare Other | Admitting: Internal Medicine

## 2017-03-31 ENCOUNTER — Telehealth: Payer: Self-pay | Admitting: Internal Medicine

## 2017-03-31 VITALS — BP 140/89 | HR 86 | Temp 97.6°F | Resp 18 | Ht 72.0 in | Wt 185.1 lb

## 2017-03-31 DIAGNOSIS — C711 Malignant neoplasm of frontal lobe: Secondary | ICD-10-CM | POA: Diagnosis not present

## 2017-03-31 DIAGNOSIS — G8928 Other chronic postprocedural pain: Secondary | ICD-10-CM

## 2017-03-31 DIAGNOSIS — T8132XD Disruption of internal operation (surgical) wound, not elsewhere classified, subsequent encounter: Secondary | ICD-10-CM

## 2017-03-31 MED ORDER — OXYCODONE-ACETAMINOPHEN 5-325 MG PO TABS: 1.0000 | ORAL_TABLET | Freq: Four times a day (QID) | ORAL | 0 refills | Status: DC | PRN

## 2017-03-31 MED FILL — OXYCOD/ACETAMINOPHEN 5-325M: 5-325 | 12 days supply | Qty: 90 | Fill #0

## 2017-03-31 NOTE — Telephone Encounter (Signed)
Patient declined AVS and calendar. Patient scheduled per 11/29 los.

## 2017-03-31 NOTE — Progress Notes (Signed)
 [facility] at [employer] [address]  [place], [place] [address] [phone]   New Patient Evaluation  Date of Service: 03/31/17 Patient Name: Jonathan Barker Patient MRN: [id number] Patient DOB: 05/10/1965 Provider:  K , MD  Identifying Statement:  Jonathan Barker is a 51 y.o. 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    Referring Provider: Guilloud, Carolyn, MD [address] [place], [place] [address]  Oncologic History:   Astrocytoma of frontal lobe (HCC)   05/18/2013 Imaging    After experiencing focal seizure, MRI demonstrates non-enhancing mass in the right frontal lobe with callosal extension into left frontal white matter      05/24/2013 Surgery    Biopsy with Dr. Cabbell, pathology demonstrates WHO grade II diffuse astrocytoma      06/18/2013 - 09/03/2013 Radiation Therapy    54 Gy IMRT in 30 fx, concurrent Temodar, with Dr. Manning       Biomarkers:  MGMT Unknown.  IDH 1/2 Unknown.  EGFR Unknown  TERT Unknown   History of Present Illness: The patient's records from the referring physician were obtained and reviewed and the patient interviewed to confirm this HPI.  Phares A Falconi presented initially in early 2015 with a focal seizure, described as "left side clenching up", "change in awareness without loss in memory".  This event led to an MRI brain which demonstrated a likely mass centered in the right frontal lobe.  This underwent biopsy on 05/24/13, pathology revealed grade II glioma.  No genetic markers were sent at that time.  Following surgery he underwent radiation therapy as above, with concurrent Temodar.  Although he did consult with a neuro-oncologist in [place], no further therapy was administered, and he has been on observation for the past 3 years.  He return today with an MRI for evaluation.  He denies any recent seizures, does complain of 3-4  headaches per week as prior.    Medications: Current Outpatient Medications on File Prior to Visit  Medication Sig Dispense Refill  . metFORMIN (GLUCOPHAGE) 1000 MG tablet Take 1 tablet (1,000 mg total) by mouth daily. Take 1 pill in the morning and 1/2 pill in the evening. 90 tablet 0  . methocarbamol (ROBAXIN) 500 MG tablet Take 1 tablet (500 mg total) by mouth 2 (two) times daily. 20 tablet 0  . naproxen (NAPROSYN) 500 MG tablet Take 1 tablet (500 mg total) by mouth 2 (two) times daily. 30 tablet 0  . nicotine polacrilex (NICORETTE) 4 MG gum Take 1 each (4 mg total) by mouth as needed for smoking cessation. 100 tablet 0  . oxyCODONE-acetaminophen (PERCOCET/ROXICET) 5-325 MG tablet Take 1-2 tablets by mouth every 6 (six) hours as needed for severe pain. 90 tablet 0  . phenytoin (DILANTIN) 100 MG ER capsule Take 3 capsules every morning 270 capsule 3   No current facility-administered medications on file prior to visit.     Allergies: No Known Allergies Past Medical History:  Past Medical History:  Diagnosis Date  . Astrocytoma of frontal lobe (HCC) 05/31/2013  . DM (diabetes mellitus) (HCC) 08/09/2011   A1c at diagnosis = 12.4  . Family history of breast cancer   . GERD (gastroesophageal reflux disease) 08/09/2011  . Seizure (HCC) 05/19/2013  . Shortness of breath    Past Surgical History:  Past Surgical History:  Procedure Laterality Date  . APPENDECTOMY    . BRAIN BIOPSY N/A 05/24/2013     Procedure: Craniotomy for open biopsy;  Surgeon: Kyle L Cabbell, MD;  Location: [facility];  Service: Neurosurgery;  Laterality: N/A;  Craniotomy for open biopsy   Social History:  Social History   Socioeconomic History  . Marital status: Significant Other    Spouse name: Not on file  . Number of children: 2  . Years of education: Not on file  . Highest education level: Not on file  Social Needs  . Financial resource strain: Not on file  . Food insecurity - worry: Not on file  . Food  insecurity - inability: Not on file  . Transportation needs - medical: Not on file  . Transportation needs - non-medical: Not on file  Occupational History    Employer: [employer]  Tobacco Use  . Smoking status: Current Every Day Smoker    Packs/day: 1.00    Years: 33.00    Pack years: 33.00    Types: Cigarettes  . Smokeless tobacco: Never Used  . Tobacco comment: Trying to quit. Down to 1 ppd, 10/11/13 down to 1/2 ppd  Substance and Sexual Activity  . Alcohol use: Yes    Alcohol/week: 0.0 oz    Comment: social  . Drug use: Yes    Types: Marijuana    Comment: Marijuana daily  . Sexual activity: Not on file  Other Topics Concern  . Not on file  Social History Narrative   [facility] the [facility] [place]. He does not work now.    Family History:  Family History  Problem Relation Age of Onset  . Breast cancer Mother        dx in her 50s-60s  . Bone cancer Mother   . Diabetes Father   . Heart disease Father   . Diabetes Brother   . Diabetes Paternal Uncle     Review of Systems: Constitutional: Denies fevers, chills or abnormal weight loss Eyes: Denies blurriness of vision Ears, nose, mouth, throat, and face: Denies mucositis or sore throat Respiratory: Denies cough, dyspnea or wheezes Cardiovascular: Denies palpitation, chest discomfort or lower extremity swelling Gastrointestinal:  Denies nausea, constipation, diarrhea GU: Denies dysuria or incontinence Skin: Denies abnormal skin rashes Neurological: Per HPI Musculoskeletal: Denies joint pain, back or neck discomfort. No decrease in ROM Behavioral/Psych: Denies anxiety, disturbance in thought content, and mood instability  Physical Exam: Vitals:   03/31/17 1134  BP: 140/89  Pulse: 86  Resp: 18  Temp: 97.6 F (36.4 C)  SpO2: 100%   KPS: 90. General: Alert, cooperative, pleasant, in no acute distress Head: Normal EENT: No conjunctival injection or scleral icterus. Oral mucosa moist Lungs: Resp  effort normal Cardiac: Regular rate and rhythm Abdomen: Soft, non-distended abdomen Skin: No rashes cyanosis or petechiae. Extremities: No clubbing or edema  Neurologic Exam: Mental Status: Awake, alert, attentive to examiner. Oriented to self and environment. Language is fluent with intact comprehension.  Cranial Nerves: Visual acuity is grossly normal. Visual fields are full. Extra-ocular movements intact. No ptosis. Face is symmetric, tongue midline. Motor: Tone and bulk are normal. Power is full in both arms and legs. Reflexes are symmetric, no pathologic reflexes present. Intact finger to nose bilaterally Sensory: Intact to light touch and temperature Gait: Normal and tandem gait is normal.   Labs: I have reviewed the data as listed    Component Value Date/Time   NA 141 01/19/2016 1428   NA 139 01/16/2014 1035   K 4.3 01/19/2016 1428   K 4.1 01/16/2014 1035     CL 102 01/19/2016 1428   CO2 21 01/19/2016 1428   CO2 24 01/16/2014 1035   GLUCOSE 142 (H) 01/19/2016 1428   GLUCOSE 188 (H) 01/16/2014 1035   BUN 19 01/19/2016 1428   BUN 11.6 01/16/2014 1035   CREATININE 0.70 04/12/2016 1534   CREATININE 1.1 01/16/2014 1035   CALCIUM 9.6 01/19/2016 1428   CALCIUM 9.5 01/16/2014 1035   PROT 7.1 11/16/2013 0921   ALBUMIN 3.9 11/16/2013 0921   AST 23 11/16/2013 0921   ALT 35 11/16/2013 0921   ALKPHOS 83 11/16/2013 0921   BILITOT 0.27 11/16/2013 0921   GFRNONAA 108 01/19/2016 1428   GFRAA 125 01/19/2016 1428   Lab Results  Component Value Date   WBC 9.2 11/16/2013   NEUTROABS 5.7 11/16/2013   HGB 16.1 11/16/2013   HCT 46.8 11/16/2013   MCV 91.1 11/16/2013   PLT 279 11/16/2013    Imaging: [facility] Clinician Interpretation: I have personally reviewed the CNS images as listed.  My interpretation, in the context of the patient's clinical presentation, is stable disease  Mr Brain W Wo Contrast  Result Date: 03/29/2017 CLINICAL DATA:  Followup astrocytoma treated with biopsy,  chemotherapy and radiation. EXAM: MRI HEAD WITHOUT AND WITH CONTRAST TECHNIQUE: Multiplanar, multiecho pulse sequences of the brain and surrounding structures were obtained without and with intravenous contrast. CONTRAST:  17mL MULTIHANCE GADOBENATE DIMEGLUMINE 529 MG/ML IV SOLN COMPARISON:  08/18/2016.  06/04/2016.  02/26/2017. FINDINGS: Brain: Punctate focus of restricted diffusion in the left frontal white matter, separate from the confluent region of abnormal T2 signal, newly seen. Restricted diffusion in the left side of the midportion of the corpus callosum, newly seen. Small punctate foci of restricted diffusion in the posterior body of the corpus callosum, newly seen. No change in the overall pattern of postsurgical changes of the right frontal lobe with postoperative cystic spaces. Diffuse T2 and FLAIR signal within the frontal white matter right more than left in corpus callosum is unchanged. No evidence of increasing mass effect. Ventricular size is stable. With respect to contrast enhancement, foci of enhancement previously seen in the right corona radiata, right frontal white matter and anterior central corpus callosum have all resolved. New/more prominent foci of enhancement corresponding to the areas of restricted diffusion in the left frontal white matter, left side of the midportion of the corpus callosum and posterior body of the corpus callosum are noted. Particularly given the evolution of the previously seen foci, these probably relate to treatment of fact rather than do differentiation of the underlying lesion. Vascular: Major vessels at the base of the brain show flow. Skull and upper cervical spine: Negative Sinuses/Orbits: Clear/normal Other: None IMPRESSION: No increasing mass effect or change in the overall white matter signal pattern. Three foci of contrast enhancement noted on the previous study within the right corona radiata, right frontal white matter and anterior central corpus  callosum have resolved. There has been progression of what was a subtle focus of enhancement in the left mid body of the corpus callosum and there is are newly seen punctate foci of enhancement in the left frontal white matter above the region of diffuse white matter abnormality and in the posterior body of the corpus callosum. All of these show restricted diffusion. Particularly given the evolution of the previously seen foci, these probably relate to treatment effect. Electronically Signed   By: Mark  Shogry M.D.   On: 03/29/2017 17:02    Pathology:   Assessment/Plan 1. Astrocytoma of frontal lobe (HCC)    We appreciate the opportunity to participate in the care of [name].  We extensively reviewed his pathology and treatment history. Although he is clinically and radiographically stable at this time, the increase in diameter of the enhancing callosal lesion merits close follow up.    Screening for potential clinical trials was performed and discussed using eligibility criteria for active protocols at [facility], loco-regional tertiary centers, as well as national database available on [email or web address].    The patient is not a candidate for a research protocol at this time due to stable disease.   We spent twenty additional minutes teaching regarding the natural history, biology, and historical experience in the treatment of brain tumors. We then discussed in detail the current recommendations for therapy focusing on the mode of administration, mechanism of action, anticipated toxicities, and quality of life issues associated with this plan. We also provided teaching sheets for the patient to take home as an additional resource.  We recommend he return in 4 months with an MRI brain for evaluation.  Dr. Delice Lesch will continue to prescribe Dilantin for now.  For headaches, we recommended decreasing use of narcotics and analgesia, lowering caffeine intake gradually, and increasing sleep  volume.   All questions were answered. The patient knows to call the clinic with any problems, questions or concerns. No barriers to learning were detected.  The total time spent in the encounter was 60 minutes and more than 50% was on counseling and review of test results   Ventura Sellers, MD Medical Director of Neuro-Oncology [facility] at [place] 03/31/17 11:22 AM

## 2017-04-30 ENCOUNTER — Other Ambulatory Visit: Payer: Self-pay | Admitting: Radiation Oncology

## 2017-05-02 ENCOUNTER — Telehealth: Payer: Self-pay | Admitting: Radiation Oncology

## 2017-05-02 NOTE — Telephone Encounter (Signed)
Phoned patient to inquire about how frequent he is taking his pain medication, Percocet. No answer. Left message with my contact information requesting a return call.

## 2017-05-03 ENCOUNTER — Other Ambulatory Visit: Payer: Self-pay | Admitting: Radiation Oncology

## 2017-05-03 MED ORDER — OXYCODONE-ACETAMINOPHEN 5-325 MG PO TABS: 1.0000 | ORAL_TABLET | Freq: Every evening | ORAL | 0 refills | Status: DC | PRN

## 2017-05-04 ENCOUNTER — Other Ambulatory Visit: Payer: Self-pay | Admitting: Radiation Oncology

## 2017-05-04 ENCOUNTER — Telehealth: Payer: Self-pay | Admitting: Radiation Oncology

## 2017-05-04 MED ORDER — OXYCODONE-ACETAMINOPHEN 5-325 MG PO TABS: 1.0000 | ORAL_TABLET | Freq: Every evening | ORAL | 0 refills | Status: DC | PRN

## 2017-05-04 MED FILL — OXYCOD/ACETAMINOPHEN 5-325M: 5-325 | 30 days supply | Qty: 60 | Fill #0

## 2017-05-04 NOTE — Telephone Encounter (Signed)
Phoned patient back. Explained the script to [facility] has been cancelled. Went onto explain the new Percocet script has been issued to [facility]. Patient verbalized understanding and expressed appreciation for the return call.

## 2017-05-04 NOTE — Telephone Encounter (Signed)
Patient phoned and requested Percocet script be transferred to [facility]. Phoned Charlie at [facility] and cancelled script.

## 2017-05-04 NOTE — Telephone Encounter (Signed)
Phoned patient at home to make him aware his Percocet refill has been sent to [facility]. No answer. Left message detailing this information.

## 2017-06-01 ENCOUNTER — Other Ambulatory Visit: Payer: Self-pay | Admitting: Radiation Oncology

## 2017-06-01 MED ORDER — OXYCODONE-ACETAMINOPHEN 5-325 MG PO TABS
1.0000 | ORAL_TABLET | Freq: Every evening | ORAL | 0 refills | Status: DC | PRN
Start: 2017-06-01 — End: 2017-07-01

## 2017-06-01 MED FILL — OXYCOD/ACETAMINOPHEN 5-325M: 5-325 | 15 days supply | Qty: 30 | Fill #0

## 2017-06-02 ENCOUNTER — Telehealth: Payer: Self-pay | Admitting: Radiation Oncology

## 2017-06-02 NOTE — Telephone Encounter (Signed)
Phoned patient making him aware Percocet has been escribed to [facility] by Shona Simpson, PA-C. Patient verbalized understanding and expressed appreciation for the call.

## 2017-06-27 ENCOUNTER — Ambulatory Visit (INDEPENDENT_AMBULATORY_CARE_PROVIDER_SITE_OTHER): Payer: Medicare Other | Admitting: Internal Medicine

## 2017-06-27 ENCOUNTER — Encounter (INDEPENDENT_AMBULATORY_CARE_PROVIDER_SITE_OTHER): Payer: Self-pay

## 2017-06-27 ENCOUNTER — Other Ambulatory Visit: Payer: Self-pay

## 2017-06-27 ENCOUNTER — Encounter: Payer: Self-pay | Admitting: Internal Medicine

## 2017-06-27 DIAGNOSIS — Z923 Personal history of irradiation: Secondary | ICD-10-CM

## 2017-06-27 DIAGNOSIS — E119 Type 2 diabetes mellitus without complications: Secondary | ICD-10-CM

## 2017-06-27 DIAGNOSIS — Z79891 Long term (current) use of opiate analgesic: Secondary | ICD-10-CM | POA: Diagnosis not present

## 2017-06-27 DIAGNOSIS — Z0000 Encounter for general adult medical examination without abnormal findings: Secondary | ICD-10-CM

## 2017-06-27 DIAGNOSIS — Z7984 Long term (current) use of oral hypoglycemic drugs: Secondary | ICD-10-CM | POA: Diagnosis not present

## 2017-06-27 DIAGNOSIS — Z79899 Other long term (current) drug therapy: Secondary | ICD-10-CM | POA: Diagnosis not present

## 2017-06-27 DIAGNOSIS — Z720 Tobacco use: Secondary | ICD-10-CM

## 2017-06-27 DIAGNOSIS — C711 Malignant neoplasm of frontal lobe: Secondary | ICD-10-CM

## 2017-06-27 DIAGNOSIS — R062 Wheezing: Secondary | ICD-10-CM | POA: Diagnosis not present

## 2017-06-27 DIAGNOSIS — F17210 Nicotine dependence, cigarettes, uncomplicated: Secondary | ICD-10-CM | POA: Diagnosis not present

## 2017-06-27 DIAGNOSIS — Z85841 Personal history of malignant neoplasm of brain: Secondary | ICD-10-CM | POA: Diagnosis not present

## 2017-06-27 LAB — POCT GLYCOSYLATED HEMOGLOBIN (HGB A1C): Hemoglobin A1C: 9.4

## 2017-06-27 LAB — GLUCOSE, CAPILLARY: Glucose-Capillary: 223 mg/dL — ABNORMAL HIGH (ref 65–99)

## 2017-06-27 MED ORDER — METFORMIN HCL 1000 MG PO TABS: 1000.0000 mg | ORAL_TABLET | Freq: Two times a day (BID) | ORAL | 2 refills | Status: DC

## 2017-06-27 MED ORDER — METFORMIN HCL 1000 MG PO TABS: 1000.0000 mg | ORAL_TABLET | Freq: Every day | ORAL | 0 refills | Status: DC

## 2017-06-27 MED FILL — metFORMIN HCL 1000 MG TABS: 1000 | 30 days supply | Qty: 60 | Fill #0

## 2017-06-27 NOTE — Assessment & Plan Note (Signed)
Patient has a history of type 2 diabetes diagnosed in 2013 that was previously well controlled on metformin 1000 mg daily. Unfortunately A1c today has increased from 6.9 -> 9.4 over the last 7 months. He reports a very poor diet, including bagels and foods high in carbohydrates breakfast. He drinks multiple Mountain dews a day and has been eating ice cream daily. He is motivated to make dietary changes, and would like to avoid adding second agent. He is agreeable to increasing his metformin to 1000 mg twice daily.  -- Encouraged dietary changes -- Increased metformin 1,000 mg BID -- F/u 3 months

## 2017-06-27 NOTE — Progress Notes (Signed)
   CC: DM follow up  HPI:  Jonathan Barker is a 52 y.o. male with past medical history outlined below here for DM follow up. For the details of today's visit, please refer to the assessment and plan.  Past Medical History:  Diagnosis Date  . Astrocytoma of frontal lobe ([place]) 05/31/2013  . DM (diabetes mellitus) ([place]) 08/09/2011   A1c at diagnosis = 12.4  . Family history of breast cancer   . GERD (gastroesophageal reflux disease) 08/09/2011  . Seizure ([place]) 05/19/2013  . Shortness of breath     Review of Systems  Respiratory: Positive for wheezing.   Neurological: Positive for headaches.    Physical Exam:  Vitals:   06/27/17 1607  BP: 121/88  Pulse: (!) 116  Temp: 98.5 F (36.9 C)  TempSrc: Oral  SpO2: 97%  Weight: 188 lb 9.6 oz (85.5 kg)    Constitutional: NAD, appears comfortable, Thin chronically ill appearing male HEENT: Right frontal scalp with well healed scar Cardiovascular: RRR Pulmonary/Chest: CTAB Extremities: Warm and well perfused. Distal pulses intact. No edema.  Neurological: A&Ox3, CN II - XII grossly intact. No focal deficits Psychiatric: Normal mood and affect    Assessment & Plan:   See Encounters Tab for problem based charting.  Patient discussed with Dr. Dareen Piano

## 2017-06-27 NOTE — Assessment & Plan Note (Signed)
Patient is motivated to quit, currently smoking 1 pack a day. He recently purchased Nicorette gum. Encourage cessation.

## 2017-06-27 NOTE — Assessment & Plan Note (Signed)
Patient has declined Pneumovax, flu shot, and colon cancer screening. Will continue to address at future visits.

## 2017-06-27 NOTE — Assessment & Plan Note (Signed)
Status post surgical resection, radiotherapy, and temodar therapy in 2015. Disease is stable per oncology note in November 2018. He also follows with neurology who prescribes his Dilantin. He denies any recent seizure activity. He is scheduled for repeat MRI in 2 weeks. Headaches are well controlled on Percocet. -- F/u Oncology, neurology  -- Will f/u MRI results

## 2017-06-27 NOTE — Patient Instructions (Signed)
Mr. Riolo,  It was a pleasure seeing you. Your diabetes lab, the A1c, is elevated today. I have increased your metformin to 1,000 mg twice daily. Please try and cut back on the sodas, ice cream, and bagels. I would like to see you back in three month to follow up. If you have any questions or concerns, call our clinic at [phone] or after hours call [phone] and ask for the internal medicine resident on call. Thank you!  - Dr. Philipp Ovens

## 2017-06-29 NOTE — Progress Notes (Signed)
Internal Medicine Clinic Attending  Case discussed with Dr. Guilloud at the time of the visit.  We reviewed the resident's history and exam and pertinent patient test results.  I agree with the assessment, diagnosis, and plan of care documented in the resident's note.  

## 2017-06-29 NOTE — Addendum Note (Signed)
Addended by: Aldine Contes on: 06/29/2017 03:13 PM   Modules accepted: Level of Service

## 2017-07-01 ENCOUNTER — Other Ambulatory Visit: Payer: Self-pay | Admitting: Radiation Oncology

## 2017-07-01 ENCOUNTER — Telehealth: Payer: Self-pay | Admitting: Radiation Oncology

## 2017-07-01 ENCOUNTER — Telehealth: Payer: Self-pay | Admitting: *Deleted

## 2017-07-01 MED FILL — PHENYTOIN SOD EXT 100 MG CA: 100 | 90 days supply | Qty: 270 | Fill #1

## 2017-07-01 NOTE — Telephone Encounter (Signed)
Received voicemail from patient requesting return call. Phoned patient back. Patient requesting refill of his Percocet prescription. Explained Shona Simpson, PA-C sent 15 tablets to his pharmacy earlier this morning electronically. Patient expressed that he is displeased he was "only prescribed 15 tablets." Reinforced that tapering off the percocet has been the plan since his consultation with Dr. Mickeal Skinner. Patient states, "I have pain in my head at night.Marland Kitcheni need that medicine .Marland KitchenI ain't abusing it." Patient understands this RN will inform Shona Simpson, PA-C of his frustration.

## 2017-07-01 NOTE — Telephone Encounter (Signed)
Radiation staff received call from patient stating that he wished to continue to see [facility] for neurological/oncological needs.  Scans have been changed to [place] per patient preference per [place].

## 2017-07-04 ENCOUNTER — Other Ambulatory Visit: Payer: Self-pay | Admitting: Radiation Oncology

## 2017-07-04 MED ORDER — OXYCODONE-ACETAMINOPHEN 5-325 MG PO TABS: 1.0000 | ORAL_TABLET | Freq: Every evening | ORAL | 0 refills | Status: DC | PRN

## 2017-07-04 MED FILL — OXYCOD/ACETAMINOPHEN 5-325M: 5-325 | 8 days supply | Qty: 15 | Fill #0

## 2017-07-22 NOTE — Progress Notes (Signed)
Xabi A. Castelluccio 52 y.o.man with history of WHO grade II astrocytoma with concerns for recurrent disease radiation completed 09-03-13 review 08-18-16 MRI brain w wo contrast  FU.    Headache:Having headaches in the mornings and nights at times; had a headache this morning taking Advil. Dizziness:No Seizures: No on Dilantin Nausea/vomiting:No Visual changes/Diplopia:No Ringing in ears:No Fatigue:Having fatigue all of the time. Cognitive changes:Pt alert & oriented x 3 with fluent speech,denies any memory problems with short or long term memory. Weight: Wt Readings from Last 3 Encounters:  07/27/17 187 lb 6.4 oz (85 kg)  06/27/17 188 lb 9.6 oz (85.5 kg)  03/31/17 185 lb 1.6 oz (84 kg)  BP (!) 126/94 (BP Location: Right Arm, Patient Position: Sitting, Cuff Size: Normal)   Pulse (!) 106   Temp 97.8 F (36.6 C) (Oral)   Resp 20   Ht (!) 6" (0.152 m)   Wt 187 lb 6.4 oz (85 kg)   SpO2 98%   BMI 3659.91 kg/m

## 2017-07-25 ENCOUNTER — Ambulatory Visit
Admission: RE | Admit: 2017-07-25 | Discharge: 2017-07-25 | Disposition: A | Discharge status: Home / Self Care | Payer: Medicare Other | Source: Ambulatory Visit | Attending: Internal Medicine | Admitting: Internal Medicine

## 2017-07-25 ENCOUNTER — Ambulatory Visit (HOSPITAL_COMMUNITY): Payer: Medicare Other

## 2017-07-25 DIAGNOSIS — C711 Malignant neoplasm of frontal lobe: Secondary | ICD-10-CM | POA: Diagnosis not present

## 2017-07-25 MED ORDER — GADOBENATE DIMEGLUMINE 529 MG/ML IV SOLN
17.0000 mL | Freq: Once | INTRAVENOUS | Status: AC | PRN
  Administered 2017-07-25: 17 mL via INTRAVENOUS

## 2017-07-27 ENCOUNTER — Other Ambulatory Visit: Payer: Self-pay

## 2017-07-27 ENCOUNTER — Ambulatory Visit
Admission: RE | Admit: 2017-07-27 | Discharge: 2017-07-27 | Disposition: A | Discharge status: Home / Self Care | Payer: Medicare Other | Source: Ambulatory Visit | Attending: Urology | Admitting: Urology

## 2017-07-27 ENCOUNTER — Encounter: Payer: Self-pay | Admitting: Urology

## 2017-07-27 VITALS — BP 126/94 | HR 106 | Temp 97.8°F | Resp 20 | Ht <= 58 in | Wt 187.4 lb

## 2017-07-27 DIAGNOSIS — Z803 Family history of malignant neoplasm of breast: Secondary | ICD-10-CM | POA: Insufficient documentation

## 2017-07-27 DIAGNOSIS — Z7984 Long term (current) use of oral hypoglycemic drugs: Secondary | ICD-10-CM | POA: Insufficient documentation

## 2017-07-27 DIAGNOSIS — Z9889 Other specified postprocedural states: Secondary | ICD-10-CM | POA: Insufficient documentation

## 2017-07-27 DIAGNOSIS — Z08 Encounter for follow-up examination after completed treatment for malignant neoplasm: Secondary | ICD-10-CM | POA: Diagnosis not present

## 2017-07-27 DIAGNOSIS — R51 Headache: Secondary | ICD-10-CM | POA: Diagnosis not present

## 2017-07-27 DIAGNOSIS — C711 Malignant neoplasm of frontal lobe: Secondary | ICD-10-CM | POA: Insufficient documentation

## 2017-07-27 DIAGNOSIS — F17210 Nicotine dependence, cigarettes, uncomplicated: Secondary | ICD-10-CM | POA: Diagnosis not present

## 2017-07-27 DIAGNOSIS — Z79891 Long term (current) use of opiate analgesic: Secondary | ICD-10-CM | POA: Diagnosis not present

## 2017-07-27 DIAGNOSIS — G8929 Other chronic pain: Secondary | ICD-10-CM | POA: Diagnosis not present

## 2017-07-27 DIAGNOSIS — Z79899 Other long term (current) drug therapy: Secondary | ICD-10-CM | POA: Insufficient documentation

## 2017-07-27 DIAGNOSIS — Z923 Personal history of irradiation: Secondary | ICD-10-CM | POA: Diagnosis not present

## 2017-07-27 DIAGNOSIS — E119 Type 2 diabetes mellitus without complications: Secondary | ICD-10-CM | POA: Diagnosis not present

## 2017-07-27 MED ORDER — OXYCODONE-ACETAMINOPHEN 5-325 MG PO TABS: 1.0000 | ORAL_TABLET | Freq: Every evening | ORAL | 0 refills | Status: DC | PRN

## 2017-07-27 MED FILL — OXYCOD/ACETAMINOPHEN 5-325M: 5-325 | 45 days supply | Qty: 90 | Fill #0

## 2017-07-27 NOTE — Progress Notes (Signed)
Radiation Oncology         [phone]  Multidisciplinary Brain and Spine Oncology Clinic Follow-Up Visit Note  CC: Jonathan Ochs, MD  Jonathan Pall, MD    ICD-10-CM   1. Astrocytoma of frontal lobe (HCC) C71.1         Diagnosis:   52 y.o.  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 gentleman who was treated for a primary brain tumor with surgical resection followed by conventional radiotherapy for his grade 2 astrocytoma, with concurrent Temodar. He has chronic pain from a surgical dehiscence from his craniotomy and night time headaches.  He has been followed closely in the multidisciplinary clinic, and has been referred to Jonathan Barker in the summer due to an interval MRI scan changes, concerning for progression in the summer of 2017. He met with Jonathan Barker and she did not recommend treatment at that time. He has been followed with punctate foci along the previous resection and treatment site but subsequent disease surveillance scans have shown disease stability.  Recent brain MRI 07/25/17 confirms disease stability with no worsening or progressive findings and in the case of the left corpus callosum lesion, the enhancement is actually improved. He comes to review these results today.  On review of systems, the patient reports that he is doing well overall. He continues to have daily headaches and takes Percocet prn for this. He also uses ibuprofen alternately which helps together with the narcotics. He continues to be reliable in the timing of requesting new prescriptions. He denies any chest pain, shortness of breath, cough, fevers, chills, night sweats, unintended weight changes. He denies any bowel or bladder disturbances, and denies abdominal pain, nausea or vomiting. He denies  any new musculoskeletal or joint aches or pains, new skin lesions or concerns. A complete review of systems is obtained and is otherwise negative.  Past Medical History:  Past Medical History:  Diagnosis Date  . Astrocytoma of frontal lobe ([place]) 05/31/2013  . DM (diabetes mellitus) ([place]) 08/09/2011   A1c at diagnosis = 12.4  . Family history of breast cancer   . GERD (gastroesophageal reflux disease) 08/09/2011  . Seizure ([place]) 05/19/2013  . Shortness of breath     Past Surgical History: Past Surgical History:  Procedure Laterality Date  . APPENDECTOMY    . BRAIN BIOPSY N/A 05/24/2013   Procedure: Craniotomy for open biopsy;  Surgeon: Jonathan Cunas, MD;  Location: [facility];  Service: Neurosurgery;  Laterality: N/A;  Craniotomy for open biopsy    Social History:  Social History   Socioeconomic History  . Marital status: Significant Other    Spouse name: Not on file  . Number of children: 2  . Years of education: Not on file  . Highest education level: Not on file  Occupational History    Employer: [employer]  Social Needs  . Financial resource strain: Not on file  . Food insecurity:    Worry: Not on file    Inability: Not on file  . Transportation needs:    Medical: Not on file    Non-medical: Not on file  Tobacco Use  . Smoking status: Current Every Day Smoker    Packs/day: 1.00    Years: 33.00    Pack years: 33.00  Types: Cigarettes  . Smokeless tobacco: Never Used  . Tobacco comment: Trying to quit. Down to 1 ppd, 10/11/13 down to 1/2 ppd  Substance and Sexual Activity  . Alcohol use: Yes    Alcohol/week: 0.0 oz    Comment: social  . Drug use: Yes    Types: Marijuana    Comment: Marijuana daily  . Sexual activity: Not on file  Lifestyle  . Physical activity:    Days per week: Not on file    Minutes per session: Not on file  . Stress: Not on file  Relationships  . Social connections:    Talks on phone: Not on file    Gets together: Not  on file    Attends religious service: Not on file    Active member of club or organization: Not on file    Attends meetings of clubs or organizations: Not on file    Relationship status: Not on file  . Intimate partner violence:    Fear of current or ex partner: Not on file    Emotionally abused: Not on file    Physically abused: Not on file    Forced sexual activity: Not on file  Other Topics Concern  . Not on file  Social History Narrative   [place]. He does not work now.     Family History: Family History  Problem Relation Age of Onset  . Breast cancer Mother        dx in Jonathan Barker 66s-60s  . Bone cancer Mother   . Diabetes Father   . Heart disease Father   . Diabetes Brother   . Diabetes Paternal Uncle     ALLERGIES:  has No Known Allergies.  Meds: Current Outpatient Medications  Medication Sig Dispense Refill  . metFORMIN (GLUCOPHAGE) 1000 MG tablet Take 1 tablet (1,000 mg total) by mouth 2 (two) times daily with a meal. 60 tablet 2  . nicotine polacrilex (NICORETTE) 4 MG gum Take 1 each (4 mg total) by mouth as needed for smoking cessation. 100 tablet 0  . phenytoin (DILANTIN) 100 MG ER capsule Take 3 capsules every morning 270 capsule 3  . methocarbamol (ROBAXIN) 500 MG tablet Take 1 tablet (500 mg total) by mouth 2 (two) times daily. (Patient not taking: Reported on 03/31/2017) 20 tablet 0  . naproxen (NAPROSYN) 500 MG tablet Take 1 tablet (500 mg total) by mouth 2 (two) times daily. (Patient not taking: Reported on 03/31/2017) 30 tablet 0  . oxyCODONE-acetaminophen (PERCOCET/ROXICET) 5-325 MG tablet Take 1 tablet by mouth at bedtime and may repeat dose one time if needed. 90 tablet 0   No current facility-administered medications for this encounter.     Physical Findings: Wt Readings from Last 3 Encounters:  07/27/17 187 lb 6.4 oz (85 kg)  06/27/17 188 lb 9.6 oz (85.5 kg)  03/31/17 185 lb 1.6 oz (84 kg)   Temp Readings from Last 3  Encounters:  07/27/17 97.8 F (36.6 C) (Oral)  06/27/17 98.5 F (36.9 C) (Oral)  03/31/17 97.6 F (36.4 C) (Oral)   BP Readings from Last 3 Encounters:  07/27/17 (!) 126/94  06/27/17 121/88  03/31/17 140/89   Pulse Readings from Last 3 Encounters:  07/27/17 (!) 106  06/27/17 (!) 116  03/31/17 86   In general this is a well appearing caucasian male in no acute distress. He's alert and oriented x4 and appropriate throughout the examination. Cardiopulmonary assessment is negative for acute distress and he  exhibits normal effort. No focal neurologic abnormalities are noted.  Lab Findings: Lab Results  Component Value Date   WBC 9.2 11/16/2013   HGB 16.1 11/16/2013   HCT 46.8 11/16/2013   MCV 91.1 11/16/2013   PLT 279 11/16/2013   Radiographic Findings: Jonathan Barker IN Contrast  Result Date: 07/25/2017 CLINICAL DATA:  Follow-up right frontal grade 2 astrocytoma treated with biopsy, chemotherapy and radiation. Creatinine was obtained on site at [facility] at [address] Results: Creatinine 0.7 mg/dL. EXAM: MRI HEAD WITHOUT AND WITH CONTRAST TECHNIQUE: Multiplanar, multiecho pulse sequences of the brain and surrounding structures were obtained without and with intravenous contrast. CONTRAST:  67m MULTIHANCE GADOBENATE DIMEGLUMINE 529 MG/ML IV SOLN COMPARISON:  03/29/2017 FINDINGS: Brain: Punctate focus of enhancement chronically seen in the right side of the pons consistent with a small developmental venous anomaly. No cerebellar abnormality. Previous right frontal craniotomy for biopsy. Post biopsy spaces appear the same. T2 and FLAIR signal within both frontal lobes, right more than left, appears stable without evidence of increasing edema or mass effect. Punctate focus of enhancement in the left frontal white matter image 126 appears the same. Minimal residual enhancement visible within the left side of the body of the corpus callosum, improved. Enhancement within the  central posterior body of the corpus callosum, image 102 is stable. Small focus of enhancement in the right external capsule region appears the same. No new or progressive foci of enhancement are seen. No sign of ischemic infarction. No hydrocephalus or extra-axial collection. No leptomeningeal enhancement. Vascular: Major vessels at the base of the brain show flow. Skull and upper cervical spine: Otherwise negative Sinuses/Orbits: Clear/normal Other: None IMPRESSION: No worsening or progressive findings. No evidence of increased mass effect or white matter signal. Few scattered foci of contrast enhancement are stable or, in the case of the left corpus callosum focus, regressed. Findings are felt most consistent with treatment effect. Electronically Signed   By: MNelson ChimesM.D.   On: 07/25/2017 14:56     Impression/Plan: 1. 52y.o. gentlelman with history of WHO grade II astrocytoma. The patient continues to be stable clinically as well as radiographically, and his films were reviewed this morning in brain conference. No evidence of tumor-like enhancement or disease progression has been identified and the few scattered foci of contrast enhancement are stable or, in the case of the left corpus callosum focus, regressed. These findings are felt most consistent with treatment effect and we will continue to follow this expectantly. Recommendations for him to return in 3 months time for a repeat MRI of the brain.  He has met with Dr. VMickeal Skinnerpreviously but prefers to continue his follow up care with Dr. MTammi Klippel  2. Chronic headaches and skull pain. The patient continues to be managed well with ibuprofen and Percocet. He remains reliable in the timing of requesting these medication prescriptions, and continues to be stable with this current regimen. Prescription refilled today.     ANicholos Johns PA-C

## 2017-07-28 ENCOUNTER — Ambulatory Visit: Payer: Self-pay | Admitting: Internal Medicine

## 2017-07-29 ENCOUNTER — Ambulatory Visit: Payer: Self-pay | Admitting: Internal Medicine

## 2017-08-17 ENCOUNTER — Other Ambulatory Visit: Payer: Self-pay | Admitting: Radiation Therapy

## 2017-08-17 DIAGNOSIS — C719 Malignant neoplasm of brain, unspecified: Secondary | ICD-10-CM

## 2017-08-25 ENCOUNTER — Telehealth: Payer: Self-pay | Admitting: Radiation Oncology

## 2017-08-25 NOTE — Telephone Encounter (Signed)
Received call this morning from patient requesting a refill of his Percocet. Phoned patient back and explained that [place], [place] does not want to get back in the habit of refilling his Percocet. Patient expressed his frustration and intent to call back Monday and speak with Ashlyn, PA.

## 2017-08-30 ENCOUNTER — Telehealth: Payer: Self-pay | Admitting: Radiation Oncology

## 2017-08-30 ENCOUNTER — Other Ambulatory Visit: Payer: Self-pay | Admitting: Urology

## 2017-08-30 MED ORDER — OXYCODONE-ACETAMINOPHEN 5-325 MG PO TABS: 1.0000 | ORAL_TABLET | Freq: Every evening | ORAL | 0 refills | Status: DC | PRN

## 2017-08-30 NOTE — Telephone Encounter (Signed)
Phoned patient as requested by [name], PA-C. I read the following message to the patient verbatim: Please let him know that I sent the refill to the [facility] outpatient pharmacy. Please remind him to use these sparingly, as we had discussed at last visit, only when ibuprofen does not work for his headaches. I am giving him 90 pills so these prescriptions should last 1.5 months... So... this will not be refilled until 10/10/17. Patient verbalized understanding and expressed his intentions to go pick up the medication.

## 2017-09-08 MED FILL — OXYCODONE-ACETAMINOPHEN 5-3: 5-325 | 45 days supply | Qty: 90 | Fill #0

## 2017-10-03 ENCOUNTER — Encounter: Payer: Self-pay | Admitting: Internal Medicine

## 2017-10-10 MED FILL — PHENYTOIN SOD EXT 100 MG CA: 100 | 90 days supply | Qty: 270 | Fill #2

## 2017-10-12 ENCOUNTER — Other Ambulatory Visit: Payer: Self-pay | Admitting: Urology

## 2017-10-12 ENCOUNTER — Telehealth: Payer: Self-pay | Admitting: Radiation Oncology

## 2017-10-12 MED FILL — OXYCODONE-ACETAMINOPHEN 5-3: 5-325 | 45 days supply | Qty: 90 | Fill #0

## 2017-10-12 NOTE — Telephone Encounter (Signed)
Faxed back signed CONTROLLED SUBSTANCE REFILL REQUEST form for oxycodone-acetaminophen to [facility]. Confirmation fax of delivery obtained.

## 2017-10-17 ENCOUNTER — Encounter: Payer: Self-pay | Admitting: Internal Medicine

## 2017-10-27 ENCOUNTER — Ambulatory Visit
Admission: RE | Admit: 2017-10-27 | Discharge: 2017-10-27 | Disposition: A | Discharge status: Home / Self Care | Payer: Medicare Other | Source: Ambulatory Visit | Attending: Radiation Oncology | Admitting: Radiation Oncology

## 2017-10-27 DIAGNOSIS — C712 Malignant neoplasm of temporal lobe: Secondary | ICD-10-CM | POA: Diagnosis not present

## 2017-10-27 DIAGNOSIS — C719 Malignant neoplasm of brain, unspecified: Secondary | ICD-10-CM

## 2017-10-27 MED ORDER — GADOBENATE DIMEGLUMINE 529 MG/ML IV SOLN
17.0000 mL | Freq: Once | INTRAVENOUS | Status: AC | PRN
  Administered 2017-10-27: 17 mL via INTRAVENOUS

## 2017-10-28 ENCOUNTER — Other Ambulatory Visit: Payer: Self-pay | Admitting: Urology

## 2017-10-28 MED ORDER — OXYCODONE-ACETAMINOPHEN 5-325 MG PO TABS: 1.0000 | ORAL_TABLET | Freq: Every evening | ORAL | 0 refills | Status: DC | PRN

## 2017-10-31 ENCOUNTER — Other Ambulatory Visit: Payer: Self-pay

## 2017-10-31 ENCOUNTER — Encounter (HOSPITAL_COMMUNITY): Payer: Self-pay

## 2017-10-31 ENCOUNTER — Ambulatory Visit
Admission: RE | Admit: 2017-10-31 | Discharge: 2017-10-31 | Disposition: A | Discharge status: Home / Self Care | Payer: Medicare Other | Source: Ambulatory Visit | Attending: Radiation Oncology | Admitting: Radiation Oncology

## 2017-10-31 ENCOUNTER — Encounter: Payer: Self-pay | Admitting: Radiation Oncology

## 2017-10-31 ENCOUNTER — Ambulatory Visit (HOSPITAL_COMMUNITY)
Admission: EM | Admit: 2017-10-31 | Discharge: 2017-10-31 | Disposition: A | Discharge status: Home / Self Care | Payer: Medicare Other | Attending: Family Medicine | Admitting: Family Medicine

## 2017-10-31 VITALS — BP 136/96 | HR 110 | Temp 98.0°F | Resp 20 | Ht <= 58 in | Wt 185.6 lb

## 2017-10-31 DIAGNOSIS — G8929 Other chronic pain: Secondary | ICD-10-CM | POA: Insufficient documentation

## 2017-10-31 DIAGNOSIS — H60392 Other infective otitis externa, left ear: Secondary | ICD-10-CM | POA: Diagnosis not present

## 2017-10-31 DIAGNOSIS — Z79891 Long term (current) use of opiate analgesic: Secondary | ICD-10-CM | POA: Diagnosis not present

## 2017-10-31 DIAGNOSIS — H60502 Unspecified acute noninfective otitis externa, left ear: Secondary | ICD-10-CM | POA: Diagnosis not present

## 2017-10-31 DIAGNOSIS — Z08 Encounter for follow-up examination after completed treatment for malignant neoplasm: Secondary | ICD-10-CM | POA: Diagnosis not present

## 2017-10-31 DIAGNOSIS — C719 Malignant neoplasm of brain, unspecified: Secondary | ICD-10-CM

## 2017-10-31 DIAGNOSIS — H9202 Otalgia, left ear: Secondary | ICD-10-CM

## 2017-10-31 DIAGNOSIS — F17210 Nicotine dependence, cigarettes, uncomplicated: Secondary | ICD-10-CM | POA: Diagnosis not present

## 2017-10-31 DIAGNOSIS — Z7984 Long term (current) use of oral hypoglycemic drugs: Secondary | ICD-10-CM | POA: Insufficient documentation

## 2017-10-31 DIAGNOSIS — Z923 Personal history of irradiation: Secondary | ICD-10-CM | POA: Diagnosis not present

## 2017-10-31 DIAGNOSIS — E119 Type 2 diabetes mellitus without complications: Secondary | ICD-10-CM | POA: Insufficient documentation

## 2017-10-31 DIAGNOSIS — M2548 Effusion, other site: Secondary | ICD-10-CM | POA: Diagnosis not present

## 2017-10-31 DIAGNOSIS — H6092 Unspecified otitis externa, left ear: Secondary | ICD-10-CM | POA: Insufficient documentation

## 2017-10-31 DIAGNOSIS — Z9221 Personal history of antineoplastic chemotherapy: Secondary | ICD-10-CM | POA: Insufficient documentation

## 2017-10-31 DIAGNOSIS — C711 Malignant neoplasm of frontal lobe: Secondary | ICD-10-CM | POA: Diagnosis not present

## 2017-10-31 DIAGNOSIS — R51 Headache: Secondary | ICD-10-CM | POA: Insufficient documentation

## 2017-10-31 MED ORDER — IBUPROFEN 800 MG PO TABS: 800.0000 mg | ORAL_TABLET | Freq: Three times a day (TID) | ORAL | 0 refills | Status: DC

## 2017-10-31 MED ORDER — NEOMYCIN-POLYMYXIN-HC 3.5-10000-1 OT SUSP: 3.0000 [drp] | Freq: Four times a day (QID) | OTIC | 0 refills | Status: AC

## 2017-10-31 MED FILL — NEO/POLYMYXIN/HC EAR SUSP: 3.5-10000-1 | 7 days supply | Qty: 10 | Fill #0

## 2017-10-31 MED FILL — IBUPROFEN 800 MG TAB: 800 | 7 days supply | Qty: 21 | Fill #0

## 2017-10-31 NOTE — Discharge Instructions (Signed)
Complete course of ear drops.  Ibuprofen as needed for pain.  If symptoms worsen or do not improve in the next week to return to be seen or to follow up with your PCP.

## 2017-10-31 NOTE — ED Provider Notes (Signed)
[facility]    CSN: [id number] Arrival date & time: 10/31/17  1001     History   Chief Complaint Chief Complaint  Patient presents with  . Otalgia    HPI Jonathan Barker is a 52 y.o. male.   Jonathan Barker presents with complaints of pain and fluid to left ear which started approximately 3-4 days ago. Pain is quite severe. States feels like he hears a "pin ball" in his ear but no decrease in hearing. Tried OTC ear drops as well as peroxide which has not helped. Denies uri symptoms, sore throat, or fever. No teeth, no dental pain. Took percocet which did not help. Hx of gerd, dm, headache. Smokes.    ROS per HPI.      Past Medical History:  Diagnosis Date  . Astrocytoma of frontal lobe ([place]) 05/31/2013  . DM (diabetes mellitus) ([place]) 08/09/2011   A1c at diagnosis = 12.4  . Family history of breast cancer   . GERD (gastroesophageal reflux disease) 08/09/2011  . Seizure ([place]) 05/19/2013  . Shortness of breath     Patient Active Problem List   Diagnosis Date Noted  . Chronic headaches 06/11/2016  . Localization-related symptomatic epilepsy and epileptic syndromes with simple partial seizures, not intractable, without status epilepticus ([place]) 04/15/2015  . Healthcare maintenance 02/10/2015  . Astrocytoma of frontal lobe ([place]) 05/31/2013  . Tobacco abuse 09/09/2011  . Diabetes mellitus ([place]) 08/09/2011  . HLD (hyperlipidemia) 08/09/2011    Past Surgical History:  Procedure Laterality Date  . APPENDECTOMY    . BRAIN BIOPSY N/A 05/24/2013   Procedure: Craniotomy for open biopsy;  Surgeon: Winfield Cunas, MD;  Location: [facility];  Service: Neurosurgery;  Laterality: N/A;  Craniotomy for open biopsy       Home Medications    Prior to Admission medications   Medication Sig Start Date End Date Taking? Authorizing Provider  metFORMIN (GLUCOPHAGE) 1000 MG tablet Take 1 tablet (1,000 mg total) by mouth 2 (two) times daily with a meal. 06/27/17  Yes Velna Ochs, MD  oxyCODONE-acetaminophen (PERCOCET/ROXICET) 5-325 MG tablet Take 1 tablet by mouth at bedtime and may repeat dose one time if needed. 11/09/17  Yes Bruning, Ashlyn, PA-C  phenytoin (DILANTIN) 100 MG ER capsule Take 3 capsules every morning 03/21/17  Yes Cameron Sprang, MD  ibuprofen (ADVIL,MOTRIN) 800 MG tablet Take 1 tablet (800 mg total) by mouth 3 (three) times daily. 10/31/17   Zigmund Gottron, NP  methocarbamol (ROBAXIN) 500 MG tablet Take 1 tablet (500 mg total) by mouth 2 (two) times daily. Patient not taking: Reported on 03/31/2017 08/11/16   Delia Heady, PA-C  naproxen (NAPROSYN) 500 MG tablet Take 1 tablet (500 mg total) by mouth 2 (two) times daily. Patient not taking: Reported on 03/31/2017 08/11/16   Delia Heady, PA-C  neomycin-polymyxin-hydrocortisone (CORTISPORIN) 3.5-10000-1 OTIC suspension Place 3 drops into the left ear 4 (four) times daily for 7 days. 10/31/17 11/07/17  Zigmund Gottron, NP  nicotine polacrilex (NICORETTE) 4 MG gum Take 1 each (4 mg total) by mouth as needed for smoking cessation. 08/25/15   Loleta Chance, MD    Family History Family History  Problem Relation Age of Onset  . Breast cancer Mother        dx in her 54s-60s  . Bone cancer Mother   . Diabetes Father   . Heart disease Father   . Diabetes Brother   . Diabetes Paternal Uncle     Social History  Social History   Tobacco Use  . Smoking status: Current Every Day Smoker    Packs/day: 1.00    Years: 33.00    Pack years: 33.00    Types: Cigarettes  . Smokeless tobacco: Never Used  . Tobacco comment: Trying to quit. Down to 1 ppd, 10/11/13 down to 1/2 ppd  Substance Use Topics  . Alcohol use: Yes    Alcohol/week: 0.0 oz    Comment: social  . Drug use: Yes    Types: Marijuana    Comment: Marijuana daily     Allergies   Patient has no known allergies.   Review of Systems Review of Systems   Physical Exam Triage Vital Signs ED Triage Vitals  Enc Vitals Group     BP  10/31/17 1029 129/87     Pulse Rate 10/31/17 1029 90     Resp 10/31/17 1029 20     Temp 10/31/17 1029 97.9 F (36.6 C)     Temp Source 10/31/17 1029 Oral     SpO2 10/31/17 1029 96 %     Weight --      Height --      Head Circumference --      Peak Flow --      Pain Score 10/31/17 1028 8     Pain Loc --      Pain Edu? --      Excl. in [place]? --    No data found.  Updated Vital Signs BP 129/87 (BP Location: Right Arm)   Pulse 90   Temp 97.9 F (36.6 C) (Oral)   Resp 20   SpO2 96%   Visual Acuity Right Eye Distance:   Left Eye Distance:   Bilateral Distance:    Right Eye Near:   Left Eye Near:    Bilateral Near:     Physical Exam  Constitutional: He is oriented to person, place, and time. He appears well-developed and well-nourished.  HENT:  Head: Normocephalic and atraumatic.  Right Ear: Tympanic membrane, external ear and ear canal normal.  Left Ear: Tympanic membrane normal. There is drainage, swelling and tenderness.  Nose: Nose normal. Right sinus exhibits no maxillary sinus tenderness and no frontal sinus tenderness. Left sinus exhibits no maxillary sinus tenderness and no frontal sinus tenderness.  Mouth/Throat: Uvula is midline, oropharynx is clear and moist and mucous membranes are normal.  Eyes: Pupils are equal, round, and reactive to light. Conjunctivae are normal.  Neck: Normal range of motion.  Cardiovascular: Normal rate and regular rhythm.  Pulmonary/Chest: Effort normal and breath sounds normal.  Lymphadenopathy:    He has no cervical adenopathy.  Neurological: He is alert and oriented to person, place, and time.  Skin: Skin is warm and dry.  Vitals reviewed.    UC Treatments / Results  Labs (all labs ordered are listed, but only abnormal results are displayed) Labs Reviewed - No data to display  EKG None  Radiology No results found.  Procedures Procedures (including critical care time)  Medications Ordered in UC Medications - No data  to display  Initial Impression / Assessment and Plan / UC Course  I have reviewed the triage vital signs and the nursing notes.  Pertinent labs & imaging results that were available during my care of the patient were reviewed by me and considered in my medical decision making (see chart for details).     Findings constistent with with OE. TM intact and without sign of infection. Course of cortisporin provided at  this time. Ibuprofen as needed for pain. Return precautions provided. Patient verbalized understanding and agreeable to plan.    Final Clinical Impressions(s) / UC Diagnoses   Final diagnoses:  Acute otitis externa of left ear, unspecified type     Discharge Instructions     Complete course of ear drops.  Ibuprofen as needed for pain.  If symptoms worsen or do not improve in the next week to return to be seen or to follow up with your PCP.     ED Prescriptions    Medication Sig Dispense Auth. Provider   neomycin-polymyxin-hydrocortisone (CORTISPORIN) 3.5-10000-1 OTIC suspension Place 3 drops into the left ear 4 (four) times daily for 7 days. 4.2 mL Augusto Gamble B, NP   ibuprofen (ADVIL,MOTRIN) 800 MG tablet Take 1 tablet (800 mg total) by mouth 3 (three) times daily. 21 tablet Zigmund Gottron, NP     Controlled Substance Prescriptions [place] Controlled Substance Registry consulted? Not Applicable   Zigmund Gottron, NP 10/31/17 1050

## 2017-10-31 NOTE — Progress Notes (Signed)
Radiation Oncology         [phone]  Multidisciplinary Brain and Spine Oncology Clinic Follow-Up Visit Note  CC: Velna Ochs, MD  Ashok Pall, MD  No diagnosis found.      Diagnosis:   52 y.o.  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 Monje is a pleasant 52 y.o. gentleman who was treated for a primary brain tumor with surgical resection followed by conventional radiotherapy for his grade 2 astrocytoma, with concurrent Temodar. He has chronic pain from a surgical dehiscence from his craniotomy and night time headaches.  He has been followed closely in the multidisciplinary clinic, and has been referred to Dr. [name] in the summer due to an interval MRI scan changes, concerning for progression in the summer of 2017. He met with her and she did not recommend treatment at that time. He has been followed with punctate foci along the previous resection and treatment site but subsequent disease surveillance scans have shown disease stability.  Recent brain MRI on 10/27/17 revealed stability in his surgical resection cavity as well as improvement in the enhancement in the corpus callosum, and in the right subinsular white matter was stable. He does have a left mastoid effusion. Of note he chooses to continue follow up with Dr. Johny Shears PAs rather than with neuro oncology. He also remains on chronic narcotic pain medication for headaches.   On review of systems, the patient reports that he is doing well overall. He states he was given medication for an otitis externa today by urgent care. He denies any headaches, visual or new auditory changes but his symptoms of left ear pain and pressure in the last few days. He denies any chest pain, shortness of breath, cough, fevers, chills, night sweats, unintended weight  changes. He denies any bowel or bladder disturbances, and denies abdominal pain, nausea or vomiting. He denies any new musculoskeletal or joint aches or pains, new skin lesions or concerns. A complete review of systems is obtained and is otherwise negative.   Past Medical History:  Past Medical History:  Diagnosis Date  . Astrocytoma of frontal lobe ([place]) 05/31/2013  . DM (diabetes mellitus) ([place]) 08/09/2011   A1c at diagnosis = 12.4  . Family history of breast cancer   . GERD (gastroesophageal reflux disease) 08/09/2011  . Seizure ([place]) 05/19/2013  . Shortness of breath     Past Surgical History: Past Surgical History:  Procedure Laterality Date  . APPENDECTOMY    . BRAIN BIOPSY N/A 05/24/2013   Procedure: Craniotomy for open biopsy;  Surgeon: Winfield Cunas, MD;  Location: [facility];  Service: Neurosurgery;  Laterality: N/A;  Craniotomy for open biopsy    Social History:  Social History   Socioeconomic History  . Marital status: Significant Other    Spouse name: Not on file  . Number of children: 2  . Years of education: Not on file  . Highest education level: Not on file  Occupational History    Employer: [employer]  Social Needs  . Financial resource strain: Not on file  . Food insecurity:    Worry: Not on file    Inability: Not on file  . Transportation needs:    Medical: Not on file    Non-medical: Not on file  Tobacco Use  . Smoking status: Current  Every Day Smoker    Packs/day: 1.00    Years: 33.00    Pack years: 33.00    Types: Cigarettes  . Smokeless tobacco: Never Used  . Tobacco comment: Trying to quit. Down to 1 ppd, 10/11/13 down to 1/2 ppd  Substance and Sexual Activity  . Alcohol use: Yes    Alcohol/week: 0.0 oz    Comment: social  . Drug use: Yes    Types: Marijuana    Comment: Marijuana daily  . Sexual activity: Not on file  Lifestyle  . Physical activity:    Days per week: Not on file    Minutes per session: Not on file  .  Stress: Not on file  Relationships  . Social connections:    Talks on phone: Not on file    Gets together: Not on file    Attends religious service: Not on file    Active member of club or organization: Not on file    Attends meetings of clubs or organizations: Not on file    Relationship status: Not on file  . Intimate partner violence:    Fear of current or ex partner: Not on file    Emotionally abused: Not on file    Physically abused: Not on file    Forced sexual activity: Not on file  Other Topics Concern  . Not on file  Social History Narrative   [place] the [place]. He does not work now.     Family History: Family History  Problem Relation Age of Onset  . Breast cancer Mother        dx in her 23s-60s  . Bone cancer Mother   . Diabetes Father   . Heart disease Father   . Diabetes Brother   . Diabetes Paternal Uncle     ALLERGIES:  has No Known Allergies.  Meds: Current Outpatient Medications  Medication Sig Dispense Refill  . metFORMIN (GLUCOPHAGE) 1000 MG tablet Take 1 tablet (1,000 mg total) by mouth 2 (two) times daily with a meal. 60 tablet 2  . methocarbamol (ROBAXIN) 500 MG tablet Take 1 tablet (500 mg total) by mouth 2 (two) times daily. (Patient not taking: Reported on 03/31/2017) 20 tablet 0  . naproxen (NAPROSYN) 500 MG tablet Take 1 tablet (500 mg total) by mouth 2 (two) times daily. (Patient not taking: Reported on 03/31/2017) 30 tablet 0  . nicotine polacrilex (NICORETTE) 4 MG gum Take 1 each (4 mg total) by mouth as needed for smoking cessation. 100 tablet 0  . [START ON 11/09/2017] oxyCODONE-acetaminophen (PERCOCET/ROXICET) 5-325 MG tablet Take 1 tablet by mouth at bedtime and may repeat dose one time if needed. 90 tablet 0  . phenytoin (DILANTIN) 100 MG ER capsule Take 3 capsules every morning 270 capsule 3   No current facility-administered medications for this encounter.     Physical Findings: Wt Readings from Last 3 Encounters:    07/27/17 187 lb 6.4 oz (85 kg)  06/27/17 188 lb 9.6 oz (85.5 kg)  03/31/17 185 lb 1.6 oz (84 kg)   Temp Readings from Last 3 Encounters:  07/27/17 97.8 F (36.6 C) (Oral)  06/27/17 98.5 F (36.9 C) (Oral)  03/31/17 97.6 F (36.4 C) (Oral)   BP Readings from Last 3 Encounters:  07/27/17 (!) 126/94  06/27/17 121/88  03/31/17 140/89   Pulse Readings from Last 3 Encounters:  07/27/17 (!) 106  06/27/17 (!) 116  03/31/17 86   In general this is a well  appearing caucasian male in no acute distress. He's alert and oriented x4 and appropriate throughout the examination. Cardiopulmonary assessment is negative for acute distress and he exhibits normal effort. No focal neurologic abnormalities are noted.  Lab Findings: Lab Results  Component Value Date   WBC 9.2 11/16/2013   HGB 16.1 11/16/2013   HCT 46.8 11/16/2013   MCV 91.1 11/16/2013   PLT 279 11/16/2013   Radiographic Findings: [name] NE Contrast  Result Date: 10/27/2017 CLINICAL DATA:  Grade 2 astrocytoma treated with resection, chemotherapy, and radiation EXAM: MRI HEAD WITHOUT AND WITH CONTRAST TECHNIQUE: Multiplanar, multiecho pulse sequences of the brain and surrounding structures were obtained without and with intravenous contrast. CONTRAST:  69m MULTIHANCE GADOBENATE DIMEGLUMINE 529 MG/ML IV SOLN COMPARISON:  MRI 07/25/2017, 03/29/2017 FINDINGS: Brain: Tumor resection cavity in the high right frontal lobe is unchanged. Surrounding white matter hyperintensity is stable. Chronic blood products in the surgical cavity stable. Mild hyperintensity in the contralateral left frontal white matter and throughout the corpus callosum are stable. Following contrast infusion, no recurrent enhancing tumor in the resection site. Small area of enhancement in the subinsular white matter on the right is unchanged. Slight enhancement in the corpus callosum and posterior interventricular septum appears slightly improved in likely related to  treatment effect. Negative for hydrocephalus or midline shift.  No acute infarct. Vascular: Normal arterial flow voids Skull and upper cervical spine: No acute skeletal abnormality. Sinuses/Orbits: Paranasal sinuses clear. Normal orbit. Mild left mastoid effusion Other: None IMPRESSION: Postop tumor treatment in the right frontal lobe without recurrence. Left frontal white matter hyperintensity also stable likely due to treatment. Improvement in small enhancing areas in the corpus callosum likely due to treatment. Small area of enhancement in the right subinsular white matter stable likely due to treatment. Electronically Signed   By: CFranchot GalloM.D.   On: 10/27/2017 13:54     Impression/Plan: 1. 52y.o. gentlelman with history of WHO grade II astrocytoma. The patient continues to be stable clinically as well as radiographically, and his films were reviewed this morning in brain conference. His resection cavity is stable and recommendations were to proceed with repeat imaging in 6 months time. He is in agreement and given a copy of his films.  2. Chronic headaches and skull pain. The patient continues to be managed well with ibuprofen and Percocet. He continues to have his prescriptions filled by my colleague Ashlyn Bruning, PAC and will do so long term.  3. Left mastoid effusion and left otitis externa. He will continue with topical antibiotic drops given today as well as consider OTC sudafed for the effusion. We will follow this expectantly.      ACarola Rhine PAC

## 2017-10-31 NOTE — ED Triage Notes (Signed)
Pt presents with left ear pain that has been ongoing for a few days with no relief with ear drops.

## 2017-11-24 MED FILL — OXYCODONE-ACETAMINOPHEN 5-3: 5-325 | 45 days supply | Qty: 90 | Fill #0

## 2017-12-01 ENCOUNTER — Encounter: Payer: Self-pay | Admitting: *Deleted

## 2017-12-19 ENCOUNTER — Encounter: Payer: Self-pay | Admitting: Internal Medicine

## 2018-01-09 ENCOUNTER — Other Ambulatory Visit: Payer: Self-pay | Admitting: Urology

## 2018-01-09 MED FILL — PHENYTOIN SOD EXT 100 MG CA: 100 | 90 days supply | Qty: 270 | Fill #3

## 2018-01-10 ENCOUNTER — Other Ambulatory Visit: Payer: Self-pay | Admitting: Urology

## 2018-01-10 ENCOUNTER — Telehealth: Payer: Self-pay | Admitting: *Deleted

## 2018-01-10 MED ORDER — OXYCODONE-ACETAMINOPHEN 5-325 MG PO TABS: 1.0000 | ORAL_TABLET | Freq: Every evening | ORAL | 0 refills | Status: DC | PRN

## 2018-01-10 MED FILL — OXYCODONE-ACETAMINOPHEN 5-3: 5-325 | 45 days supply | Qty: 90 | Fill #0

## 2018-01-11 NOTE — Telephone Encounter (Signed)
Error

## 2018-02-20 ENCOUNTER — Encounter: Payer: Self-pay | Admitting: Internal Medicine

## 2018-02-21 ENCOUNTER — Other Ambulatory Visit: Payer: Self-pay | Admitting: Urology

## 2018-02-21 ENCOUNTER — Encounter: Payer: Self-pay | Admitting: *Deleted

## 2018-02-21 MED ORDER — OXYCODONE-ACETAMINOPHEN 5-325 MG PO TABS: 1.0000 | ORAL_TABLET | Freq: Every evening | ORAL | 0 refills | Status: DC | PRN

## 2018-02-24 MED FILL — OXYCODONE-ACETAMINOPHEN 5-3: 5-325 | 45 days supply | Qty: 90 | Fill #0

## 2018-03-21 ENCOUNTER — Encounter: Payer: Self-pay | Admitting: Neurology

## 2018-03-21 ENCOUNTER — Ambulatory Visit (INDEPENDENT_AMBULATORY_CARE_PROVIDER_SITE_OTHER): Payer: Medicare Other | Admitting: Neurology

## 2018-03-21 ENCOUNTER — Other Ambulatory Visit: Payer: Self-pay

## 2018-03-21 DIAGNOSIS — G40109 Localization-related (focal) (partial) symptomatic epilepsy and epileptic syndromes with simple partial seizures, not intractable, without status epilepticus: Secondary | ICD-10-CM

## 2018-03-21 MED ORDER — PHENYTOIN SODIUM EXTENDED 100 MG PO CAPS: ORAL_CAPSULE | ORAL | 3 refills | Status: DC

## 2018-03-21 NOTE — Progress Notes (Signed)
NEUROLOGY FOLLOW UP OFFICE NOTE  [name] [id number] DOB: May 13, 1965  HISTORY OF PRESENT ILLNESS: I had the pleasure of seeing Jonathan Barker in follow-up in the neurology clinic on 03/21/2018. He is again accompanied by his daughter who helps supplement the history today. The patient was last seen a year ago for seizures secondary to bifrontal grade II astrocytoma. He continues to deny any seizures since January 2015. His daughter has not witnessed any seizures, no staring/unresponsive episodes. He denies any olfactory/gustatory hallucinations, focal numbness/tingling/weakness, myoclonic jerks, gaps in time. He has chronic headaches since brain biopsy and takes 1-2 Percocet daily. He denies any dizziness, vision changes, no falls. He has been taking Dilantin 300mg  daily with no side effects. Historically his Dilantin level has been low, however since he has been seizure-free, we have been monitoring him clinically. He was not interested in switching to a different seizure medication. He is driving.  I personally reviewed MRI brain with and without contrast done 10/27/17 which did not show any acute changes. Post-op tumor treatment in the right frontal lobe without recurrence, left frontal white matter hyperintensity also stable likely due to treatment. Improvement in small enhancing areas in the corpus callosum likely due to treatment. Small area of enhancement in the right subinsular white matter stable likely due to treatment  History on Initial Assessment 04/15/2015: This is a pleasant 52 yo RH man who had a new onset seizure last 05/18/13 and was found to have an infiltrative mass in the anteromedial right frontal lobe without enhancement, with FLAIR signal extending across the corpus callosum into the contralateral subcortical left frontal lobe. His daughter reports that he started blinking excessively, he recalls feeling his eyes "blinking at 100 mph," he tried to sit but could not  because his left hand was locked inside his pocket. He was witnessed to have generalized shaking for less than a minute. He reports that he recalls the entire event, he could hear people screaming around him. His daughter reports that he was coherent right after the seizure, no post-ictal confusion or focal weakness. He was brought to [facility] where imaging revealed the bifrontal mass, biopsy demonstrated diffuse grade 2 astrocytoma. Wake and drowsy EEG was normal. He took Temodar, then underwent radiotherapy until May 2015. He denies any further seizures since January, but does feel that he may have had a seizure the week after hospital discharge in January 2015, he woke up with his arms flexed, unsure if he had dreamed he had a seizure, no associated tongue bite or incontinence. His wife had mentioned around that time that she felt the bed shaking one night. Since January 2015, he denies any further eye blinking episodes, generalized shaking or stiffening of extremities, olfactory/gustatory hallucinations, deja vu, rising epigastric sensation, focal numbness/tingling/weakness, myoclonic jerks. He has had low Dilantin in levels in the past, and reports that he had only been taking it twice a day instead of TID, but with most recent level checked in November 2016 (<2.5), he reports taking Dilantin 100mg  TID. He denies any side effects to the medication.  He denies any prior history of headaches until brain biopsy, and since then he has been having frequent headaches. Headaches are over the frontal region, with pressure sensation, no associated nausea, vomiting, photo/phonophobia. He reports headaches occur at the end of the day, 3-4 times a week, with good response to Percocet. He usually takes the medication and goes to bed, with no headache on awakening. He notices more headaches with cold  weather. He denies any dizziness, vision changes, dysarthria/dysphagia, neck/back pain, focal numbness/tingling/weakness,  bowel/bladder dysfunction. He usually has 4 alcoholic drinks a month. He had a normal birth and early development. There is no history of febrile convulsions, CNS infections such as meningitis/encephalitis, significant traumatic brain injury, or family history of seizures.  I personally reviewed most recent MRI brain with and without contrast done 02/14/15 which showed abnormal FLAIR signal throughout the white matter of the frontal lobes, right more than left, unchanged, with small cystic area in the left frontal cortical/subcortical region that is noted to be slightly larger (63mm) compared to last year. There is a punctate focus of enhancement in the right frontal white matter, 73mm focus of enhancement in the right basal ganglia/external capsule that is new.   PAST MEDICAL HISTORY: Past Medical History:  Diagnosis Date  . Astrocytoma of frontal lobe ([place]) 05/31/2013  . DM (diabetes mellitus) ([place]) 08/09/2011   A1c at diagnosis = 12.4  . Family history of breast cancer   . GERD (gastroesophageal reflux disease) 08/09/2011  . Seizure ([place]) 05/19/2013  . Shortness of breath     MEDICATIONS:  Outpatient Encounter Medications as of 03/21/2018  Medication Sig  . ibuprofen (ADVIL,MOTRIN) 800 MG tablet Take 1 tablet (800 mg total) by mouth 3 (three) times daily.  . metFORMIN (GLUCOPHAGE) 1000 MG tablet Take 1 tablet (1,000 mg total) by mouth 2 (two) times daily with a meal.  . methocarbamol (ROBAXIN) 500 MG tablet Take 1 tablet (500 mg total) by mouth 2 (two) times daily. (Patient not taking: Reported on 10/31/2017)  . naproxen (NAPROSYN) 500 MG tablet Take 1 tablet (500 mg total) by mouth 2 (two) times daily. (Patient not taking: Reported on 10/31/2017)  . nicotine polacrilex (NICORETTE) 4 MG gum Take 1 each (4 mg total) by mouth as needed for smoking cessation.  Marland Kitchen oxyCODONE-acetaminophen (PERCOCET/ROXICET) 5-325 MG tablet Take 1 tablet by mouth at bedtime and may repeat dose one time if needed.  .  phenytoin (DILANTIN) 100 MG ER capsule Take 3 capsules every morning   No facility-administered encounter medications on file as of 03/21/2018.     ALLERGIES: No Known Allergies  FAMILY HISTORY: Family History  Problem Relation Age of Onset  . Breast cancer Mother        dx in her 35s-60s  . Bone cancer Mother   . Diabetes Father   . Heart disease Father   . Diabetes Brother   . Diabetes Paternal Uncle     SOCIAL HISTORY: Social History   Socioeconomic History  . Marital status: Significant Other    Spouse name: Not on file  . Number of children: 2  . Years of education: Not on file  . Highest education level: Not on file  Occupational History    Employer: [employer]  Social Needs  . Financial resource strain: Not on file  . Food insecurity:    Worry: Not on file    Inability: Not on file  . Transportation needs:    Medical: Not on file    Non-medical: Not on file  Tobacco Use  . Smoking status: Current Every Day Smoker    Packs/day: 1.00    Years: 33.00    Pack years: 33.00    Types: Cigarettes  . Smokeless tobacco: Never Used  . Tobacco comment: Trying to quit. Down to 1 ppd, 10/11/13 down to 1/2 ppd  Substance and Sexual Activity  . Alcohol use: Yes    Alcohol/week: 0.0  standard drinks    Comment: social  . Drug use: Yes    Types: Marijuana    Comment: Marijuana daily  . Sexual activity: Not on file  Lifestyle  . Physical activity:    Days per week: Not on file    Minutes per session: Not on file  . Stress: Not on file  Relationships  . Social connections:    Talks on phone: Not on file    Gets together: Not on file    Attends religious service: Not on file    Active member of club or organization: Not on file    Attends meetings of clubs or organizations: Not on file    Relationship status: Not on file  . Intimate partner violence:    Fear of current or ex partner: Not on file    Emotionally abused: Not on file    Physically  abused: Not on file    Forced sexual activity: Not on file  Other Topics Concern  . Not on file  Social History Narrative   [place] the [place]. He does not work now.     REVIEW OF SYSTEMS: Constitutional: No fevers, chills, or sweats, no generalized fatigue, change in appetite Eyes: No visual changes, double vision, eye pain Ear, nose and throat: No hearing loss, ear pain, nasal congestion, sore throat Cardiovascular: No chest pain, palpitations Respiratory:  No shortness of breath at rest or with exertion, wheezes GastrointestinaI: No nausea, vomiting, diarrhea, abdominal pain, fecal incontinence Genitourinary:  No dysuria, urinary retention or frequency Musculoskeletal:  No neck pain, back pain Integumentary: No rash, pruritus, skin lesions Neurological: as above Psychiatric: No depression, insomnia, anxiety Endocrine: No palpitations, fatigue, diaphoresis, mood swings, change in appetite, change in weight, increased thirst Hematologic/Lymphatic:  No anemia, purpura, petechiae. Allergic/Immunologic: no itchy/runny eyes, nasal congestion, recent allergic reactions, rashes  PHYSICAL EXAM: Vitals:   03/21/18 1505  BP: 122/80  Pulse: (!) 104  SpO2: 96%   General: No acute distress Head:  Normocephalic/atraumatic Neck: supple, no paraspinal tenderness, full range of motion Heart:  Regular rate and rhythm Lungs:  Clear to auscultation bilaterally Back: No paraspinal tenderness Skin/Extremities: No rash, no edema Neurological Exam: alert and oriented to person, place, and time. No aphasia or dysarthria. Fund of knowledge is appropriate.  Remote memory intact. Attention and concentration are normal.    Able to name objects and repeat phrases. Cranial nerves: Pupils equal, round, reactive to light. Extraocular movements intact with no nystagmus. Visual fields full. Facial sensation intact. No facial asymmetry. Tongue, uvula, palate midline.  Motor: Bulk and tone normal,  muscle strength 5/5 throughout with no pronator drift.  Sensation to light touch intact.  No extinction to double simultaneous stimulation. Finger to nose testing intact.  Gait narrow-based and steady, able to tandem walk adequately.  Romberg negative.  IMPRESSION: This is a pleasant 52 yo RH man with a history of new onset seizure last January 2015 during which he was found to have a bifrontal (right greater than left) grade II astrocytoma. He underwent Temodar treatment and radiotherapy. Last MRI brain in June 2019 did not show any acute changes. No new areas of enhancement seen. He and his daughter deny any seizures since 2015. He is on Dilantin 300mg  daily, historically Dilantin levels have been low, he and his daughter ensure compliance. He is not interested in switching medications, and since seizure-free, we have agreed to continue current dose of Dilantin without any changes and monitor clinically. He  is aware of [place] driving laws to stop driving after a seizure, until 6 months seizure-free. He will follow-up in 1 year and knows to call for any changes.  Thank you for allowing me to participate in his care.  Please do not hesitate to call for any questions or concerns.  The duration of this appointment visit was 20 minutes of face-to-face time with the patient.  Greater than 50% of this time was spent in counseling, explanation of diagnosis, planning of further management, and coordination of care.   Ellouise Newer, M.D.   CC: Dr. Philipp Ovens, Shona Simpson, PA-C

## 2018-03-21 NOTE — Patient Instructions (Signed)
Great seeing you! Continue Dilantin 100mg : take 3 caps daily. Follow-up in 1 year, call for any changes.  Seizure Precautions: 1. If medication has been prescribed for you to prevent seizures, take it exactly as directed.  Do not stop taking the medicine without talking to your doctor first, even if you have not had a seizure in a long time.   2. Avoid activities in which a seizure would cause danger to yourself or to others.  Don't operate dangerous machinery, swim alone, or climb in high or dangerous places, such as on ladders, roofs, or girders.  Do not drive unless your doctor says you may.  3. If you have any warning that you may have a seizure, lay down in a safe place where you can't hurt yourself.    4.  No driving for 6 months from last seizure, as per [facility].   Please refer to the following link on the [facility] website for more information: http://[email or web address].org/answerplace/[email or web address]/driving/drivingu.cfm   5.  Maintain good sleep hygiene. Avoid alcohol.  6.  Contact your doctor if you have any problems that may be related to the medicine you are taking.  7.  Call 911 and bring the patient back to the ED if:        A.  The seizure lasts longer than 5 minutes.       B.  The patient doesn't awaken shortly after the seizure  C.  The patient has new problems such as difficulty seeing, speaking or moving  D.  The patient was injured during the seizure  E.  The patient has a temperature over 102 F (39C)  F.  The patient vomited and now is having trouble breathing

## 2018-03-22 ENCOUNTER — Encounter: Payer: Self-pay | Admitting: Neurology

## 2018-03-24 ENCOUNTER — Other Ambulatory Visit: Payer: Self-pay | Admitting: *Deleted

## 2018-03-24 DIAGNOSIS — D4989 Neoplasm of unspecified behavior of other specified sites: Secondary | ICD-10-CM

## 2018-03-27 ENCOUNTER — Encounter: Payer: Self-pay | Admitting: Internal Medicine

## 2018-03-27 ENCOUNTER — Other Ambulatory Visit: Payer: Self-pay

## 2018-03-27 ENCOUNTER — Ambulatory Visit (INDEPENDENT_AMBULATORY_CARE_PROVIDER_SITE_OTHER): Payer: Medicare Other | Admitting: Internal Medicine

## 2018-03-27 VITALS — BP 129/93 | HR 94 | Temp 97.9°F | Wt 184.5 lb

## 2018-03-27 DIAGNOSIS — E119 Type 2 diabetes mellitus without complications: Secondary | ICD-10-CM | POA: Diagnosis not present

## 2018-03-27 DIAGNOSIS — Z720 Tobacco use: Secondary | ICD-10-CM

## 2018-03-27 DIAGNOSIS — E118 Type 2 diabetes mellitus with unspecified complications: Secondary | ICD-10-CM

## 2018-03-27 DIAGNOSIS — Z8669 Personal history of other diseases of the nervous system and sense organs: Secondary | ICD-10-CM

## 2018-03-27 LAB — POCT GLYCOSYLATED HEMOGLOBIN (HGB A1C): HEMOGLOBIN A1C: 10.7 % — AB (ref 4.0–5.6)

## 2018-03-27 LAB — GLUCOSE, CAPILLARY: Glucose-Capillary: 254 mg/dL — ABNORMAL HIGH (ref 70–99)

## 2018-03-27 MED ORDER — VARENICLINE TARTRATE 0.5 MG X 11 & 1 MG X 42 PO MISC: ORAL | 0 refills | Status: DC

## 2018-03-27 MED ORDER — EMPAGLIFLOZIN-METFORMIN HCL 12.5-1000 MG PO TABS: 1.0000 | ORAL_TABLET | Freq: Two times a day (BID) | ORAL | 2 refills | Status: DC

## 2018-03-27 MED FILL — SYNJARDY TB 12.5MG/1000MG: 12.5-1000 | 30 days supply | Qty: 60 | Fill #0

## 2018-03-27 NOTE — Progress Notes (Signed)
   CC: Diabetes  HPI:  [name] is a 52 y.o. male with past medical history outlined below here for diabetes. For the details of today's visit, please refer to the assessment and plan.  Past Medical History:  Diagnosis Date  . Astrocytoma of frontal lobe ([place]) 05/31/2013  . DM (diabetes mellitus) ([place]) 08/09/2011   A1c at diagnosis = 12.4  . Family history of breast cancer   . GERD (gastroesophageal reflux disease) 08/09/2011  . Seizure ([place]) 05/19/2013  . Shortness of breath     Review of Systems  Neurological: Negative for seizures and headaches.    Physical Exam:  Vitals:   03/27/18 1404  BP: (!) 129/93  Pulse: 94  Temp: 97.9 F (36.6 C)  TempSrc: Oral  Weight: 184 lb 8 oz (83.7 kg)    Constitutional: NAD, appears comfortable Cardiovascular: RRR, no murmurs, rubs, or gallops.  Pulmonary/Chest: CTAB, no wheezes, rales, or rhonchi.  Neurological: A&Ox3, CN II - XII grossly intact.  Psychiatric: Normal mood and affect  Assessment & Plan:   See Encounters Tab for problem based charting.  Patient discussed with Dr. Rebeca Alert

## 2018-03-27 NOTE — Patient Instructions (Signed)
Jonathan Barker,  It was a pleasure to see you. Please stop taking metformin, and start taking the combination pill empagliflozin-metformin twice daily. I have also given you a prescription for chantix to help you quit smoking. Follow up with me again in 3 months. If you have any questions or concerns, call our clinic at [phone] or after hours call [phone] and ask for the internal medicine resident on call. Thank you!  Dr. Philipp Ovens   Diabetes Mellitus and Nutrition When you have diabetes (diabetes mellitus), it is very important to have healthy eating habits because your blood sugar (glucose) levels are greatly affected by what you eat and drink. Eating healthy foods in the appropriate amounts, at about the same times every day, can help you:  Control your blood glucose.  Lower your risk of heart disease.  Improve your blood pressure.  Reach or maintain a healthy weight.  Every person with diabetes is different, and each person has different needs for a meal plan. Your health care provider may recommend that you work with a diet and nutrition specialist (dietitian) to make a meal plan that is best for you. Your meal plan may vary depending on factors such as:  The calories you need.  The medicines you take.  Your weight.  Your blood glucose, blood pressure, and cholesterol levels.  Your activity level.  Other health conditions you have, such as heart or kidney disease.  How do carbohydrates affect me? Carbohydrates affect your blood glucose level more than any other type of food. Eating carbohydrates naturally increases the amount of glucose in your blood. Carbohydrate counting is a method for keeping track of how many carbohydrates you eat. Counting carbohydrates is important to keep your blood glucose at a healthy level, especially if you use insulin or take certain oral diabetes medicines. It is important to know how many carbohydrates you can safely have in each meal.  This is different for every person. Your dietitian can help you calculate how many carbohydrates you should have at each meal and for snack. Foods that contain carbohydrates include:  Bread, cereal, rice, pasta, and crackers.  Potatoes and corn.  Peas, beans, and lentils.  Milk and yogurt.  Fruit and juice.  Desserts, such as cakes, cookies, ice cream, and candy.  How does alcohol affect me? Alcohol can cause a sudden decrease in blood glucose (hypoglycemia), especially if you use insulin or take certain oral diabetes medicines. Hypoglycemia can be a life-threatening condition. Symptoms of hypoglycemia (sleepiness, dizziness, and confusion) are similar to symptoms of having too much alcohol. If your health care provider says that alcohol is safe for you, follow these guidelines:  Limit alcohol intake to no more than 1 drink per day for nonpregnant women and 2 drinks per day for men. One drink equals 12 oz of beer, 5 oz of wine, or 1 oz of hard liquor.  Do not drink on an empty stomach.  Keep yourself hydrated with water, diet soda, or unsweetened iced tea.  Keep in mind that regular soda, juice, and other mixers may contain a lot of sugar and must be counted as carbohydrates.  What are tips for following this plan? Reading food labels  Start by checking the serving size on the label. The amount of calories, carbohydrates, fats, and other nutrients listed on the label are based on one serving of the food. Many foods contain more than one serving per package.  Check the total grams (g) of carbohydrates in one serving. You  can calculate the number of servings of carbohydrates in one serving by dividing the total carbohydrates by 15. For example, if a food has 30 g of total carbohydrates, it would be equal to 2 servings of carbohydrates.  Check the number of grams (g) of saturated and trans fats in one serving. Choose foods that have low or no amount of these fats.  Check the number  of milligrams (mg) of sodium in one serving. Most people should limit total sodium intake to less than 2,300 mg per day.  Always check the nutrition information of foods labeled as "low-fat" or "nonfat". These foods may be higher in added sugar or refined carbohydrates and should be avoided.  Talk to your dietitian to identify your daily goals for nutrients listed on the label. Shopping  Avoid buying canned, premade, or processed foods. These foods tend to be high in fat, sodium, and added sugar.  Shop around the outside edge of the grocery store. This includes fresh fruits and vegetables, bulk grains, fresh meats, and fresh dairy. Cooking  Use low-heat cooking methods, such as baking, instead of high-heat cooking methods like deep frying.  Cook using healthy oils, such as olive, canola, or sunflower oil.  Avoid cooking with butter, cream, or high-fat meats. Meal planning  Eat meals and snacks regularly, preferably at the same times every day. Avoid going long periods of time without eating.  Eat foods high in fiber, such as fresh fruits, vegetables, beans, and whole grains. Talk to your dietitian about how many servings of carbohydrates you can eat at each meal.  Eat 4-6 ounces of lean protein each day, such as lean meat, chicken, fish, eggs, or tofu. 1 ounce is equal to 1 ounce of meat, chicken, or fish, 1 egg, or 1/4 cup of tofu.  Eat some foods each day that contain healthy fats, such as avocado, nuts, seeds, and fish. Lifestyle   Check your blood glucose regularly.  Exercise at least 30 minutes 5 or more days each week, or as told by your health care provider.  Take medicines as told by your health care provider.  Do not use any products that contain nicotine or tobacco, such as cigarettes and e-cigarettes. If you need help quitting, ask your health care provider.  Work with a Social worker or diabetes educator to identify strategies to manage stress and any emotional and social  challenges. What are some questions to ask my health care provider?  Do I need to meet with a diabetes educator?  Do I need to meet with a dietitian?  What number can I call if I have questions?  When are the best times to check my blood glucose? Where to find more information:  American Diabetes Association: diabetes.org/food-and-fitness/food  Academy of Nutrition and Dietetics: PokerClues.dk  [employer] of Diabetes and Digestive and Kidney Diseases (NIH): ContactWire.be Summary  A healthy meal plan will help you control your blood glucose and maintain a healthy lifestyle.  Working with a diet and nutrition specialist (dietitian) can help you make a meal plan that is best for you.  Keep in mind that carbohydrates and alcohol have immediate effects on your blood glucose levels. It is important to count carbohydrates and to use alcohol carefully. This information is not intended to replace advice given to you by your health care provider. Make sure you discuss any questions you have with your health care provider. Document Released: 01/14/2005 Document Revised: 05/24/2016 Document Reviewed: 05/24/2016 Elsevier Interactive Patient Education  2018 Elsevier  Inc.  

## 2018-03-27 NOTE — Assessment & Plan Note (Signed)
Failed cessation attempt with nicotine gum. Would like to try chantix. Not a candidate for Wellbutrin given seizure history.  -- Prescribed chantix starter pack

## 2018-03-27 NOTE — Assessment & Plan Note (Signed)
Patient is here for diabetes follow up. He is uncontrolled, A1c today is 10.7. He did not increase metformin to BID as we discussed at his last visit. Continues to have a very poor diet, high in sugar and carbohydrates. He eats a bagel every morning for breakfast and fruit throughout the day. Today we discussed a low carb diet; I advised limiting bagels and soda. He has stopped working since his last visit. He is motivated to change his diet and start exercising more. Recently adopted a new dog, plans to try and walk the dog more during the day. He is agreeable to adding a second agent today. Will transition to empagliflozin-metformin BID. Follow up 3 months. -- Discussed diet & lifestyle changes; declined referral to nutritionist  -- Change metformin to empagliflozin-metformin 12.08-998 mg BID -- Follow up 3 months

## 2018-03-29 NOTE — Progress Notes (Signed)
Internal Medicine Clinic Attending  Case discussed with Dr. Guilloud at the time of the visit.  We reviewed the resident's history and exam and pertinent patient test results.  I agree with the assessment, diagnosis, and plan of care documented in the resident's note.  Alexander Raines, M.D., Ph.D.  

## 2018-04-11 ENCOUNTER — Telehealth: Payer: Self-pay | Admitting: Radiation Oncology

## 2018-04-11 ENCOUNTER — Other Ambulatory Visit: Payer: Self-pay | Admitting: Urology

## 2018-04-11 DIAGNOSIS — E119 Type 2 diabetes mellitus without complications: Secondary | ICD-10-CM

## 2018-04-11 MED ORDER — OXYCODONE-ACETAMINOPHEN 5-325 MG PO TABS: 1.0000 | ORAL_TABLET | Freq: Every evening | ORAL | 0 refills | Status: DC | PRN

## 2018-04-11 MED FILL — OXYCODONE-ACETAMINOPHEN 5-3: 5-325 | 45 days supply | Qty: 90 | Fill #0

## 2018-04-11 MED FILL — PHENYTOIN SODIUM EXTENDED 1: 100 | 90 days supply | Qty: 270 | Fill #0

## 2018-04-11 NOTE — Telephone Encounter (Signed)
-----   Message from [name], [place] sent at 04/11/2018 11:53 AM EST ----- Regarding: RE: Medication refill request Done.  Rx sent to [facility] outpatient pharmacy and should be ready for pickup later today.  Please let patient know. Thank you! -Ashlyn ----- Message ----- From: Heywood Footman, RN Sent: 04/11/2018  11:44 AM EST To: [name], PA-C Subject: Medication refill request                      Ashlyn.  Patient phoned today requesting a refill of his oxycodone-acetaminophen. You filled this last on 02/21/2018. Thank you.  [name]

## 2018-04-11 NOTE — Telephone Encounter (Signed)
Phoned patient and explained that Ashlyn has escribed his RX and it should be ready for pick up later today. Patient verbalized understanding and expressed appreciation for the call.

## 2018-04-28 ENCOUNTER — Ambulatory Visit
Admission: RE | Admit: 2018-04-28 | Discharge: 2018-04-28 | Disposition: A | Discharge status: Home / Self Care | Payer: Medicare Other | Source: Ambulatory Visit | Attending: Radiation Oncology | Admitting: Radiation Oncology

## 2018-04-28 DIAGNOSIS — D4989 Neoplasm of unspecified behavior of other specified sites: Secondary | ICD-10-CM

## 2018-04-28 DIAGNOSIS — C719 Malignant neoplasm of brain, unspecified: Secondary | ICD-10-CM | POA: Diagnosis not present

## 2018-04-28 MED ORDER — GADOBENATE DIMEGLUMINE 529 MG/ML IV SOLN
17.0000 mL | Freq: Once | INTRAVENOUS | Status: AC | PRN
  Administered 2018-04-28: 17 mL via INTRAVENOUS

## 2018-05-02 ENCOUNTER — Ambulatory Visit
Admission: RE | Admit: 2018-05-02 | Discharge: 2018-05-02 | Disposition: A | Discharge status: Home / Self Care | Payer: Medicare Other | Source: Ambulatory Visit | Attending: Urology | Admitting: Urology

## 2018-05-02 ENCOUNTER — Encounter: Payer: Self-pay | Admitting: Urology

## 2018-05-02 ENCOUNTER — Other Ambulatory Visit: Payer: Self-pay

## 2018-05-02 VITALS — BP 133/92 | HR 100 | Temp 97.8°F | Resp 18 | Wt 182.0 lb

## 2018-05-02 DIAGNOSIS — R51 Headache: Secondary | ICD-10-CM | POA: Diagnosis not present

## 2018-05-02 DIAGNOSIS — C719 Malignant neoplasm of brain, unspecified: Secondary | ICD-10-CM

## 2018-05-02 DIAGNOSIS — G8929 Other chronic pain: Secondary | ICD-10-CM | POA: Diagnosis not present

## 2018-05-02 DIAGNOSIS — C711 Malignant neoplasm of frontal lobe: Secondary | ICD-10-CM | POA: Diagnosis not present

## 2018-05-02 DIAGNOSIS — F17210 Nicotine dependence, cigarettes, uncomplicated: Secondary | ICD-10-CM | POA: Diagnosis not present

## 2018-05-02 DIAGNOSIS — Z08 Encounter for follow-up examination after completed treatment for malignant neoplasm: Secondary | ICD-10-CM | POA: Diagnosis not present

## 2018-05-02 DIAGNOSIS — Z79891 Long term (current) use of opiate analgesic: Secondary | ICD-10-CM | POA: Diagnosis not present

## 2018-05-02 NOTE — Progress Notes (Signed)
Radiation Oncology         [phone]  Multidisciplinary Brain and Spine Oncology Clinic Follow-Up Visit Note  CC: Velna Ochs, MD  Ashok Pall, MD    ICD-10-CM   1. Astrocytoma brain tumor (HCC) C71.9         Diagnosis:   52 y.o.  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 Preble is a pleasant 52 y.o. gentleman who was treated for a primary brain tumor with surgical resection followed by conventional radiotherapy for his grade 2 astrocytoma, with concurrent Temodar. He has chronic pain from a surgical dehiscence from his craniotomy and night time headaches.  He has been followed closely in the multidisciplinary clinic, and has been referred to Dr. [name] in the summer due to an interval MRI scan change, concerning for progression in the summer of 2017. He met with her and she did not recommend treatment at that time. He has been followed with punctate foci along the previous resection and treatment site but subsequent disease surveillance scans have shown disease stability.  Recent brain MRI on 04/28/18 revealed stability in his surgical resection cavity as well as in the corpus callosum, and in the right subinsular white matter.  There was a new small focus of diffusion normal signal in the left frontal white matter, felt most likely a subacute lacunar infarct.  No evidence of increasing T2 signal or enhancement in the areas of treated glioma.  Of note he chooses to continue follow up with Dr. Johny Shears PAs rather than with neuro oncology. He also remains on chronic narcotic pain medication for headaches.   On review of systems, the patient reports that he is doing well overall.  He denies any change in frequency or intensity of headaches, new visual or auditory changes or imbalance. He denies any  chest pain, shortness of breath, cough, fevers, chills, night sweats, unintended weight changes. He denies any bowel or bladder disturbances, and denies abdominal pain, nausea or vomiting. He denies any new musculoskeletal or joint aches or pains, new skin lesions or concerns. A complete review of systems is obtained and is otherwise negative.   Past Medical History:  Past Medical History:  Diagnosis Date  . Astrocytoma of frontal lobe ([place]) 05/31/2013  . DM (diabetes mellitus) ([place]) 08/09/2011   A1c at diagnosis = 12.4  . Family history of breast cancer   . GERD (gastroesophageal reflux disease) 08/09/2011  . Seizure ([place]) 05/19/2013  . Shortness of breath     Past Surgical History: Past Surgical History:  Procedure Laterality Date  . APPENDECTOMY    . BRAIN BIOPSY N/A 05/24/2013   Procedure: Craniotomy for open biopsy;  Surgeon: Winfield Cunas, MD;  Location: [place] [facility];  Service: Neurosurgery;  Laterality: N/A;  Craniotomy for open biopsy    Social History:  Social History   Socioeconomic History  . Marital status: Significant Other    Spouse name: Not on file  . Number of children: 2  . Years of education: Not on file  . Highest education level: Not on file  Occupational History    Employer: [employer]  Social Needs  . Financial resource strain: Not on file  . Food insecurity:    Worry: Not on file    Inability: Not on file  . Transportation needs:  Medical: Not on file    Non-medical: Not on file  Tobacco Use  . Smoking status: Current Every Day Smoker    Packs/day: 1.00    Years: 33.00    Pack years: 33.00    Types: Cigarettes  . Smokeless tobacco: Never Used  . Tobacco comment: Trying to quit. Down to 1 ppd, 10/11/13 down to 1/2 ppd  Substance and Sexual Activity  . Alcohol use: Yes    Alcohol/week: 0.0 standard drinks    Comment: social  . Drug use: Yes    Types: Marijuana    Comment: Marijuana daily  . Sexual activity: Not on file    Lifestyle  . Physical activity:    Days per week: Not on file    Minutes per session: Not on file  . Stress: Not on file  Relationships  . Social connections:    Talks on phone: Not on file    Gets together: Not on file    Attends religious service: Not on file    Active member of club or organization: Not on file    Attends meetings of clubs or organizations: Not on file    Relationship status: Not on file  . Intimate partner violence:    Fear of current or ex partner: Not on file    Emotionally abused: Not on file    Physically abused: Not on file    Forced sexual activity: Not on file  Other Topics Concern  . Not on file  Social History Narrative   [place]. He does not work now.     Family History: Family History  Problem Relation Age of Onset  . Breast cancer Mother        dx in her 29s-60s  . Bone cancer Mother   . Diabetes Father   . Heart disease Father   . Diabetes Brother   . Diabetes Paternal Uncle     ALLERGIES:  has No Known Allergies.  Meds: Current Outpatient Medications  Medication Sig Dispense Refill  . Empagliflozin-metFORMIN HCl 12.08-998 MG TABS Take 1 tablet by mouth 2 (two) times daily. 60 tablet 2  . nicotine polacrilex (NICORETTE) 4 MG gum Take 1 each (4 mg total) by mouth as needed for smoking cessation. 100 tablet 0  . oxyCODONE-acetaminophen (PERCOCET/ROXICET) 5-325 MG tablet Take 1 tablet by mouth at bedtime and may repeat dose one time if needed. 90 tablet 0  . phenytoin (DILANTIN) 100 MG ER capsule Take 3 capsules every morning 270 capsule 3  . ibuprofen (ADVIL,MOTRIN) 800 MG tablet Take 1 tablet (800 mg total) by mouth 3 (three) times daily. (Patient not taking: Reported on 05/02/2018) 21 tablet 0  . varenicline (CHANTIX STARTING MONTH PAK) 0.5 MG X 11 & 1 MG X 42 tablet Take one 0.5 mg tab once daily for 3 days, then increase to one 0.5 mg tab twice daily for 4 days, then increase to one 1 mg tab twice daily  (Patient not taking: Reported on 05/02/2018) 53 tablet 0   No current facility-administered medications for this encounter.     Physical Findings: Wt Readings from Last 3 Encounters:  05/02/18 182 lb (82.6 kg)  03/27/18 184 lb 8 oz (83.7 kg)  03/21/18 183 lb (83 kg)   Temp Readings from Last 3 Encounters:  05/02/18 97.8 F (36.6 C) (Oral)  03/27/18 97.9 F (36.6 C) (Oral)  10/31/17 98 F (36.7 C) (Oral)   BP Readings from Last 3 Encounters:  05/02/18 (!) 133/92  03/27/18 (!) 129/93  03/21/18 122/80   Pulse Readings from Last 3 Encounters:  05/02/18 100  03/27/18 94  03/21/18 (!) 104   In general this is a well appearing caucasian male in no acute distress. He's alert and oriented x4 and appropriate throughout the examination. Cardiopulmonary assessment is negative for acute distress and he exhibits normal effort. No focal neurologic abnormalities are noted.  Lab Findings: Lab Results  Component Value Date   WBC 9.2 11/16/2013   HGB 16.1 11/16/2013   HCT 46.8 11/16/2013   MCV 91.1 11/16/2013   PLT 279 11/16/2013   Radiographic Findings: [name] KX Contrast  Result Date: 04/28/2018 CLINICAL DATA:  CNS staging. Grade 2 astrocytoma treated with resection, chemotherapy, and radiotherapy EXAM: MRI HEAD WITHOUT AND WITH CONTRAST TECHNIQUE: Multiplanar, multiecho pulse sequences of the brain and surrounding structures were obtained without and with intravenous contrast. CONTRAST:  10m MULTIHANCE GADOBENATE DIMEGLUMINE 529 MG/ML IV SOLN COMPARISON:  10/27/2017 FINDINGS: Brain: Subtle smudgy enhancement in the subinsular white matter on the right has decreased in conspicuity although stable in size. Punctate foci of enhancement in the subcortical high left frontal white matter is no longer seen. Punctate focus of enhancement in the right pons is stable and likely vascular-there is a focus of susceptibility artifact in the same region. Postcontrast imaging is degraded by motion  artifact. T2 signal in the right more than left frontal lobes is unchanged in extent, with stable cystic change in the high anterior right frontal lobe and lacunes about the corpus callosum with traversing wallerian changes. There is no increasing mass effect. No new foci of enhancement. There is a small nodular focus of DWI hyperintensity in the left frontal white matter adjacent to the corpus callosum, with punctate chronic blood products by SWI, probably a subacute ischemic focus. There are remote hemorrhagic foci in the right periatrial white matter and medial left thalamus, question an element of chronic hypertension as well. No hemorrhage, hydrocephalus, or collection. Vascular: Major flow voids and vascular enhancements are preserved Skull and upper cervical spine: Stable appearance of right frontal craniotomy Sinuses/Orbits: Negative IMPRESSION: 1. New small focus of diffusion signal in the left frontal white matter, likely a subacute lacunar infarct. 2. No increasing T2 signal or enhancement in the areas of treated glioma. Electronically Signed   By: JMonte FantasiaM.D.   On: 04/28/2018 11:32     Impression/Plan: 1. 52y.o. gentlelman with history of WHO grade II astrocytoma. The patient continues to be stable clinically as well as radiographically, and his films were reviewed on 05/01/18 in brain conference. His resection cavity is stable and recommendations were to proceed with repeat imaging in 6 months time. He is in agreement and given a copy of his report.  2. Chronic headaches and skull pain. The patient continues to be managed well with Advil and Percocet prn, using these sparingly, only for severe pain. He understands that his prescription will only be filled every 45 days.    ANicholos Johns MMS, PA-C [place] [phone] Fax: [phone]

## 2018-05-02 NOTE — Progress Notes (Signed)
Mr. Westcott is here for a follow-up appointment today. He had radiation from 06/18/13 to 09/03/2013 . Mr. Oleson has a history of grade II astrocytoma .  Recent neurologic symptoms, if any:   Seizures: None  Headaches:  Yes  Nausea: No  Dizziness/ataxia: No  Difficulty with hand coordination: No   Focal numbness/weakness: No  Visual deficits/changes: No  Confusion/Memory deficits:No Vitals:   05/02/18 1401  BP: (!) 133/92  Pulse: 100  Resp: 18  Temp: 97.8 F (36.6 C)  TempSrc: Oral  SpO2: 97%  Weight: 182 lb (82.6 kg)    Wt Readings from Last 3 Encounters:  05/02/18 182 lb (82.6 kg)  03/27/18 184 lb 8 oz (83.7 kg)  03/21/18 183 lb (83 kg)

## 2018-05-02 NOTE — Addendum Note (Signed)
Encounter addended by: [name], PA-C on: 05/02/2018 4:20 PM  Actions taken: LOS modified

## 2018-05-23 ENCOUNTER — Other Ambulatory Visit: Payer: Self-pay | Admitting: Urology

## 2018-05-23 MED ORDER — OXYCODONE-ACETAMINOPHEN 5-325 MG PO TABS: 1.0000 | ORAL_TABLET | Freq: Every evening | ORAL | 0 refills | Status: DC | PRN

## 2018-05-23 MED FILL — OXYCODONE-ACETAMINOPHEN 5-3: 5-325 | 45 days supply | Qty: 90 | Fill #0

## 2018-05-24 ENCOUNTER — Telehealth: Payer: Self-pay | Admitting: Radiation Oncology

## 2018-05-24 NOTE — Telephone Encounter (Signed)
-----   Message from [name], [place] sent at 05/23/2018  4:04 PM EST ----- Regarding: RE: Second call for Percocet refill Sorry!  The initial request was sent yesterday when I was off and today has been so crazy busy I am just getting around to checking my inbox!  :0  Please let him know the Rx has been sent and should be available for pick up today. Thanks! -Ashlyn ----- Message ----- From: Heywood Footman, RN Sent: 05/23/2018   3:36 PM EST To: [name], PA-C Subject: Second call for Percocet refill                Patient called again today. He states, "my script hasn't been called in yet." Just a friendly reminders. So sorry.   [name]

## 2018-05-24 NOTE — Telephone Encounter (Signed)
Phoned patient as requested by [name], PA-C and informed him that his Percocet script has been escribed to [facility]. Patient verbalized understanding.

## 2018-06-21 ENCOUNTER — Other Ambulatory Visit: Payer: Self-pay

## 2018-06-21 ENCOUNTER — Emergency Department (HOSPITAL_COMMUNITY)
Admission: EM | Admit: 2018-06-21 | Discharge: 2018-06-22 | Disposition: A | Discharge status: Home / Self Care | Payer: Medicare Other | Attending: Emergency Medicine | Admitting: Emergency Medicine

## 2018-06-21 ENCOUNTER — Emergency Department (HOSPITAL_COMMUNITY): Payer: Medicare Other

## 2018-06-21 ENCOUNTER — Encounter (HOSPITAL_COMMUNITY): Payer: Self-pay | Admitting: Emergency Medicine

## 2018-06-21 DIAGNOSIS — Z5321 Procedure and treatment not carried out due to patient leaving prior to being seen by health care provider: Secondary | ICD-10-CM | POA: Diagnosis not present

## 2018-06-21 DIAGNOSIS — R05 Cough: Secondary | ICD-10-CM | POA: Diagnosis not present

## 2018-06-21 DIAGNOSIS — R0789 Other chest pain: Secondary | ICD-10-CM | POA: Insufficient documentation

## 2018-06-21 NOTE — ED Triage Notes (Signed)
Patient presents with a cough that began Friday and and has been persistent. Cough is productive. No fevers, nausea or vomiting. Patient had been using mucinex with no decrease in symptoms. Patient is A&O x4 and ambulatory with no distress.

## 2018-07-10 ENCOUNTER — Encounter: Payer: Self-pay | Admitting: Internal Medicine

## 2018-07-12 ENCOUNTER — Other Ambulatory Visit: Payer: Self-pay | Admitting: Urology

## 2018-07-13 ENCOUNTER — Telehealth: Payer: Self-pay | Admitting: Radiation Oncology

## 2018-07-13 ENCOUNTER — Other Ambulatory Visit: Payer: Self-pay | Admitting: Urology

## 2018-07-13 MED FILL — OXYCODONE-ACETAMINOPHEN 5-3: 5-325 | 45 days supply | Qty: 90 | Fill #0

## 2018-07-13 NOTE — Telephone Encounter (Signed)
Phoned patient to inform him that his Percocet has been escribed to [facility]. No answer. Left voicemail message detailing such. Left my contact information on the voicemail as well in the event of further needs.

## 2018-07-14 DIAGNOSIS — Z955 Presence of coronary angioplasty implant and graft: Secondary | ICD-10-CM

## 2018-07-14 HISTORY — DX: Presence of coronary angioplasty implant and graft: Z95.5

## 2018-07-17 ENCOUNTER — Encounter: Payer: Self-pay | Admitting: Internal Medicine

## 2018-07-31 ENCOUNTER — Other Ambulatory Visit: Payer: Self-pay | Admitting: Radiation Therapy

## 2018-07-31 DIAGNOSIS — C719 Malignant neoplasm of brain, unspecified: Secondary | ICD-10-CM

## 2018-08-23 ENCOUNTER — Telehealth: Payer: Self-pay | Admitting: Radiation Oncology

## 2018-08-23 ENCOUNTER — Other Ambulatory Visit: Payer: Self-pay | Admitting: Urology

## 2018-08-23 MED FILL — PHENYTOIN SODIUM EXTENDED 1: 100 | 30 days supply | Qty: 90 | Fill #1

## 2018-08-23 NOTE — Telephone Encounter (Signed)
Phoned patient and left a voicemail message.  Explained on message that the percocet refill he requested will be ready for pick up at [facility] long outpatient pharmacy on Friday. Provided my contact information for future questions.

## 2018-08-23 NOTE — Telephone Encounter (Signed)
-----   Message from [name], [place] sent at 08/23/2018  1:30 PM EDT ----- Regarding: RE: Refill request Done. Please let patient know Rx was sent to [place]. Ailene Ards ----- Message ----- From: Heywood Footman, RN Sent: 08/23/2018  12:37 PM EDT To: [name], PA-C Subject: Refill request                                 He is calling for that Percocet refill. He would like to pick it up Friday. Preferred pharmacy: [facility].   [name]

## 2018-08-25 MED FILL — OXYCODONE-ACETAMINOPHEN 5-3: 5-325 | 45 days supply | Qty: 90 | Fill #0

## 2018-09-13 ENCOUNTER — Other Ambulatory Visit: Payer: Self-pay

## 2018-09-13 DIAGNOSIS — G40109 Localization-related (focal) (partial) symptomatic epilepsy and epileptic syndromes with simple partial seizures, not intractable, without status epilepticus: Secondary | ICD-10-CM

## 2018-09-13 MED ORDER — PHENYTOIN SODIUM EXTENDED 100 MG PO CAPS: ORAL_CAPSULE | ORAL | 3 refills | Status: DC

## 2018-10-02 ENCOUNTER — Other Ambulatory Visit: Payer: Self-pay | Admitting: Urology

## 2018-10-02 MED FILL — PHENYTOIN SODIUM EXTENDED 1: 100 | 30 days supply | Qty: 90 | Fill #2

## 2018-10-04 ENCOUNTER — Telehealth: Payer: Self-pay | Admitting: Radiation Oncology

## 2018-10-04 NOTE — Telephone Encounter (Signed)
Phoned patient to inform him that script has been escribed to [facility]. No answer. Left voicemail message detailing such.

## 2018-10-05 MED FILL — OXYCODONE-ACETAMINOPHEN 5-3: 5-325 | 45 days supply | Qty: 90 | Fill #0

## 2018-10-16 ENCOUNTER — Encounter: Payer: Self-pay | Admitting: Internal Medicine

## 2018-10-18 ENCOUNTER — Other Ambulatory Visit: Payer: Medicare Other

## 2018-10-23 ENCOUNTER — Other Ambulatory Visit: Payer: Self-pay | Admitting: Radiation Therapy

## 2018-10-27 ENCOUNTER — Ambulatory Visit
Admission: RE | Admit: 2018-10-27 | Discharge: 2018-10-27 | Disposition: A | Discharge status: Home / Self Care | Payer: Medicare Other | Source: Ambulatory Visit | Attending: Radiation Oncology | Admitting: Radiation Oncology

## 2018-10-27 DIAGNOSIS — C719 Malignant neoplasm of brain, unspecified: Secondary | ICD-10-CM

## 2018-10-27 MED ORDER — GADOBENATE DIMEGLUMINE 529 MG/ML IV SOLN
17.0000 mL | Freq: Once | INTRAVENOUS | Status: AC | PRN
  Administered 2018-10-27: 17 mL via INTRAVENOUS

## 2018-10-31 ENCOUNTER — Telehealth: Payer: Self-pay | Admitting: *Deleted

## 2018-10-31 MED FILL — PHENYTOIN SODIUM EXTENDED 1: 100 | 90 days supply | Qty: 270 | Fill #0

## 2018-10-31 MED FILL — SYNJARDY TB 12.5MG/1000MG: 12.5-1000 | 30 days supply | Qty: 60 | Fill #1

## 2018-10-31 NOTE — Telephone Encounter (Signed)
CALLED PATIENT TO INFORM THAT FU VISIT FOR 11-01-18 WOULD NOT BE PERSON, IT WILL BE A TELEPHONE FU, LVM FOR  A RETURN CALL

## 2018-11-01 ENCOUNTER — Other Ambulatory Visit: Payer: Self-pay

## 2018-11-01 ENCOUNTER — Ambulatory Visit
Admission: RE | Admit: 2018-11-01 | Discharge: 2018-11-01 | Disposition: A | Discharge status: Home / Self Care | Payer: Medicare Other | Source: Ambulatory Visit | Attending: Urology | Admitting: Urology

## 2018-11-01 DIAGNOSIS — Z85841 Personal history of malignant neoplasm of brain: Secondary | ICD-10-CM | POA: Diagnosis not present

## 2018-11-01 DIAGNOSIS — Z08 Encounter for follow-up examination after completed treatment for malignant neoplasm: Secondary | ICD-10-CM | POA: Diagnosis not present

## 2018-11-01 DIAGNOSIS — C711 Malignant neoplasm of frontal lobe: Secondary | ICD-10-CM

## 2018-11-01 DIAGNOSIS — R51 Headache: Secondary | ICD-10-CM | POA: Diagnosis not present

## 2018-11-01 NOTE — Addendum Note (Signed)
Encounter addended by: [name], PA-C on: 11/01/2018 1:51 PM  Actions taken: Level of Service modified

## 2018-11-01 NOTE — Progress Notes (Signed)
Radiation Oncology         [phone]  Multidisciplinary Brain and Spine Oncology Clinic Follow-Up Visit Note  CC: Jonathan Ochs, MD  Jonathan Pall, MD    ICD-10-CM   1. Astrocytoma of frontal lobe (HCC)  C71.1         Diagnosis:   54 y.o.  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 gentleman who was treated for a primary brain tumor with surgical resection followed by conventional radiotherapy for his grade 2 astrocytoma, with concurrent Temodar. He has chronic pain from a surgical dehiscence from his craniotomy and night time headaches.  He has been followed closely in the multidisciplinary clinic, and has been referred to [facility] due to an interval MRI scan change, concerning for progression in the summer of 2017. He met with her and she did not recommend treatment at that time. He has been followed with punctate foci along the previous resection and treatment site but subsequent disease surveillance scans have shown disease stability.    I spoke with the patient to conduct his routine scheduled 6 month follow up visit and review his recent follow up MRI brain results via telephone to spare the patient unnecessary potential exposure in the healthcare setting during the current COVID-19 pandemic.  The patient was notified in advance and gave permission to proceed with this visit format.  His recent brain MRI on 10/27/18 revealed stability in his surgical resection cavity as well as in the corpus callosum, and in the right subinsular white matter.  The punctate enhancing region in the right insula has resolved and the small area of enhancement in the high left frontal lobe that has been noted previously is slightly improved as is the small area of enhancement in the left  anterior body of the corpus callosum, also improved. No evidence of increasing T2 signal or enhancement in the areas of treated glioma.  Of note,  he has choosen to continue follow up with Dr. [name] rather than with neuro oncology. He also remains on chronic narcotic pain medication for headaches.   On review of systems, the patient reports that he is doing well overall.  He denies any change in frequency or intensity of headaches, new visual or auditory changes or imbalance. He denies any chest pain, shortness of breath, cough, fevers, chills, night sweats, unintended weight changes. He denies any bowel or bladder disturbances, and denies abdominal pain, nausea or vomiting. He denies any new musculoskeletal or joint aches or pains, new skin lesions or concerns. A complete review of systems is obtained and is otherwise negative.  Past Medical History:  Past Medical History:  Diagnosis Date   Astrocytoma of frontal lobe ([facility]) 05/31/2013   DM (diabetes mellitus) ([place]) 08/09/2011   A1c at diagnosis = 12.4   Family history of breast cancer    GERD (gastroesophageal reflux disease) 08/09/2011   Seizure ([place]) 05/19/2013   Shortness of breath     Past Surgical History: Past Surgical History:  Procedure Laterality Date   APPENDECTOMY     BRAIN BIOPSY N/A 05/24/2013   Procedure: Craniotomy for open biopsy;  Surgeon: Winfield Cunas, MD;  Location: [facility];  Service: Neurosurgery;  Laterality: N/A;  Craniotomy for open biopsy    Social History:  Social History  Socioeconomic History   Marital status: Significant Other    Spouse name: Not on file   Number of children: 2   Years of education: Not on file   Highest education level: Not on file  Occupational History    Employer: [employer]  Social Needs   Financial resource strain: Not on file   Food insecurity    Worry: Not on file    Inability: Not on file   Transportation needs    Medical: Not on  file    Non-medical: Not on file  Tobacco Use   Smoking status: Current Every Day Smoker    Packs/day: 1.00    Years: 33.00    Pack years: 33.00    Types: Cigarettes   Smokeless tobacco: Never Used   Tobacco comment: Trying to quit. Down to 1 ppd, 10/11/13 down to 1/2 ppd  Substance and Sexual Activity   Alcohol use: Yes    Alcohol/week: 0.0 standard drinks    Comment: social   Drug use: Yes    Types: Marijuana    Comment: Marijuana daily   Sexual activity: Not on file  Lifestyle   Physical activity    Days per week: Not on file    Minutes per session: Not on file   Stress: Not on file  Relationships   Social connections    Talks on phone: Not on file    Gets together: Not on file    Attends religious service: Not on file    Active member of club or organization: Not on file    Attends meetings of clubs or organizations: Not on file    Relationship status: Not on file   Intimate partner violence    Fear of current or ex partner: Not on file    Emotionally abused: Not on file    Physically abused: Not on file    Forced sexual activity: Not on file  Other Topics Concern   Not on file  Social History Narrative   L[place] the [place]. He does not work now.     Family History: Family History  Problem Relation Age of Onset   Breast cancer Mother        dx in her 78s-60s   Bone cancer Mother    Diabetes Father    Heart disease Father    Diabetes Brother    Diabetes Paternal Uncle     ALLERGIES:  has No Known Allergies.  Meds: Current Outpatient Medications  Medication Sig Dispense Refill   Empagliflozin-metFORMIN HCl 12.08-998 MG TABS Take 1 tablet by mouth 2 (two) times daily. 60 tablet 2   ibuprofen (ADVIL,MOTRIN) 800 MG tablet Take 1 tablet (800 mg total) by mouth 3 (three) times daily. (Patient not taking: Reported on 05/02/2018) 21 tablet 0   nicotine polacrilex (NICORETTE) 4 MG gum Take 1 each (4 mg total) by mouth as needed  for smoking cessation. 100 tablet 0   oxyCODONE-acetaminophen (PERCOCET/ROXICET) 5-325 MG tablet TAKE 1 TABLET BY MOUTH AT BEDTIME. MAY REPEAT 1 ADDITIONAL TABLET AS NEEDED 90 tablet 0   phenytoin (DILANTIN) 100 MG ER capsule Take 3 capsules every morning 270 capsule 3   varenicline (CHANTIX STARTING MONTH PAK) 0.5 MG X 11 & 1 MG X 42 tablet Take one 0.5 mg tab once daily for 3 days, then increase to one 0.5 mg tab twice daily for 4 days, then increase to one 1 mg tab twice daily (Patient not taking: Reported on 05/02/2018)  53 tablet 0   No current facility-administered medications for this encounter.     Physical Findings: Wt Readings from Last 3 Encounters:  05/02/18 182 lb (82.6 kg)  03/27/18 184 lb 8 oz (83.7 kg)  03/21/18 183 lb (83 kg)   Temp Readings from Last 3 Encounters:  05/02/18 97.8 F (36.6 C) (Oral)  03/27/18 97.9 F (36.6 C) (Oral)  10/31/17 98 F (36.7 C) (Oral)   BP Readings from Last 3 Encounters:  05/02/18 (!) 133/92  03/27/18 (!) 129/93  03/21/18 122/80   Pulse Readings from Last 3 Encounters:  05/02/18 100  03/27/18 94  03/21/18 (!) 104   Unable to assess due to telephone follow up visit format.  Lab Findings: Lab Results  Component Value Date   WBC 9.2 11/16/2013   HGB 16.1 11/16/2013   HCT 46.8 11/16/2013   MCV 91.1 11/16/2013   PLT 279 11/16/2013   Radiographic Findings: Mr [facility] DG Contrast  Result Date: 10/27/2018 CLINICAL DATA:  Malignant neoplasm of brain. Grade 2 astrocytoma treated with resection, chemotherapy, and radiation Creatinine was obtained on site at [facility] at [address] Results: Creatinine 0.6 mg/dL. EXAM: MRI HEAD WITHOUT AND WITH CONTRAST TECHNIQUE: Multiplanar, multiecho pulse sequences of the brain and surrounding structures were obtained without and with intravenous contrast. CONTRAST:  23m MULTIHANCE GADOBENATE DIMEGLUMINE 529 MG/ML IV SOLN COMPARISON:  MRI head 10/27/2017 FINDINGS: Brain:  Postsurgical changes right frontal lobe. T2 hyperintensity in the right frontal lobe is unchanged. Hyperintensity crosses the corpus callosum into the left frontal lobe unchanged. Following contrast infusion, the enhancing nodule in the right insula anteriorly has resolved. No enhancement in the right frontal lesion. 2 mm enhancing nodule in the left frontal lobe on coronal image 25 has improved from prior studies. Small area of enhancement in the left anterior body of the corpus callosum also has improved from prior studies. Punctate enhancement right pons with susceptibility unchanged. 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 Ventricle size normal.  No acute infarct Vascular: Normal arterial flow voids. Small developmental venous anomaly left frontal lobe Skull and upper cervical spine: Right frontal craniotomy Sinuses/Orbits: Negative Other: None IMPRESSION: Post treatment changes in the right frontal lobe stable. T2 hyperintensity crosses the midline to the left frontal lobe also stable and likely due to treatment. Punctate enhancing region in the right insula has resolved. Small area of enhancement in the high left frontal lobe has been noted previously and is slightly improved. Small area of enhancement in the left anterior body of the corpus callosum also has improved. Electronically Signed   By: CFranchot GalloM.D.   On: 10/27/2018 12:03     Impression/Plan: 1. 53y.o. gentlelman with history of WHO grade II astrocytoma. The patient continues to be stable clinically as well as radiographically, and his recent MRI scan was reviewed on 10/30/18 in brain conference. His resection cavity is stable and no evidence of new lesions so recommendations were to continue with repeat imaging in 6 months time and we will plan a follow up visit thereafter to review results. He knows to call at any time with any questions or concerns in the interim. He is comfortable and in agreement with  the stated plan.  2. Chronic headaches and skull pain. The patient continues to be managed well with Advil and Percocet prn, using these sparingly, only for severe pain. He understands that his prescription will only be filled every 45 days.   Given current  concerns for patient exposure during the COVID-19 pandemic, this encounter was conducted via telephone. The patient was notified in advance has given verbal consent for this type of encounter. The time spent during this encounter was 25 minutes with 50% of that time spent in counseling and coordination of his care. The attendants for this meeting include  Jhoanna Heyde Barker and patient, Jonathan Barker. During the encounter, Jonathan Barker,was located at [facility] Radiation Oncology Department.  Patient, Jonathan Barker was located at home.   Nicholos Johns, MMS, [facility] [place] [facility] [place]: [phone]   Fax: [phone]

## 2018-11-13 ENCOUNTER — Other Ambulatory Visit: Payer: Self-pay | Admitting: Urology

## 2018-11-13 MED ORDER — OXYCODONE-ACETAMINOPHEN 5-325 MG PO TABS: ORAL_TABLET | ORAL | 0 refills | Status: DC

## 2018-11-14 ENCOUNTER — Telehealth: Payer: Self-pay | Admitting: Radiation Oncology

## 2018-11-14 NOTE — Telephone Encounter (Signed)
Phoned patient as requested making him aware his Percocet refill is ready for pick up at [facility]. Patient states, "I will go up there and get it on Friday."

## 2018-11-14 NOTE — Telephone Encounter (Signed)
-----   Message from Jonathan Barker, [place] sent at 11/13/2018  5:17 PM EDT ----- Regarding: RE: Medication refill request Please let patient know that the Rx was sent to [facility] as requested. -Ashlyn ----- Message ----- From: Heywood Footman, RN Sent: 11/13/2018  10:53 AM EDT To: [name], MD, Jonathan Caldron, PA-C, # Subject: Medication refill request                      Patient phoned today requesting a refill of his Percocet be sent to [facility]. Medication filled last on 10/02/2018 by Ashlyn.   Snip it from 11/01/2018 follow up note:   53 y.o. gentlelman with history of WHO grade II astrocytoma. The patient continues to be stable clinically as well as radiographically, and his recent MRI scan was reviewed on 10/30/18 in brain conference. His resection cavity is stable and no evidence of new lesions so recommendations were to continue with repeat imaging in 6 months time and we will plan a follow up visit thereafter to review results. Chronic headaches and skull pain. The patient continues to be managed well with Advil and Percocet prn, using these sparingly, only for severe pain. He understands that his prescription will only be filled every 45 days.   [name], RN

## 2018-11-15 ENCOUNTER — Other Ambulatory Visit: Payer: Self-pay | Admitting: Radiation Oncology

## 2018-11-17 MED FILL — OXYCODONE-ACETAMINOPHEN 5-3: 5-325 | 45 days supply | Qty: 90 | Fill #0

## 2018-12-26 ENCOUNTER — Telehealth: Payer: Self-pay | Admitting: Radiation Oncology

## 2018-12-26 ENCOUNTER — Other Ambulatory Visit: Payer: Self-pay | Admitting: Urology

## 2018-12-26 MED ORDER — OXYCODONE-ACETAMINOPHEN 5-325 MG PO TABS: ORAL_TABLET | ORAL | 0 refills | Status: DC

## 2018-12-26 MED FILL — OXYCODONE-ACETAMINOPHEN 5-3: 5-325 | 45 days supply | Qty: 90 | Fill #0

## 2018-12-26 NOTE — Telephone Encounter (Signed)
Phoned patient as requested by [name], PA-C and informed him a percocet script has been escribed to [facility]. Patient verbalized understanding.

## 2018-12-26 NOTE — Telephone Encounter (Signed)
-----   Message from [name], [place] sent at 12/26/2018  1:39 PM EDT ----- Regarding: RE: SCRIPT Done!  Rx sent.  Please let him know. Thank you! -Ash ----- Message ----- From: Heywood Footman, RN Sent: 12/26/2018  12:09 PM EDT To: [name], PA-C Subject: FW: SCRIPT                                     Ashlyn.   Leslie is requesting a refill of his percocet. Thanks in advance.   [name] ----- Message ----- From: Kerri Perches Sent: 12/25/2018  10:34 AM EDT To: Heywood Footman, RN Subject: SCRIPT                                         Jen Mow,   This patient wants his script called in to [facility].  Mr. Donaghey phone number is [phone].  Thanks,  [employer]

## 2019-02-02 ENCOUNTER — Telehealth: Payer: Self-pay | Admitting: *Deleted

## 2019-02-02 ENCOUNTER — Other Ambulatory Visit: Payer: Self-pay | Admitting: Urology

## 2019-02-02 MED ORDER — OXYCODONE-ACETAMINOPHEN 5-325 MG PO TABS: ORAL_TABLET | ORAL | 0 refills | Status: DC

## 2019-02-02 NOTE — Telephone Encounter (Signed)
CALLED PATIENT TO INFORM THAT IS SCRIPT SHOULD LAST HIM 45 DAYS, AND A SCRIPT WILL BE READY FOR PICK-UP ON 02-06-19 @ [facility], LVM FOR A RETURN CALL

## 2019-02-02 NOTE — Telephone Encounter (Signed)
XXXX 

## 2019-02-07 MED FILL — OXYCODONE-ACETAMINOPHEN 5-3: 5-325 | 45 days supply | Qty: 90 | Fill #0

## 2019-02-22 ENCOUNTER — Other Ambulatory Visit: Payer: Self-pay | Admitting: Radiation Therapy

## 2019-02-22 DIAGNOSIS — C719 Malignant neoplasm of brain, unspecified: Secondary | ICD-10-CM

## 2019-02-26 ENCOUNTER — Telehealth: Payer: Self-pay | Admitting: Radiation Therapy

## 2019-02-26 NOTE — Telephone Encounter (Signed)
Left a detailed message on Jonathan Barker's cell and spoke with his daughter, Overton Mam, about his upcoming brain MRI and follow-up visits in December. They are aware that the follow-up on 12/30 is not an in person visit, but a telephone call to share the results of his recent MRI. Due to Covid, we are still limiting the unnecessary exposure for patients when possible.   Mont Dutton R.T.(R)(T) Radiation Special Procedures Navigator

## 2019-03-08 ENCOUNTER — Ambulatory Visit (INDEPENDENT_AMBULATORY_CARE_PROVIDER_SITE_OTHER): Payer: Medicare Other | Admitting: Neurology

## 2019-03-08 ENCOUNTER — Other Ambulatory Visit: Payer: Self-pay

## 2019-03-08 ENCOUNTER — Encounter: Payer: Self-pay | Admitting: Neurology

## 2019-03-08 VITALS — Ht 72.0 in | Wt 183.0 lb

## 2019-03-08 DIAGNOSIS — G40109 Localization-related (focal) (partial) symptomatic epilepsy and epileptic syndromes with simple partial seizures, not intractable, without status epilepticus: Secondary | ICD-10-CM

## 2019-03-08 DIAGNOSIS — C719 Malignant neoplasm of brain, unspecified: Secondary | ICD-10-CM | POA: Diagnosis not present

## 2019-03-08 MED ORDER — PHENYTOIN SODIUM EXTENDED 100 MG PO CAPS: ORAL_CAPSULE | ORAL | 3 refills | Status: DC

## 2019-03-08 MED FILL — PHENYTOIN SODIUM EXTENDED 1: 100 | 90 days supply | Qty: 270 | Fill #0

## 2019-03-08 NOTE — Progress Notes (Signed)
Virtual Visit via Video Note The purpose of this virtual visit is to provide medical care while limiting exposure to the novel coronavirus.    Consent was obtained for video visit:  Yes.   Answered questions that patient had about telehealth interaction:  Yes.   I discussed the limitations, risks, security and privacy concerns of performing an evaluation and management service by telemedicine. I also discussed with the patient that there may be a patient responsible charge related to this service. The patient expressed understanding and agreed to proceed.  Pt location: Home Physician Location: office Name of referring provider:  Velna Ochs, MD I connected with Jonathan Barker at patients initiation/request on 03/08/2019 at 11:30 AM EST by video enabled telemedicine application and verified that I am speaking with the correct person using two identifiers. Pt MRN:  [id number] Pt DOB:  May 29, 1965 Video Participants:  Jonathan Barker;  Briant Sites (daughter)   History of Present Illness:  The patient was seen as a virtual video visit on 03/08/2019. He was last seen a year ago in the neurology clinic for seizures secondary to bifrontal grade II astrocytoma. His daughter is present during the e-visit to provide additional information. Since his last visit, they continue to deny any seizures since January 2015. He is on Dilantin 300mg  daily without side effects. Historically, Dilantin levels have been low, however since he has been seizure-free, we have been monitoring him clinically. He has not been interested in switching to a different seizure medication. He and his daughter deny any staring/unresponsive episodes, gaps in time, olfactory/gustatory hallucinations, focal numbness/tingling/weakness, myoclonic jerks. He has chronic headaches since brain biopsy and takes Percocet 1-2 tab daily. He denies any dizziness, vision changes, no falls. Mood "changes when you're broke." He is in  good spirits today. Sleep is good. I personally reviewed most recent MRI brain with and without contrast done 10/2018 which was stable, with post-treatment changes in the right frontal lobe, T2 hyperintensity crosses the midline to the left frontal lobe, stable. Punctate enhancing region on right insula resolved, small area of enhancement in high left frontal lobe and left anterior body of corpus callosum slightly improved.  History on Initial Assessment 04/15/2015: This is a pleasant 53 yo RH man who had a new onset seizure last 05/18/13 and was found to have an infiltrative mass in the anteromedial right frontal lobe without enhancement, with FLAIR signal extending across the corpus callosum into the contralateral subcortical left frontal lobe. His daughter reports that he started blinking excessively, he recalls feeling his eyes "blinking at 100 mph," he tried to sit but could not because his left hand was locked inside his pocket. He was witnessed to have generalized shaking for less than a minute. He reports that he recalls the entire event, he could hear people screaming around him. His daughter reports that he was coherent right after the seizure, no post-ictal confusion or focal weakness. He was brought to [facility] where imaging revealed the bifrontal mass, biopsy demonstrated diffuse grade 2 astrocytoma. Wake and drowsy EEG was normal. He took Temodar, then underwent radiotherapy until May 2015. He denies any further seizures since January, but does feel that he may have had a seizure the week after hospital discharge in January 2015, he woke up with his arms flexed, unsure if he had dreamed he had a seizure, no associated tongue bite or incontinence. His wife had mentioned around that time that she felt the bed shaking one night. Since January 2015, he  denies any further eye blinking episodes, generalized shaking or stiffening of extremities, olfactory/gustatory hallucinations, deja vu, rising epigastric  sensation, focal numbness/tingling/weakness, myoclonic jerks. He has had low Dilantin in levels in the past, and reports that he had only been taking it twice a day instead of TID, but with most recent level checked in November 2016 (<2.5), he reports taking Dilantin 100mg  TID. He denies any side effects to the medication.  He denies any prior history of headaches until brain biopsy, and since then he has been having frequent headaches. Headaches are over the frontal region, with pressure sensation, no associated nausea, vomiting, photo/phonophobia. He reports headaches occur at the end of the day, 3-4 times a week, with good response to Percocet. He usually takes the medication and goes to bed, with no headache on awakening. He notices more headaches with cold weather. He denies any dizziness, vision changes, dysarthria/dysphagia, neck/back pain, focal numbness/tingling/weakness, bowel/bladder dysfunction. He usually has 4 alcoholic drinks a month. He had a normal birth and early development. There is no history of febrile convulsions, CNS infections such as meningitis/encephalitis, significant traumatic brain injury, or family history of seizures.  I personally reviewed most recent MRI brain with and without contrast done 02/14/15 which showed abnormal FLAIR signal throughout the white matter of the frontal lobes, right more than left, unchanged, with small cystic area in the left frontal cortical/subcortical region that is noted to be slightly larger (59mm) compared to last year. There is a punctate focus of enhancement in the right frontal white matter, 23mm focus of enhancement in the right basal ganglia/external capsule that is new.      Current Outpatient Medications on File Prior to Visit  Medication Sig Dispense Refill  . Empagliflozin-metFORMIN HCl 12.08-998 MG TABS Take 1 tablet by mouth 2 (two) times daily. 60 tablet 2  . ibuprofen (ADVIL,MOTRIN) 800 MG tablet Take 1 tablet (800 mg total) by  mouth 3 (three) times daily. 21 tablet 0  . oxyCODONE-acetaminophen (PERCOCET/ROXICET) 5-325 MG tablet TAKE 1 TABLET BY MOUTH AT BEDTIME. MAY REPEAT 1 ADDITIONAL TABLET AS NEEDED 90 tablet 0  . phenytoin (DILANTIN) 100 MG ER capsule Take 3 capsules every morning 270 capsule 3   No current facility-administered medications on file prior to visit.      Observations/Objective:   Vitals:   03/08/19 1033  Weight: 183 lb (83 kg)  Height: 6' (1.829 m)   GEN:  The patient appears stated age and is in NAD.  Neurological examination: Patient is awake, alert, oriented x 3. No aphasia or dysarthria. Intact fluency and comprehension. Remote and recent memory intact. Able to name and repeat. Cranial nerves: Extraocular movements intact with no nystagmus. No facial asymmetry. Motor: moves all extremities symmetrically, at least anti-gravity x 4. No incoordination on finger to nose testing. Gait: narrow-based and steady, mild difficulty with tandem walk. Negative Romberg test.   Assessment and Plan:   This is a pleasant 53 yo RH man with a history of new onset seizure last January 2015 during which he was found to have a bifrontal (right greater than left) grade II astrocytoma. He underwent Temodar treatment and radiotherapy. Most recent MRI brain in June 2020 stable, he is scheduled for repeat imaging in December. He denies any seizures since 2015, no side effects on Dilantin 300mg  daily. Historically Dilantin levels have been low, he is not interested in switching medications, and since seizure-free, we have agreed to continue current dose of Dilantin without any changes and monitor clinically.  He is aware of [place] driving laws to stop driving after a seizure, until 6 months seizure-free. He will follow-up in 1 year and knows to call for any changes.   Follow Up Instructions:   -I discussed the assessment and treatment plan with the patient. The patient was provided an opportunity to ask questions and all  were answered. The patient agreed with the plan and demonstrated an understanding of the instructions.   The patient was advised to call back or seek an in-person evaluation if the symptoms worsen or if the condition fails to improve as anticipated.   Cameron Sprang, MD

## 2019-03-19 ENCOUNTER — Other Ambulatory Visit: Payer: Self-pay | Admitting: Urology

## 2019-03-19 MED ORDER — OXYCODONE-ACETAMINOPHEN 5-325 MG PO TABS: ORAL_TABLET | ORAL | 0 refills | Status: DC

## 2019-03-20 ENCOUNTER — Telehealth: Payer: Self-pay | Admitting: Radiation Oncology

## 2019-03-20 MED FILL — OXYCODONE-ACETAMINOPHEN 5-3: 5-325 | 45 days supply | Qty: 90 | Fill #0

## 2019-03-20 MED FILL — PHENYTOIN SODIUM EXTENDED 1: 100 | 90 days supply | Qty: 270 | Fill #1

## 2019-03-20 NOTE — Telephone Encounter (Signed)
Phoned patient's daughter, Overton Mam, making her aware her father's script is ready for pick up at [facility]. She verbalized understanding and expressed appreciation for the call.

## 2019-04-04 ENCOUNTER — Telehealth: Payer: Self-pay | Admitting: *Deleted

## 2019-04-04 NOTE — Telephone Encounter (Signed)
Returned patient's phone call, spoke with patient 

## 2019-04-05 ENCOUNTER — Telehealth: Payer: Self-pay | Admitting: Radiation Oncology

## 2019-04-05 NOTE — Telephone Encounter (Signed)
Received voicemail message from patient requesting return call. Phoned patient back. Answered all questions to the best of my ability reference social security paperwork. Patient verbalized understanding of all and expressed appreciation for the call back.

## 2019-04-30 ENCOUNTER — Ambulatory Visit: Payer: Medicare Other

## 2019-04-30 ENCOUNTER — Other Ambulatory Visit: Payer: Self-pay

## 2019-05-01 ENCOUNTER — Telehealth: Payer: Self-pay | Admitting: *Deleted

## 2019-05-01 NOTE — Telephone Encounter (Signed)
Returned patient's daughter phone call, lvm for a return call

## 2019-05-02 ENCOUNTER — Other Ambulatory Visit: Payer: Self-pay | Admitting: Urology

## 2019-05-02 ENCOUNTER — Inpatient Hospital Stay
Admission: RE | Admit: 2019-05-02 | Discharge: 2019-05-02 | Disposition: A | Discharge status: Home / Self Care | Payer: Medicare Other | Source: Ambulatory Visit | Attending: Urology | Admitting: Urology

## 2019-05-02 ENCOUNTER — Ambulatory Visit: Payer: Medicare Other | Admitting: Urology

## 2019-05-02 ENCOUNTER — Telehealth: Payer: Self-pay | Admitting: *Deleted

## 2019-05-02 MED ORDER — OXYCODONE-ACETAMINOPHEN 5-325 MG PO TABS: ORAL_TABLET | ORAL | 0 refills | Status: DC

## 2019-05-02 MED FILL — OXYCODONE-APAP 5-325MG: 5-325 | 45 days supply | Qty: 90 | Fill #0

## 2019-05-02 NOTE — Telephone Encounter (Signed)
Called patient to inform that his script should be ready for pick-up this afternoon, spoke with patient and he is aware of this

## 2019-05-18 ENCOUNTER — Ambulatory Visit
Admission: RE | Admit: 2019-05-18 | Discharge: 2019-05-18 | Disposition: A | Discharge status: Home / Self Care | Payer: Medicare Other | Source: Ambulatory Visit | Attending: Radiation Oncology | Admitting: Radiation Oncology

## 2019-05-18 ENCOUNTER — Other Ambulatory Visit: Payer: Self-pay

## 2019-05-18 DIAGNOSIS — C719 Malignant neoplasm of brain, unspecified: Secondary | ICD-10-CM

## 2019-05-18 DIAGNOSIS — D496 Neoplasm of unspecified behavior of brain: Secondary | ICD-10-CM | POA: Diagnosis not present

## 2019-05-18 MED ORDER — GADOBENATE DIMEGLUMINE 529 MG/ML IV SOLN
17.0000 mL | Freq: Once | INTRAVENOUS | Status: AC | PRN
  Administered 2019-05-18: 08:00:00 17 mL via INTRAVENOUS

## 2019-05-23 ENCOUNTER — Encounter: Payer: Self-pay | Admitting: Urology

## 2019-05-23 ENCOUNTER — Ambulatory Visit
Admission: RE | Admit: 2019-05-23 | Discharge: 2019-05-23 | Disposition: A | Discharge status: Home / Self Care | Payer: Medicare Other | Source: Ambulatory Visit | Attending: Urology | Admitting: Urology

## 2019-05-23 ENCOUNTER — Other Ambulatory Visit: Payer: Self-pay

## 2019-05-23 DIAGNOSIS — C711 Malignant neoplasm of frontal lobe: Secondary | ICD-10-CM

## 2019-05-23 DIAGNOSIS — Z08 Encounter for follow-up examination after completed treatment for malignant neoplasm: Secondary | ICD-10-CM | POA: Diagnosis not present

## 2019-05-23 DIAGNOSIS — R519 Headache, unspecified: Secondary | ICD-10-CM | POA: Diagnosis not present

## 2019-05-23 NOTE — Progress Notes (Signed)
Radiation Oncology         [phone]  Multidisciplinary Brain and Spine Oncology Clinic Follow-Up Visit Note  CC: Jonathan Ochs, MD  Jonathan Pall, MD    ICD-10-CM   1. Astrocytoma of frontal lobe (HCC)  C71.1         Diagnosis:   54 y.o.  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  Narrative:  Jonathan Barker is a pleasant 55 y.o. gentleman who was treated for a primary brain tumor with surgical resection followed by conventional radiotherapy for his grade II astrocytoma, with concurrent Temodar. He has continued with chronic pain from a surgical dehiscence from his craniotomy and night time headaches, unchanged recently.  He has been followed closely in the multidisciplinary clinic, and was referred to Dr. Tana Coast at the [facility], due to an interval MRI scan change, concerning for progression in the summer of 2017. He met with her and she did not recommend treatment at that time. He has been followed with punctate foci along the previous resection and treatment site but subsequent disease surveillance scans have shown disease stability.  He has also continued in routine follow up with Dr. Delice Lesch in neurology, for management of his anticonvulsant therapy.  He has continued taking Dilantin 300 mg daily and reports that he has remained seizure-free since the time of surgery in 05/2013.  He was last seen with Dr. Delice Lesch in 03/2019.    Interval History:  Today, I spoke with the patient to conduct his routine scheduled 6 month follow up visit and review his recent follow up MRI brain results via telephone to spare the patient unnecessary potential exposure in the healthcare setting during the current COVID-19 pandemic.  The patient was notified in advance and gave permission to  proceed with this visit format.  His recent brain MRI on 05/18/19 revealed stable postsurgical changes to the right frontal lobe with stable T2 hyperintensity within the right frontal lobe and also extending across the corpus callosum into the left frontal lobe, unchanged from prior MRI 10/27/2018. There is a new 2 mm focus of enhancement within the anterior right frontal lobe white matter, within the treated region as well as an additional 52m enhancing focus within the right frontal lobe, more laterally at the gray-white junction, also new from prior MRI 10/27/2018. His imaging has been personally reviewed by Dr. MTammi Klippeland Dr. VMickeal Skinnerwho recommend short interval follow up MRI to confirm stability and if any evidence for clinical or radiographic change, would need to proceed with biopsy for tissue confirmation.  Of note,  he has previously elected to continue follow up with Dr. MJohny ShearsPAs rather than with neuro oncology. He also remains on chronic narcotic pain medication for management of chronic headaches.   On review of systems, the patient reports that he is doing well overall.  He has not had any recent seizure activity and denies any change in frequency or intensity of headaches, new visual or auditory changes or imbalance. He denies any chest pain, shortness of breath, cough, fevers, chills, night sweats, unintended weight changes. He has developed some neuropathy in his feet associated with his diabetes. He denies any bowel or bladder disturbances, and denies abdominal pain, nausea or vomiting. He denies any new musculoskeletal or joint aches or pains, new skin lesions or concerns. A complete  review of systems is obtained and is otherwise negative.  Past Medical History:  Past Medical History:  Diagnosis Date  . Astrocytoma of frontal lobe ([place]) 05/31/2013  . DM (diabetes mellitus) ([place]) 08/09/2011   A1c at diagnosis = 12.4  . Family history of breast cancer   . GERD (gastroesophageal reflux  disease) 08/09/2011  . Seizure ([place]) 05/19/2013  . Shortness of breath     Past Surgical History: Past Surgical History:  Procedure Laterality Date  . APPENDECTOMY    . BRAIN BIOPSY N/A 05/24/2013   Procedure: Craniotomy for open biopsy;  Surgeon: Winfield Cunas, MD;  Location: [facility];  Service: Neurosurgery;  Laterality: N/A;  Craniotomy for open biopsy    Social History:  Social History   Socioeconomic History  . Marital status: Significant Other    Spouse name: Not on file  . Number of children: 2  . Years of education: Not on file  . Highest education level: Not on file  Occupational History    Employer: [employer]  Tobacco Use  . Smoking status: Current Every Day Smoker    Packs/day: 1.00    Years: 33.00    Pack years: 33.00    Types: Cigarettes  . Smokeless tobacco: Never Used  . Tobacco comment: Trying to quit. Down to 1 ppd, 10/11/13 down to 1/2 ppd  Substance and Sexual Activity  . Alcohol use: Yes    Alcohol/week: 0.0 standard drinks    Comment: social  . Drug use: Yes    Types: Marijuana    Comment: Marijuana daily  . Sexual activity: Not on file  Other Topics Concern  . Not on file  Social History Narrative   [place]. He does not work now.    Right handed      Highest level of edu- 11th grade      Lives in one story home. Steps to enter home   Social Determinants of Health   Financial Resource Strain:   . Difficulty of Paying Living Expenses: Not on file  Food Insecurity:   . Worried About Charity fundraiser in the Last Year: Not on file  . Ran Out of Food in the Last Year: Not on file  Transportation Needs:   . Lack of Transportation (Medical): Not on file  . Lack of Transportation (Non-Medical): Not on file  Physical Activity:   . Days of Exercise per Week: Not on file  . Minutes of Exercise per Session: Not on file  Stress:   . Feeling of Stress : Not on file  Social Connections:   . Frequency  of Communication with Friends and Family: Not on file  . Frequency of Social Gatherings with Friends and Family: Not on file  . Attends Religious Services: Not on file  . Active Member of Clubs or Organizations: Not on file  . Attends Archivist Meetings: Not on file  . Marital Status: Not on file  Intimate Partner Violence:   . Fear of Current or Ex-Partner: Not on file  . Emotionally Abused: Not on file  . Physically Abused: Not on file  . Sexually Abused: Not on file    Family History: Family History  Problem Relation Age of Onset  . Breast cancer Mother        dx in her 64s-60s  . Bone cancer Mother   . Diabetes Father   . Heart disease Father   . Diabetes Brother   .  Diabetes Paternal Uncle     ALLERGIES:  has No Known Allergies.  Meds: Current Outpatient Medications  Medication Sig Dispense Refill  . Empagliflozin-metFORMIN HCl 12.08-998 MG TABS Take 1 tablet by mouth 2 (two) times daily. 60 tablet 2  . ibuprofen (ADVIL,MOTRIN) 800 MG tablet Take 1 tablet (800 mg total) by mouth 3 (three) times daily. 21 tablet 0  . oxyCODONE-acetaminophen (PERCOCET/ROXICET) 5-325 MG tablet TAKE 1 TABLET BY MOUTH AT BEDTIME. MAY REPEAT 1 ADDITIONAL TABLET AS NEEDED 90 tablet 0  . phenytoin (DILANTIN) 100 MG ER capsule Take 3 capsules every morning 270 capsule 3   No current facility-administered medications for this encounter.    Physical Findings: Wt Readings from Last 3 Encounters:  03/08/19 183 lb (83 kg)  05/02/18 182 lb (82.6 kg)  03/27/18 184 lb 8 oz (83.7 kg)   Temp Readings from Last 3 Encounters:  05/02/18 97.8 F (36.6 C) (Oral)  03/27/18 97.9 F (36.6 C) (Oral)  10/31/17 98 F (36.7 C) (Oral)   BP Readings from Last 3 Encounters:  05/02/18 (!) 133/92  03/27/18 (!) 129/93  03/21/18 122/80   Pulse Readings from Last 3 Encounters:  05/02/18 100  03/27/18 94  03/21/18 (!) 104   Unable to assess due to telephone follow up visit format.  Lab  Findings: Lab Results  Component Value Date   WBC 9.2 11/16/2013   HGB 16.1 11/16/2013   HCT 46.8 11/16/2013   MCV 91.1 11/16/2013   PLT 279 11/16/2013   Radiographic Findings: MR Brain W Wo Contrast  Result Date: 05/18/2019 CLINICAL DATA:  Astrocytoma brain tumor. Follow-up treated grade 2 astrocytoma, radiation treatment completed May 2015; neoplasm: CNS primary. Creatinine was obtained on site at [facility] at [address] Results: Creatinine 0.7 mg/dL (GFR 109). EXAM: MRI HEAD WITHOUT AND WITH CONTRAST TECHNIQUE: Multiplanar, multiecho pulse sequences of the brain and surrounding structures were obtained without and with intravenous contrast. CONTRAST:  57m MULTIHANCE GADOBENATE DIMEGLUMINE 529 MG/ML IV SOLN COMPARISON:  Brain MRI examinations 10/27/2018 and earlier FINDINGS: Brain: Postsurgical changes to the right frontal lobe. T2 hyperintensity within the right frontal lobe and also extending across the corpus callosum into the left frontal lobe, unchanged from prior MRI 05/28/2018. There is a new 2 mm focus of enhancement within the anterior right frontal lobe white matter, within the treated region (series 14, image 96) (series 15, image 28). Although this focus does demonstrate some precontrast T1 hyperintensity, true enhancement is suspected. There is an additional 3 mm enhancing focus within the right frontal lobe more laterally at the gray-white junction, also new from prior MRI (series 14, image 108) (series 15, image 28). Unchanged punctate focus of enhancement within the anterior left frontal lobe (series 14, image 25). A subcentimeter focus of enhancement within the anterior body of corpus callosum, left of midline, is also unchanged (series 14, image 26). 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 No evidence  of acute infarct. No midline shift or extra-axial fluid collection. Small left frontal lobe developmental venous anomaly. Vascular: Flow voids maintained within the proximal large arterial vessels. Expected enhancement within the proximal large arterial vessels and dural venous sinuses. Skull and upper cervical spine: Right frontal cranioplasty. Sinuses/Orbits: Visualized orbits demonstrate no acute abnormality. Minimal ethmoid sinus mucosal thickening. Trace fluid within bilateral mastoid air cells. IMPRESSION: Post treatment changes within the right frontal lobe. There  is a new 2 mm enhancing focus within the right frontal lobe treated region. 3 mm enhancing focus more laterally within the right frontal lobe, also new from prior MRI 05/28/2018. Short interval contrast-enhanced MRI follow-up recommended. Unchanged T2 hyperintensity within the right frontal lobe, also crossing midline to the left frontal lobe. Small foci of enhancement within the left frontal lobe and anterior body of corpus callosum, unchanged. Electronically Signed   By: Kellie Simmering DO   On: 05/18/2019 08:46     Impression/Plan: 1. 54 y.o. gentlelman with history of WHO grade II astrocytoma. The patient continues to be stable clinically but his recent brain MRI on 05/18/19 revealed a new 2 mm focus of enhancement within the anterior right frontal lobe white matter, within the treated region as well as an additional 44m enhancing focus within the right frontal lobe, more laterally at the gray-white junction, also new from prior MRI 10/27/2018. His imaging has been personally reviewed by Dr. MTammi Klippeland Dr. VMickeal Skinnerwho recommend short interval follow up MRI in 2 months to confirm stability of these lesions and if any evidence for clinical or radiographic change, would need to proceed with biopsy for tissue confirmation. He is comfortable and in agreement with the stated plan and knows to call at any time with questions or concerns.  2. Chronic  headaches and skull pain. The patient continues to be managed well with Advil and Percocet prn, using these sparingly, only for severe pain. He understands that his prescription will only continue to be filled every 45 days.   Given current concerns for patient exposure during the COVID-19 pandemic, this encounter was conducted via telephone. The patient was notified in advance has given verbal consent for this type of encounter. The time spent during this encounter was 25 minutes with 50% of that time spent in counseling and coordination of his care. The attendants for this meeting include  Quran Vasco PA-C and patient, CShaye Lagace During the encounter, Janda Cargo PA-C,[facility].  Patient, CNiccolas Loeperwas located at home.   ANicholos Johns MMS, PA-C [facility] [phone] Fax: [phone]

## 2019-05-24 ENCOUNTER — Telehealth: Payer: Self-pay | Admitting: Neurology

## 2019-05-24 ENCOUNTER — Ambulatory Visit: Payer: Medicare Other

## 2019-05-24 NOTE — Telephone Encounter (Signed)
Patient is calling in wanting some medication for his neuropathy and pain in his feet. He uses the [facility]. Thanks!

## 2019-05-24 NOTE — Telephone Encounter (Signed)
Please advise on Friday, thanks

## 2019-05-25 NOTE — Telephone Encounter (Signed)
Spoke with pt he would like to talk to dr Delice Lesch vv on feb 2 at 2pm about his neuropathy

## 2019-05-25 NOTE — Telephone Encounter (Signed)
This is a new issue that we have not previously discussed, will need a visit, I cannot just prescribe medications. I have an opening on Feb 2 at 2pm for a virtual visit, otherwise he can speak to PCP in the meantime. Thanks

## 2019-06-05 ENCOUNTER — Telehealth (INDEPENDENT_AMBULATORY_CARE_PROVIDER_SITE_OTHER): Payer: Medicare Other | Admitting: Neurology

## 2019-06-05 ENCOUNTER — Encounter: Payer: Self-pay | Admitting: Neurology

## 2019-06-05 ENCOUNTER — Other Ambulatory Visit: Payer: Self-pay

## 2019-06-05 DIAGNOSIS — G40109 Localization-related (focal) (partial) symptomatic epilepsy and epileptic syndromes with simple partial seizures, not intractable, without status epilepticus: Secondary | ICD-10-CM

## 2019-06-05 DIAGNOSIS — E0842 Diabetes mellitus due to underlying condition with diabetic polyneuropathy: Secondary | ICD-10-CM | POA: Diagnosis not present

## 2019-06-05 MED ORDER — GABAPENTIN 300 MG PO CAPS: 300.0000 mg | ORAL_CAPSULE | Freq: Every day | ORAL | 11 refills | Status: DC

## 2019-06-05 MED FILL — GABAPENTIN 300 MG CAPSULE: 300 | 30 days supply | Qty: 30 | Fill #0

## 2019-06-05 NOTE — Progress Notes (Signed)
Telephone (Audio) Visit The purpose of this telephone visit is to provide medical care while limiting exposure to the novel coronavirus.    Consent was obtained for telephone visit:  Yes.   Answered questions that patient had about telehealth interaction:  Yes.   I discussed the limitations, risks, security and privacy concerns of performing an evaluation and management service by telephone. I also discussed with the patient that there may be a patient responsible charge related to this service. The patient expressed understanding and agreed to proceed.  Pt location: Home Physician Location: office Name of referring provider:  Velna Ochs, MD I connected with .Jonathan Barker at patients initiation/request on 06/05/2019 at  2:00 PM EST by telephone and verified that I am speaking with the correct person using two identifiers.  Pt MRN:  [id number] Pt DOB:  1965-07-15   History of Present Illness:  The patient presents for an earlier visit due to new symptoms. He had a telephone visit on 06/05/2019. He was last seen in the neurology clinic 3 months ago for seizures secondary to bifrontal grade II astrocytoma. Since his last visit, he continues to deny any seizures since January 2015 on Dilantin 300mg  daily. His main concern today is foot pain. Symptoms started soon after Christmas, there is stabbing pain in the 1st and 5th digits of his right foot. It mostly occurs at night, sometimes waking him up in the middle of the night. He denies any numbness. No back pain, bowel/bladder dysfunction, no falls. He denies any pain when walking. Hands are unaffected. He has not been back to his PCP and reports his last HbA1c 6 months ago was 9. He reports drinking 6 cans of [facility] daily.    History on Initial Assessment 04/15/2015: This is a pleasant 54 yo RH man who had a new onset seizure last 05/18/13 and was found to have an infiltrative mass in the anteromedial right frontal lobe without  enhancement, with FLAIR signal extending across the corpus callosum into the contralateral subcortical left frontal lobe. His daughter reports that he started blinking excessively, he recalls feeling his eyes "blinking at 100 mph," he tried to sit but could not because his left hand was locked inside his pocket. He was witnessed to have generalized shaking for less than a minute. He reports that he recalls the entire event, he could hear people screaming around him. His daughter reports that he was coherent right after the seizure, no post-ictal confusion or focal weakness. He was brought to [facility] where imaging revealed the bifrontal mass, biopsy demonstrated diffuse grade 2 astrocytoma. Wake and drowsy EEG was normal. He took Temodar, then underwent radiotherapy until May 2015. He denies any further seizures since January, but does feel that he may have had a seizure the week after hospital discharge in January 2015, he woke up with his arms flexed, unsure if he had dreamed he had a seizure, no associated tongue bite or incontinence. His wife had mentioned around that time that she felt the bed shaking one night. Since January 2015, he denies any further eye blinking episodes, generalized shaking or stiffening of extremities, olfactory/gustatory hallucinations, deja vu, rising epigastric sensation, focal numbness/tingling/weakness, myoclonic jerks. He has had low Dilantin in levels in the past, and reports that he had only been taking it twice a day instead of TID, but with most recent level checked in November 2016 (<2.5), he reports taking Dilantin 100mg  TID. He denies any side effects to the medication.  He denies any prior history of headaches until brain biopsy, and since then he has been having frequent headaches. Headaches are over the frontal region, with pressure sensation, no associated nausea, vomiting, photo/phonophobia. He reports headaches occur at the end of the day, 3-4 times a week, with good  response to Percocet. He usually takes the medication and goes to bed, with no headache on awakening. He notices more headaches with cold weather. He denies any dizziness, vision changes, dysarthria/dysphagia, neck/back pain, focal numbness/tingling/weakness, bowel/bladder dysfunction. He usually has 4 alcoholic drinks a month. He had a normal birth and early development. There is no history of febrile convulsions, CNS infections such as meningitis/encephalitis, significant traumatic brain injury, or family history of seizures.  I personally reviewed most recent MRI brain with and without contrast done 02/14/15 which showed abnormal FLAIR signal throughout the white matter of the frontal lobes, right more than left, unchanged, with small cystic area in the left frontal cortical/subcortical region that is noted to be slightly larger (33mm) compared to last year. There is a punctate focus of enhancement in the right frontal white matter, 31mm focus of enhancement in the right basal ganglia/external capsule that is new.      Current Outpatient Medications on File Prior to Visit  Medication Sig Dispense Refill  . Empagliflozin-metFORMIN HCl 12.08-998 MG TABS Take 1 tablet by mouth 2 (two) times daily. 60 tablet 2  . ibuprofen (ADVIL,MOTRIN) 800 MG tablet Take 1 tablet (800 mg total) by mouth 3 (three) times daily. 21 tablet 0  . oxyCODONE-acetaminophen (PERCOCET/ROXICET) 5-325 MG tablet TAKE 1 TABLET BY MOUTH AT BEDTIME. MAY REPEAT 1 ADDITIONAL TABLET AS NEEDED 90 tablet 0  . phenytoin (DILANTIN) 100 MG ER capsule Take 3 capsules every morning 270 capsule 3   No current facility-administered medications on file prior to visit.     Observations/Objective:  Limited due to nature of telephone visit. Patient is awake, alert, able to answer questions without dysarthria.   Assessment and Plan:   This is a pleasant 54 yo RH man with a history of new onset seizure last January 2015 during which he was found  to have a bifrontal (right greater than left) grade II astrocytoma. He underwent Temodar treatment and radiotherapy. He has been seizure-free since 2015 on Dilantin 300mg  daily. He presents today for new symptoms suggestive of diabetic neuropathy, we discussed better glucose control and follow-up with PCP. We discussed cutting down on soda intake. We also discussed symptomatic treatment with gabapentin 300mg  qhs, side effects discussed. We can uptitrate dose if necessary. He is aware of [place] driving laws to stop driving after a seizure, until 6 months seizure-free. He will follow-up as scheduled in November 2021, he knows to call for any changes.   Follow Up Instructions:   -I discussed the assessment and treatment plan with the patient. The patient was provided an opportunity to ask questions and all were answered. The patient agreed with the plan and demonstrated an understanding of the instructions.   The patient was advised to call back or seek an in-person evaluation if the symptoms worsen or if the condition fails to improve as anticipated.    Total Time spent in visit with the patient was 7:40 minutes of which 100% of the time was spent in counseling and/or coordinating care on the above.   Pt understands and agrees with the plan of care outlined.     Cameron Sprang, MD

## 2019-06-11 ENCOUNTER — Telehealth: Payer: Self-pay | Admitting: *Deleted

## 2019-06-11 NOTE — Telephone Encounter (Signed)
Returned patient's phone call, spoke with patient 

## 2019-06-12 ENCOUNTER — Other Ambulatory Visit: Payer: Self-pay | Admitting: Urology

## 2019-06-12 MED ORDER — OXYCODONE-ACETAMINOPHEN 5-325 MG PO TABS: ORAL_TABLET | ORAL | 0 refills | Status: DC

## 2019-06-13 ENCOUNTER — Telehealth: Payer: Self-pay | Admitting: *Deleted

## 2019-06-13 NOTE — Telephone Encounter (Signed)
CALLED PATIENT TO INFORM THAT HIS SCRIPT HAS BEEN CALLED INTO [facility], LVM FOR A RETURN CALL

## 2019-06-14 MED FILL — OXYCODONE-APAP 5-325MG: 5-325 | 45 days supply | Qty: 90 | Fill #0

## 2019-06-20 ENCOUNTER — Other Ambulatory Visit: Payer: Self-pay

## 2019-06-20 ENCOUNTER — Encounter: Payer: Self-pay | Admitting: Internal Medicine

## 2019-06-20 ENCOUNTER — Ambulatory Visit (INDEPENDENT_AMBULATORY_CARE_PROVIDER_SITE_OTHER): Payer: Medicare Other | Admitting: Internal Medicine

## 2019-06-20 DIAGNOSIS — G629 Polyneuropathy, unspecified: Secondary | ICD-10-CM | POA: Insufficient documentation

## 2019-06-20 DIAGNOSIS — G6289 Other specified polyneuropathies: Secondary | ICD-10-CM

## 2019-06-20 DIAGNOSIS — E1142 Type 2 diabetes mellitus with diabetic polyneuropathy: Secondary | ICD-10-CM

## 2019-06-20 DIAGNOSIS — Z7984 Long term (current) use of oral hypoglycemic drugs: Secondary | ICD-10-CM | POA: Diagnosis not present

## 2019-06-20 DIAGNOSIS — Z79899 Other long term (current) drug therapy: Secondary | ICD-10-CM

## 2019-06-20 DIAGNOSIS — E119 Type 2 diabetes mellitus without complications: Secondary | ICD-10-CM

## 2019-06-20 MED ORDER — EMPAGLIFLOZIN-METFORMIN HCL 12.5-1000 MG PO TABS: 1.0000 | ORAL_TABLET | Freq: Two times a day (BID) | ORAL | 1 refills | Status: DC

## 2019-06-20 MED FILL — SYNJARDY 12.5-1000 MG TABS: 12.5-1000 | 90 days supply | Qty: 180 | Fill #0

## 2019-06-20 NOTE — Assessment & Plan Note (Signed)
Patient has presented today for telehealth follow up of his diabetes. He is hesitant to come in person right now due to the coronavirus pandemic. His diabetes is chronically uncontrolled, Hgb A1c 10.7 last checked over a year ago. He says he is taking his empagliflozin-metformin, but admits to running out and needing a refill. On chart review, looks like this was last prescribed in November of 2019 with 2 refills (3 month supply). I suspect he has been off of treatment for sometime. He also admits to dietary indiscretion. We discussed low sugar / low carb diet for his diabetes. He will continue to work on this. I have asked him to follow up in person in 6 months for a repeat Hgb A1c.  -- Refilled empagliflozin-metformin BID -- Follow up 6 months

## 2019-06-20 NOTE — Progress Notes (Signed)
    This is a telephone encounter between [employer] and Velna Ochs on 06/20/2019 for Diabetes follow up. The visit was conducted with the patient located at home and Velna Ochs at [facility]. The patient's identity was confirmed using their DOB and current address. The patient has consented to being evaluated through a telephone encounter and understands the associated risks (an examination cannot be done and the patient may need to come in for an appointment) / benefits (allows the patient to remain at home, decreasing exposure to coronavirus). I personally spent 10 minutes on medical discussion.   CC: Diabetes  HPI:  [name] is a 54 y.o. male with past medical history outlined below. Patient was contacted for a telehealth appointment for DM follow up. For the details of today's visit, please refer to the assessment and plan.   Review of Systems  Neurological: Positive for sensory change and headaches.     Assessment & Plan:   See Encounters Tab for problem based charting.

## 2019-06-20 NOTE — Assessment & Plan Note (Signed)
Patient has developed paresthesias in his toes that is most bothersome at night. Reports his neurologist has started him on gabapentin which helps. We discussed this is likely nerve damage from his uncontrolled diabetes, and I encouraged patient to take his diabetes medication to prevent further progression.

## 2019-06-27 ENCOUNTER — Other Ambulatory Visit: Payer: Self-pay | Admitting: Radiation Therapy

## 2019-06-27 DIAGNOSIS — C719 Malignant neoplasm of brain, unspecified: Secondary | ICD-10-CM

## 2019-07-06 MED FILL — GABAPENTIN 300 MG CAPSULE: 300 | 30 days supply | Qty: 30 | Fill #1

## 2019-07-09 ENCOUNTER — Telehealth: Payer: Self-pay | Admitting: *Deleted

## 2019-07-09 NOTE — Telephone Encounter (Signed)
CALLED PATIENT TO INFORM THAT I TOLD SAMANTHA THAT HE NEEDS HIS SCRIPT CALLED IN, SPOKE WITH PATIENT AND HE VERIFIED UNDERSTANDING THIS

## 2019-07-11 ENCOUNTER — Telehealth: Payer: Self-pay | Admitting: Radiation Oncology

## 2019-07-11 NOTE — Telephone Encounter (Signed)
Opened in error

## 2019-07-11 NOTE — Telephone Encounter (Signed)
-----   Message from [place], [place] sent at 07/10/2019  5:50 PM EST ----- Regarding: RE: Medication refill He knows that I only refill this every 6 weeks, so this will not be refilled until 07/24/19. Ailene Ards ----- Message ----- From: Heywood Footman, RN Sent: 07/09/2019  11:57 AM EST To: [name], MD, [name], PA-C Subject: Medication refill                              Diagnosis:   54 y.o.  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  Patient phoned requesting a refill of percocet 5/325 mg. This was written last by [name], PA-C on 06/12/19 for a quantity of 90.   Preferred pharmacy: [facility]

## 2019-07-11 NOTE — Telephone Encounter (Signed)
-----   Message from [place], [place] sent at 07/10/2019  5:50 PM EST ----- Regarding: RE: Medication refill He knows that I only refill this every 6 weeks, so this will not be refilled until 07/24/19. Ailene Ards ----- Message ----- From: Heywood Footman, RN Sent: 07/09/2019  11:57 AM EST To: [name], MD, [name], PA-C Subject: Medication refill                              Diagnosis:   54 y.o.  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  Patient phoned requesting a refill of percocet 5/325 mg. This was written last by [name], PA-C on 06/12/19 for a quantity of 90.   Preferred pharmacy: [facility]

## 2019-07-11 NOTE — Telephone Encounter (Signed)
Phoned patient as requested by [name], PA-C. No answer. Left voicemail message explaining PA Bruning agreed to fill his percocet every six weeks thus he won't be due for another refill until 07/24/2019. Encouraged patient to call back with questions or concerns.

## 2019-07-13 ENCOUNTER — Other Ambulatory Visit: Payer: Self-pay

## 2019-07-13 ENCOUNTER — Ambulatory Visit
Admission: RE | Admit: 2019-07-13 | Discharge: 2019-07-13 | Disposition: A | Discharge status: Home / Self Care | Payer: Medicare Other | Source: Ambulatory Visit | Attending: Radiation Oncology | Admitting: Radiation Oncology

## 2019-07-13 DIAGNOSIS — C719 Malignant neoplasm of brain, unspecified: Secondary | ICD-10-CM | POA: Diagnosis not present

## 2019-07-13 MED ORDER — GADOBENATE DIMEGLUMINE 529 MG/ML IV SOLN
16.0000 mL | Freq: Once | INTRAVENOUS | Status: AC | PRN
  Administered 2019-07-13: 16 mL via INTRAVENOUS

## 2019-07-14 ENCOUNTER — Encounter (HOSPITAL_COMMUNITY): Payer: Self-pay | Admitting: *Deleted

## 2019-07-14 ENCOUNTER — Emergency Department (HOSPITAL_COMMUNITY): Payer: Medicare Other

## 2019-07-14 ENCOUNTER — Other Ambulatory Visit: Payer: Self-pay

## 2019-07-14 ENCOUNTER — Inpatient Hospital Stay (HOSPITAL_COMMUNITY)
Admission: EM | Admit: 2019-07-14 | Discharge: 2019-07-16 | DRG: 247 | Disposition: A | Discharge status: Home / Self Care | Payer: Medicare Other | Attending: Cardiology | Admitting: Cardiology

## 2019-07-14 DIAGNOSIS — E1142 Type 2 diabetes mellitus with diabetic polyneuropathy: Secondary | ICD-10-CM | POA: Diagnosis not present

## 2019-07-14 DIAGNOSIS — Z833 Family history of diabetes mellitus: Secondary | ICD-10-CM

## 2019-07-14 DIAGNOSIS — Z803 Family history of malignant neoplasm of breast: Secondary | ICD-10-CM

## 2019-07-14 DIAGNOSIS — R079 Chest pain, unspecified: Secondary | ICD-10-CM | POA: Diagnosis not present

## 2019-07-14 DIAGNOSIS — F17210 Nicotine dependence, cigarettes, uncomplicated: Secondary | ICD-10-CM | POA: Diagnosis present

## 2019-07-14 DIAGNOSIS — I4729 Other ventricular tachycardia: Secondary | ICD-10-CM

## 2019-07-14 DIAGNOSIS — Z79899 Other long term (current) drug therapy: Secondary | ICD-10-CM

## 2019-07-14 DIAGNOSIS — I214 Non-ST elevation (NSTEMI) myocardial infarction: Principal | ICD-10-CM | POA: Diagnosis present

## 2019-07-14 DIAGNOSIS — G40909 Epilepsy, unspecified, not intractable, without status epilepticus: Secondary | ICD-10-CM | POA: Diagnosis present

## 2019-07-14 DIAGNOSIS — C711 Malignant neoplasm of frontal lobe: Secondary | ICD-10-CM | POA: Diagnosis present

## 2019-07-14 DIAGNOSIS — Z9049 Acquired absence of other specified parts of digestive tract: Secondary | ICD-10-CM

## 2019-07-14 DIAGNOSIS — Z720 Tobacco use: Secondary | ICD-10-CM

## 2019-07-14 DIAGNOSIS — Z95820 Peripheral vascular angioplasty status with implants and grafts: Secondary | ICD-10-CM

## 2019-07-14 DIAGNOSIS — I472 Ventricular tachycardia: Secondary | ICD-10-CM | POA: Diagnosis not present

## 2019-07-14 DIAGNOSIS — C719 Malignant neoplasm of brain, unspecified: Secondary | ICD-10-CM

## 2019-07-14 DIAGNOSIS — Z923 Personal history of irradiation: Secondary | ICD-10-CM

## 2019-07-14 DIAGNOSIS — Z808 Family history of malignant neoplasm of other organs or systems: Secondary | ICD-10-CM

## 2019-07-14 DIAGNOSIS — E1169 Type 2 diabetes mellitus with other specified complication: Secondary | ICD-10-CM | POA: Diagnosis not present

## 2019-07-14 DIAGNOSIS — Z7984 Long term (current) use of oral hypoglycemic drugs: Secondary | ICD-10-CM

## 2019-07-14 DIAGNOSIS — Z20822 Contact with and (suspected) exposure to covid-19: Secondary | ICD-10-CM | POA: Diagnosis not present

## 2019-07-14 DIAGNOSIS — I249 Acute ischemic heart disease, unspecified: Secondary | ICD-10-CM | POA: Diagnosis not present

## 2019-07-14 DIAGNOSIS — Z79891 Long term (current) use of opiate analgesic: Secondary | ICD-10-CM

## 2019-07-14 DIAGNOSIS — Z9221 Personal history of antineoplastic chemotherapy: Secondary | ICD-10-CM

## 2019-07-14 DIAGNOSIS — I255 Ischemic cardiomyopathy: Secondary | ICD-10-CM | POA: Diagnosis present

## 2019-07-14 DIAGNOSIS — Z8249 Family history of ischemic heart disease and other diseases of the circulatory system: Secondary | ICD-10-CM

## 2019-07-14 DIAGNOSIS — E785 Hyperlipidemia, unspecified: Secondary | ICD-10-CM | POA: Diagnosis present

## 2019-07-14 DIAGNOSIS — I252 Old myocardial infarction: Secondary | ICD-10-CM | POA: Diagnosis present

## 2019-07-14 DIAGNOSIS — I2510 Atherosclerotic heart disease of native coronary artery without angina pectoris: Secondary | ICD-10-CM | POA: Diagnosis present

## 2019-07-14 LAB — BASIC METABOLIC PANEL
Anion gap: 13 (ref 5–15)
BUN: 11 mg/dL (ref 6–20)
CO2: 23 mmol/L (ref 22–32)
Calcium: 9.5 mg/dL (ref 8.9–10.3)
Chloride: 104 mmol/L (ref 98–111)
Creatinine, Ser: 0.77 mg/dL (ref 0.61–1.24)
GFR calc Af Amer: >60 mL/min (ref >60)
GFR calc non Af Amer: >60 mL/min (ref >60)
Glucose, Bld: 179 mg/dL — ABNORMAL HIGH (ref 70–99)
Potassium: 3.8 mmol/L (ref 3.5–5.1)
Sodium: 140 mmol/L (ref 135–145)

## 2019-07-14 LAB — CBC
HCT: 54 % — ABNORMAL HIGH (ref 39.0–52.0)
Hemoglobin: 18.1 g/dL — ABNORMAL HIGH (ref 13.0–17.0)
MCH: 30.2 pg (ref 26.0–34.0)
MCHC: 33.5 g/dL (ref 30.0–36.0)
MCV: 90 fL (ref 80.0–100.0)
Platelets: 345 10*3/uL (ref 150–400)
RBC: 6 MIL/uL — ABNORMAL HIGH (ref 4.22–5.81)
RDW: 12.1 % (ref 11.5–15.5)
WBC: 17 10*3/uL — ABNORMAL HIGH (ref 4.0–10.5)
nRBC: 0 % (ref 0.0–0.2)

## 2019-07-14 LAB — TROPONIN I (HIGH SENSITIVITY): Troponin I (High Sensitivity): 16945 ng/L (ref <18)

## 2019-07-14 MED ORDER — SODIUM CHLORIDE 0.9 % IV SOLN
INTRAVENOUS | Status: DC
  Administered 2019-07-14: 20 mL/h via INTRAVENOUS

## 2019-07-14 MED ORDER — HEPARIN SODIUM (PORCINE) 5000 UNIT/ML IJ SOLN
4000.0000 [IU] | Freq: Once | INTRAMUSCULAR | Status: AC
  Administered 2019-07-14: 4000 [IU] via INTRAVENOUS
  Filled 2019-07-14: qty 1

## 2019-07-14 MED ORDER — SODIUM CHLORIDE 0.9% FLUSH: 3.0000 mL | Freq: Once | INTRAVENOUS | Status: DC

## 2019-07-14 MED ORDER — NITROGLYCERIN 0.4 MG SL SUBL
0.4000 mg | SUBLINGUAL_TABLET | SUBLINGUAL | Status: DC | PRN
  Filled 2019-07-14: qty 1

## 2019-07-14 MED ORDER — ASPIRIN 81 MG PO CHEW
324.0000 mg | CHEWABLE_TABLET | Freq: Once | ORAL | Status: AC
  Administered 2019-07-14: 324 mg via ORAL
  Filled 2019-07-14: qty 4

## 2019-07-14 NOTE — ED Notes (Signed)
Activated Code Stemi--Eriyonna Matsushita 

## 2019-07-14 NOTE — ED Triage Notes (Signed)
The pt is c/o chest pain   Since 1000am  Today   No sob nausea or dizziness   The pt has brain cancer is not on chemotherapy or radiation    He had a mri yesterday of his brain

## 2019-07-14 NOTE — ED Notes (Signed)
All belongings given to daughter to take home.

## 2019-07-14 NOTE — ED Provider Notes (Signed)
TIME SEEN: 11:18 PM  CHIEF COMPLAINT: Chest pain  HPI: Patient is a 54 year old male with history of astrocytoma status post radiation, insulin-dependent diabetes, tobacco use who presents to the emergency department with diffuse anterior chest pain described as a pressure without radiation, shortness of breath, nausea and diaphoresis that started at 10 AM this morning.  Pain is still present and it is a 7/10 in the left side of his chest.  No associated vomiting, syncope.  Does not have a known cardiac history but has a brother who has had an MI.  Patient denies any aggravating or alleviating factors.  No fevers, cough, lower extremity swelling or pain.  ROS: See HPI Constitutional: no fever  Eyes: no drainage  ENT: no runny nose   Cardiovascular:   chest pain  Resp:  SOB  GI: no vomiting GU: no dysuria Integumentary: no rash  Allergy: no hives  Musculoskeletal: no leg swelling  Neurological: no slurred speech ROS otherwise negative  PAST MEDICAL HISTORY/PAST SURGICAL HISTORY:  Past Medical History:  Diagnosis Date  . Astrocytoma of frontal lobe ([place]) 05/31/2013  . DM (diabetes mellitus) ([place]) 08/09/2011   A1c at diagnosis = 12.4  . Family history of breast cancer   . GERD (gastroesophageal reflux disease) 08/09/2011  . Seizure ([place]) 05/19/2013  . Shortness of breath     MEDICATIONS:  Prior to Admission medications   Medication Sig Start Date End Date Taking? Authorizing Provider  Empagliflozin-metFORMIN HCl 12.08-998 MG TABS Take 1 tablet by mouth 2 (two) times daily. 06/20/19   Velna Ochs, MD  gabapentin (NEURONTIN) 300 MG capsule Take 1 capsule (300 mg total) by mouth at bedtime. 06/05/19   Cameron Sprang, MD  ibuprofen (ADVIL,MOTRIN) 800 MG tablet Take 1 tablet (800 mg total) by mouth 3 (three) times daily. 10/31/17   Zigmund Gottron, NP  oxyCODONE-acetaminophen (PERCOCET/ROXICET) 5-325 MG tablet TAKE 1 TABLET BY MOUTH AT BEDTIME. MAY REPEAT 1 ADDITIONAL TABLET AS NEEDED  06/12/19   Bruning, Ashlyn, PA-C  phenytoin (DILANTIN) 100 MG ER capsule Take 3 capsules every morning 03/08/19   Cameron Sprang, MD    ALLERGIES:  No Known Allergies  SOCIAL HISTORY:  Social History   Tobacco Use  . Smoking status: Current Every Day Smoker    Packs/day: 1.00    Years: 33.00    Pack years: 33.00    Types: Cigarettes  . Smokeless tobacco: Never Used  . Tobacco comment: Trying to quit. Down to 1 ppd, 10/11/13 down to 1/2 ppd  Substance Use Topics  . Alcohol use: Yes    Alcohol/week: 0.0 standard drinks    Comment: social    FAMILY HISTORY: Family History  Problem Relation Age of Onset  . Breast cancer Mother        dx in her 62s-60s  . Bone cancer Mother   . Diabetes Father   . Heart disease Father   . Diabetes Brother   . Diabetes Paternal Uncle     EXAM: BP 115/89 (BP Location: Right Arm)   Pulse 81   Temp 97.7 F (36.5 C) (Oral)   Resp 14   Ht 5\' 11"  (1.803 m)   Wt 78 kg   SpO2 99%   BMI 23.99 kg/m  CONSTITUTIONAL: Alert and oriented and responds appropriately to questions.  Chronically ill-appearing.  In no distress. HEAD: Normocephalic EYES: Conjunctivae clear, pupils appear equal, EOM appear intact ENT: normal nose; moist mucous membranes NECK: Supple, normal ROM CARD: RRR; S1 and S2  appreciated; no murmurs, no clicks, no rubs, no gallops RESP: Normal chest excursion without splinting or tachypnea; breath sounds clear and equal bilaterally; no wheezes, no rhonchi, no rales, no hypoxia or respiratory distress, speaking full sentences ABD/GI: Normal bowel sounds; non-distended; soft, non-tender, no rebound, no guarding, no peritoneal signs, no hepatosplenomegaly BACK:  The back appears normal EXT: Normal ROM in all joints; no deformity noted, no edema; no cyanosis, no calf tenderness or calf swelling SKIN: Normal color for age and race; warm; no rash on exposed skin NEURO: Moves all extremities equally PSYCH: The patient's mood and manner are  appropriate.   MEDICAL DECISION MAKING: Patient here with concerning story for chest pain.  Brought back to room immediately after troponin resulted and is 16,000.  Initial EKG shows concerning abnormalities in anterior and lateral leads.  Repeat appears to be worse.  Does not appear to meet STEMI criteria but given he is having active pain and worsening EKG changes with an elevated troponin, I have activated STEMI alert for ACS that I feel needs emergent intervention.  ED PROGRESS: 11:36 PM  Spoke with Dr. Einar Gip who agrees that patient needs to go emergently to the Cath Lab.  Cath Lab has been notified.  Patient and daughter updated at bedside.  11:45 PM  Pt now pain free after ASA and heparin.  Will hold NTG.    Dr. Einar Gip present and will take to cath lab now.  I reviewed all nursing notes and pertinent previous records as available.  I have interpreted any EKGs, lab and urine results, imaging (as available).    EKG Interpretation  Date/Time:  Saturday July 14 2019 21:33:10 EST Ventricular Rate:  82 PR Interval:    QRS Duration: 78 QT Interval:  394 QTC Calculation: 460 R Axis:   88 Text Interpretation: Normal sinus rhythm Septal infarct , age undetermined Abnormal ECG ST changes in anterior and lateral leads compared to 2013 Confirmed by Elner Seifert, Cyril Mourning [phone]) on 07/14/2019 11:35:06 PM       EKG Interpretation  Date/Time:  Saturday July 14 2019 23:24:31 EST Ventricular Rate:  87 PR Interval:    QRS Duration: 94 QT Interval:  400 QTC Calculation: 482 R Axis:   100 Text Interpretation: Normal sinus rhythm Anteroseptal infarct, age indeterminate Lateral leads are also involved Confirmed by Pryor Curia [phone]) on 07/14/2019 11:38:32 PM        CRITICAL CARE Performed by: Cyril Mourning Bevelyn Arriola   Total critical care time: 45 minutes  Critical care time was exclusive of separately billable procedures and treating other patients.  Critical care was necessary to treat or prevent  imminent or life-threatening deterioration.  Critical care was time spent personally by me on the following activities: development of treatment plan with patient and/or surrogate as well as nursing, discussions with consultants, evaluation of patient's response to treatment, examination of patient, obtaining history from patient or surrogate, ordering and performing treatments and interventions, ordering and review of laboratory studies, ordering and review of radiographic studies, pulse oximetry and re-evaluation of patient's condition.   [name] was evaluated in Emergency Department on 07/14/2019 for the symptoms described in the history of present illness. He was evaluated in the context of the global COVID-19 pandemic, which necessitated consideration that the patient might be at risk for infection with the SARS-CoV-2 virus that causes COVID-19. Institutional protocols and algorithms that pertain to the evaluation of patients at risk for COVID-19 are in a state of rapid change based on information released  by regulatory bodies including the CDC and federal and state organizations. These policies and algorithms were followed during the patient's care in the ED.  Patient was seen wearing N95, face shield, gloves.    Rayane Gallardo, Delice Bison, DO 07/15/19 0008

## 2019-07-14 NOTE — ED Notes (Signed)
tro called from lab  Charge nurse notified of high tro  Acuity increased to  2

## 2019-07-14 NOTE — ED Notes (Signed)
Dr.Mesner aware of trop results

## 2019-07-15 ENCOUNTER — Inpatient Hospital Stay (HOSPITAL_COMMUNITY): Payer: Medicare Other

## 2019-07-15 ENCOUNTER — Encounter (HOSPITAL_COMMUNITY)
Admission: EM | Disposition: A | Discharge status: Home / Self Care | Payer: Self-pay | Source: Home / Self Care | Attending: Cardiology

## 2019-07-15 ENCOUNTER — Encounter (HOSPITAL_COMMUNITY): Payer: Self-pay | Admitting: Cardiology

## 2019-07-15 DIAGNOSIS — F17210 Nicotine dependence, cigarettes, uncomplicated: Secondary | ICD-10-CM | POA: Diagnosis present

## 2019-07-15 DIAGNOSIS — Z9221 Personal history of antineoplastic chemotherapy: Secondary | ICD-10-CM | POA: Diagnosis not present

## 2019-07-15 DIAGNOSIS — E785 Hyperlipidemia, unspecified: Secondary | ICD-10-CM | POA: Diagnosis present

## 2019-07-15 DIAGNOSIS — C719 Malignant neoplasm of brain, unspecified: Secondary | ICD-10-CM | POA: Diagnosis not present

## 2019-07-15 DIAGNOSIS — Z833 Family history of diabetes mellitus: Secondary | ICD-10-CM | POA: Diagnosis not present

## 2019-07-15 DIAGNOSIS — Z720 Tobacco use: Secondary | ICD-10-CM | POA: Diagnosis not present

## 2019-07-15 DIAGNOSIS — Z9049 Acquired absence of other specified parts of digestive tract: Secondary | ICD-10-CM | POA: Diagnosis not present

## 2019-07-15 DIAGNOSIS — Z20822 Contact with and (suspected) exposure to covid-19: Secondary | ICD-10-CM | POA: Diagnosis present

## 2019-07-15 DIAGNOSIS — I472 Ventricular tachycardia: Secondary | ICD-10-CM | POA: Diagnosis present

## 2019-07-15 DIAGNOSIS — I4729 Other ventricular tachycardia: Secondary | ICD-10-CM

## 2019-07-15 DIAGNOSIS — Z95820 Peripheral vascular angioplasty status with implants and grafts: Secondary | ICD-10-CM | POA: Diagnosis not present

## 2019-07-15 DIAGNOSIS — G40909 Epilepsy, unspecified, not intractable, without status epilepticus: Secondary | ICD-10-CM | POA: Diagnosis present

## 2019-07-15 DIAGNOSIS — Z79891 Long term (current) use of opiate analgesic: Secondary | ICD-10-CM | POA: Diagnosis not present

## 2019-07-15 DIAGNOSIS — E1142 Type 2 diabetes mellitus with diabetic polyneuropathy: Secondary | ICD-10-CM | POA: Diagnosis present

## 2019-07-15 DIAGNOSIS — C711 Malignant neoplasm of frontal lobe: Secondary | ICD-10-CM | POA: Diagnosis present

## 2019-07-15 DIAGNOSIS — I25110 Atherosclerotic heart disease of native coronary artery with unstable angina pectoris: Secondary | ICD-10-CM | POA: Diagnosis not present

## 2019-07-15 DIAGNOSIS — Z7984 Long term (current) use of oral hypoglycemic drugs: Secondary | ICD-10-CM | POA: Diagnosis not present

## 2019-07-15 DIAGNOSIS — Z79899 Other long term (current) drug therapy: Secondary | ICD-10-CM | POA: Diagnosis not present

## 2019-07-15 DIAGNOSIS — I214 Non-ST elevation (NSTEMI) myocardial infarction: Secondary | ICD-10-CM

## 2019-07-15 DIAGNOSIS — I249 Acute ischemic heart disease, unspecified: Secondary | ICD-10-CM | POA: Diagnosis not present

## 2019-07-15 DIAGNOSIS — I2510 Atherosclerotic heart disease of native coronary artery without angina pectoris: Secondary | ICD-10-CM | POA: Diagnosis present

## 2019-07-15 DIAGNOSIS — Z808 Family history of malignant neoplasm of other organs or systems: Secondary | ICD-10-CM | POA: Diagnosis not present

## 2019-07-15 DIAGNOSIS — E782 Mixed hyperlipidemia: Secondary | ICD-10-CM | POA: Diagnosis not present

## 2019-07-15 DIAGNOSIS — Z8249 Family history of ischemic heart disease and other diseases of the circulatory system: Secondary | ICD-10-CM | POA: Diagnosis not present

## 2019-07-15 DIAGNOSIS — E1159 Type 2 diabetes mellitus with other circulatory complications: Secondary | ICD-10-CM | POA: Diagnosis not present

## 2019-07-15 DIAGNOSIS — Z923 Personal history of irradiation: Secondary | ICD-10-CM | POA: Diagnosis not present

## 2019-07-15 DIAGNOSIS — E1169 Type 2 diabetes mellitus with other specified complication: Secondary | ICD-10-CM | POA: Diagnosis present

## 2019-07-15 DIAGNOSIS — I252 Old myocardial infarction: Secondary | ICD-10-CM | POA: Diagnosis present

## 2019-07-15 DIAGNOSIS — I255 Ischemic cardiomyopathy: Secondary | ICD-10-CM | POA: Diagnosis present

## 2019-07-15 DIAGNOSIS — Z803 Family history of malignant neoplasm of breast: Secondary | ICD-10-CM | POA: Diagnosis not present

## 2019-07-15 HISTORY — PX: LEFT HEART CATH AND CORONARY ANGIOGRAPHY: CATH118249

## 2019-07-15 HISTORY — DX: Non-ST elevation (NSTEMI) myocardial infarction: I21.4

## 2019-07-15 HISTORY — PX: CORONARY/GRAFT ACUTE MI REVASCULARIZATION: CATH118305

## 2019-07-15 LAB — GLUCOSE, CAPILLARY
Glucose-Capillary: 243 mg/dL — ABNORMAL HIGH (ref 70–99)
Glucose-Capillary: 254 mg/dL — ABNORMAL HIGH (ref 70–99)
Glucose-Capillary: 177 mg/dL — ABNORMAL HIGH (ref 70–99)
Glucose-Capillary: 140 mg/dL — ABNORMAL HIGH (ref 70–99)

## 2019-07-15 LAB — BASIC METABOLIC PANEL
Anion gap: 12 (ref 5–15)
BUN: 10 mg/dL (ref 6–20)
CO2: 24 mmol/L (ref 22–32)
Calcium: 9.1 mg/dL (ref 8.9–10.3)
Chloride: 103 mmol/L (ref 98–111)
Creatinine, Ser: 0.75 mg/dL (ref 0.61–1.24)
GFR calc Af Amer: >60 mL/min (ref >60)
GFR calc non Af Amer: >60 mL/min (ref >60)
Glucose, Bld: 184 mg/dL — ABNORMAL HIGH (ref 70–99)
Potassium: 3.7 mmol/L (ref 3.5–5.1)
Sodium: 139 mmol/L (ref 135–145)

## 2019-07-15 LAB — MRSA PCR SCREENING: MRSA by PCR: NEGATIVE

## 2019-07-15 LAB — RESPIRATORY PANEL BY RT PCR (FLU A&B, COVID)
Influenza A by PCR: NEGATIVE
Influenza B by PCR: NEGATIVE
SARS Coronavirus 2 by RT PCR: NEGATIVE

## 2019-07-15 LAB — LIPID PANEL
Cholesterol: 244 mg/dL — ABNORMAL HIGH (ref 0–200)
HDL: 43 mg/dL (ref >40)
LDL Cholesterol: 176 mg/dL — ABNORMAL HIGH (ref 0–99)
Total CHOL/HDL Ratio: 5.7 RATIO
Triglycerides: 126 mg/dL (ref <150)
VLDL: 25 mg/dL (ref 0–40)

## 2019-07-15 LAB — CBC
HCT: 49.7 % (ref 39.0–52.0)
Hemoglobin: 17.1 g/dL — ABNORMAL HIGH (ref 13.0–17.0)
MCH: 30.5 pg (ref 26.0–34.0)
MCHC: 34.4 g/dL (ref 30.0–36.0)
MCV: 88.8 fL (ref 80.0–100.0)
Platelets: 330 10*3/uL (ref 150–400)
RBC: 5.6 MIL/uL (ref 4.22–5.81)
RDW: 12.1 % (ref 11.5–15.5)
WBC: 20.2 10*3/uL — ABNORMAL HIGH (ref 4.0–10.5)
nRBC: 0 % (ref 0.0–0.2)

## 2019-07-15 LAB — APTT: aPTT: 28 seconds (ref 24–36)

## 2019-07-15 LAB — PHENYTOIN LEVEL, TOTAL: Phenytoin Lvl: <2.5 ug/mL — ABNORMAL LOW (ref 10.0–20.0)

## 2019-07-15 LAB — MAGNESIUM: Magnesium: 2.1 mg/dL (ref 1.7–2.4)

## 2019-07-15 LAB — HEMOGLOBIN A1C
Hgb A1c MFr Bld: 10.6 % — ABNORMAL HIGH (ref 4.8–5.6)
Mean Plasma Glucose: 257.52 mg/dL

## 2019-07-15 LAB — PROTIME-INR
INR: 1 (ref 0.8–1.2)
Prothrombin Time: 13.3 seconds (ref 11.4–15.2)

## 2019-07-15 LAB — ECHOCARDIOGRAM COMPLETE
Height: 71 in
Weight: 2751.34 oz

## 2019-07-15 LAB — TROPONIN I (HIGH SENSITIVITY): Troponin I (High Sensitivity): 23743 ng/L (ref <18)

## 2019-07-15 SURGERY — CORONARY/GRAFT ACUTE MI REVASCULARIZATION
Anesthesia: LOCAL

## 2019-07-15 MED ORDER — ATORVASTATIN CALCIUM 80 MG PO TABS
80.0000 mg | ORAL_TABLET | Freq: Every day | ORAL | Status: DC
  Administered 2019-07-15: 17:00:00 80 mg via ORAL
  Filled 2019-07-15: qty 1

## 2019-07-15 MED ORDER — PANTOPRAZOLE SODIUM 40 MG PO TBEC
40.0000 mg | DELAYED_RELEASE_TABLET | Freq: Every day | ORAL | Status: DC
  Administered 2019-07-15 – 2019-07-16 (×2): 40 mg via ORAL
  Filled 2019-07-15 (×2): qty 1

## 2019-07-15 MED ORDER — IOHEXOL 350 MG/ML SOLN
INTRAVENOUS | Status: AC
  Filled 2019-07-15: qty 1

## 2019-07-15 MED ORDER — CLOPIDOGREL BISULFATE 300 MG PO TABS
600.0000 mg | ORAL_TABLET | Freq: Once | ORAL | Status: AC
  Administered 2019-07-15: 600 mg via ORAL
  Filled 2019-07-15: qty 2

## 2019-07-15 MED ORDER — TIROFIBAN HCL IN NACL 5-0.9 MG/100ML-% IV SOLN
INTRAVENOUS | Status: AC | PRN
  Administered 2019-07-15: 0.15 ug/kg/min via INTRAVENOUS

## 2019-07-15 MED ORDER — FENTANYL CITRATE (PF) 100 MCG/2ML IJ SOLN
INTRAMUSCULAR | Status: DC | PRN
  Administered 2019-07-15: 50 ug via INTRAVENOUS
  Administered 2019-07-15: 25 ug via INTRAVENOUS

## 2019-07-15 MED ORDER — SODIUM CHLORIDE 0.9% FLUSH
3.0000 mL | Freq: Two times a day (BID) | INTRAVENOUS | Status: DC
  Administered 2019-07-15 – 2019-07-16 (×3): 3 mL via INTRAVENOUS

## 2019-07-15 MED ORDER — SODIUM CHLORIDE 0.9 % IV SOLN: 250.0000 mL | INTRAVENOUS | Status: DC | PRN

## 2019-07-15 MED ORDER — MIDAZOLAM HCL 2 MG/2ML IJ SOLN
INTRAMUSCULAR | Status: DC | PRN
  Administered 2019-07-15 (×2): 1 mg via INTRAVENOUS

## 2019-07-15 MED ORDER — SODIUM CHLORIDE 0.9% FLUSH: 3.0000 mL | INTRAVENOUS | Status: DC | PRN

## 2019-07-15 MED ORDER — TICAGRELOR 90 MG PO TABS: 90.0000 mg | ORAL_TABLET | Freq: Two times a day (BID) | ORAL | Status: DC

## 2019-07-15 MED ORDER — TICAGRELOR 90 MG PO TABS
ORAL_TABLET | ORAL | Status: DC | PRN
  Administered 2019-07-15 (×2): 180 mg via ORAL

## 2019-07-15 MED ORDER — HEPARIN SODIUM (PORCINE) 1000 UNIT/ML IJ SOLN
INTRAMUSCULAR | Status: AC
  Filled 2019-07-15: qty 1

## 2019-07-15 MED ORDER — SODIUM CHLORIDE 0.9 % WEIGHT BASED INFUSION
1.0000 mL/kg/h | INTRAVENOUS | Status: AC
  Administered 2019-07-15: 1 mL/kg/h via INTRAVENOUS

## 2019-07-15 MED ORDER — LIDOCAINE HCL (PF) 1 % IJ SOLN
INTRAMUSCULAR | Status: AC
  Filled 2019-07-15: qty 30

## 2019-07-15 MED ORDER — LIDOCAINE HCL (PF) 1 % IJ SOLN
INTRAMUSCULAR | Status: DC | PRN
  Administered 2019-07-15: 2 mL

## 2019-07-15 MED ORDER — FENTANYL CITRATE (PF) 100 MCG/2ML IJ SOLN
INTRAMUSCULAR | Status: AC
  Filled 2019-07-15: qty 2

## 2019-07-15 MED ORDER — HYDRALAZINE HCL 20 MG/ML IJ SOLN: 5.0000 mg | INTRAMUSCULAR | Status: AC | PRN

## 2019-07-15 MED ORDER — MIDAZOLAM HCL 2 MG/2ML IJ SOLN
INTRAMUSCULAR | Status: AC
  Filled 2019-07-15: qty 2

## 2019-07-15 MED ORDER — TICAGRELOR 90 MG PO TABS
ORAL_TABLET | ORAL | Status: AC
  Filled 2019-07-15: qty 2

## 2019-07-15 MED ORDER — ASPIRIN 81 MG PO CHEW
81.0000 mg | CHEWABLE_TABLET | Freq: Every day | ORAL | Status: DC
  Administered 2019-07-15 – 2019-07-16 (×2): 81 mg via ORAL
  Filled 2019-07-15 (×2): qty 1

## 2019-07-15 MED ORDER — TIROFIBAN HCL IN NACL 5-0.9 MG/100ML-% IV SOLN
INTRAVENOUS | Status: AC
  Filled 2019-07-15: qty 100

## 2019-07-15 MED ORDER — ONDANSETRON HCL 4 MG/2ML IJ SOLN
INTRAMUSCULAR | Status: AC
  Filled 2019-07-15: qty 2

## 2019-07-15 MED ORDER — NITROGLYCERIN 1 MG/10 ML FOR IR/CATH LAB
INTRA_ARTERIAL | Status: AC
  Filled 2019-07-15: qty 10

## 2019-07-15 MED ORDER — POTASSIUM CHLORIDE CRYS ER 20 MEQ PO TBCR
20.0000 meq | EXTENDED_RELEASE_TABLET | Freq: Once | ORAL | Status: AC
  Administered 2019-07-15: 20 meq via ORAL
  Filled 2019-07-15: qty 1

## 2019-07-15 MED ORDER — ACETAMINOPHEN 325 MG PO TABS: 650.0000 mg | ORAL_TABLET | ORAL | Status: DC | PRN

## 2019-07-15 MED ORDER — HEPARIN SODIUM (PORCINE) 1000 UNIT/ML IJ SOLN
INTRAMUSCULAR | Status: DC | PRN
  Administered 2019-07-15 (×2): 2000 [IU] via INTRAVENOUS
  Administered 2019-07-15: 5000 [IU] via INTRAVENOUS

## 2019-07-15 MED ORDER — PHENYTOIN SODIUM EXTENDED 100 MG PO CAPS
300.0000 mg | ORAL_CAPSULE | Freq: Every day | ORAL | Status: DC
  Administered 2019-07-15 – 2019-07-16 (×2): 300 mg via ORAL
  Filled 2019-07-15 (×2): qty 3

## 2019-07-15 MED ORDER — HEPARIN (PORCINE) IN NACL 1000-0.9 UT/500ML-% IV SOLN
INTRAVENOUS | Status: AC
  Filled 2019-07-15: qty 1000

## 2019-07-15 MED ORDER — METOPROLOL TARTRATE 50 MG PO TABS
50.0000 mg | ORAL_TABLET | Freq: Two times a day (BID) | ORAL | Status: DC
  Administered 2019-07-16: 50 mg via ORAL
  Filled 2019-07-15: qty 1

## 2019-07-15 MED ORDER — METOPROLOL TARTRATE 25 MG PO TABS
25.0000 mg | ORAL_TABLET | Freq: Two times a day (BID) | ORAL | Status: DC
  Administered 2019-07-15 (×2): 25 mg via ORAL
  Filled 2019-07-15 (×2): qty 1

## 2019-07-15 MED ORDER — INSULIN ASPART 100 UNIT/ML ~~LOC~~ SOLN: 0.0000 [IU] | Freq: Every day | SUBCUTANEOUS | Status: DC

## 2019-07-15 MED ORDER — CLOPIDOGREL BISULFATE 75 MG PO TABS
75.0000 mg | ORAL_TABLET | Freq: Every day | ORAL | Status: DC
  Administered 2019-07-16: 75 mg via ORAL
  Filled 2019-07-15: qty 1

## 2019-07-15 MED ORDER — TIROFIBAN (AGGRASTAT) BOLUS VIA INFUSION
INTRAVENOUS | Status: DC | PRN
  Administered 2019-07-15: 02:00:00 1950 ug via INTRAVENOUS

## 2019-07-15 MED ORDER — IOHEXOL 350 MG/ML SOLN
INTRAVENOUS | Status: DC | PRN
  Administered 2019-07-15: 02:00:00 185 mL

## 2019-07-15 MED ORDER — VERAPAMIL HCL 2.5 MG/ML IV SOLN
INTRAVENOUS | Status: AC
  Filled 2019-07-15: qty 2

## 2019-07-15 MED ORDER — INSULIN ASPART 100 UNIT/ML ~~LOC~~ SOLN
0.0000 [IU] | Freq: Three times a day (TID) | SUBCUTANEOUS | Status: DC
  Administered 2019-07-15: 3 [IU] via SUBCUTANEOUS
  Administered 2019-07-15: 8 [IU] via SUBCUTANEOUS
  Administered 2019-07-16: 3 [IU] via SUBCUTANEOUS
  Administered 2019-07-16: 2 [IU] via SUBCUTANEOUS

## 2019-07-15 MED ORDER — GABAPENTIN 300 MG PO CAPS
300.0000 mg | ORAL_CAPSULE | Freq: Two times a day (BID) | ORAL | Status: DC
  Administered 2019-07-15 – 2019-07-16 (×3): 300 mg via ORAL
  Filled 2019-07-15 (×3): qty 1

## 2019-07-15 MED ORDER — ONDANSETRON HCL 4 MG/2ML IJ SOLN
INTRAMUSCULAR | Status: DC | PRN
  Administered 2019-07-15: 4 mg via INTRAVENOUS

## 2019-07-15 MED ORDER — LISINOPRIL 5 MG PO TABS
5.0000 mg | ORAL_TABLET | Freq: Every day | ORAL | Status: DC
  Administered 2019-07-16: 5 mg via ORAL
  Filled 2019-07-15: qty 1

## 2019-07-15 MED ORDER — HEPARIN (PORCINE) IN NACL 1000-0.9 UT/500ML-% IV SOLN
INTRAVENOUS | Status: DC | PRN
  Administered 2019-07-15 (×2): 500 mL

## 2019-07-15 MED ORDER — INSULIN ASPART 100 UNIT/ML ~~LOC~~ SOLN
4.0000 [IU] | Freq: Three times a day (TID) | SUBCUTANEOUS | Status: DC
  Administered 2019-07-15 – 2019-07-16 (×3): 4 [IU] via SUBCUTANEOUS

## 2019-07-15 MED ORDER — VERAPAMIL HCL 2.5 MG/ML IV SOLN
INTRAVENOUS | Status: DC | PRN
  Administered 2019-07-15: 5 mL via INTRA_ARTERIAL

## 2019-07-15 MED ORDER — OXYCODONE-ACETAMINOPHEN 5-325 MG PO TABS: 1.0000 | ORAL_TABLET | Freq: Every evening | ORAL | Status: DC | PRN

## 2019-07-15 SURGICAL SUPPLY — 25 items
BALLN SAPPHIRE 2.5X12 (BALLOONS) ×2
BALLN SAPPHIRE 3.0X15 (BALLOONS) ×2
BALLN SAPPHIRE ~~LOC~~ 3.5X10 (BALLOONS) ×1 IMPLANT
BALLOON SAPPHIRE 2.5X12 (BALLOONS) IMPLANT
BALLOON SAPPHIRE 3.0X15 (BALLOONS) IMPLANT
CATH INFINITI 5 FR AR1 MOD (CATHETERS) ×1 IMPLANT
CATH OPTITORQUE TIG 4.0 5F (CATHETERS) ×1 IMPLANT
CATH VISTA GUIDE 6FR XB3.5 (CATHETERS) ×1 IMPLANT
DEVICE RAD COMP TR BAND LRG (VASCULAR PRODUCTS) ×1 IMPLANT
GLIDESHEATH SLEND A-KIT 6F 22G (SHEATH) ×1 IMPLANT
GLIDESHEATH SLEND SS 6F .021 (SHEATH) ×1 IMPLANT
GUIDEWIRE INQWIRE 1.5J.035X260 (WIRE) IMPLANT
INQWIRE 1.5J .035X260CM (WIRE) ×2
KIT ENCORE 26 ADVANTAGE (KITS) ×1 IMPLANT
KIT HEART LEFT (KITS) ×2 IMPLANT
PACK CARDIAC CATHETERIZATION (CUSTOM PROCEDURE TRAY) ×2 IMPLANT
SHEATH PROBE COVER 6X72 (BAG) ×1 IMPLANT
STENT RESOLUTE ONYX 2.5X12 (Permanent Stent) ×1 IMPLANT
STENT RESOLUTE ONYX 2.5X15 (Permanent Stent) ×1 IMPLANT
STENT RESOLUTE ONYX3.0X38 (Permanent Stent) ×1 IMPLANT
TRANSDUCER W/STOPCOCK (MISCELLANEOUS) ×2 IMPLANT
TUBING ART PRESS 72  MALE/FEM (TUBING) ×2
TUBING ART PRESS 72 MALE/FEM (TUBING) IMPLANT
TUBING CIL FLEX 10 FLL-RA (TUBING) ×2 IMPLANT
WIRE COUGAR XT STRL 190CM (WIRE) ×1 IMPLANT

## 2019-07-15 NOTE — Progress Notes (Signed)
Subjective:  Patient seen and examined at bedside in approximately 11:50 AM. Patient denies any chest pain or shortness of breath. Patient has done well after left heart catheterization and PCI. TR band currently in place. Plan of care discussed with both the patient and his daughter at bedside.  Objective:  Vital Signs in the last 24 hours: Temp:  [97.6 F (36.4 C)-98.3 F (36.8 C)] 97.6 F (36.4 C) (03/14 1225) Pulse Rate:  [73-117] 100 (03/14 1225) Resp:  [11-22] 16 (03/14 1225) BP: (111-139)/(83-101) 111/87 (03/14 1225) SpO2:  [94 %-100 %] 99 % (03/14 1225) Weight:  [78 kg] 78 kg (03/14 0300)  Intake/Output from previous day: 03/13 0701 - 03/14 0700 In: 132.5 [P.O.:100; I.V.:32.5] Out: 150 [Urine:150]  Physical Exam  Constitutional: He is oriented to person, place, and time. He appears well-developed and well-nourished. No distress.  HENT:  Head: Normocephalic and atraumatic.  Eyes: Right eye exhibits no discharge. Left eye exhibits no discharge. No scleral icterus.  Neck: No JVD present. No tracheal deviation present.  Cardiovascular: Normal rate and regular rhythm. Exam reveals no friction rub.  No murmur heard. Pulmonary/Chest: Breath sounds normal. No respiratory distress. He has no wheezes. He has no rales.  Abdominal: Soft. Bowel sounds are normal. He exhibits no distension. There is no abdominal tenderness. There is no rebound and no guarding.  Musculoskeletal:        General: No edema. Normal range of motion.     Cervical back: Normal range of motion and neck supple.     Comments: TR band over the right radial artery.   Neurological: He is alert and oriented to person, place, and time. No cranial nerve deficit.  Skin: Skin is dry. No rash noted. He is not diaphoretic. No erythema.  Psychiatric: He has a normal mood and affect. His behavior is normal. Judgment and thought content normal.   Lab Results: BMP Recent Labs    07/14/19 2154 07/15/19 0310  NA 140  139  K 3.8 3.7  CL 104 103  CO2 23 24  GLUCOSE 179* 184*  BUN 11 10  CREATININE 0.77 0.75  CALCIUM 9.5 9.1  GFRNONAA >60 >60  GFRAA >60 >60    CBCe Recent Labs  Lab 07/15/19 0310  WBC 20.2*  RBC 5.60  HGB 17.1*  HCT 49.7  PLT 330  MCV 88.8  MCH 30.5  MCHC 34.4  RDW 12.1    HEMOGLOBIN A1C Lab Results  Component Value Date   HGBA1C 10.6 (H) 07/14/2019   MPG 257.52 07/14/2019    Cardiac Panel (last 3 results) No results for input(s): CKTOTAL, CKMB, TROPONINI, RELINDX in the last 8760 hours.  BNP (last 3 results) No results for input(s): BNP in the last 8760 hours.  TSH No results for input(s): TSH in the last 8760 hours.  Lipid Panel     Component Value Date/Time   CHOL 244 (H) 07/14/2019 2336   TRIG 126 07/14/2019 2336   HDL 43 07/14/2019 2336   CHOLHDL 5.7 07/14/2019 2336   VLDL 25 07/14/2019 2336   LDLCALC 176 (H) 07/14/2019 2336   Hepatic Function Panel No results for input(s): PROT, ALBUMIN, AST, ALT, ALKPHOS, BILITOT, BILIDIR, IBILI in the last 8760 hours.  Imaging: Imaging results have been reviewed and DG Chest 2 View  Result Date: 07/14/2019 CLINICAL DATA:  Chest pain. EXAM: CHEST - 2 VIEW COMPARISON:  June 21, 2018 FINDINGS: The heart size and mediastinal contours are within normal limits. Both lungs are clear. The  visualized skeletal structures are unremarkable. IMPRESSION: No active cardiopulmonary disease. Electronically Signed   By: Virgina Norfolk M.D.   On: 07/14/2019 22:02   CARDIAC CATHETERIZATION  Result Date: 07/15/2019 Left Heart Catheterization 07/15/19: LV: Mid to distal anterior, anterolateral and apical akinesis, EF 35 to at most 40%.  Hand contrast injection, not well visualized.  Normal LVEDP. RCA: Dominant, mild diffuse disease. Left main: Mildly calcified. LAD: Diffusely diseased proximal segment, mid segment had a 90% stenosis.  Mid to distal segment has minimal disease and apical LAD had 99% stenosis.  Successful PTCA  and stenting of the proximal/ostial and mid LAD with implantation of a long 3.0 x 38 mm resolute Onyx DES postdilated with 3.5 x 12 mm noncompliant sapphire balloon.  90% stenosis reduced to 0% with TIMI-3 to TIMI-3 flow.  Apical LAD stenosis left alone. Ramus intermediate: Large vessel, ostium has ulcerated appearing 99% stenosis.  Upon angioplasty realized it was also calcified and probably chronic.  Successful PTCA and stenting with 2 overlapping 2.5 x 15 and distally 2.5 x 13 mm resolute Onyx DES.  9 9% stenosis reduced to 0% with TIMI-3 to TIMI-3 flow. Circumflex: Moderate to large vessel, proximal to mid segment is diffusely diseased 30%.  Large OM1 with proximal long 60% to at most 70% stenosis. Recommendation: Patient will be started on low-dose beta-blocker for now due to poor tolerance and if blood pressure is stable will escalate the dose.  He was also started on low-dose ACE inhibitor's and high intensity high-dose atorvastatin.  He will receive aspirin indefinitely and Brilinta for at least 1 year in view of ACS. Patient has astrocytoma and abnormal MRI, we will have to discuss with his oncologist regarding any contraindication for antiplatelet use.  I did not think surgery would be more appropriate in the situation where high-dose heparin had to be utilized and potential for intracranial bleeding.  Also patient had nonsustained VT just prior to angioplasty while we were reviewing the angiogram and hence the lesion was felt to be very unstable when I decided to proceed with angioplasty.  ECHOCARDIOGRAM COMPLETE  Result Date: 07/15/2019    ECHOCARDIOGRAM REPORT   Patient Name:   Jonathan Barker [facility] Date of Exam: 07/15/2019 Medical Rec #:  [id number]          Height:       71.0 in Accession #:    [id number]         Weight:       172.0 lb Date of Birth:  May 11, 1965          BSA:          1.977 m Patient Age:    54 years           BP:           112/71 mmHg Patient Gender: M                  HR:            92 bpm. Exam Location:  Inpatient Procedure: 2D Echo Indications:     R07.9* Chest pain, unspecified; I25.700 Atherosclerosis of                  coronary artery bypass graft(s), unspecified, with unstable                  angina pectoris  History:         Patient has no prior history of Echocardiogram examinations.  Sonographer:  Edmund Referring Phys:  [place] Diagnosing Phys: Adrian Prows MD IMPRESSIONS  1. Left ventricular ejection fraction, by estimation, is 30 to 35%. The left ventricle has moderate to severely decreased function. The left ventricle demonstrates regional wall motion abnormalities (see scoring diagram/findings for description). There is mild left ventricular hypertrophy. Left ventricular diastolic parameters are consistent with Grade I diastolic dysfunction (impaired relaxation). Entire mid to distal anterior and antero septum and inferoseptum and apex is akinetic. No mural thrombus.  2. Right ventricular systolic function is normal. The right ventricular size is normal. There is normal pulmonary artery systolic pressure.  3. The mitral valve is normal in structure. Trivial mitral valve regurgitation.  4. The aortic valve is tricuspid. Aortic valve regurgitation is not visualized. Mild aortic valve sclerosis is present, with no evidence of aortic valve stenosis.  5. The inferior vena cava is normal in size with greater than 50% respiratory variability, suggesting right atrial pressure of 3 mmHg. FINDINGS  Left Ventricle: Left ventricular ejection fraction, by estimation, is 30 to 35%. The left ventricle has moderate to severely decreased function. The left ventricle demonstrates regional wall motion abnormalities. The left ventricular internal cavity size was normal in size. There is mild left ventricular hypertrophy. Left ventricular diastolic parameters are consistent with Grade I diastolic dysfunction (impaired relaxation). Normal left ventricular filling pressure.  LV Wall  Scoring: The mid and distal anterior wall, apical lateral segment, mid anterolateral segment, mid inferoseptal segment, apical septal segment, and apex are akinetic. Entire mid to distal anterior and antero septum and inferoseptum and apex is akinetic. No mural thrombus. Right Ventricle: The right ventricular size is normal. No increase in right ventricular wall thickness. Right ventricular systolic function is normal. There is normal pulmonary artery systolic pressure. The tricuspid regurgitant velocity is 2.46 m/s, and  with an assumed right atrial pressure of 3 mmHg, the estimated right ventricular systolic pressure is [id number] mmHg. Left Atrium: Left atrial size was normal in size. Right Atrium: Right atrial size was normal in size. Pericardium: There is no evidence of pericardial effusion. Mitral Valve: The mitral valve is normal in structure. Trivial mitral valve regurgitation. Tricuspid Valve: The tricuspid valve is normal in structure. Tricuspid valve regurgitation is mild. Aortic Valve: The aortic valve is tricuspid. Aortic valve regurgitation is not visualized. Mild aortic valve sclerosis is present, with no evidence of aortic valve stenosis. Pulmonic Valve: The pulmonic valve was grossly normal. Pulmonic valve regurgitation is trivial. Aorta: The aortic root is normal in size and structure. Venous: The inferior vena cava is normal in size with greater than 50% respiratory variability, suggesting right atrial pressure of 3 mmHg. IAS/Shunts: No atrial level shunt detected by color flow Doppler.  LEFT VENTRICLE PLAX 2D LVIDd:         4.80 cm      Diastology LVIDs:         2.90 cm      LV e' medial:   5.77 cm/s LV PW:         1.10 cm      LV E/e' medial: 9.4 LV IVS:        1.30 cm LVOT diam:     1.70 cm LV SV:         34 LV SV Index:   17 LVOT Area:     2.27 cm  LV Volumes (MOD) LV vol d, MOD A2C: 114.0 ml LV vol d, MOD A4C: 129.0 ml LV vol s, MOD A2C: 71.2  ml LV vol s, MOD A4C: 74.5 ml LV SV MOD A2C:     42.8  ml LV SV MOD A4C:     129.0 ml LV SV MOD BP:      49.3 ml RIGHT VENTRICLE            IVC RV S prime:     6.85 cm/s  IVC diam: 1.40 cm LEFT ATRIUM             Index       RIGHT ATRIUM          Index LA diam:        1.90 cm 0.96 cm/m  RA Area:     8.08 cm LA Vol (A2C):   35.2 ml 17.80 ml/m RA Volume:   16.10 ml 8.14 ml/m LA Vol (A4C):   16.7 ml 8.45 ml/m LA Biplane Vol: 26.2 ml 13.25 ml/m  AORTIC VALVE             PULMONIC VALVE LVOT Vmax:   94.60 cm/s  PV Vmax:       2.46 m/s LVOT Vmean:  68.800 cm/s PV Peak grad:  24.2 mmHg LVOT VTI:    0.151 m  AORTA Ao Root diam: 3.80 cm MITRAL VALVE               TRICUSPID VALVE MV Area (PHT): 5.38 cm    TR Peak grad:   24.2 mmHg MV Decel Time: 141 msec    TR Vmax:        246.00 cm/s MV E velocity: 54.00 cm/s MV A velocity: 69.80 cm/s  SHUNTS MV E/A ratio:  0.77        Systemic VTI:  0.15 m                            Systemic Diam: 1.70 cm Adrian Prows MD Electronically signed by Adrian Prows MD Signature Date/Time: 07/15/2019/1:58:53 PM    Final     Cardiac Studies: EKG 07/14/2019: Normal sinus rhythm, no evidence of ischemia.  Compared to prior EKG done a few minutes ago, minimal ST elevation in V1 through V3 which does not meet STEMI criteria appears to have improved.  Echocardiogram: LVEF 30-35%, moderate to severely decreased function, with regional wall motion abnormalities, grade 1 diastolic impairment, no mural thrombus, no pulmonary hypertension.  Left Heart Catheterization 07/15/19:  LV: Mid to distal anterior, anterolateral and apical akinesis, EF 35 to at most 40%.  Hand contrast injection, not well visualized.  Normal LVEDP. RCA: Dominant, mild diffuse disease. Left main: Mildly calcified. LAD: Diffusely diseased proximal segment, mid segment had a 90% stenosis.  Mid to distal segment has minimal disease and apical LAD had 99% stenosis.  Successful PTCA and stenting of the proximal/ostial and mid LAD with implantation of a long 3.0 x 38 mm resolute Onyx  DES postdilated with 3.5 x 12 mm noncompliant sapphire balloon.  90% stenosis reduced to 0% with TIMI-3 to TIMI-3 flow.  Apical LAD stenosis left alone. Ramus intermediate: Large vessel, ostium has ulcerated appearing 99% stenosis.  Upon angioplasty realized it was also calcified and probably chronic.  Successful PTCA and stenting with 2 overlapping 2.5 x 15 and distally 2.5 x 13 mm resolute Onyx DES.  9 9% stenosis reduced to 0% with TIMI-3 to TIMI-3 flow. Circumflex: Moderate to large vessel, proximal to mid segment is diffusely diseased 30%.  Large OM1 with proximal long 60% to at  most 70% stenosis.  Assessment & Recommendations: Non-STEMI.  Patient underwent left heart catheterization by Dr. Einar Gip and is status post angioplasty and stenting as described above.  Continue dual antiplatelet therapies.  Started on lisinopril 5 mg p.o. daily.  Started on Lopressor 25 mg p.o. twice daily.  Started on high intensity statin, Lipitor 80 mg p.o. nightly  Echocardiogram performed results noted above.  Patient is educated on the importance of lifestyle modification, and medication compliance and follow-up.  Patient is hemoglobin A1c is greater than 10.  He is instructed to follow-up with his primary care physician for further management.  Patient's LDL is greater than 70 mg/dL.  Initiated statin therapy.  Educated both the patient and his daughter on the importance of medication compliance in managing modifiable cardiovascular risk factors.  Nonsustained ventricular tachycardia:  Initiated beta blocker therapy.  Continue telemetry.  Check magnesium and potassium, goal potassium greater than 4 and magnesium greater than 2.  We will start K-Dur 20 mg p.o. daily.  Recheck a.m. BMP magnesium level.  Established coronary artery disease status post angioplasty and stenting: As described above  Status post angioplasty and stenting:  Continue dual antiplatelet therapy and uptitrate guideline  directed medical therapy as tolerated by hemodynamics and laboratory values.  Continue improving modifiable cardiovascular risk factors.  Diabetes mellitus type 2, uncontrolled with CAD  Patient and daughter are both educated on seeing PCP and/or endocrinology post discharge for better glycemic control.  Started on insulin sliding scale.  Hyperlipidemia, uncontrolled with type 2 diabetes mellitus  LDL currently not at goal.  Initiated high intensity statin therapy in the setting of non-STEMI and CAD.  Patient does not endorse any evidence of myalgias.  Tobacco use disorder.  Educated on importance of complete smoking cessation.  Bifrontal astrocytoma: Currently managed by oncology and outpatient follow-up recommended post discharge.  Rex Kras, DO, [facility]. [place] Office: [phone]

## 2019-07-15 NOTE — Progress Notes (Signed)
Patient coughed up blood quarter size sputum x 1, then HR went up to 150's for few minutes. Educated patient to do Valsalva maneuver x 3-4 times. Then HR went back down to 90's. Continue to monitor patient's condition. HS [employer]

## 2019-07-15 NOTE — Plan of Care (Signed)

## 2019-07-15 NOTE — Progress Notes (Signed)
VT 7 beats at 1136. Patient coughed up another blood ( dark red ) amount like 5 ml. Notified PA Tolia regarding this matter. Patient stated that he feels fine, no chest pain or light headedness. Continue to monitor patient condition. Patient daughter was concerning regarding this. HS [employer]

## 2019-07-15 NOTE — H&P (Signed)
CARDIOLOGY ADMIT NOTE   Patient ID: Jonathan Barker MRN: [id number] DOB/AGE: 1965-12-31 54 y.o.  Admit date: 07/14/2019 Primary Physician:  Velna Ochs, MD  Patient ID: Jonathan Barker, male    DOB: [birth year], 54 y.o.   MRN: [id number]  Chief Complaint  Patient presents with  . Code STEMI   HPI:    Jonathan Barker  is a 54 y.o. Caucasian male with history of astrocytoma of the bilateral frontal lobes who has had chemotherapy and radiation therapy, recent MRI from yesterday revealing suspicious lesions which appear to have slightly progressed.  Ongoing since 2015.  Admitted to the hospital when he presented to the emergency room with 7 out of 10 intensity chest pain that started at 10 AM this morning on 07/14/2019.  In the emergency room initially had mild ST elevation in anterior leads, was started on IV heparin and aspirin was given and although EKG did not meet the criteria for STEMI, in view of ongoing chest pain and markedly elevated serum troponin, STEMI was activated.  Patient was seen by me in the emergency room.  By the time I could bring the patient back to the Cath Lab, patient was chest pain-free.  States that chest pain was in the form of tightness to burning sensation in the chest associated with mild shortness of breath with radiation to the left arm.  His daughter is present at the bedside.  No diaphoresis, nausea or vomiting.  Past medical history significant for diabetes mellitus.  Past Medical History:  Diagnosis Date  . Astrocytoma of frontal lobe ([place]) 05/31/2013  . DM (diabetes mellitus) ([place]) 08/09/2011   A1c at diagnosis = 12.4  . Family history of breast cancer   . GERD (gastroesophageal reflux disease) 08/09/2011  . Seizure ([address]) 05/19/2013  . Shortness of breath    Past Surgical History:  Procedure Laterality Date  . APPENDECTOMY    . BRAIN BIOPSY N/A 05/24/2013   Procedure: Craniotomy for open biopsy;  Surgeon: Winfield Cunas, MD;  Location: [facility];  Service: Neurosurgery;  Laterality: N/A;  Craniotomy for open biopsy   Social History   Socioeconomic History  . Marital status: Significant Other    Spouse name: Not on file  . Number of children: 2  . Years of education: Not on file  . Highest education level: Not on file  Occupational History    Employer: [employer]  Tobacco Use  . Smoking status: Current Every Day Smoker    Packs/day: 1.00    Years: 33.00    Pack years: 33.00    Types: Cigarettes  . Smokeless tobacco: Never Used  . Tobacco comment: Trying to quit. Down to 1 ppd, 10/11/13 down to 1/2 ppd  Substance and Sexual Activity  . Alcohol use: Yes    Alcohol/week: 0.0 standard drinks    Comment: social  . Drug use: Yes    Types: Marijuana    Comment: Marijuana daily  . Sexual activity: Not on file  Other Topics Concern  . Not on file  Social History Narrative   L[place] the [address] [place]. He does not work now.    Right handed      Highest level of edu- 11th grade      Lives in one story home. Steps to enter home   Social Determinants of Health   Financial Resource Strain:   . Difficulty of Paying Living Expenses:   Food Insecurity:   . Worried About  Running Out of Food in the Last Year:   . [place] in the Last Year:   Transportation Needs:   . Lack of Transportation (Medical):   Marland Kitchen Lack of Transportation (Non-Medical):   Physical Activity:   . Days of Exercise per Week:   . Minutes of Exercise per Session:   Stress:   . Feeling of Stress :   Social Connections:   . Frequency of Communication with Friends and Family:   . Frequency of Social Gatherings with Friends and Family:   . Attends Religious Services:   . Active Member of Clubs or Organizations:   . Attends Archivist Meetings:   Marland Kitchen Marital Status:   Intimate Partner Violence:   . Fear of Current or Ex-Partner:   . Emotionally Abused:   Marland Kitchen Physically Abused:   . Sexually Abused:    ROS   Review of Systems  Constitution: Positive for malaise/fatigue.  Cardiovascular: Positive for chest pain and dyspnea on exertion (chronic and mild). Negative for leg swelling.  Gastrointestinal: Negative for melena.  Neurological: Positive for paresthesias (feet) and seizures (no recent seizures).  All other systems reviewed and are negative.  Objective   Vitals with BMI 07/14/2019 07/14/2019 07/14/2019  Height '5\' 11"'$  - -  Weight 172 lbs 172 lbs -  BMI 24 - -  Systolic - - 697  Diastolic - - 89  Pulse - - 81     Physical Exam  Constitutional: He is oriented to person, place, and time. He appears well-developed and well-nourished.  Cardiovascular: Normal rate, regular rhythm, normal heart sounds and intact distal pulses. Exam reveals no gallop.  No murmur heard. No leg edema, no JVD.  Pulmonary/Chest: Effort normal and breath sounds normal.  Abdominal: Soft. Bowel sounds are normal.  Musculoskeletal:        General: Normal range of motion.  Neurological: He is alert and oriented to person, place, and time.   Laboratory examination:    Recent Labs    07/14/19 2154  NA 140  K 3.8  CL 104  CO2 23  GLUCOSE 179*  BUN 11  CREATININE 0.77  CALCIUM 9.5  GFRNONAA >60  GFRAA >60   estimated creatinine clearance is 115 mL/min (by C-G formula based on SCr of 0.77 mg/dL).  CMP Latest Ref Rng & Units 07/14/2019 04/12/2016 01/19/2016  Glucose 70 - 99 mg/dL 179(H) - 142(H)  BUN 6 - 20 mg/dL 11 - 19  Creatinine 0.61 - 1.24 mg/dL 0.77 0.70 0.75(L)  Sodium 135 - 145 mmol/L 140 - 141  Potassium 3.5 - 5.1 mmol/L 3.8 - 4.3  Chloride 98 - 111 mmol/L 104 - 102  CO2 22 - 32 mmol/L 23 - 21  Calcium 8.9 - 10.3 mg/dL 9.5 - 9.6  Total Protein 6.4 - 8.3 g/dL - - -  Total Bilirubin 0.20 - 1.20 mg/dL - - -  Alkaline Phos 40 - 150 U/L - - -  AST 5 - 34 U/L - - -  ALT 0 - 55 U/L - - -   CBC Latest Ref Rng & Units 07/14/2019 11/16/2013 09/18/2013  WBC 4.0 - 10.5 K/uL 17.0(H) 9.2 7.8  Hemoglobin  13.0 - 17.0 g/dL 18.1(H) 16.1 16.6  Hematocrit 39.0 - 52.0 % 54.0(H) 46.8 48.5  Platelets 150 - 400 K/uL 345 279 271   Lipid Panel     Component Value Date/Time   CHOL 184 01/21/2012 1453   TRIG 191 (H) 01/21/2012 1453   HDL  31 (L) 01/21/2012 1453   CHOLHDL 5.9 01/21/2012 1453   VLDL 38 01/21/2012 1453   LDLCALC 115 (H) 01/21/2012 1453   HEMOGLOBIN A1C Lab Results  Component Value Date   HGBA1C 10.7 (A) 03/27/2018   MPG 272 (H) 05/19/2013   TSH No results for input(s): TSH in the last 8760 hours. BNP (last 3 results) No results for input(s): BNP in the last 8760 hours.  Medications and allergies  No Known Allergies   Prior to Admission medications   Medication Sig Start Date End Date Taking? Authorizing Provider  Empagliflozin-metFORMIN HCl 12.08-998 MG TABS Take 1 tablet by mouth 2 (two) times daily. 06/20/19  Yes Velna Ochs, MD  gabapentin (NEURONTIN) 300 MG capsule Take 1 capsule (300 mg total) by mouth at bedtime. Patient taking differently: Take 300 mg by mouth 2 (two) times daily.  06/05/19  Yes Cameron Sprang, MD  ibuprofen (ADVIL,MOTRIN) 800 MG tablet Take 1 tablet (800 mg total) by mouth 3 (three) times daily. Patient taking differently: Take 800 mg by mouth every 8 (eight) hours as needed for moderate pain.  10/31/17  Yes Burky, Lanelle Bal B, NP  oxyCODONE-acetaminophen (PERCOCET/ROXICET) 5-325 MG tablet TAKE 1 TABLET BY MOUTH AT BEDTIME. MAY REPEAT 1 ADDITIONAL TABLET AS NEEDED Patient taking differently: Take 1 tablet by mouth See admin instructions. TAKE 1 TABLET BY MOUTH AT BEDTIME. MAY REPEAT 1 ADDITIONAL TABLET AS NEEDED 06/12/19  Yes Bruning, Ashlyn, PA-C  phenytoin (DILANTIN) 100 MG ER capsule Take 3 capsules every morning 03/08/19  Yes Cameron Sprang, MD    . sodium chloride 20 mL/hr (07/14/19 2343)    Current Outpatient Medications  Medication Instructions  . Empagliflozin-metFORMIN HCl 12.08-998 MG TABS 1 tablet, Oral, 2 times daily  . gabapentin  (NEURONTIN) 300 mg, Oral, Daily at bedtime  . ibuprofen (ADVIL) 800 mg, Oral, 3 times daily  . oxyCODONE-acetaminophen (PERCOCET/ROXICET) 5-325 MG tablet TAKE 1 TABLET BY MOUTH AT BEDTIME. MAY REPEAT 1 ADDITIONAL TABLET AS NEEDED  . phenytoin (DILANTIN) 100 MG ER capsule Take 3 capsules every morning    No intake/output data recorded. No intake/output data recorded.    Radiology:  DG Chest 2 View  Result Date: 07/14/2019 CLINICAL DATA:  Chest pain. EXAM: CHEST - 2 VIEW COMPARISON:  June 21, 2018 FINDINGS: The heart size and mediastinal contours are within normal limits. Both lungs are clear. The visualized skeletal structures are unremarkable. IMPRESSION: No active cardiopulmonary disease. Electronically Signed   By: Virgina Norfolk M.D.   On: 07/14/2019 22:02   MR Brain W Wo Contrast  Result Date: 07/13/2019 CLINICAL DATA:  Grade 2 astrocytoma with prior radiation treatment. Short-term follow-up. Progressive foot pain. EXAM: MRI HEAD WITHOUT AND WITH CONTRAST TECHNIQUE: Multiplanar, multiecho pulse sequences of the brain and surrounding structures were obtained without and with intravenous contrast. CONTRAST:  90m MULTIHANCE GADOBENATE DIMEGLUMINE 529 MG/ML IV SOLN COMPARISON:  Multiple prior MRIs of the head, most recently 05/18/2019 and 10/27/2018. FINDINGS: Brain: The 2 focal areas of enhancement which raise concern on the most recent study in the anterior right frontal lobe have both increased in size. The more lateral focus has increased from just under 3 mm to just over 4 mm. More inferior and medially the lesion the previously measured 2 mm now measures just over 3 mm. Minimal increase in T2 signal change about the more inferior lesion is noted compared with the June 26th exam. Subtle enhancement in the anterior left frontal lobe is stable and may be vascular.  No other distal enhancement is present. No restricted diffusion is associated with either lesion. The cystic foci in the right  frontal lobe are stable. Postoperative and post radiation changes are otherwise stable. Anterior periventricular white matter changes on the left likely related to prior treatment. Remote insults are again noted in the corpus callosum. The internal auditory canals are within normal limits. The brainstem and cerebellum are within normal limits. No other abnormal enhancement is present. Vascular: Flow is present in the major intracranial arteries. Skull and upper cervical spine: The craniocervical junction is normal. Upper cervical spine is within normal limits. Marrow signal is unremarkable. Sinuses/Orbits: Minimal fluid is present in the posterior left maxillary sinus, stable. The paranasal sinuses and mastoid air cells are otherwise clear. The globes and orbits are within normal limits. IMPRESSION: 1. The 2 focal areas of enhancement in the anterior right frontal lobe have both increased in size since the June 26th exam. This is concerning for progression of disease. 2. Stable post radiation changes in the right frontal lobe and corpus callosum. 3. No other acute intracranial abnormality. 4. Minimal fluid in the posterior left maxillary sinus is stable. Electronically Signed   By: San Morelle M.D.   On: 07/13/2019 16:47    Cardiac Studies:   EKG 07/14/2019: Normal sinus rhythm, no evidence of ischemia.  Compared to prior EKG done a few minutes ago, minimal ST elevation in V1 through V3 which does not meet STEMI criteria appears to have improved.  Assessment   1.  NSTEMI 2.  Diabetes mellitus type 2 uncontrolled with peripheral neuropathy 3.  Hyperlipidemia 4.  Tobacco use disorder 5.  Bifrontal astrocytoma.  Recommendations:   As dictated above, in view of markedly elevated serum troponin, suspicion for LAD stenosis, will proceed with urgent diagnostic and possible angioplasty.  Kept in mind will be his astrocytoma.  Further recommendations to follow.  I have met with his daughter and  explained risks and benefits.  We discussed regarding risks, benefits, alternatives to this including stress testing, CTA and continued medical therapy. Patient wants to proceed. Understands <1-2% risk of death, stroke, MI, urgent CABG, bleeding, infection, renal failure but not limited to these.   Adrian Prows, MD, [facility] 07/15/2019, 12:08 AM [facility]. [place] Pager: [phone] Office: [phone]

## 2019-07-15 NOTE — Progress Notes (Signed)
  Echocardiogram 2D Echocardiogram has been performed.  Jonathan Barker 07/15/2019, 11:50 AM

## 2019-07-16 ENCOUNTER — Other Ambulatory Visit (HOSPITAL_COMMUNITY): Payer: Self-pay | Admitting: Cardiology

## 2019-07-16 ENCOUNTER — Telehealth: Payer: Self-pay

## 2019-07-16 LAB — BASIC METABOLIC PANEL
Anion gap: 11 (ref 5–15)
BUN: 36 mg/dL — ABNORMAL HIGH (ref 6–20)
CO2: 18 mmol/L — ABNORMAL LOW (ref 22–32)
Calcium: 9.1 mg/dL (ref 8.9–10.3)
Chloride: 112 mmol/L — ABNORMAL HIGH (ref 98–111)
Creatinine, Ser: 0.67 mg/dL (ref 0.61–1.24)
GFR calc Af Amer: >60 mL/min (ref >60)
GFR calc non Af Amer: >60 mL/min (ref >60)
Glucose, Bld: 156 mg/dL — ABNORMAL HIGH (ref 70–99)
Potassium: 3.8 mmol/L (ref 3.5–5.1)
Sodium: 141 mmol/L (ref 135–145)

## 2019-07-16 LAB — CBC
HCT: 42.4 % (ref 39.0–52.0)
Hemoglobin: 14.5 g/dL (ref 13.0–17.0)
MCH: 30.6 pg (ref 26.0–34.0)
MCHC: 34.2 g/dL (ref 30.0–36.0)
MCV: 89.5 fL (ref 80.0–100.0)
Platelets: 299 10*3/uL (ref 150–400)
RBC: 4.74 MIL/uL (ref 4.22–5.81)
RDW: 12.2 % (ref 11.5–15.5)
WBC: 21.8 10*3/uL — ABNORMAL HIGH (ref 4.0–10.5)
nRBC: 0 % (ref 0.0–0.2)

## 2019-07-16 LAB — GLUCOSE, CAPILLARY
Glucose-Capillary: 146 mg/dL — ABNORMAL HIGH (ref 70–99)
Glucose-Capillary: 204 mg/dL — ABNORMAL HIGH (ref 70–99)
Glucose-Capillary: 154 mg/dL — ABNORMAL HIGH (ref 70–99)

## 2019-07-16 LAB — MAGNESIUM: Magnesium: 1.9 mg/dL (ref 1.7–2.4)

## 2019-07-16 LAB — POCT ACTIVATED CLOTTING TIME
Activated Clotting Time: 246 seconds
Activated Clotting Time: 318 seconds

## 2019-07-16 MED ORDER — POTASSIUM CHLORIDE CRYS ER 20 MEQ PO TBCR
20.0000 meq | EXTENDED_RELEASE_TABLET | Freq: Once | ORAL | Status: AC
  Administered 2019-07-16: 20 meq via ORAL
  Filled 2019-07-16: qty 1

## 2019-07-16 MED ORDER — METOPROLOL TARTRATE 50 MG PO TABS: 50.0000 mg | ORAL_TABLET | Freq: Two times a day (BID) | ORAL | 3 refills | Status: DC

## 2019-07-16 MED ORDER — NITROGLYCERIN 0.4 MG SL SUBL: 0.4000 mg | SUBLINGUAL_TABLET | SUBLINGUAL | 3 refills | Status: AC | PRN

## 2019-07-16 MED ORDER — LISINOPRIL 5 MG PO TABS: 5.0000 mg | ORAL_TABLET | Freq: Every day | ORAL | 3 refills | Status: DC

## 2019-07-16 MED ORDER — BLOOD GLUCOSE MONITOR KIT: PACK | 0 refills | Status: DC

## 2019-07-16 MED ORDER — MAGNESIUM OXIDE 400 (241.3 MG) MG PO TABS: 200.0000 mg | ORAL_TABLET | Freq: Two times a day (BID) | ORAL | 1 refills | Status: DC

## 2019-07-16 MED ORDER — METOPROLOL TARTRATE 50 MG PO TABS: ORAL_TABLET | ORAL | 3 refills | Status: DC

## 2019-07-16 MED ORDER — INSULIN STARTER KIT- PEN NEEDLES (ENGLISH)
1.0000 | Freq: Once | Status: DC
  Filled 2019-07-16: qty 1

## 2019-07-16 MED ORDER — MAGNESIUM OXIDE 400 (241.3 MG) MG PO TABS
200.0000 mg | ORAL_TABLET | Freq: Two times a day (BID) | ORAL | Status: DC
  Administered 2019-07-16: 200 mg via ORAL
  Filled 2019-07-16: qty 1

## 2019-07-16 MED ORDER — ASPIRIN 81 MG PO CHEW: 81.0000 mg | CHEWABLE_TABLET | Freq: Every day | ORAL | 3 refills | Status: AC

## 2019-07-16 MED ORDER — ATORVASTATIN CALCIUM 80 MG PO TABS: 80.0000 mg | ORAL_TABLET | Freq: Every day | ORAL | 3 refills | Status: DC

## 2019-07-16 MED ORDER — CLOPIDOGREL BISULFATE 75 MG PO TABS: 75.0000 mg | ORAL_TABLET | Freq: Every day | ORAL | 3 refills | Status: DC

## 2019-07-16 MED ORDER — BASAGLAR KWIKPEN 100 UNIT/ML ~~LOC~~ SOPN: 10.0000 [IU] | PEN_INJECTOR | Freq: Every day | SUBCUTANEOUS | 0 refills | Status: DC

## 2019-07-16 MED ORDER — PEN NEEDLES 32G X 4 MM MISC: 1.0000 "pen " | 11 refills | Status: DC

## 2019-07-16 MED ORDER — LANTUS SOLOSTAR 100 UNIT/ML ~~LOC~~ SOPN: 10.0000 [IU] | PEN_INJECTOR | Freq: Every day | SUBCUTANEOUS | 0 refills | Status: DC

## 2019-07-16 MED ORDER — LIVING WELL WITH DIABETES BOOK
Freq: Once | Status: AC
  Filled 2019-07-16: qty 1

## 2019-07-16 MED FILL — PENTIPS 31G X 8 MM MISC: 31G X 8 MM | 100 days supply | Qty: 100 | Fill #0

## 2019-07-16 MED FILL — ATORVASTATIN CALCIUM 80 MG: 80 | 90 days supply | Qty: 90 | Fill #0

## 2019-07-16 MED FILL — NITROGLYCERIN 0.4 MG TAB SL: 0.4 | 28 days supply | Qty: 75 | Fill #0

## 2019-07-16 MED FILL — ASPIRIN LOW DOSE 81 MG CHEW: 81 | 90 days supply | Qty: 90 | Fill #0

## 2019-07-16 MED FILL — LISINOPRIL 5 MG TABS: 5 | 90 days supply | Qty: 90 | Fill #0

## 2019-07-16 MED FILL — CLOPIDOGREL 75 MG TABLET: 75 | 90 days supply | Qty: 90 | Fill #0

## 2019-07-16 MED FILL — BASAGLAR 100 UNIT/ML KWIKPE: 100 | 30 days supply | Qty: 3 | Fill #0

## 2019-07-16 MED FILL — METOPROLOL TARTRATE 50 MG T: 50 | 90 days supply | Qty: 180 | Fill #0

## 2019-07-16 MED FILL — MAGNESIUM OXIDE 400 MG TABS: 400 | 90 days supply | Qty: 90 | Fill #0

## 2019-07-16 MED FILL — Nitroglycerin IV Soln 100 MCG/ML in D5W: INTRA_ARTERIAL | Qty: 10 | Status: AC

## 2019-07-16 NOTE — Progress Notes (Signed)
Managed to give insulin injection on his right arm with good technique. Claimed to have done this before. Not comfortable to inject into his abdomen.

## 2019-07-16 NOTE — Discharge Instructions (Signed)
Restart metformin on 3/17 AM (at least 48 hours from cardiac cath)

## 2019-07-16 NOTE — Telephone Encounter (Signed)
Called patient for TOC, NA, LMAM to call back.

## 2019-07-16 NOTE — Progress Notes (Signed)
Received message from cardiology requesting assistance with outpatient insulin. Glucometer order placed on discharge. I have added Lantus 10 units QHS to his discharge orders. Please have him schedule hospital follow up with our clinic next available [phone]. Thank you!

## 2019-07-16 NOTE — Discharge Summary (Signed)
Physician Discharge Summary  Patient ID: Jonathan Barker MRN: [id number] DOB/AGE: [birth year] 54 y.o.  Admit date: 07/14/2019 Discharge date: 07/16/2019  Primary Discharge Diagnosis: Non-STEMI. Nonsustained ventricular tachycardia Established coronary artery disease status post angioplasty and stenting Status post angioplasty and stenting Ischemic cardiomyopathy Uncontrolled diabetes mellitus type 2 with coronary disease Hyperlipidemia with diabetes mellitus type 2 Tobacco use disorder  Secondary Discharge Diagnosis: Bifrontal astrocytoma  Hospital Course:  54 year old Caucasian gentleman whose past medical history and cardiovascular risk factors include: History of astrocytoma, non-STEMI, nonsustained ventricular tachycardia, established coronary artery disease a status post angioplasty and stenting, ischemic cardiomyopathy, uncontrolled diabetes mellitus type 2, hyperlipidemia, tobacco use presented to the hospital with chest discomfort.  Admitted to the hospital when he presented to the emergency room with 7 out of 10 intensity chest pain that started at 10 AM this morning on 07/14/2019.  In the emergency room initially had mild ST elevation in anterior leads, was started on IV heparin and aspirin was given and although EKG did not meet the criteria for STEMI, in view of ongoing chest pain and markedly elevated serum troponin, STEMI was activated.    During his left heart catheterization patient underwent successful PTCA and stenting.  Please refer to the left heart catheterization report for additional details.  Patient was monitored subsequently in the hospitalization has done well.  He has been started on guideline directed medical therapy which needs to be further uptitrated as outpatient as his hemodynamics and laboratory values allow.  His medications were requested to be delivered to his room prior to discharge.   He needs to follow-up with his primary care physician in the next  week for the management of diabetes.  Patient is asked to resume his antiglycemic medications at the time of discharge.  Glucometer was also provided so that he can monitor his blood sugars and take the log with him to his PCP appointment so his medications can further be titrated.  Patient is asked to follow-up with Dr. Adrian Prows or myself as part of transition of care in 7 days for medication titration.  As part of the discharge I spoke to the diabetes management care team, nursing staff, patient's daughter.   Discharge Exam: Blood pressure 100/79, pulse (!) 117, temperature 97.9 F (36.6 C), temperature source Oral, resp. rate (!) 21, height 5' 11"  (1.803 m), weight 72.3 kg, SpO2 98 %.   Constitutional: He is oriented to person, place, and time. He appears well-developed and well-nourished. No distress.  HENT:  Head: Normocephalic and atraumatic.  Eyes: Right eye exhibits no discharge. Left eye exhibits no discharge. No scleral icterus.  Neck: No JVD present. No tracheal deviation present.  Cardiovascular: Normal rate and regular rhythm. Exam reveals no friction rub.  No murmur heard. Pulmonary/Chest: Breath sounds normal. No respiratory distress. He has no wheezes. He has no rales.  Abdominal: Soft. Bowel sounds are normal. He exhibits no distension. There is no abdominal tenderness. There is no rebound and no guarding.  Musculoskeletal:        General: No edema. Normal range of motion.     Cervical back: Normal range of motion and neck supple.  Neurological: He is alert and oriented to person, place, and time. No cranial nerve deficit.  Skin: Skin is dry. No rash noted. He is not diaphoretic. No erythema.  Psychiatric: He has a normal mood and affect. His behavior is normal. Judgment and thought content normal.   Recommendations on discharge:  Patient and his daughter  voiced understanding in regards to the importance of medication compliance. Called the patient's daughter Overton Mam at  [phone] to update her with the plan of care as well. Needs close follow-up with PCP in the next 1 week for diabetes management. Needs close follow-up with Dr. Adrian Prows or myself in 1 week for his newly diagnosed CAD and ischemic cardiomyopathy. Medications will be delivered to his room prior to discharge.  Cardiac Studies: EKG 07/14/2019: Normal sinus rhythm, no evidence of ischemia. Compared to prior EKG done a few minutes ago, minimal ST elevation in V1 through V3 which does not meet STEMI criteria appears to have improved.  Echocardiogram: LVEF 30-35%, moderate to severely decreased function, with regional wall motion abnormalities, grade 1 diastolic impairment, no mural thrombus, no pulmonary hypertension.  Left Heart Catheterization 07/15/19: LV: Mid to distal anterior, anterolateral and apical akinesis, EF 35 to at most 40%. Hand contrast injection, not well visualized. Normal LVEDP. RCA: Dominant, mild diffuse disease. Left main: Mildly calcified. LAD: Diffusely diseased proximal segment, mid segment had a 90% stenosis. Mid to distal segment has minimal disease and apical LAD had 99% stenosis. Successful PTCA and stenting of the proximal/ostial and mid LAD with implantation of a long 3.0 x 38 mm resolute Onyx DES postdilated with 3.5 x 12 mm noncompliant sapphire balloon. 90% stenosis reduced to 0% with TIMI-3 to TIMI-3 flow. Apical LAD stenosis left alone. Ramus intermediate: Large vessel, ostium has ulcerated appearing 99% stenosis. Upon angioplasty realized it was also calcified and probably chronic. Successful PTCA and stenting with 2 overlapping 2.5 x 15 and distally 2.5 x 13 mm resolute Onyx DES. 9 9% stenosis reduced to 0% with TIMI-3 to TIMI-3 flow. Circumflex: Moderate to large vessel, proximal to mid segment is diffusely diseased 30%. Large OM1 with proximal long 60% to at most 70% stenosis.  Labs:   Lab Results  Component Value Date   WBC 21.8 (H) 07/16/2019    HGB 14.5 07/16/2019   HCT 42.4 07/16/2019   MCV 89.5 07/16/2019   PLT 299 07/16/2019    Recent Labs  Lab 07/16/19 0228  NA 141  K 3.8  CL 112*  CO2 18*  BUN 36*  CREATININE 0.67  CALCIUM 9.1  GLUCOSE 156*    Lipid Panel     Component Value Date/Time   CHOL 244 (H) 07/14/2019 2336   TRIG 126 07/14/2019 2336   HDL 43 07/14/2019 2336   CHOLHDL 5.7 07/14/2019 2336   VLDL 25 07/14/2019 2336   LDLCALC 176 (H) 07/14/2019 2336   HEMOGLOBIN A1C Lab Results  Component Value Date   HGBA1C 10.6 (H) 07/14/2019   MPG 257.52 07/14/2019    Radiology: DG Chest 2 View  Result Date: 07/14/2019 CLINICAL DATA:  Chest pain. EXAM: CHEST - 2 VIEW COMPARISON:  June 21, 2018 FINDINGS: The heart size and mediastinal contours are within normal limits. Both lungs are clear. The visualized skeletal structures are unremarkable. IMPRESSION: No active cardiopulmonary disease. Electronically Signed   By: Virgina Norfolk M.D.   On: 07/14/2019 22:02   MR Brain W Wo Contrast  Result Date: 07/13/2019 CLINICAL DATA:  Grade 2 astrocytoma with prior radiation treatment. Short-term follow-up. Progressive foot pain. EXAM: MRI HEAD WITHOUT AND WITH CONTRAST TECHNIQUE: Multiplanar, multiecho pulse sequences of the brain and surrounding structures were obtained without and with intravenous contrast. CONTRAST:  2m MULTIHANCE GADOBENATE DIMEGLUMINE 529 MG/ML IV SOLN COMPARISON:  Multiple prior MRIs of the head, most recently 05/18/2019 and 10/27/2018. FINDINGS: Brain: The 2  focal areas of enhancement which raise concern on the most recent study in the anterior right frontal lobe have both increased in size. The more lateral focus has increased from just under 3 mm to just over 4 mm. More inferior and medially the lesion the previously measured 2 mm now measures just over 3 mm. Minimal increase in T2 signal change about the more inferior lesion is noted compared with the June 26th exam. Subtle enhancement in the  anterior left frontal lobe is stable and may be vascular. No other distal enhancement is present. No restricted diffusion is associated with either lesion. The cystic foci in the right frontal lobe are stable. Postoperative and post radiation changes are otherwise stable. Anterior periventricular white matter changes on the left likely related to prior treatment. Remote insults are again noted in the corpus callosum. The internal auditory canals are within normal limits. The brainstem and cerebellum are within normal limits. No other abnormal enhancement is present. Vascular: Flow is present in the major intracranial arteries. Skull and upper cervical spine: The craniocervical junction is normal. Upper cervical spine is within normal limits. Marrow signal is unremarkable. Sinuses/Orbits: Minimal fluid is present in the posterior left maxillary sinus, stable. The paranasal sinuses and mastoid air cells are otherwise clear. The globes and orbits are within normal limits. IMPRESSION: 1. The 2 focal areas of enhancement in the anterior right frontal lobe have both increased in size since the June 26th exam. This is concerning for progression of disease. 2. Stable post radiation changes in the right frontal lobe and corpus callosum. 3. No other acute intracranial abnormality. 4. Minimal fluid in the posterior left maxillary sinus is stable. Electronically Signed   By: San Morelle M.D.   On: 07/13/2019 16:47   CARDIAC CATHETERIZATION  Result Date: 07/15/2019 Left Heart Catheterization 07/15/19: LV: Mid to distal anterior, anterolateral and apical akinesis, EF 35 to at most 40%.  Hand contrast injection, not well visualized.  Normal LVEDP. RCA: Dominant, mild diffuse disease. Left main: Mildly calcified. LAD: Diffusely diseased proximal segment, mid segment had a 90% stenosis.  Mid to distal segment has minimal disease and apical LAD had 99% stenosis.  Successful PTCA and stenting of the proximal/ostial and  mid LAD with implantation of a long 3.0 x 38 mm resolute Onyx DES postdilated with 3.5 x 12 mm noncompliant sapphire balloon.  90% stenosis reduced to 0% with TIMI-3 to TIMI-3 flow.  Apical LAD stenosis left alone. Ramus intermediate: Large vessel, ostium has ulcerated appearing 99% stenosis.  Upon angioplasty realized it was also calcified and probably chronic.  Successful PTCA and stenting with 2 overlapping 2.5 x 15 and distally 2.5 x 13 mm resolute Onyx DES.  9 9% stenosis reduced to 0% with TIMI-3 to TIMI-3 flow. Circumflex: Moderate to large vessel, proximal to mid segment is diffusely diseased 30%.  Large OM1 with proximal long 60% to at most 70% stenosis. Recommendation: Patient will be started on low-dose beta-blocker for now due to poor tolerance and if blood pressure is stable will escalate the dose.  He was also started on low-dose ACE inhibitor's and high intensity high-dose atorvastatin.  He will receive aspirin indefinitely and Brilinta for at least 1 year in view of ACS. Patient has astrocytoma and abnormal MRI, we will have to discuss with his oncologist regarding any contraindication for antiplatelet use.  I did not think surgery would be more appropriate in the situation where high-dose heparin had to be utilized and potential for intracranial bleeding.  Also patient had nonsustained VT just prior to angioplasty while we were reviewing the angiogram and hence the lesion was felt to be very unstable when I decided to proceed with angioplasty.  ECHOCARDIOGRAM COMPLETE  Result Date: 07/15/2019    ECHOCARDIOGRAM REPORT   Patient Name:   DEANE WATTENBARGER [facility] Date of Exam: 07/15/2019 Medical Rec #:  [id number]          Height:       71.0 in Accession #:    [id number]         Weight:       172.0 lb Date of Birth:  December 01, 1965          BSA:          1.977 m Patient Age:    66 years           BP:           112/71 mmHg Patient Gender: M                  HR:           92 bpm. Exam Location:  Inpatient  Procedure: 2D Echo Indications:     R07.9* Chest pain, unspecified; I25.700 Atherosclerosis of                  coronary artery bypass graft(s), unspecified, with unstable                  angina pectoris  History:         Patient has no prior history of Echocardiogram examinations.  Sonographer:     Roseanna Rainbow RDCS Referring Phys:  [place] Diagnosing Phys: Adrian Prows MD IMPRESSIONS  1. Left ventricular ejection fraction, by estimation, is 30 to 35%. The left ventricle has moderate to severely decreased function. The left ventricle demonstrates regional wall motion abnormalities (see scoring diagram/findings for description). There is mild left ventricular hypertrophy. Left ventricular diastolic parameters are consistent with Grade I diastolic dysfunction (impaired relaxation). Entire mid to distal anterior and antero septum and inferoseptum and apex is akinetic. No mural thrombus.  2. Right ventricular systolic function is normal. The right ventricular size is normal. There is normal pulmonary artery systolic pressure.  3. The mitral valve is normal in structure. Trivial mitral valve regurgitation.  4. The aortic valve is tricuspid. Aortic valve regurgitation is not visualized. Mild aortic valve sclerosis is present, with no evidence of aortic valve stenosis.  5. The inferior vena cava is normal in size with greater than 50% respiratory variability, suggesting right atrial pressure of 3 mmHg. FINDINGS  Left Ventricle: Left ventricular ejection fraction, by estimation, is 30 to 35%. The left ventricle has moderate to severely decreased function. The left ventricle demonstrates regional wall motion abnormalities. The left ventricular internal cavity size was normal in size. There is mild left ventricular hypertrophy. Left ventricular diastolic parameters are consistent with Grade I diastolic dysfunction (impaired relaxation). Normal left ventricular filling pressure.  LV Wall Scoring: The mid and distal anterior  wall, apical lateral segment, mid anterolateral segment, mid inferoseptal segment, apical septal segment, and apex are akinetic. Entire mid to distal anterior and antero septum and inferoseptum and apex is akinetic. No mural thrombus. Right Ventricle: The right ventricular size is normal. No increase in right ventricular wall thickness. Right ventricular systolic function is normal. There is normal pulmonary artery systolic pressure. The tricuspid regurgitant velocity is 2.46 m/s, and  with an assumed right atrial pressure of  3 mmHg, the estimated right ventricular systolic pressure is 03.5 mmHg. Left Atrium: Left atrial size was normal in size. Right Atrium: Right atrial size was normal in size. Pericardium: There is no evidence of pericardial effusion. Mitral Valve: The mitral valve is normal in structure. Trivial mitral valve regurgitation. Tricuspid Valve: The tricuspid valve is normal in structure. Tricuspid valve regurgitation is mild. Aortic Valve: The aortic valve is tricuspid. Aortic valve regurgitation is not visualized. Mild aortic valve sclerosis is present, with no evidence of aortic valve stenosis. Pulmonic Valve: The pulmonic valve was grossly normal. Pulmonic valve regurgitation is trivial. Aorta: The aortic root is normal in size and structure. Venous: The inferior vena cava is normal in size with greater than 50% respiratory variability, suggesting right atrial pressure of 3 mmHg. IAS/Shunts: No atrial level shunt detected by color flow Doppler.  LEFT VENTRICLE PLAX 2D LVIDd:         4.80 cm      Diastology LVIDs:         2.90 cm      LV e' medial:   5.77 cm/s LV PW:         1.10 cm      LV E/e' medial: 9.4 LV IVS:        1.30 cm LVOT diam:     1.70 cm LV SV:         34 LV SV Index:   17 LVOT Area:     2.27 cm  LV Volumes (MOD) LV vol d, MOD A2C: 114.0 ml LV vol d, MOD A4C: 129.0 ml LV vol s, MOD A2C: 71.2 ml LV vol s, MOD A4C: 74.5 ml LV SV MOD A2C:     42.8 ml LV SV MOD A4C:     129.0 ml LV SV  MOD BP:      49.3 ml RIGHT VENTRICLE            IVC RV S prime:     6.85 cm/s  IVC diam: 1.40 cm LEFT ATRIUM             Index       RIGHT ATRIUM          Index LA diam:        1.90 cm 0.96 cm/m  RA Area:     8.08 cm LA Vol (A2C):   35.2 ml 17.80 ml/m RA Volume:   16.10 ml 8.14 ml/m LA Vol (A4C):   16.7 ml 8.45 ml/m LA Biplane Vol: 26.2 ml 13.25 ml/m  AORTIC VALVE             PULMONIC VALVE LVOT Vmax:   94.60 cm/s  PV Vmax:       2.46 m/s LVOT Vmean:  68.800 cm/s PV Peak grad:  24.2 mmHg LVOT VTI:    0.151 m  AORTA Ao Root diam: 3.80 cm MITRAL VALVE               TRICUSPID VALVE MV Area (PHT): 5.38 cm    TR Peak grad:   24.2 mmHg MV Decel Time: 141 msec    TR Vmax:        246.00 cm/s MV E velocity: 54.00 cm/s MV A velocity: 69.80 cm/s  SHUNTS MV E/A ratio:  0.77        Systemic VTI:  0.15 m  Systemic Diam: 1.70 cm Adrian Prows MD Electronically signed by Adrian Prows MD Signature Date/Time: 07/15/2019/1:58:53 PM    Final     FOLLOW UP PLANS AND APPOINTMENTS Discharge Instructions    Amb Referral to Cardiac Rehabilitation   Complete by: As directed    Diagnosis:  NSTEMI Coronary Stents     After initial evaluation and assessments completed: Virtual Based Care may be provided alone or in conjunction with Phase 2 Cardiac Rehab based on patient barriers.: Yes     Allergies as of 07/16/2019   No Known Allergies     Medication List    STOP taking these medications   ibuprofen 800 MG tablet Commonly known as: ADVIL     TAKE these medications   aspirin 81 MG chewable tablet Chew 1 tablet (81 mg total) by mouth daily. Start taking on: July 17, 2019   atorvastatin 80 MG tablet Commonly known as: LIPITOR Take 1 tablet (80 mg total) by mouth daily at 6 PM.   blood glucose meter kit and supplies Kit Dispense based on patient and insurance preference. Use up to four times daily as directed. (FOR ICD-9 250.00, 250.01).   clopidogrel 75 MG tablet Commonly known as:  PLAVIX Take 1 tablet (75 mg total) by mouth daily. Start taking on: July 17, 2019   Empagliflozin-metFORMIN HCl 12.08-998 MG Tabs Take 1 tablet by mouth 2 (two) times daily.   gabapentin 300 MG capsule Commonly known as: NEURONTIN Take 1 capsule (300 mg total) by mouth at bedtime. What changed: when to take this   lisinopril 5 MG tablet Commonly known as: ZESTRIL Take 1 tablet (5 mg total) by mouth daily. Start taking on: July 17, 2019   magnesium oxide 400 (241.3 Mg) MG tablet Commonly known as: MAG-OX Take 0.5 tablets (200 mg total) by mouth 2 (two) times daily.   metoprolol tartrate 50 MG tablet Commonly known as: LOPRESSOR Take 1 tablet (50 mg total) by mouth 2 (two) times daily. Hold if systolic blood pressure (top blood pressure number) less than 100 mmHg or heart rate less than 60 bpm (pulse).   nitroGLYCERIN 0.4 MG SL tablet Commonly known as: NITROSTAT Place 1 tablet (0.4 mg total) under the tongue every 5 (five) minutes x 3 doses as needed for chest pain.   oxyCODONE-acetaminophen 5-325 MG tablet Commonly known as: PERCOCET/ROXICET TAKE 1 TABLET BY MOUTH AT BEDTIME. MAY REPEAT 1 ADDITIONAL TABLET AS NEEDED What changed:   how much to take  how to take this  when to take this   phenytoin 100 MG ER capsule Commonly known as: DILANTIN Take 3 capsules every morning      Follow-up Information    Jasher Barkan, DO. Schedule an appointment as soon as possible for a visit.   Specialties: Cardiology, Radiology, Vascular Surgery Contact information: [address] [place] [address] [phone]          Total time spent on patient's discharge was 45 minutes.  Rex Kras, DO, [facility]. [place] Office: [phone]

## 2019-07-16 NOTE — Progress Notes (Signed)
The patient Jonathan Barker has current phenytoin level of  Phenytoin Lvl  Date Value Ref Range Status  07/15/2019 <2.5 (L) 10.0 - 20.0 ug/mL Final    Comment:    Performed at [facility], [place] [address] [place], [place] [address]  08/13/2015 <2.5 (L) 10.0 - 20.0 ug/mL Final    Other pertinent lab values include: No components found for: LABALBU Creatinine  Date Value Ref Range Status  01/16/2014 1.1 0.7 - 1.3 mg/dL Final   Creatinine, Ser  Date Value Ref Range Status  07/16/2019 0.67 0.61 - 1.24 mg/dL Final   No medication adjustments had been made upon admission. Per patient last dose was on 07/14/2019.   Discussed with Internal Medicine, would recommend phenytoin level at next outpatient appointment and compliance was encouraged.  Thanks,  Antonietta Jewel, PharmD, BCCCP Clinical Pharmacist  Phone: [phone]  Please check AMION for all [facility] phone numbers After 10:00 PM, call [place] [phone]

## 2019-07-16 NOTE — Progress Notes (Signed)
Discharged home accompanied by daughter, discharge instructions given to pt. Belongings taken home 

## 2019-07-16 NOTE — Progress Notes (Signed)
CARDIAC REHAB PHASE I   PRE:  Rate/Rhythm: 112 ST  BP:  Supine: 102/86  Sitting:   Standing:    SaO2: 97%RA  MODE:  Ambulation: 340 ft   POST:  Rate/Rhythm: 138 ST  BP:  Supine:   Sitting: 109/84  Standing:    SaO2: 99%RA 0935-1125 Pt walked 340 ft on RA with minimal asst. HR elevated to 138. No CP. Tolerated well. Back to bed after walk. MI and CHF education completed with pt and daughter who voiced understanding. Gave CHF booklet and MI booklet. Due to low EF discussed 2000 mg sodium restriction and daily weights. Reviewed signs/symptoms of when to call MD with CHF monitoring. Reviewed MI restrictions, NTG use, importance of brililnta with stent, walking for ex, smoking cessation and trying to keep A1C between 6 to 7. Pt stated he may be able to quit smoking. Gave smoking handout. Pt given heart healthy,diabetic and low sodium diets. Pt has been drinking regular [facility] and encouraged to try to cut back. Discussed CRP 2 and referred to [place]. No smart phone for Virtual APP.   Graylon Good, RN BSN  07/16/2019 11:17 AM

## 2019-07-16 NOTE — Progress Notes (Addendum)
Inpatient Diabetes Program Recommendations  AACE/ADA: New Consensus Statement on Inpatient Glycemic Control (2015)  Target Ranges:  Prepandial:   less than 140 mg/dL      Peak postprandial:   less than 180 mg/dL (1-2 hours)      Critically ill patients:  140 - 180 mg/dL   Lab Results  Component Value Date   GLUCAP 154 (H) 07/16/2019   HGBA1C 10.6 (H) 07/14/2019    Review of Glycemic Control  Results for ALMON, WHITFORD (MRN [id number]) as of 07/16/2019 11:56  Ref. Range 07/15/2019 11:46 07/15/2019 16:35 07/15/2019 20:45 07/16/2019 06:20 07/16/2019 06:54 07/16/2019 10:35  Glucose-Capillary Latest Ref Range: 70 - 99 mg/dL 254 (H) 177 (H) 140 (H) 146 (H) 204 (H) 154 (H)    Diabetes history: DM2 Outpatient Diabetes medications: Empaglitflozin-Metformin 12.08-998 mg BID Current orders for Inpatient glycemic control: Novolog 0-15 TID + 0-5 QHS + Novolog 4 units meal coverage TID   Note: Spoke with patient and daughter at bedside.  Reviewed patient's current A1c of 10.6% (average blood sugar 257 over the last 2-3 months). Explained what a A1c is and what it measures. Also reviewed goal A1c with patient, importance of good glucose control @ home, and blood sugar goals.  Educated patient and daughter on insulin pen use at home. Reviewed contents of insulin flexpen starter kit. Reviewed all steps if insulin pen including attachment of needle, 2-unit air shot, dialing up dose, giving injection, removing needle, disposal of sharps, storage of unused insulin, disposal of insulin etc. Explained that each insulin pen has multiple doses and can last 3-4 weeks.  Patient able to provide successful return demonstration. Also reviewed troubleshooting with insulin pen. MD to give patient Rxs for insulin pens and insulin pen needles.  Educated patient on The Plate Method and importance of limiting CHO's.  We reviewed which foods contain CHO's.  He drinks regular [facility] and states he will start to mix with diet  and try to transition to diet.  Reviewed hypoglycemia and treatment.  He is to call PCP if CBG's consistently <149m/dl or > 2065mdl.  He is current with PCP, CaVelna Ochs Attached education to AVS.    MD-Please order -glucometer-Order#[id number]   Addendum- @ 1237-  Spoke with Dr. ToTerri Skains He wants patient to follow up with PCP this week.  He will order a meter but will not prescribe insulins at this time.  Called patient and explained that he really needs to make serious diet adjustments and this could possibly allow him to not require insulin.  He verbalizes understanding.     Addendum@1342 -Dr. GuPhilipp Ovensas ordered Lantus 10 units Daily.  [facility].  She is aware that he can start his Lantus tonight before bed and check blood sugars TID.  Reviewed hypoglycemia and treatment again.  SaSheridanives with her dad and is very helpful.    Thank you, JeReche DixonRN, BSN Diabetes Coordinator Inpatient Diabetes Program [phone] pager from 8a-5p)

## 2019-07-17 ENCOUNTER — Other Ambulatory Visit: Payer: Self-pay

## 2019-07-17 ENCOUNTER — Telehealth: Payer: Self-pay

## 2019-07-17 NOTE — Telephone Encounter (Signed)
Spoke with patient to remind of telephone encounter on 07/18/19. Daughter stated her father had a heart attack on Saturday and was just released home from the hospital. She stated he had 2 stents placed while there. Patient seems to be doing well as can be at this time.

## 2019-07-18 ENCOUNTER — Ambulatory Visit
Admission: RE | Admit: 2019-07-18 | Discharge: 2019-07-18 | Disposition: A | Discharge status: Home / Self Care | Payer: Medicare Other | Source: Ambulatory Visit | Attending: Urology | Admitting: Urology

## 2019-07-18 ENCOUNTER — Telehealth (HOSPITAL_COMMUNITY): Payer: Self-pay

## 2019-07-18 ENCOUNTER — Telehealth: Payer: Self-pay

## 2019-07-18 ENCOUNTER — Other Ambulatory Visit: Payer: Self-pay | Admitting: Urology

## 2019-07-18 DIAGNOSIS — C719 Malignant neoplasm of brain, unspecified: Secondary | ICD-10-CM

## 2019-07-18 DIAGNOSIS — F17210 Nicotine dependence, cigarettes, uncomplicated: Secondary | ICD-10-CM | POA: Diagnosis not present

## 2019-07-18 DIAGNOSIS — Z08 Encounter for follow-up examination after completed treatment for malignant neoplasm: Secondary | ICD-10-CM | POA: Diagnosis not present

## 2019-07-18 DIAGNOSIS — R519 Headache, unspecified: Secondary | ICD-10-CM | POA: Diagnosis not present

## 2019-07-18 DIAGNOSIS — Z85841 Personal history of malignant neoplasm of brain: Secondary | ICD-10-CM | POA: Diagnosis not present

## 2019-07-18 DIAGNOSIS — R9089 Other abnormal findings on diagnostic imaging of central nervous system: Secondary | ICD-10-CM | POA: Diagnosis not present

## 2019-07-18 MED ORDER — OXYCODONE-ACETAMINOPHEN 5-325 MG PO TABS: ORAL_TABLET | ORAL | 0 refills | Status: DC

## 2019-07-18 NOTE — Progress Notes (Signed)
Radiation Oncology         [phone]  Multidisciplinary Brain and Spine Oncology Clinic Follow-Up Visit Note  CC: Velna Ochs, MD  Velna Ochs, MD    ICD-10-CM   1. Astrocytoma brain tumor ([place])  C71.9         Diagnosis:   54 y.o.  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  Narrative:  Mr. Rodkey is a pleasant 54 y.o. gentleman who was treated for a primary brain tumor with surgical resection followed by conventional radiotherapy for his grade II astrocytoma, with concurrent Temodar. He has continued with chronic pain from a surgical dehiscence from his craniotomy and night time headaches, unchanged recently.  He has been followed closely in the multidisciplinary clinic, and was referred to Dr. Tana Coast at the [facility], due to an interval MRI scan change, concerning for progression in the summer of 2017. He met with her and she did not recommend treatment at that time. He has been followed with punctate foci along the previous resection and treatment site but subsequent disease surveillance scans have shown disease stability.  He has also continued in routine follow up with Dr. Delice Lesch in neurology, for management of his anticonvulsant therapy.  He has continued taking Dilantin 300 mg daily and reports that he has remained seizure-free since the time of surgery in 05/2013.  He was last seen with Dr. Delice Lesch in 03/2019.    Interval History:  Today, I spoke with the patient to conduct his routine scheduled 6 month follow up visit and review his recent follow up MRI brain results via telephone to spare the patient unnecessary potential exposure in the healthcare setting during the current COVID-19 pandemic.  The patient was notified in advance and gave permission to  proceed with this visit format.  His brain MRI from 05/18/19 showed a new 2 mm focus of enhancement within the anterior right frontal lobe white matter, within the treated region as well as an additional 66m enhancing focus within the right frontal lobe, more laterally at the gray-white junction, also new from prior MRI 10/27/2018.  Otherwise, stable postsurgical changes to the right frontal lobe with stable T2 hyperintensity within the right frontal lobe and also extending across the corpus callosum into the left frontal lobe, unchanged from prior MRI 10/27/2018. His imaging was personally reviewed by Dr. MTammi Klippeland Dr. VMickeal Skinnerwho recommended a short interval follow up MRI to confirm stability and if any evidence for clinical or radiographic change, would possibly need to proceed with biopsy for tissue confirmation.  His short interval MRI brain scan from 07/13/19 unfortunately shows a slight increase in size of the 2 focal areas of enhancement in the anterior right frontal lobe, concerning for progression of disease.  Otherwise, stable post radiation changes in the right frontal lobe and corpus callosum.  We discussed these findings today in detail as well as the consensus recommendation from multidisciplinary clinic for a follow-up visit with Dr. VMickeal Skinnerfor discussion regarding further evaluation and management.  Of note,  he remains on chronic narcotic pain medication for management of chronic headaches.   On review of systems, the patient reports that he is doing well overall.  He has not had any recent seizure activity and denies any change in frequency or intensity of headaches, new visual or auditory changes or  imbalance. He denies shortness of breath, cough, fevers, chills, night sweats, or unintended weight changes. He did have an episode of chest pain recently which prompted evaluation through the ED on 07/14/19 and resulted in admission and urgent cardiac catheterization with PTCA and stenting for  NSTEMI. He has recovered well and was discharged home on 07/16/19. He has some chronic neuropathy in his feet associated with his diabetes. He denies any bowel or bladder disturbances, and denies abdominal pain, nausea or vomiting. He denies any new musculoskeletal or joint aches or pains, new skin lesions or concerns. A complete review of systems is obtained and is otherwise negative.  Past Medical History:  Past Medical History:  Diagnosis Date  . Astrocytoma of frontal lobe ([place]) 05/31/2013  . DM (diabetes mellitus) ([place]) 08/09/2011   A1c at diagnosis = 12.4  . Family history of breast cancer   . GERD (gastroesophageal reflux disease) 08/09/2011  . Seizure ([place]) 05/19/2013  . Shortness of breath     Past Surgical History: Past Surgical History:  Procedure Laterality Date  . APPENDECTOMY    . BRAIN BIOPSY N/A 05/24/2013   Procedure: Craniotomy for open biopsy;  Surgeon: Winfield Cunas, MD;  Location: [place] [facility];  Service: Neurosurgery;  Laterality: N/A;  Craniotomy for open biopsy  . CORONARY/GRAFT ACUTE MI REVASCULARIZATION N/A 07/15/2019   Procedure: Coronary/Graft Acute MI Revascularization;  Surgeon: Adrian Prows, MD;  Location: [place] [facility];  Service: Cardiovascular;  Laterality: N/A;  . LEFT HEART CATH AND CORONARY ANGIOGRAPHY N/A 07/15/2019   Procedure: LEFT HEART CATH AND CORONARY ANGIOGRAPHY;  Surgeon: Adrian Prows, MD;  Location: [place] [facility];  Service: Cardiovascular;  Laterality: N/A;    Social History:  Social History   Socioeconomic History  . Marital status: Significant Other    Spouse name: Not on file  . Number of children: 2  . Years of education: Not on file  . Highest education level: Not on file  Occupational History    Employer: [employer]  Tobacco Use  . Smoking status: Current Every Day Smoker    Packs/day: 1.00    Years: 33.00    Pack years: 33.00    Types: Cigarettes  . Smokeless tobacco: Never Used  . Tobacco comment: Trying to  quit. Down to 1 ppd, 10/11/13 down to 1/2 ppd  Substance and Sexual Activity  . Alcohol use: Yes    Alcohol/week: 0.0 standard drinks    Comment: social  . Drug use: Yes    Types: Marijuana    Comment: Marijuana daily  . Sexual activity: Not on file  Other Topics Concern  . Not on file  Social History Narrative   [place]. He does not work now.    Right handed      Highest level of edu- 11th grade      Lives in one story home. Steps to enter home   Social Determinants of Health   Financial Resource Strain:   . Difficulty of Paying Living Expenses:   Food Insecurity:   . Worried About Charity fundraiser in the Last Year:   . Arboriculturist in the Last Year:   Transportation Needs:   . Film/video editor (Medical):   Marland Kitchen Lack of Transportation (Non-Medical):   Physical Activity:   . Days of Exercise per Week:   . Minutes of Exercise per Session:   Stress:   . Feeling of Stress :  Social Connections:   . Frequency of Communication with Friends and Family:   . Frequency of Social Gatherings with Friends and Family:   . Attends Religious Services:   . Active Member of Clubs or Organizations:   . Attends Archivist Meetings:   Marland Kitchen Marital Status:   Intimate Partner Violence:   . Fear of Current or Ex-Partner:   . Emotionally Abused:   Marland Kitchen Physically Abused:   . Sexually Abused:     Family History: Family History  Problem Relation Age of Onset  . Breast cancer Mother        dx in her 58s-60s  . Bone cancer Mother   . Diabetes Father   . Heart disease Father   . Diabetes Brother   . Diabetes Paternal Uncle     ALLERGIES:  has No Known Allergies.  Meds: Current Outpatient Medications  Medication Sig Dispense Refill  . aspirin 81 MG chewable tablet Chew 1 tablet (81 mg total) by mouth daily. 90 tablet 3  . atorvastatin (LIPITOR) 80 MG tablet Take 1 tablet (80 mg total) by mouth daily at 6 PM. 90 tablet 3  . blood glucose meter  kit and supplies KIT Dispense based on patient and insurance preference. Use up to four times daily as directed. (FOR ICD-9 250.00, 250.01). 1 each 0  . clopidogrel (PLAVIX) 75 MG tablet Take 1 tablet (75 mg total) by mouth daily. 90 tablet 3  . Empagliflozin-metFORMIN HCl 12.08-998 MG TABS Take 1 tablet by mouth 2 (two) times daily. 180 tablet 1  . gabapentin (NEURONTIN) 300 MG capsule Take 1 capsule (300 mg total) by mouth at bedtime. (Patient taking differently: Take 300 mg by mouth 2 (two) times daily. ) 30 capsule 11  . Insulin Glargine (BASAGLAR KWIKPEN) 100 UNIT/ML Inject 0.1 mLs (10 Units total) into the skin daily. 15 mL 0  . Insulin Pen Needle (PEN NEEDLES) 32G X 4 MM MISC 1 pen by Does not apply route as directed. 100 each 11  . lisinopril (ZESTRIL) 5 MG tablet Take 1 tablet (5 mg total) by mouth daily. 90 tablet 3  . magnesium oxide (MAG-OX) 400 (241.3 Mg) MG tablet Take 0.5 tablets (200 mg total) by mouth 2 (two) times daily. 90 tablet 1  . metoprolol tartrate (LOPRESSOR) 50 MG tablet Take 1 tablet twice daily- Hold if systolic BP (top blood pressure number) less than 100 mmHg or heart rate less than 60 bpm (pulse). 90 tablet 3  . nitroGLYCERIN (NITROSTAT) 0.4 MG SL tablet Place 1 tablet (0.4 mg total) under the tongue every 5 (five) minutes x 3 doses as needed for chest pain. 90 tablet 3  . oxyCODONE-acetaminophen (PERCOCET/ROXICET) 5-325 MG tablet TAKE 1 TABLET BY MOUTH AT BEDTIME. MAY REPEAT 1 ADDITIONAL TABLET AS NEEDED (Patient taking differently: Take 1 tablet by mouth See admin instructions. TAKE 1 TABLET BY MOUTH AT BEDTIME. MAY REPEAT 1 ADDITIONAL TABLET AS NEEDED) 90 tablet 0  . phenytoin (DILANTIN) 100 MG ER capsule Take 3 capsules every morning 270 capsule 3   No current facility-administered medications for this encounter.    Physical Findings: Wt Readings from Last 3 Encounters:  07/16/19 159 lb 6.3 oz (72.3 kg)  06/04/19 175 lb (79.4 kg)  03/08/19 183 lb (83 kg)    Temp Readings from Last 3 Encounters:  07/16/19 97.9 F (36.6 C) (Oral)  05/02/18 97.8 F (36.6 C) (Oral)  03/27/18 97.9 F (36.6 C) (Oral)   BP Readings from Last 3 Encounters:  07/16/19 100/79  05/02/18 (!) 133/92  03/27/18 (!) 129/93   Pulse Readings from Last 3 Encounters:  07/16/19 (!) 117  05/02/18 100  03/27/18 94   Unable to assess due to telephone follow up visit format.  Lab Findings: Lab Results  Component Value Date   WBC 21.8 (H) 07/16/2019   HGB 14.5 07/16/2019   HCT 42.4 07/16/2019   MCV 89.5 07/16/2019   PLT 299 07/16/2019   Radiographic Findings: DG Chest 2 View  Result Date: 07/14/2019 CLINICAL DATA:  Chest pain. EXAM: CHEST - 2 VIEW COMPARISON:  June 21, 2018 FINDINGS: The heart size and mediastinal contours are within normal limits. Both lungs are clear. The visualized skeletal structures are unremarkable. IMPRESSION: No active cardiopulmonary disease. Electronically Signed   By: Virgina Norfolk M.D.   On: 07/14/2019 22:02   MR Brain W Wo Contrast  Result Date: 07/13/2019 CLINICAL DATA:  Grade 2 astrocytoma with prior radiation treatment. Short-term follow-up. Progressive foot pain. EXAM: MRI HEAD WITHOUT AND WITH CONTRAST TECHNIQUE: Multiplanar, multiecho pulse sequences of the brain and surrounding structures were obtained without and with intravenous contrast. CONTRAST:  4m MULTIHANCE GADOBENATE DIMEGLUMINE 529 MG/ML IV SOLN COMPARISON:  Multiple prior MRIs of the head, most recently 05/18/2019 and 10/27/2018. FINDINGS: Brain: The 2 focal areas of enhancement which raise concern on the most recent study in the anterior right frontal lobe have both increased in size. The more lateral focus has increased from just under 3 mm to just over 4 mm. More inferior and medially the lesion the previously measured 2 mm now measures just over 3 mm. Minimal increase in T2 signal change about the more inferior lesion is noted compared with the June 26th exam.  Subtle enhancement in the anterior left frontal lobe is stable and may be vascular. No other distal enhancement is present. No restricted diffusion is associated with either lesion. The cystic foci in the right frontal lobe are stable. Postoperative and post radiation changes are otherwise stable. Anterior periventricular white matter changes on the left likely related to prior treatment. Remote insults are again noted in the corpus callosum. The internal auditory canals are within normal limits. The brainstem and cerebellum are within normal limits. No other abnormal enhancement is present. Vascular: Flow is present in the major intracranial arteries. Skull and upper cervical spine: The craniocervical junction is normal. Upper cervical spine is within normal limits. Marrow signal is unremarkable. Sinuses/Orbits: Minimal fluid is present in the posterior left maxillary sinus, stable. The paranasal sinuses and mastoid air cells are otherwise clear. The globes and orbits are within normal limits. IMPRESSION: 1. The 2 focal areas of enhancement in the anterior right frontal lobe have both increased in size since the June 26th exam. This is concerning for progression of disease. 2. Stable post radiation changes in the right frontal lobe and corpus callosum. 3. No other acute intracranial abnormality. 4. Minimal fluid in the posterior left maxillary sinus is stable. Electronically Signed   By: CSan MorelleM.D.   On: 07/13/2019 16:47   CARDIAC CATHETERIZATION  Result Date: 07/15/2019 Left Heart Catheterization 07/15/19: LV: Mid to distal anterior, anterolateral and apical akinesis, EF 35 to at most 40%.  Hand contrast injection, not well visualized.  Normal LVEDP. RCA: Dominant, mild diffuse disease. Left main: Mildly calcified. LAD: Diffusely diseased proximal segment, mid segment had a 90% stenosis.  Mid to distal segment has minimal disease and apical LAD had 99% stenosis.  Successful PTCA and stenting of  the  proximal/ostial and mid LAD with implantation of a long 3.0 x 38 mm resolute Onyx DES postdilated with 3.5 x 12 mm noncompliant sapphire balloon.  90% stenosis reduced to 0% with TIMI-3 to TIMI-3 flow.  Apical LAD stenosis left alone. Ramus intermediate: Large vessel, ostium has ulcerated appearing 99% stenosis.  Upon angioplasty realized it was also calcified and probably chronic.  Successful PTCA and stenting with 2 overlapping 2.5 x 15 and distally 2.5 x 13 mm resolute Onyx DES.  9 9% stenosis reduced to 0% with TIMI-3 to TIMI-3 flow. Circumflex: Moderate to large vessel, proximal to mid segment is diffusely diseased 30%.  Large OM1 with proximal long 60% to at most 70% stenosis. Recommendation: Patient will be started on low-dose beta-blocker for now due to poor tolerance and if blood pressure is stable will escalate the dose.  He was also started on low-dose ACE inhibitor's and high intensity high-dose atorvastatin.  He will receive aspirin indefinitely and Brilinta for at least 1 year in view of ACS. Patient has astrocytoma and abnormal MRI, we will have to discuss with his oncologist regarding any contraindication for antiplatelet use.  I did not think surgery would be more appropriate in the situation where high-dose heparin had to be utilized and potential for intracranial bleeding.  Also patient had nonsustained VT just prior to angioplasty while we were reviewing the angiogram and hence the lesion was felt to be very unstable when I decided to proceed with angioplasty.  ECHOCARDIOGRAM COMPLETE  Result Date: 07/15/2019    ECHOCARDIOGRAM REPORT   Patient Name:   IZAIAS KRUPKA [facility] Date of Exam: 07/15/2019 Medical Rec #:  [id number]          Height:       71.0 in Accession #:    [id number]         Weight:       172.0 lb Date of Birth:  [birth year]          BSA:          1.977 m Patient Age:    89 years           BP:           112/71 mmHg Patient Gender: M                  HR:           92 bpm. Exam  Location:  Inpatient Procedure: 2D Echo Indications:     R07.9* Chest pain, unspecified; I25.700 Atherosclerosis of                  coronary artery bypass graft(s), unspecified, with unstable                  angina pectoris  History:         Patient has no prior history of Echocardiogram examinations.  Sonographer:     Roseanna Rainbow RDCS Referring Phys:  [place] Diagnosing Phys: Adrian Prows MD IMPRESSIONS  1. Left ventricular ejection fraction, by estimation, is 30 to 35%. The left ventricle has moderate to severely decreased function. The left ventricle demonstrates regional wall motion abnormalities (see scoring diagram/findings for description). There is mild left ventricular hypertrophy. Left ventricular diastolic parameters are consistent with Grade I diastolic dysfunction (impaired relaxation). Entire mid to distal anterior and antero septum and inferoseptum and apex is akinetic. No mural thrombus.  2. Right ventricular systolic function is normal. The right ventricular size is normal. There  is normal pulmonary artery systolic pressure.  3. The mitral valve is normal in structure. Trivial mitral valve regurgitation.  4. The aortic valve is tricuspid. Aortic valve regurgitation is not visualized. Mild aortic valve sclerosis is present, with no evidence of aortic valve stenosis.  5. The inferior vena cava is normal in size with greater than 50% respiratory variability, suggesting right atrial pressure of 3 mmHg. FINDINGS  Left Ventricle: Left ventricular ejection fraction, by estimation, is 30 to 35%. The left ventricle has moderate to severely decreased function. The left ventricle demonstrates regional wall motion abnormalities. The left ventricular internal cavity size was normal in size. There is mild left ventricular hypertrophy. Left ventricular diastolic parameters are consistent with Grade I diastolic dysfunction (impaired relaxation). Normal left ventricular filling pressure.  LV Wall Scoring: The  mid and distal anterior wall, apical lateral segment, mid anterolateral segment, mid inferoseptal segment, apical septal segment, and apex are akinetic. Entire mid to distal anterior and antero septum and inferoseptum and apex is akinetic. No mural thrombus. Right Ventricle: The right ventricular size is normal. No increase in right ventricular wall thickness. Right ventricular systolic function is normal. There is normal pulmonary artery systolic pressure. The tricuspid regurgitant velocity is 2.46 m/s, and  with an assumed right atrial pressure of 3 mmHg, the estimated right ventricular systolic pressure is 29.0 mmHg. Left Atrium: Left atrial size was normal in size. Right Atrium: Right atrial size was normal in size. Pericardium: There is no evidence of pericardial effusion. Mitral Valve: The mitral valve is normal in structure. Trivial mitral valve regurgitation. Tricuspid Valve: The tricuspid valve is normal in structure. Tricuspid valve regurgitation is mild. Aortic Valve: The aortic valve is tricuspid. Aortic valve regurgitation is not visualized. Mild aortic valve sclerosis is present, with no evidence of aortic valve stenosis. Pulmonic Valve: The pulmonic valve was grossly normal. Pulmonic valve regurgitation is trivial. Aorta: The aortic root is normal in size and structure. Venous: The inferior vena cava is normal in size with greater than 50% respiratory variability, suggesting right atrial pressure of 3 mmHg. IAS/Shunts: No atrial level shunt detected by color flow Doppler.  LEFT VENTRICLE PLAX 2D LVIDd:         4.80 cm      Diastology LVIDs:         2.90 cm      LV e' medial:   5.77 cm/s LV PW:         1.10 cm      LV E/e' medial: 9.4 LV IVS:        1.30 cm LVOT diam:     1.70 cm LV SV:         34 LV SV Index:   17 LVOT Area:     2.27 cm  LV Volumes (MOD) LV vol d, MOD A2C: 114.0 ml LV vol d, MOD A4C: 129.0 ml LV vol s, MOD A2C: 71.2 ml LV vol s, MOD A4C: 74.5 ml LV SV MOD A2C:     42.8 ml LV SV MOD  A4C:     129.0 ml LV SV MOD BP:      49.3 ml RIGHT VENTRICLE            IVC RV S prime:     6.85 cm/s  IVC diam: 1.40 cm LEFT ATRIUM             Index       RIGHT ATRIUM  Index LA diam:        1.90 cm 0.96 cm/m  RA Area:     8.08 cm LA Vol (A2C):   35.2 ml 17.80 ml/m RA Volume:   16.10 ml 8.14 ml/m LA Vol (A4C):   16.7 ml 8.45 ml/m LA Biplane Vol: 26.2 ml 13.25 ml/m  AORTIC VALVE             PULMONIC VALVE LVOT Vmax:   94.60 cm/s  PV Vmax:       2.46 m/s LVOT Vmean:  68.800 cm/s PV Peak grad:  24.2 mmHg LVOT VTI:    0.151 m  AORTA Ao Root diam: 3.80 cm MITRAL VALVE               TRICUSPID VALVE MV Area (PHT): 5.38 cm    TR Peak grad:   24.2 mmHg MV Decel Time: 141 msec    TR Vmax:        246.00 cm/s MV E velocity: 54.00 cm/s MV A velocity: 69.80 cm/s  SHUNTS MV E/A ratio:  0.77        Systemic VTI:  0.15 m                            Systemic Diam: 1.70 cm Adrian Prows MD Electronically signed by Adrian Prows MD Signature Date/Time: 07/15/2019/1:58:53 PM    Final      Impression/Plan: 1. 54 y.o. gentlelman with history of WHO grade II astrocytoma. The patient continues to be stable clinically but his short interval brain MRI performed on 07/13/19 confirmed the 2 focal areas of enhancement in the anterior right frontal lobe noted on his previous scan from 05/18/19 have both increased in size in the interim. This is concerning for progression of disease. His imaging was reviewed at out recent multidisciplinary brain tumor boards and the consensus recommendation is for a follow-up visit with Dr. Mickeal Skinner for discussion regarding further evaluation and management.  He is comfortable with and in agreement with the stated plan and will be scheduled for follow up with Dr. Mickeal Skinner in the near future.   2. Chronic headaches and skull pain. The patient continues to be managed well with Advil and Percocet prn, using these sparingly, only for severe pain. He understands that his prescription will only continue to be  filled every 45 days.   Given current concerns for patient exposure during the COVID-19 pandemic, this encounter was conducted via telephone. The patient was notified in advance has given verbal consent for this type of encounter. The time spent during this encounter was 25 minutes with 50% of that time spent in counseling and coordination of his care. The attendants for this meeting include  Lucienne Sawyers PA-C and patient, Ronav Furney. During the encounter, Crystal Ellwood PA-C,was located at [facility] Radiation Oncology Department.  Patient, Alyn Jurney was located at home.   Nicholos Johns, MMS, PA-C [place] at [place]: [phone]  Fax: [phone]

## 2019-07-18 NOTE — Telephone Encounter (Signed)
Pt insurance is active and benefits verified through Medicare a/b Co-pay 0, DED $203/$203 met, out of pocket 0/0 met, co-insurance 20%. no pre-authorization required. Passport, 07/18/2019@4 :00pm, REF# [id number]  Will contact patient to see if he is interested in the Cardiac Rehab Program. If interested, patient will need to complete follow up appt. Once completed, patient will be contacted for scheduling upon review by the RN Navigator.

## 2019-07-18 NOTE — Telephone Encounter (Signed)
Called to remind patient of telephone encounter today grand daughter will assist with follow up appointment. Patient had a MI on Saturday, has two stents placed and discharged home with grand daughter.

## 2019-07-20 ENCOUNTER — Ambulatory Visit: Payer: Medicare Other | Admitting: Internal Medicine

## 2019-07-20 NOTE — Telephone Encounter (Signed)
FYI: Patient has been discharged and knows to be here for his appt. on Monday  I tried to call for [facility] notes, but we got disconnected.

## 2019-07-21 NOTE — Telephone Encounter (Signed)
Okay 

## 2019-07-23 ENCOUNTER — Ambulatory Visit: Payer: Medicare Other | Admitting: Internal Medicine

## 2019-07-23 ENCOUNTER — Encounter: Payer: Self-pay | Admitting: Cardiology

## 2019-07-23 ENCOUNTER — Inpatient Hospital Stay: Payer: Medicare Other | Admitting: Internal Medicine

## 2019-07-23 ENCOUNTER — Ambulatory Visit: Payer: Medicare Other | Admitting: Cardiology

## 2019-07-23 ENCOUNTER — Ambulatory Visit: Payer: Medicare Other

## 2019-07-23 ENCOUNTER — Other Ambulatory Visit: Payer: Self-pay

## 2019-07-23 VITALS — BP 98/67 | HR 90 | Temp 97.8°F | Ht 71.0 in | Wt 164.0 lb

## 2019-07-23 DIAGNOSIS — Z95820 Peripheral vascular angioplasty status with implants and grafts: Secondary | ICD-10-CM | POA: Diagnosis not present

## 2019-07-23 DIAGNOSIS — E1159 Type 2 diabetes mellitus with other circulatory complications: Secondary | ICD-10-CM

## 2019-07-23 DIAGNOSIS — C711 Malignant neoplasm of frontal lobe: Secondary | ICD-10-CM

## 2019-07-23 DIAGNOSIS — I472 Ventricular tachycardia: Secondary | ICD-10-CM

## 2019-07-23 DIAGNOSIS — Z720 Tobacco use: Secondary | ICD-10-CM | POA: Diagnosis not present

## 2019-07-23 DIAGNOSIS — Z09 Encounter for follow-up examination after completed treatment for conditions other than malignant neoplasm: Secondary | ICD-10-CM

## 2019-07-23 DIAGNOSIS — I4729 Other ventricular tachycardia: Secondary | ICD-10-CM

## 2019-07-23 DIAGNOSIS — E782 Mixed hyperlipidemia: Secondary | ICD-10-CM

## 2019-07-23 DIAGNOSIS — Z7689 Persons encountering health services in other specified circumstances: Secondary | ICD-10-CM

## 2019-07-23 DIAGNOSIS — I214 Non-ST elevation (NSTEMI) myocardial infarction: Secondary | ICD-10-CM

## 2019-07-23 DIAGNOSIS — I2510 Atherosclerotic heart disease of native coronary artery without angina pectoris: Secondary | ICD-10-CM | POA: Diagnosis not present

## 2019-07-23 DIAGNOSIS — Z794 Long term (current) use of insulin: Secondary | ICD-10-CM | POA: Diagnosis not present

## 2019-07-23 NOTE — Progress Notes (Signed)
Chief Complaint  Patient presents with  . Hospitalization Follow-up  . Coronary Artery Disease  . Hyperlipidemia    REQUESTING PHYSICIAN:  Velna Ochs, MD [address] [place],  [place] [id number]  HPI  Jonathan Barker is a 54 y.o. male who presents to the office with a chief complaint of "hospital follow-up, since heart disease, recent stent." Patient's past medical history and cardiac risk factors include: Recent non-STEMI, established coronary artery disease with PCI, hypertension, hyperlipidemia, insulin-dependent diabetes mellitus type 2, nonsustained ventricular tachycardia, active tobacco use, history of astrocytoma.  Admitted to the hospital when he presented to the emergency room with 7 out of 10 intensity chest pain that started at 10 AM this morning on 07/14/2019. In the emergency room initially had mild ST elevation in anterior leads, was started on IV heparin and aspirin was given and although EKG did not meet the criteria for STEMI, in view of ongoing chest pain and markedly elevated serum troponin, STEMI was activated.   During his left heart catheterization patient underwent successful PTCA and stenting.  Please refer to the left heart catheterization report for additional details.  Since hospitalization he has not required any nitroglycerin tablets.  He denies any chest pain at rest or with effort line activities.  He has not started cardiac rehab yet either.  Patient's daughter states that he was instructed to start cardiac rehab approximately a month after his discharge.  During hospitalization patient had episodes of nonsustained ventricular tachycardia for which he was asymptomatic.  He was started on beta-blocker therapy and is doing well.  Today's vital signs noted his blood pressure is less than 100 mmHg and heart rate is better controlled.  He is asymptomatic in regards to no lightheadedness, dizziness, near syncope or syncopal events.  Since hospitalization  patient has decreased his tobacco use significantly.  He is only now smoking half a pack per day and has intentions of quitting as well.  During last hospitalization he was also noted to have poorly controlled diabetes mellitus type 2.  He is currently on oral medications and insulin therapy.  His sugars are better controlled according to the patient's daughter.  And they have a follow-up appointment with his PCP later this week.  Currently patient denies chest pain, shortness of breath at rest or effort related symptoms, lightheadedness, dizziness, palpitations, orthopnea, paroxysmal nocturnal dyspnea, lower extremity swelling, near syncope, syncopal events, hematochezia, hemoptysis, hematemesis, melanotic stools, no symptoms of amaurosis fugax, motor or sensory symptoms or dysphasia in the last 6 months.   History of established coronary artery disease with prior PCI, recent non-STEMI, ischemic cardiomyopathy. Denies prior history of congestive heart failure, deep venous thrombosis, pulmonary embolism, stroke, transient ischemic attack.  ALLERGIES: No Known Allergies   MEDICATION LIST PRIOR TO VISIT: Current Outpatient Medications on File Prior to Visit  Medication Sig Dispense Refill  . aspirin 81 MG chewable tablet Chew 1 tablet (81 mg total) by mouth daily. 90 tablet 3  . atorvastatin (LIPITOR) 80 MG tablet Take 1 tablet (80 mg total) by mouth daily at 6 PM. 90 tablet 3  . blood glucose meter kit and supplies KIT Dispense based on patient and insurance preference. Use up to four times daily as directed. (FOR ICD-9 250.00, 250.01). 1 each 0  . clopidogrel (PLAVIX) 75 MG tablet Take 1 tablet (75 mg total) by mouth daily. 90 tablet 3  . Empagliflozin-metFORMIN HCl 12.08-998 MG TABS Take 1 tablet by mouth 2 (two) times daily. 180 tablet 1  .  gabapentin (NEURONTIN) 300 MG capsule Take 1 capsule (300 mg total) by mouth at bedtime. (Patient taking differently: Take 300 mg by mouth 2 (two) times  daily. ) 30 capsule 11  . Insulin Glargine (BASAGLAR KWIKPEN) 100 UNIT/ML Inject 0.1 mLs (10 Units total) into the skin daily. 15 mL 0  . Insulin Pen Needle (PEN NEEDLES) 32G X 4 MM MISC 1 pen by Does not apply route as directed. 100 each 11  . lisinopril (ZESTRIL) 5 MG tablet Take 1 tablet (5 mg total) by mouth daily. 90 tablet 3  . magnesium oxide (MAG-OX) 400 (241.3 Mg) MG tablet Take 0.5 tablets (200 mg total) by mouth 2 (two) times daily. 90 tablet 1  . metoprolol tartrate (LOPRESSOR) 50 MG tablet Take 1 tablet twice daily- Hold if systolic BP (top blood pressure number) less than 100 mmHg or heart rate less than 60 bpm (pulse). 90 tablet 3  . nitroGLYCERIN (NITROSTAT) 0.4 MG SL tablet Place 1 tablet (0.4 mg total) under the tongue every 5 (five) minutes x 3 doses as needed for chest pain. 90 tablet 3  . [START ON 07/24/2019] oxyCODONE-acetaminophen (PERCOCET/ROXICET) 5-325 MG tablet TAKE 1 TABLET BY MOUTH AT BEDTIME. MAY REPEAT 1 ADDITIONAL TABLET AS NEEDED 90 tablet 0  . phenytoin (DILANTIN) 100 MG ER capsule Take 3 capsules every morning 270 capsule 3   No current facility-administered medications on file prior to visit.    PAST MEDICAL HISTORY: Past Medical History:  Diagnosis Date  . Astrocytoma of frontal lobe ([place]) 05/31/2013  . Coronary artery disease   . DM (diabetes mellitus) ([place]) 08/09/2011   A1c at diagnosis = 12.4  . Family history of breast cancer   . GERD (gastroesophageal reflux disease) 08/09/2011  . H/O heart artery stent 07/14/2018  . Hyperlipidemia   . NSVT (nonsustained ventricular tachycardia) ([place])   . Seizure ([place]) 05/19/2013  . Shortness of breath     PAST SURGICAL HISTORY: Past Surgical History:  Procedure Laterality Date  . APPENDECTOMY    . BRAIN BIOPSY N/A 05/24/2013   Procedure: Craniotomy for open biopsy;  Surgeon: Winfield Cunas, MD;  Location: [facility];  Service: Neurosurgery;  Laterality: N/A;  Craniotomy for open biopsy  . CARDIAC  CATHETERIZATION    . CORONARY ANGIOPLASTY WITH STENT PLACEMENT    . CORONARY/GRAFT ACUTE MI REVASCULARIZATION N/A 07/15/2019   Procedure: Coronary/Graft Acute MI Revascularization;  Surgeon: Adrian Prows, MD;  Location: [facility];  Service: Cardiovascular;  Laterality: N/A;  . LEFT HEART CATH AND CORONARY ANGIOGRAPHY N/A 07/15/2019   Procedure: LEFT HEART CATH AND CORONARY ANGIOGRAPHY;  Surgeon: Adrian Prows, MD;  Location: [facility];  Service: Cardiovascular;  Laterality: N/A;    FAMILY HISTORY: The patient family history includes Bone cancer in his mother; Breast cancer in his mother; Diabetes in his brother, father, and paternal uncle; Heart disease in his father.   SOCIAL HISTORY:  The patient  reports that he has been smoking cigarettes. He has a 33.00 pack-year smoking history. He has never used smokeless tobacco. He reports previous alcohol use. He reports current drug use. Drug: Marijuana.  14 ORGAN REVIEW OF SYSTEMS: CONSTITUTIONAL: No fever or significant weight loss EYES: No recent significant visual change EARS, NOSE, MOUTH, THROAT: No recent significant change in hearing CARDIOVASCULAR: See discussion in subjective/HPI RESPIRATORY: See discussion in subjective/HPI GASTROINTESTINAL: No recent complaints of abdominal pain GENITOURINARY: No recent significant change in genitourinary status MUSCULOSKELETAL: No recent significant change in musculoskeletal status  INTEGUMENTARY: No recent rash NEUROLOGIC: No recent significant change in motor function PSYCHIATRIC: No recent significant change in mood ENDOCRINOLOGIC: No recent significant change in endocrine status HEMATOLOGIC/LYMPHATIC: No recent significant unexpected bruising ALLERGIC/IMMUNOLOGIC: No recent unexplained allergic reaction  PHYSICAL EXAM: Vitals with BMI 07/23/2019 07/23/2019 07/16/2019  Height - 5' 11"  -  Weight - 164 lbs -  BMI - 90.93 -  Systolic 98 93 -  Diastolic 67 57 -  Pulse 90 88 117     Constitutional: He isoriented to person, place, and time. He appearswell-developedand well-nourished.No distress.  HENT:  Head:Normocephalicand atraumatic.  Eyes: Right eye exhibitsno discharge. Left eye exhibitsno discharge.No scleral icterus.  Neck:No JVDpresent. No tracheal deviationpresent.  Cardiovascular:Normal rateand regular rhythm. Exam revealsno friction rub. No murmurheard. Pulmonary/Chest:Breath sounds normal. Norespiratory distress. He hasno wheezes. He hasno rales.  Abdominal:Soft.Bowel sounds are normal. He exhibitsno distension. There isno abdominal tenderness. There isno reboundand no guarding.  Musculoskeletal:  General: No edema.Normal range of motion.  Cervical back: Normal range of motionand neck supple.  Neurological: He is alertand oriented to person, place, and time. Nocranial nerve deficit.  Skin: Skin isdry.No rashnoted. He is not diaphoretic. Noerythema.  Psychiatric: He has anormal mood and affect. Hisbehavior is normal.Judgmentand thought contentnormal.   Cardiac Studies: EKG  07/14/2019: Normal sinus rhythm, no evidence of ischemia. Compared to prior EKG done a few minutes ago, minimal ST elevation in V1 through V3 which does not meet STEMI criteria appears to have improved. 07/23/2019: Sinus  Rhythm ventricular rate of 86 bpm, right axis deviation, old anterior infarct (age undetermined), T wave inversions in the lateral leads V4-V6, lead I and aVL suggestive of anterolateral ischemia.  Compared to prior EKG dated 07/15/2019 patient continues to be in normal sinus rhythm with T wave inversions in leads V4-V6 are new.  Echocardiogram: LVEF 30-35%, moderate to severely decreased function, with regional wall motion abnormalities, grade 1 diastolic impairment, no mural thrombus, no pulmonary hypertension.  Left Heart Catheterization 07/15/19: LV: Mid to distal anterior, anterolateral and apical akinesis, EF  35 to at most 40%. Hand contrast injection, not well visualized. Normal LVEDP. RCA: Dominant, mild diffuse disease. Left main: Mildly calcified. LAD: Diffusely diseased proximal segment, mid segment had a 90% stenosis. Mid to distal segment has minimal disease and apical LAD had 99% stenosis. Successful PTCA and stenting of the proximal/ostial and mid LAD with implantation of a long 3.0 x 38 mm resolute Onyx DES postdilated with 3.5 x 12 mm noncompliant sapphire balloon. 90% stenosis reduced to 0% with TIMI-3 to TIMI-3 flow. Apical LAD stenosis left alone. Ramus intermediate: Large vessel, ostium has ulcerated appearing 99% stenosis. Upon angioplasty realized it was also calcified and probably chronic. Successful PTCA and stenting with 2 overlapping 2.5 x 15 and distally 2.5 x 13 mm resolute Onyx DES. 9 9% stenosis reduced to 0% with TIMI-3 to TIMI-3 flow. Circumflex: Moderate to large vessel, proximal to mid segment is diffusely diseased 30%. Large OM1 with proximal long 60% to at most 70% stenosis.  LABORATORY DATA: CBC Latest Ref Rng & Units 07/16/2019 07/15/2019 07/14/2019  WBC 4.0 - 10.5 K/uL 21.8(H) 20.2(H) 17.0(H)  Hemoglobin 13.0 - 17.0 g/dL 14.5 17.1(H) 18.1(H)  Hematocrit 39.0 - 52.0 % 42.4 49.7 54.0(H)  Platelets 150 - 400 K/uL 299 330 345    CMP Latest Ref Rng & Units 07/16/2019 07/15/2019 07/14/2019  Glucose 70 - 99 mg/dL 156(H) 184(H) 179(H)  BUN 6 - 20 mg/dL 36(H) 10 11  Creatinine 0.61 - 1.24 mg/dL  0.67 0.75 0.77  Sodium 135 - 145 mmol/L 141 139 140  Potassium 3.5 - 5.1 mmol/L 3.8 3.7 3.8  Chloride 98 - 111 mmol/L 112(H) 103 104  CO2 22 - 32 mmol/L 18(L) 24 23  Calcium 8.9 - 10.3 mg/dL 9.1 9.1 9.5  Total Protein 6.4 - 8.3 g/dL - - -  Total Bilirubin 0.20 - 1.20 mg/dL - - -  Alkaline Phos 40 - 150 U/L - - -  AST 5 - 34 U/L - - -  ALT 0 - 55 U/L - - -    Lipid Panel     Component Value Date/Time   CHOL 244 (H) 07/14/2019 2336   TRIG 126 07/14/2019 2336   HDL 43  07/14/2019 2336   CHOLHDL 5.7 07/14/2019 2336   VLDL 25 07/14/2019 2336   LDLCALC 176 (H) 07/14/2019 2336    Lab Results  Component Value Date   HGBA1C 10.6 (H) 07/14/2019   HGBA1C 10.7 (A) 03/27/2018   HGBA1C 9.4 06/27/2017   No components found for: NTPROBNP Lab Results  Component Value Date   TSH 1.073 07/18/2011    FINAL MEDICATION LIST END OF ENCOUNTER: No orders of the defined types were placed in this encounter.   There are no discontinued medications.   Current Outpatient Medications:  .  aspirin 81 MG chewable tablet, Chew 1 tablet (81 mg total) by mouth daily., Disp: 90 tablet, Rfl: 3 .  atorvastatin (LIPITOR) 80 MG tablet, Take 1 tablet (80 mg total) by mouth daily at 6 PM., Disp: 90 tablet, Rfl: 3 .  blood glucose meter kit and supplies KIT, Dispense based on patient and insurance preference. Use up to four times daily as directed. (FOR ICD-9 250.00, 250.01)., Disp: 1 each, Rfl: 0 .  clopidogrel (PLAVIX) 75 MG tablet, Take 1 tablet (75 mg total) by mouth daily., Disp: 90 tablet, Rfl: 3 .  Empagliflozin-metFORMIN HCl 12.08-998 MG TABS, Take 1 tablet by mouth 2 (two) times daily., Disp: 180 tablet, Rfl: 1 .  gabapentin (NEURONTIN) 300 MG capsule, Take 1 capsule (300 mg total) by mouth at bedtime. (Patient taking differently: Take 300 mg by mouth 2 (two) times daily. ), Disp: 30 capsule, Rfl: 11 .  Insulin Glargine (BASAGLAR KWIKPEN) 100 UNIT/ML, Inject 0.1 mLs (10 Units total) into the skin daily., Disp: 15 mL, Rfl: 0 .  Insulin Pen Needle (PEN NEEDLES) 32G X 4 MM MISC, 1 pen by Does not apply route as directed., Disp: 100 each, Rfl: 11 .  lisinopril (ZESTRIL) 5 MG tablet, Take 1 tablet (5 mg total) by mouth daily., Disp: 90 tablet, Rfl: 3 .  magnesium oxide (MAG-OX) 400 (241.3 Mg) MG tablet, Take 0.5 tablets (200 mg total) by mouth 2 (two) times daily., Disp: 90 tablet, Rfl: 1 .  metoprolol tartrate (LOPRESSOR) 50 MG tablet, Take 1 tablet twice daily- Hold if systolic BP  (top blood pressure number) less than 100 mmHg or heart rate less than 60 bpm (pulse)., Disp: 90 tablet, Rfl: 3 .  nitroGLYCERIN (NITROSTAT) 0.4 MG SL tablet, Place 1 tablet (0.4 mg total) under the tongue every 5 (five) minutes x 3 doses as needed for chest pain., Disp: 90 tablet, Rfl: 3 .  [START ON 07/24/2019] oxyCODONE-acetaminophen (PERCOCET/ROXICET) 5-325 MG tablet, TAKE 1 TABLET BY MOUTH AT BEDTIME. MAY REPEAT 1 ADDITIONAL TABLET AS NEEDED, Disp: 90 tablet, Rfl: 0 .  phenytoin (DILANTIN) 100 MG ER capsule, Take 3 capsules every morning, Disp: 270 capsule, Rfl: 3  IMPRESSION:  ICD-10-CM   1. Encounter for support and coordination of transition of care  Z76.89   2. Hospital discharge follow-up  L54 Basic Metabolic Panel (BMET)    Magnesium  3. Non-ST elevation (NSTEMI) myocardial infarction (HCC)  I21.4 EKG 49-EEFE    Basic Metabolic Panel (BMET)    Magnesium  4. Coronary artery disease involving native coronary artery of native heart without angina pectoris  I25.10   5. S/P angioplasty with stent  Z95.820   6. Mixed hyperlipidemia  E78.2   7. Nonsustained ventricular tachycardia (HCC)  I47.2 HOLTER MONITOR - 48 HOUR  8. Tobacco use  Z72.0   9. Type 2 diabetes mellitus with other circulatory complication, with long-term current use of insulin (HCC)  E11.59    Z79.4   10. Astrocytoma of frontal lobe (HCC)  C71.1      RECOMMENDATIONS: Jonathan Barker is a 54 y.o. male whose past medical history and cardiac risk factors include: History of astrocytoma, non-STEMI, nonsustained ventricular tachycardia, established coronary artery disease a status post angioplasty and stenting, ischemic cardiomyopathy, uncontrolled diabetes mellitus type 2, hyperlipidemia, tobacco use.  Post hospitalization:  Patient is doing well since discharge.  Patient does not have any chest pain at rest or with effort related activities.  Patient has not required sublingual nitroglycerin tablets.  He is  compliant with dual antiplatelet therapy.  He is also compliant with his discharge medications with the help of his daughter who is also present at today's office visit.  Patient is encouraged to start cardiac rehab based on availability.  Established coronary artery disease status post stenting:  Continue dual antiplatelet therapy.  EKG performed in the office and noted above.  T wave inversions in the lateral leads are new compared to prior EKG.  However, patient remains asymptomatic and with recommend medical therapy for now.  Patient and his daughter are informed of the EKG findings and to seek medical attention if he develops any symptoms in the interim.  Continue with guideline directed medical therapy.  Currently not uptitrating medical therapy as his systolic blood pressures are less than 100 mmHg but remains asymptomatic.  Ischemic cardiomyopathy:   Management as discussed above.  Ejection Fraction noted on last 2D Echo  Recommend daily weight check, strict I/O's  Fluid restriction to <2L per day, Na restriction < 2g per day  Will follow up EF after optimizing and maintaining GDMT for 3 months for evaluation of ICD implantation for primary prevention of SCD  Nonsustained ventricular tachycardia:  Patient had episodes of nonsustained ventricular tachycardia in the hospital these were asymptomatic.  Recommend a 48-hour Holter to reevaluate underlying arrhythmic burden.  Continue current medical therapy.  Active tobacco use:  Patient continues to smoke but has decreased since last office visit.  He is now smoking 0.5 packs/day and he has intentions of quitting at the next office visit.  Insulin-dependent diabetes mellitus type 2:  Patient was noted to have poorly controlled diabetes mellitus type 2.  He was started on insulin prior to discharge.  Currently on Metformin/SGLT 2 inhibitors.  Patient has appointment with PCP later this week.   Mixed  hyperlipidemia:  Currently on high intensity statin therapy after his non-STEMI.  Recommend repeating a lipid profile the next office visit to reassess therapy.  Orders Placed This Encounter  Procedures  . Basic Metabolic Panel (BMET)  . Magnesium  . HOLTER MONITOR - 48 HOUR  . EKG 12-Lead   --Continue cardiac medications as reconciled in final medication list. --  Return in about 4 weeks (around 08/20/2019) for Discussion of test results and ICMP. Or sooner if needed. --Continue follow-up with your primary care physician regarding the management of your other chronic comorbid conditions.  Patient's questions and concerns were addressed to his satisfaction. He voices understanding of the instructions provided during this encounter.   This note was created using a voice recognition software as a result there may be grammatical errors inadvertently enclosed that do not reflect the nature of this encounter. Every attempt is made to correct such errors.  Rex Kras, DO, [facility]. [place] Office: [phone]

## 2019-07-24 ENCOUNTER — Ambulatory Visit: Payer: Medicare Other | Admitting: Internal Medicine

## 2019-07-24 DIAGNOSIS — Z09 Encounter for follow-up examination after completed treatment for conditions other than malignant neoplasm: Secondary | ICD-10-CM | POA: Diagnosis not present

## 2019-07-24 DIAGNOSIS — I214 Non-ST elevation (NSTEMI) myocardial infarction: Secondary | ICD-10-CM | POA: Diagnosis not present

## 2019-07-24 NOTE — Telephone Encounter (Signed)
Called pt to see if he wanted to schedule for cardiac rehab pt stated that he couldn't really afford to have another bill advised pt that he would have a 20% co-insurance for medicare and I asked pt was medicare the only insurance he had pt stated that he thought he might have had medicaid, I checked if pt had medicaid and he didn't, advised pt of that and told him about the 20% co-insurance and pt stated that he will think about it some more and call me back. Placed pt ppw in the ready to schedule green folder.

## 2019-07-25 ENCOUNTER — Ambulatory Visit (INDEPENDENT_AMBULATORY_CARE_PROVIDER_SITE_OTHER): Payer: Medicare Other | Admitting: Internal Medicine

## 2019-07-25 ENCOUNTER — Ambulatory Visit: Payer: Medicare Other

## 2019-07-25 VITALS — BP 93/51 | HR 100 | Temp 98.1°F | Ht 71.0 in | Wt 165.6 lb

## 2019-07-25 DIAGNOSIS — Z794 Long term (current) use of insulin: Secondary | ICD-10-CM | POA: Diagnosis not present

## 2019-07-25 DIAGNOSIS — I252 Old myocardial infarction: Secondary | ICD-10-CM

## 2019-07-25 DIAGNOSIS — I249 Acute ischemic heart disease, unspecified: Secondary | ICD-10-CM

## 2019-07-25 DIAGNOSIS — E1159 Type 2 diabetes mellitus with other circulatory complications: Secondary | ICD-10-CM | POA: Diagnosis not present

## 2019-07-25 DIAGNOSIS — R000 Tachycardia, unspecified: Secondary | ICD-10-CM | POA: Diagnosis not present

## 2019-07-25 DIAGNOSIS — I2510 Atherosclerotic heart disease of native coronary artery without angina pectoris: Secondary | ICD-10-CM

## 2019-07-25 LAB — BASIC METABOLIC PANEL
BUN/Creatinine Ratio: 17 (ref 9–20)
BUN: 12 mg/dL (ref 6–24)
CO2: 22 mmol/L (ref 20–29)
Calcium: 9.4 mg/dL (ref 8.7–10.2)
Chloride: 100 mmol/L (ref 96–106)
Creatinine, Ser: 0.71 mg/dL — ABNORMAL LOW (ref 0.76–1.27)
GFR calc Af Amer: 125 mL/min/{1.73_m2} (ref >59)
GFR calc non Af Amer: 108 mL/min/{1.73_m2} (ref >59)
Glucose: 189 mg/dL — ABNORMAL HIGH (ref 65–99)
Potassium: 4.4 mmol/L (ref 3.5–5.2)
Sodium: 138 mmol/L (ref 134–144)

## 2019-07-25 LAB — MAGNESIUM: Magnesium: 2.1 mg/dL (ref 1.6–2.3)

## 2019-07-25 MED ORDER — BASAGLAR KWIKPEN 100 UNIT/ML ~~LOC~~ SOPN: 12.0000 [IU] | PEN_INJECTOR | Freq: Every day | SUBCUTANEOUS | 0 refills | Status: DC

## 2019-07-25 MED ORDER — BLOOD PRESSURE CUFF MISC: 1.0000 | Freq: Every day | 0 refills | Status: AC | PRN

## 2019-07-25 NOTE — Assessment & Plan Note (Signed)
Recent A1c checked during admission for NSTEMI (now s/p stent x2). A1c remained elevated at that time at 10.6. He states he had been taking his combination pill twice a day everyday. He was started on Basaglar during recent admission (10U daily) and reports his home CBGs have ranged 150-250. Will continue combination pill and increase Insulin to 12U daily. His daughter his helping him check his sugars and with his other care. They are instructed to reduce dose in he has symptoms of low blood sugar or readings <80. - Empagliflozin-Metformin 12.5-1000mg  BID - Basaglar 12U Daily - Follow up in 3 months

## 2019-07-25 NOTE — Assessment & Plan Note (Signed)
Admitted to the hospital with 7/10 chest pain and elevated toponin on 07/14/2019. Underwent successful PTCA and stenting. He is following with [facility]. He is currently taking Lisinopril, Metoprolol, Atorvastatin, ASA, and Plavix. He has been noted to have soft BP's.  He is accompanied by his daughter who is requesting a home BP cuff as they were instructed to hold his metoprolol for Systolic BP [id number]. We are out of our supply of BP cuffs but a prescription was provided. They are advised to reduce his Lisinopril dose to 2.5mg  Daily if unable to obtain a BP cuff to know when to skip metoprolol dose. - Follow up with Cardiology - Lisinopril 5,g Daily (2.5mg  if unable to get BP cuff) - Metoprolol 50mg  Daily (Hold for SBP <100 or HR <60) - Atorvastatin 80mg  Daily - ASA 81mg  Daily - Plavix 75mg  Daily

## 2019-07-25 NOTE — Patient Instructions (Addendum)
Thank you for allowing [place] to care for you  For diabetes - Continue pill twice a day - Increase insulin to 12U daily, Can decrease if blood sugar <80 is seen or symptoms  For blood pressure - Continue medication for heart disease - Blood pressure cuff provided  Reduce dose of Lisinopril to 2.5mg  (1/2 tablet if unable to get blood pressure cuff)  Follow up in about 3 months

## 2019-07-25 NOTE — Assessment & Plan Note (Addendum)
Admitted to the hospital with 7/10 chest pain and elevated toponin on 07/14/2019. Underwent successful PTCA and stenting. He is following with [facility]. He is currently taking Lisinopril, Metoprolol, Atorvastatin, ASA, and Plavix. He has been noted to have soft BP's.  He is accompanied by his daughter who is requesting a home BP cuff as they were instructed to hold his metoprolol for Systolic BP [id number]. We are out of our supply of BP cuffs but a prescription was provided. They are advised to reduce his Lisinopril dose to 2.5mg  Daily if unable to obtain a BP cuff to know when to skip metoprolol dose. - Follow up with Cardiology - Lisinopril 5,g Daily (2.5mg  if unable to get BP cuff) - Metoprolol 50mg  Daily (Hold for SBP <100 or HR <60) - Atorvastatin 80mg  Daily - ASA 81mg  Daily - Plavix 75mg  Daily

## 2019-07-25 NOTE — Progress Notes (Signed)
   CC: CAD, Diabetes  HPI:  Jonathan Barker is a 54 y.o. M with PMHx listed below presenting for CAD, Diabetes. Please see the A&P for the status of the patient's chronic medical problems.  Past Medical History:  Diagnosis Date  . Astrocytoma of frontal lobe ([place]) 05/31/2013  . Coronary artery disease   . DM (diabetes mellitus) ([place]) 08/09/2011   A1c at diagnosis = 12.4  . Family history of breast cancer   . GERD (gastroesophageal reflux disease) 08/09/2011  . H/O heart artery stent 07/14/2018  . Hyperlipidemia   . NSVT (nonsustained ventricular tachycardia) ([place])   . Seizure ([place]) 05/19/2013  . Shortness of breath    Review of Systems:  Performed and all others negative.  Physical Exam:  Vitals:   07/25/19 1332  BP: (!) 93/51  Pulse: 100  Temp: 98.1 F (36.7 C)  TempSrc: Oral  SpO2: 100%  Weight: 165 lb 9.6 oz (75.1 kg)  Height: 5\' 11"  (1.803 m)   Physical Exam Constitutional:      General: He is not in acute distress.    Appearance: Normal appearance.  Cardiovascular:     Rate and Rhythm: Normal rate and regular rhythm.     Pulses: Normal pulses.     Heart sounds: Normal heart sounds.  Pulmonary:     Effort: Pulmonary effort is normal. No respiratory distress.     Breath sounds: Normal breath sounds.  Abdominal:     General: Bowel sounds are normal. There is no distension.     Palpations: Abdomen is soft.     Tenderness: There is no abdominal tenderness.  Musculoskeletal:        General: No swelling or deformity.  Skin:    General: Skin is warm and dry.  Neurological:     General: No focal deficit present.     Mental Status: Mental status is at baseline.    Assessment & Plan:   See Encounters Tab for problem based charting.  Patient discussed with Dr. Daryll Barker

## 2019-07-26 ENCOUNTER — Telehealth: Payer: Self-pay

## 2019-07-26 NOTE — Progress Notes (Signed)
Internal Medicine Clinic Attending  Case discussed with Dr. Melvin soon after the resident saw the patient.  We reviewed the resident's history and exam and pertinent patient test results.  I agree with the assessment, diagnosis, and plan of care documented in the resident's note.   

## 2019-07-26 NOTE — Telephone Encounter (Signed)
Spoke with patient's daughter and relayed information in regards to his lab results and management. Patient's daughter also wanted to inform you that Patient's PCP decreased his Lisinopril to 2.5mg  because his blood pressure was bottoming out. Patient is doing well per daughter. Thanks!

## 2019-07-26 NOTE — Telephone Encounter (Signed)
Okay 

## 2019-07-27 MED FILL — OXYCODONE-ACETAMINOPHEN 5-3: 5-325 | 45 days supply | Qty: 90 | Fill #0

## 2019-07-31 ENCOUNTER — Inpatient Hospital Stay: Payer: Medicare Other | Attending: Internal Medicine | Admitting: Internal Medicine

## 2019-07-31 ENCOUNTER — Other Ambulatory Visit: Payer: Self-pay

## 2019-07-31 VITALS — BP 104/70 | HR 115 | Temp 98.7°F | Resp 18 | Ht 71.0 in | Wt 162.2 lb

## 2019-07-31 DIAGNOSIS — I25110 Atherosclerotic heart disease of native coronary artery with unstable angina pectoris: Secondary | ICD-10-CM | POA: Insufficient documentation

## 2019-07-31 DIAGNOSIS — Z923 Personal history of irradiation: Secondary | ICD-10-CM | POA: Diagnosis not present

## 2019-07-31 DIAGNOSIS — I252 Old myocardial infarction: Secondary | ICD-10-CM | POA: Insufficient documentation

## 2019-07-31 DIAGNOSIS — M79673 Pain in unspecified foot: Secondary | ICD-10-CM | POA: Insufficient documentation

## 2019-07-31 DIAGNOSIS — Z803 Family history of malignant neoplasm of breast: Secondary | ICD-10-CM | POA: Insufficient documentation

## 2019-07-31 DIAGNOSIS — R930 Abnormal findings on diagnostic imaging of skull and head, not elsewhere classified: Secondary | ICD-10-CM | POA: Insufficient documentation

## 2019-07-31 DIAGNOSIS — Z794 Long term (current) use of insulin: Secondary | ICD-10-CM | POA: Diagnosis not present

## 2019-07-31 DIAGNOSIS — F1290 Cannabis use, unspecified, uncomplicated: Secondary | ICD-10-CM | POA: Insufficient documentation

## 2019-07-31 DIAGNOSIS — Z79899 Other long term (current) drug therapy: Secondary | ICD-10-CM | POA: Diagnosis not present

## 2019-07-31 DIAGNOSIS — F17210 Nicotine dependence, cigarettes, uncomplicated: Secondary | ICD-10-CM | POA: Insufficient documentation

## 2019-07-31 DIAGNOSIS — E785 Hyperlipidemia, unspecified: Secondary | ICD-10-CM | POA: Diagnosis not present

## 2019-07-31 DIAGNOSIS — Z833 Family history of diabetes mellitus: Secondary | ICD-10-CM | POA: Insufficient documentation

## 2019-07-31 DIAGNOSIS — C711 Malignant neoplasm of frontal lobe: Secondary | ICD-10-CM | POA: Diagnosis not present

## 2019-07-31 DIAGNOSIS — Z808 Family history of malignant neoplasm of other organs or systems: Secondary | ICD-10-CM | POA: Insufficient documentation

## 2019-07-31 DIAGNOSIS — Z8249 Family history of ischemic heart disease and other diseases of the circulatory system: Secondary | ICD-10-CM | POA: Diagnosis not present

## 2019-07-31 DIAGNOSIS — E119 Type 2 diabetes mellitus without complications: Secondary | ICD-10-CM | POA: Diagnosis not present

## 2019-07-31 DIAGNOSIS — R079 Chest pain, unspecified: Secondary | ICD-10-CM | POA: Insufficient documentation

## 2019-07-31 NOTE — Progress Notes (Signed)
[facility] [place], [place] [address] [phone]   Interval Evaluation  Date of Service: 07/31/19 Patient Name: Jonathan Barker Patient MRN: [id number] Patient DOB: [birth year] Provider: Ventura Sellers, MD  Identifying Statement:  Jonathan Barker is a 54 y.o. 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 Christella Noa, pathology demonstrates WHO grade II diffuse astrocytoma   06/18/2013 - 09/03/2013 Radiation Therapy   54 Gy IMRT in 30 fx, concurrent Temodar, with Dr. Tammi Klippel     Biomarkers:  MGMT Unknown.  IDH 1/2 Unknown.  EGFR Unknown  TERT Unknown   Interval History:  Jonathan Barker presents today for follow up after MRI brain.  He describes no new or progressive neurologic deficits.  No seizures or headaches.  Unfortunately was admitted two weeks ago for acute MI and required catheterization and stent placement x3.  He feels somewhat improved although not quite back to baseline energy level.  H+P (03/31/17) Patient presented initially in early 2015 with a focal seizure, described as "left side clenching up", "change in awareness without loss in memory".  This event led to an MRI brain which demonstrated a likely mass centered in the right frontal lobe.  This underwent biopsy on 05/24/13, pathology revealed grade II glioma.  No genetic markers were sent at that time.  Following surgery he underwent radiation therapy as above, with concurrent Temodar.  Although he did consult with a neuro-oncologist in [place], no further therapy was administered, and he has been on observation for the past 3 years.  He return today with an MRI for  evaluation.  He denies any recent seizures, does complain of 3-4 headaches per week as prior.    Medications: Current Outpatient Medications on File Prior to Visit  Medication Sig Dispense Refill  . aspirin 81 MG chewable tablet Chew 1 tablet (81 mg total) by mouth daily. 90 tablet 3  . atorvastatin (LIPITOR) 80 MG tablet Take 1 tablet (80 mg total) by mouth daily at 6 PM. 90 tablet 3  . blood glucose meter kit and supplies KIT Dispense based on patient and insurance preference. Use up to four times daily as directed. (FOR ICD-9 250.00, 250.01). 1 each 0  . Blood Pressure Monitoring (BLOOD PRESSURE CUFF) MISC 1 each by Does not apply route daily as needed. 1 each 0  . clopidogrel (PLAVIX) 75 MG tablet Take 1 tablet (75 mg total) by mouth daily. 90 tablet 3  . Empagliflozin-metFORMIN HCl 12.08-998 MG TABS Take 1 tablet by mouth 2 (two) times daily. 180 tablet 1  . gabapentin (NEURONTIN) 300 MG capsule Take 1 capsule (300 mg total) by mouth at bedtime. (Patient taking differently: Take 300 mg by mouth 2 (two) times daily. ) 30 capsule 11  . Insulin Glargine (BASAGLAR KWIKPEN) 100 UNIT/ML Inject 0.12 mLs (12 Units total) into the skin daily. 15 mL 0  . Insulin Pen Needle (PEN NEEDLES) 32G X 4 MM MISC 1 pen by Does not apply route as directed. 100 each 11  . lisinopril (ZESTRIL) 5 MG tablet Take 1 tablet (5 mg total) by mouth daily. 90 tablet 3  . magnesium oxide (MAG-OX) 400 (241.3 Mg) MG tablet Take  0.5 tablets (200 mg total) by mouth 2 (two) times daily. 90 tablet 1  . metoprolol tartrate (LOPRESSOR) 50 MG tablet Take 1 tablet twice daily- Hold if systolic BP (top blood pressure number) less than 100 mmHg or heart rate less than 60 bpm (pulse). 90 tablet 3  . nitroGLYCERIN (NITROSTAT) 0.4 MG SL tablet Place 1 tablet (0.4 mg total) under the tongue every 5 (five) minutes x 3 doses as needed for chest pain. 90 tablet 3  . oxyCODONE-acetaminophen (PERCOCET/ROXICET) 5-325 MG tablet TAKE 1 TABLET BY  MOUTH AT BEDTIME. MAY REPEAT 1 ADDITIONAL TABLET AS NEEDED 90 tablet 0  . phenytoin (DILANTIN) 100 MG ER capsule Take 3 capsules every morning 270 capsule 3   No current facility-administered medications on file prior to visit.    Allergies: No Known Allergies Past Medical History:  Past Medical History:  Diagnosis Date  . ACS (acute coronary syndrome) ([place])   . Astrocytoma of frontal lobe ([place]) 05/31/2013  . Coronary artery disease   . DM (diabetes mellitus) ([place]) 08/09/2011   A1c at diagnosis = 12.4  . Family history of breast cancer   . GERD (gastroesophageal reflux disease) 08/09/2011  . H/O heart artery stent 07/14/2018  . Hyperlipidemia   . LAD stenosis   . Non-ST elevation (NSTEMI) myocardial infarction ([place]) 07/15/2019  . Nonsustained ventricular tachycardia ([place])   . NSVT (nonsustained ventricular tachycardia) ([place])   . Seizure ([place]) 05/19/2013  . Shortness of breath    Past Surgical History:  Past Surgical History:  Procedure Laterality Date  . APPENDECTOMY    . BRAIN BIOPSY N/A 05/24/2013   Procedure: Craniotomy for open biopsy;  Surgeon: Winfield Cunas, MD;  Location: [facility];  Service: Neurosurgery;  Laterality: N/A;  Craniotomy for open biopsy  . CARDIAC CATHETERIZATION    . CORONARY ANGIOPLASTY WITH STENT PLACEMENT    . CORONARY/GRAFT ACUTE MI REVASCULARIZATION N/A 07/15/2019   Procedure: Coronary/Graft Acute MI Revascularization;  Surgeon: Adrian Prows, MD;  Location: [facility];  Service: Cardiovascular;  Laterality: N/A;  . LEFT HEART CATH AND CORONARY ANGIOGRAPHY N/A 07/15/2019   Procedure: LEFT HEART CATH AND CORONARY ANGIOGRAPHY;  Surgeon: Adrian Prows, MD;  Location: [facility];  Service: Cardiovascular;  Laterality: N/A;   Social History:  Social History   Socioeconomic History  . Marital status: Single    Spouse name: Not on file  . Number of children: 2  . Years of education: Not on file  . Highest education level: Not on file   Occupational History    Employer: [employer]  Tobacco Use  . Smoking status: Current Every Day Smoker    Packs/day: 1.00    Years: 33.00    Pack years: 33.00    Types: Cigarettes  . Smokeless tobacco: Never Used  . Tobacco comment: Trying to quit. Down to 1 ppd, 10/11/13 down to 1/2 ppd  Substance and Sexual Activity  . Alcohol use: Not Currently    Alcohol/week: 0.0 standard drinks    Comment: social  . Drug use: Yes    Types: Marijuana    Comment: Marijuana daily  . Sexual activity: Not on file  Other Topics Concern  . Not on file  Social History Narrative   [place]. He does not work now.    Right handed      Highest level of edu- 11th grade      Lives in one story home. Steps to  enter home   Social Determinants of Health   Financial Resource Strain:   . Difficulty of Paying Living Expenses:   Food Insecurity:   . Worried About Charity fundraiser in the Last Year:   . Arboriculturist in the Last Year:   Transportation Needs:   . Film/video editor (Medical):   Marland Kitchen Lack of Transportation (Non-Medical):   Physical Activity:   . Days of Exercise per Week:   . Minutes of Exercise per Session:   Stress:   . Feeling of Stress :   Social Connections:   . Frequency of Communication with Friends and Family:   . Frequency of Social Gatherings with Friends and Family:   . Attends Religious Services:   . Active Member of Clubs or Organizations:   . Attends Archivist Meetings:   Marland Kitchen Marital Status:   Intimate Partner Violence:   . Fear of Current or Ex-Partner:   . Emotionally Abused:   Marland Kitchen Physically Abused:   . Sexually Abused:    Family History:  Family History  Problem Relation Age of Onset  . Breast cancer Mother        dx in her 30s-60s  . Bone cancer Mother   . Diabetes Father   . Heart disease Father   . Diabetes Brother   . Diabetes Paternal Uncle     Review of Systems: Constitutional: Denies fevers,  chills or abnormal weight loss Eyes: Denies blurriness of vision Ears, nose, mouth, throat, and face: Denies mucositis or sore throat Respiratory: Denies cough, dyspnea or wheezes Cardiovascular: Denies palpitation, chest discomfort or lower extremity swelling Gastrointestinal:  Denies nausea, constipation, diarrhea GU: Denies dysuria or incontinence Skin: Denies abnormal skin rashes Neurological: Per HPI Musculoskeletal: Denies joint pain, back or neck discomfort. No decrease in ROM Behavioral/Psych: Denies anxiety, disturbance in thought content, and mood instability  Physical Exam: Vitals:   07/31/19 1156  BP: 104/70  Pulse: (!) 115  Resp: 18  Temp: 98.7 F (37.1 C)  SpO2: 100%   KPS: 90. General: Alert, cooperative, pleasant, in no acute distress Head: Normal EENT: No conjunctival injection or scleral icterus. Oral mucosa moist Lungs: Resp effort normal Cardiac: Regular rate and rhythm Abdomen: Soft, non-distended abdomen Skin: No rashes cyanosis or petechiae. Extremities: No clubbing or edema  Neurologic Exam: Mental Status: Awake, alert, attentive to examiner. Oriented to self and environment. Language is fluent with intact comprehension.  Cranial Nerves: Visual acuity is grossly normal. Visual fields are full. Extra-ocular movements intact. No ptosis. Face is symmetric, tongue midline. Motor: Tone and bulk are normal. Power is full in both arms and legs. Reflexes are symmetric, no pathologic reflexes present. Intact finger to nose bilaterally Sensory: Intact to light touch and temperature Gait: Normal and tandem gait is normal.   Labs: I have reviewed the data as listed    Component Value Date/Time   NA 138 07/24/2019 1034   NA 139 01/16/2014 1035   K 4.4 07/24/2019 1034   K 4.1 01/16/2014 1035   CL 100 07/24/2019 1034   CO2 22 07/24/2019 1034   CO2 24 01/16/2014 1035   GLUCOSE 189 (H) 07/24/2019 1034   GLUCOSE 156 (H) 07/16/2019 0228   GLUCOSE 188 (H)  01/16/2014 1035   BUN 12 07/24/2019 1034   BUN 11.6 01/16/2014 1035   CREATININE 0.71 (L) 07/24/2019 1034   CREATININE 1.1 01/16/2014 1035   CALCIUM 9.4 07/24/2019 1034   CALCIUM 9.5 01/16/2014 1035  PROT 7.1 11/16/2013 0921   ALBUMIN 3.9 11/16/2013 0921   AST 23 11/16/2013 0921   ALT 35 11/16/2013 0921   ALKPHOS 83 11/16/2013 0921   BILITOT 0.27 11/16/2013 0921   GFRNONAA 108 07/24/2019 1034   GFRAA 125 07/24/2019 1034   Lab Results  Component Value Date   WBC 21.8 (H) 07/16/2019   NEUTROABS 5.7 11/16/2013   HGB 14.5 07/16/2019   HCT 42.4 07/16/2019   MCV 89.5 07/16/2019   PLT 299 07/16/2019    Imaging: [facility] Clinician Interpretation: I have personally reviewed the CNS images as listed.  My interpretation, in the context of the patient's clinical presentation, is treatment effect vs true progression  DG Chest 2 View  Result Date: 07/14/2019 CLINICAL DATA:  Chest pain. EXAM: CHEST - 2 VIEW COMPARISON:  June 21, 2018 FINDINGS: The heart size and mediastinal contours are within normal limits. Both lungs are clear. The visualized skeletal structures are unremarkable. IMPRESSION: No active cardiopulmonary disease. Electronically Signed   By: Virgina Norfolk M.D.   On: 07/14/2019 22:02   MR Brain W Wo Contrast  Result Date: 07/13/2019 CLINICAL DATA:  Grade 2 astrocytoma with prior radiation treatment. Short-term follow-up. Progressive foot pain. EXAM: MRI HEAD WITHOUT AND WITH CONTRAST TECHNIQUE: Multiplanar, multiecho pulse sequences of the brain and surrounding structures were obtained without and with intravenous contrast. CONTRAST:  69m MULTIHANCE GADOBENATE DIMEGLUMINE 529 MG/ML IV SOLN COMPARISON:  Multiple prior MRIs of the head, most recently 05/18/2019 and 10/27/2018. FINDINGS: Brain: The 2 focal areas of enhancement which raise concern on the most recent study in the anterior right frontal lobe have both increased in size. The more lateral focus has increased from just  under 3 mm to just over 4 mm. More inferior and medially the lesion the previously measured 2 mm now measures just over 3 mm. Minimal increase in T2 signal change about the more inferior lesion is noted compared with the June 26th exam. Subtle enhancement in the anterior left frontal lobe is stable and may be vascular. No other distal enhancement is present. No restricted diffusion is associated with either lesion. The cystic foci in the right frontal lobe are stable. Postoperative and post radiation changes are otherwise stable. Anterior periventricular white matter changes on the left likely related to prior treatment. Remote insults are again noted in the corpus callosum. The internal auditory canals are within normal limits. The brainstem and cerebellum are within normal limits. No other abnormal enhancement is present. Vascular: Flow is present in the major intracranial arteries. Skull and upper cervical spine: The craniocervical junction is normal. Upper cervical spine is within normal limits. Marrow signal is unremarkable. Sinuses/Orbits: Minimal fluid is present in the posterior left maxillary sinus, stable. The paranasal sinuses and mastoid air cells are otherwise clear. The globes and orbits are within normal limits. IMPRESSION: 1. The 2 focal areas of enhancement in the anterior right frontal lobe have both increased in size since the June 26th exam. This is concerning for progression of disease. 2. Stable post radiation changes in the right frontal lobe and corpus callosum. 3. No other acute intracranial abnormality. 4. Minimal fluid in the posterior left maxillary sinus is stable. Electronically Signed   By: CSan MorelleM.D.   On: 07/13/2019 16:47   CARDIAC CATHETERIZATION  Result Date: 07/15/2019 Left Heart Catheterization 07/15/19: LV: Mid to distal anterior, anterolateral and apical akinesis, EF 35 to at most 40%.  Hand contrast injection, not well visualized.  Normal LVEDP. RCA:  Dominant, mild diffuse disease. Left main: Mildly calcified. LAD: Diffusely diseased proximal segment, mid segment had a 90% stenosis.  Mid to distal segment has minimal disease and apical LAD had 99% stenosis.  Successful PTCA and stenting of the proximal/ostial and mid LAD with implantation of a long 3.0 x 38 mm resolute Onyx DES postdilated with 3.5 x 12 mm noncompliant sapphire balloon.  90% stenosis reduced to 0% with TIMI-3 to TIMI-3 flow.  Apical LAD stenosis left alone. Ramus intermediate: Large vessel, ostium has ulcerated appearing 99% stenosis.  Upon angioplasty realized it was also calcified and probably chronic.  Successful PTCA and stenting with 2 overlapping 2.5 x 15 and distally 2.5 x 13 mm resolute Onyx DES.  9 9% stenosis reduced to 0% with TIMI-3 to TIMI-3 flow. Circumflex: Moderate to large vessel, proximal to mid segment is diffusely diseased 30%.  Large OM1 with proximal long 60% to at most 70% stenosis. Recommendation: Patient will be started on low-dose beta-blocker for now due to poor tolerance and if blood pressure is stable will escalate the dose.  He was also started on low-dose ACE inhibitor's and high intensity high-dose atorvastatin.  He will receive aspirin indefinitely and Brilinta for at least 1 year in view of ACS. Patient has astrocytoma and abnormal MRI, we will have to discuss with his oncologist regarding any contraindication for antiplatelet use.  I did not think surgery would be more appropriate in the situation where high-dose heparin had to be utilized and potential for intracranial bleeding.  Also patient had nonsustained VT just prior to angioplasty while we were reviewing the angiogram and hence the lesion was felt to be very unstable when I decided to proceed with angioplasty.  ECHOCARDIOGRAM COMPLETE  Result Date: 07/15/2019    ECHOCARDIOGRAM REPORT   Patient Name:   Jonathan Barker [facility] Date of Exam: 07/15/2019 Medical Rec #:  [id number]          Height:       71.0 in  Accession #:    [id number]         Weight:       172.0 lb Date of Birth:  08/05/1965          BSA:          1.977 m Patient Age:    61 years           BP:           112/71 mmHg Patient Gender: M                  HR:           92 bpm. Exam Location:  Inpatient Procedure: 2D Echo Indications:     R07.9* Chest pain, unspecified; I25.700 Atherosclerosis of                  coronary artery bypass graft(s), unspecified, with unstable                  angina pectoris  History:         Patient has no prior history of Echocardiogram examinations.  Sonographer:     Roseanna Rainbow RDCS Referring Phys:  [place] Diagnosing Phys: Adrian Prows MD IMPRESSIONS  1. Left ventricular ejection fraction, by estimation, is 30 to 35%. The left ventricle has moderate to severely decreased function. The left ventricle demonstrates regional wall motion abnormalities (see scoring diagram/findings for description). There is mild left ventricular hypertrophy. Left ventricular diastolic parameters are  consistent with Grade I diastolic dysfunction (impaired relaxation). Entire mid to distal anterior and antero septum and inferoseptum and apex is akinetic. No mural thrombus.  2. Right ventricular systolic function is normal. The right ventricular size is normal. There is normal pulmonary artery systolic pressure.  3. The mitral valve is normal in structure. Trivial mitral valve regurgitation.  4. The aortic valve is tricuspid. Aortic valve regurgitation is not visualized. Mild aortic valve sclerosis is present, with no evidence of aortic valve stenosis.  5. The inferior vena cava is normal in size with greater than 50% respiratory variability, suggesting right atrial pressure of 3 mmHg. FINDINGS  Left Ventricle: Left ventricular ejection fraction, by estimation, is 30 to 35%. The left ventricle has moderate to severely decreased function. The left ventricle demonstrates regional wall motion abnormalities. The left ventricular internal cavity size  was normal in size. There is mild left ventricular hypertrophy. Left ventricular diastolic parameters are consistent with Grade I diastolic dysfunction (impaired relaxation). Normal left ventricular filling pressure.  LV Wall Scoring: The mid and distal anterior wall, apical lateral segment, mid anterolateral segment, mid inferoseptal segment, apical septal segment, and apex are akinetic. Entire mid to distal anterior and antero septum and inferoseptum and apex is akinetic. No mural thrombus. Right Ventricle: The right ventricular size is normal. No increase in right ventricular wall thickness. Right ventricular systolic function is normal. There is normal pulmonary artery systolic pressure. The tricuspid regurgitant velocity is 2.46 m/s, and  with an assumed right atrial pressure of 3 mmHg, the estimated right ventricular systolic pressure is 39.0 mmHg. Left Atrium: Left atrial size was normal in size. Right Atrium: Right atrial size was normal in size. Pericardium: There is no evidence of pericardial effusion. Mitral Valve: The mitral valve is normal in structure. Trivial mitral valve regurgitation. Tricuspid Valve: The tricuspid valve is normal in structure. Tricuspid valve regurgitation is mild. Aortic Valve: The aortic valve is tricuspid. Aortic valve regurgitation is not visualized. Mild aortic valve sclerosis is present, with no evidence of aortic valve stenosis. Pulmonic Valve: The pulmonic valve was grossly normal. Pulmonic valve regurgitation is trivial. Aorta: The aortic root is normal in size and structure. Venous: The inferior vena cava is normal in size with greater than 50% respiratory variability, suggesting right atrial pressure of 3 mmHg. IAS/Shunts: No atrial level shunt detected by color flow Doppler.  LEFT VENTRICLE PLAX 2D LVIDd:         4.80 cm      Diastology LVIDs:         2.90 cm      LV e' medial:   5.77 cm/s LV PW:         1.10 cm      LV E/e' medial: 9.4 LV IVS:        1.30 cm LVOT diam:      1.70 cm LV SV:         34 LV SV Index:   17 LVOT Area:     2.27 cm  LV Volumes (MOD) LV vol d, MOD A2C: 114.0 ml LV vol d, MOD A4C: 129.0 ml LV vol s, MOD A2C: 71.2 ml LV vol s, MOD A4C: 74.5 ml LV SV MOD A2C:     42.8 ml LV SV MOD A4C:     129.0 ml LV SV MOD BP:      49.3 ml RIGHT VENTRICLE            IVC RV S prime:  6.85 cm/s  IVC diam: 1.40 cm LEFT ATRIUM             Index       RIGHT ATRIUM          Index LA diam:        1.90 cm 0.96 cm/m  RA Area:     8.08 cm LA Vol (A2C):   35.2 ml 17.80 ml/m RA Volume:   16.10 ml 8.14 ml/m LA Vol (A4C):   16.7 ml 8.45 ml/m LA Biplane Vol: 26.2 ml 13.25 ml/m  AORTIC VALVE             PULMONIC VALVE LVOT Vmax:   94.60 cm/s  PV Vmax:       2.46 m/s LVOT Vmean:  68.800 cm/s PV Peak grad:  24.2 mmHg LVOT VTI:    0.151 m  AORTA Ao Root diam: 3.80 cm MITRAL VALVE               TRICUSPID VALVE MV Area (PHT): 5.38 cm    TR Peak grad:   24.2 mmHg MV Decel Time: 141 msec    TR Vmax:        246.00 cm/s MV E velocity: 54.00 cm/s MV A velocity: 69.80 cm/s  SHUNTS MV E/A ratio:  0.77        Systemic VTI:  0.15 m                            Systemic Diam: 1.70 cm Adrian Prows MD Electronically signed by Adrian Prows MD Signature Date/Time: 07/15/2019/1:58:53 PM    Final       Assessment/Plan 1. Astrocytoma of frontal lobe ([place])   Jonathan Barker is neurologically stable at this time.  MRI demonstrates subtle progression of two enhancing foci within bed of T2 signal abnormality.  These foci represent radioinflammatory changes or early progressive or transformed tumor.  They are not amenable to biopsy.  After discussion of risk/benefits of possible interventions, including initiating adjuvant Temozolomide (never completed this in 2015), we ultimately recommend he return in 3 months with an additional MRI brain for evaluation.  Dr. Delice Lesch will continue to prescribe Dilantin for now, although this may need to be amended if he initiates cytotoxic chemo due to risk of  cytopenias.   All questions were answered. The patient knows to call the clinic with any problems, questions or concerns. No barriers to learning were detected.  The total time spent in the encounter was 40 minutes and more than 50% was on counseling and review of test results   Ventura Sellers, MD Medical Director of Neuro-Oncology [facility] 07/31/19 12:03 PM

## 2019-08-01 ENCOUNTER — Telehealth: Payer: Self-pay | Admitting: Internal Medicine

## 2019-08-01 NOTE — Telephone Encounter (Signed)
Scheduled appt per 3/30 los.  Sent a message to HIM Pool to get a calendar mailed out.

## 2019-08-03 LAB — CYTOCHROME P450 2C19

## 2019-08-09 ENCOUNTER — Encounter (HOSPITAL_COMMUNITY): Payer: Self-pay

## 2019-08-09 ENCOUNTER — Telehealth (HOSPITAL_COMMUNITY): Payer: Self-pay

## 2019-08-09 NOTE — Telephone Encounter (Signed)
Attempted to call patient in regards to Cardiac Rehab - LM on VM Mailed letter 

## 2019-08-10 ENCOUNTER — Other Ambulatory Visit: Payer: Self-pay | Admitting: Radiation Therapy

## 2019-08-13 MED FILL — GABAPENTIN 300 MG CAPSULE: 300 | 30 days supply | Qty: 30 | Fill #2

## 2019-08-20 ENCOUNTER — Other Ambulatory Visit: Payer: Self-pay

## 2019-08-20 ENCOUNTER — Encounter: Payer: Self-pay | Admitting: Cardiology

## 2019-08-20 ENCOUNTER — Ambulatory Visit: Payer: Medicare Other | Admitting: Cardiology

## 2019-08-20 ENCOUNTER — Other Ambulatory Visit: Payer: Self-pay | Admitting: Cardiology

## 2019-08-20 VITALS — BP 159/129 | HR 90 | Temp 97.6°F | Resp 15 | Ht 72.0 in | Wt 163.0 lb

## 2019-08-20 DIAGNOSIS — Z720 Tobacco use: Secondary | ICD-10-CM | POA: Diagnosis not present

## 2019-08-20 DIAGNOSIS — Z95820 Peripheral vascular angioplasty status with implants and grafts: Secondary | ICD-10-CM | POA: Diagnosis not present

## 2019-08-20 DIAGNOSIS — I472 Ventricular tachycardia: Secondary | ICD-10-CM

## 2019-08-20 DIAGNOSIS — C711 Malignant neoplasm of frontal lobe: Secondary | ICD-10-CM | POA: Diagnosis not present

## 2019-08-20 DIAGNOSIS — E782 Mixed hyperlipidemia: Secondary | ICD-10-CM

## 2019-08-20 DIAGNOSIS — I255 Ischemic cardiomyopathy: Secondary | ICD-10-CM | POA: Diagnosis not present

## 2019-08-20 DIAGNOSIS — I2510 Atherosclerotic heart disease of native coronary artery without angina pectoris: Secondary | ICD-10-CM | POA: Diagnosis not present

## 2019-08-20 DIAGNOSIS — I4729 Other ventricular tachycardia: Secondary | ICD-10-CM

## 2019-08-20 DIAGNOSIS — I214 Non-ST elevation (NSTEMI) myocardial infarction: Secondary | ICD-10-CM

## 2019-08-20 DIAGNOSIS — E1159 Type 2 diabetes mellitus with other circulatory complications: Secondary | ICD-10-CM | POA: Diagnosis not present

## 2019-08-20 DIAGNOSIS — Z794 Long term (current) use of insulin: Secondary | ICD-10-CM | POA: Diagnosis not present

## 2019-08-20 MED ORDER — METOPROLOL SUCCINATE ER 100 MG PO TB24: 100.0000 mg | ORAL_TABLET | Freq: Every morning | ORAL | 0 refills | Status: DC

## 2019-08-20 NOTE — Progress Notes (Signed)
Jonathan Barker Date of Birth: 09-14-1965 MRN: [id number] Primary Care Provider:Guilloud, Hoyle Sauer, MD Former Cardiology Providers: Dr. Adrian Prows Primary Cardiologist: Rex Kras, DO (March 2021)  Date: 08/20/19 Last Office Visit: 07/23/2019  Chief Complaint  Patient presents with  . Follow-up  . Results   HPI  Jonathan Barker is a 54 y.o. male who presents to the office with a chief complaint of "hospital follow-up, since heart disease, recent stent." Patient's past medical history and cardiac risk factors include: Recent non-STEMI, established coronary artery disease with PCI, hypertension, hyperlipidemia, insulin-dependent diabetes mellitus type 2, nonsustained ventricular tachycardia, active tobacco use, history of astrocytoma.  Patient was admitted to the hospital in March 2021 for chest pain but did not meet STEMI criteria.  However due to ongoing chest discomfort and elevated cardiac biomarkers STEMI team was activated and patient was taken to the Cath Lab.  Patient underwent left heart catheterization with successful PTCA and stenting as noted below.  He was started on guideline directed medical therapy and is here for follow-up.  At the last office visit patient was doing well and his medications were titrated.  Since last office visit patient states that he does not have any chest pain at rest or with effort related activities.  He has not required any sublingual nitroglycerin tablets.  He continues to be on dual antiplatelet therapy.  He has not started cardiac rehab yet but was willing to prior to playing golf in the near future.  He continues to smoke approximately 5 cigarettes/day but is making efforts to cut down on a weekly basis.  Given ischemic cardiomyopathy recommended guideline directed medical therapy and to reevaluate LV function in 90 days.  He is currently on metoprolol and lisinopril.  Lisinopril was reduced secondary to hypotension.  Patient had a 48-hour  Holter monitor to reevaluate NSVT burden since he had episodes of it during his hospitalization.  Most recent 48-hour Holter shows supraventricular and ventricular ectopic burden less than 1% and no evidence of NSVT.  Difficult to uptitrate guideline directed medical therapy given his hemodynamic values.  But despite the low blood pressure he is asymptomatic.  Patient serum glucose levels are improving.  Will defer management to primary team.  History of established coronary artery disease with prior PCI, recent non-STEMI, ischemic cardiomyopathy. Denies prior history of congestive heart failure, deep venous thrombosis, pulmonary embolism, stroke, transient ischemic attack.  ALLERGIES: No Known Allergies   MEDICATION LIST PRIOR TO VISIT: Current Outpatient Medications on File Prior to Visit  Medication Sig Dispense Refill  . aspirin 81 MG chewable tablet Chew 1 tablet (81 mg total) by mouth daily. 90 tablet 3  . atorvastatin (LIPITOR) 80 MG tablet Take 1 tablet (80 mg total) by mouth daily at 6 PM. 90 tablet 3  . blood glucose meter kit and supplies KIT Dispense based on patient and insurance preference. Use up to four times daily as directed. (FOR ICD-9 250.00, 250.01). 1 each 0  . Blood Pressure Monitoring (BLOOD PRESSURE CUFF) MISC 1 each by Does not apply route daily as needed. 1 each 0  . clopidogrel (PLAVIX) 75 MG tablet Take 1 tablet (75 mg total) by mouth daily. 90 tablet 3  . Empagliflozin-metFORMIN HCl 12.08-998 MG TABS Take 1 tablet by mouth 2 (two) times daily. 180 tablet 1  . gabapentin (NEURONTIN) 300 MG capsule Take 1 capsule (300 mg total) by mouth at bedtime. (Patient taking differently: Take 300 mg by mouth daily. ) 30 capsule 11  .  Insulin Glargine (BASAGLAR KWIKPEN) 100 UNIT/ML Inject 0.12 mLs (12 Units total) into the skin daily. 15 mL 0  . Insulin Pen Needle (PEN NEEDLES) 32G X 4 MM MISC 1 pen by Does not apply route as directed. 100 each 11  . lisinopril (ZESTRIL) 5 MG  tablet Take 1 tablet (5 mg total) by mouth daily. (Patient taking differently: Take 2.5 mg by mouth daily. ) 90 tablet 3  . magnesium oxide (MAG-OX) 400 (241.3 Mg) MG tablet Take 0.5 tablets (200 mg total) by mouth 2 (two) times daily. 90 tablet 1  . nitroGLYCERIN (NITROSTAT) 0.4 MG SL tablet Place 1 tablet (0.4 mg total) under the tongue every 5 (five) minutes x 3 doses as needed for chest pain. 90 tablet 3  . oxyCODONE-acetaminophen (PERCOCET/ROXICET) 5-325 MG tablet TAKE 1 TABLET BY MOUTH AT BEDTIME. MAY REPEAT 1 ADDITIONAL TABLET AS NEEDED 90 tablet 0  . phenytoin (DILANTIN) 100 MG ER capsule Take 3 capsules every morning 270 capsule 3   No current facility-administered medications on file prior to visit.    PAST MEDICAL HISTORY: Past Medical History:  Diagnosis Date  . ACS (acute coronary syndrome) ([place])   . Astrocytoma of frontal lobe ([place]) 05/31/2013  . Coronary artery disease   . DM (diabetes mellitus) ([place]) 08/09/2011   A1c at diagnosis = 12.4  . Family history of breast cancer   . GERD (gastroesophageal reflux disease) 08/09/2011  . H/O heart artery stent 07/14/2018  . Hyperlipidemia   . LAD stenosis   . Non-ST elevation (NSTEMI) myocardial infarction ([place]) 07/15/2019  . Nonsustained ventricular tachycardia ([place])   . NSVT (nonsustained ventricular tachycardia) ([place])   . Seizure ([place]) 05/19/2013  . Shortness of breath     PAST SURGICAL HISTORY: Past Surgical History:  Procedure Laterality Date  . APPENDECTOMY    . BRAIN BIOPSY N/A 05/24/2013   Procedure: Craniotomy for open biopsy;  Surgeon: Winfield Cunas, MD;  Location: [facility];  Service: Neurosurgery;  Laterality: N/A;  Craniotomy for open biopsy  . CARDIAC CATHETERIZATION    . CORONARY ANGIOPLASTY WITH STENT PLACEMENT    . CORONARY/GRAFT ACUTE MI REVASCULARIZATION N/A 07/15/2019   Procedure: Coronary/Graft Acute MI Revascularization;  Surgeon: Adrian Prows, MD;  Location: [facility];  Service: Cardiovascular;   Laterality: N/A;  . LEFT HEART CATH AND CORONARY ANGIOGRAPHY N/A 07/15/2019   Procedure: LEFT HEART CATH AND CORONARY ANGIOGRAPHY;  Surgeon: Adrian Prows, MD;  Location: [facility];  Service: Cardiovascular;  Laterality: N/A;    FAMILY HISTORY: The patient family history includes Bone cancer in his mother; Breast cancer in his mother; Diabetes in his brother, father, and paternal uncle; Heart disease in his father.   SOCIAL HISTORY:  The patient  reports that he has been smoking cigarettes. He has a 33.00 pack-year smoking history. He has never used smokeless tobacco. He reports previous alcohol use. He reports current drug use. Drug: Marijuana.  Review of Systems  Constitution: Negative for chills and fever.  HENT: Negative for ear discharge, ear pain and nosebleeds.   Eyes: Negative for blurred vision and discharge.  Cardiovascular: Negative for chest pain, claudication, dyspnea on exertion, leg swelling, near-syncope, orthopnea, palpitations, paroxysmal nocturnal dyspnea and syncope.  Respiratory: Negative for cough and shortness of breath.   Endocrine: Negative for polydipsia, polyphagia and polyuria.  Hematologic/Lymphatic: Negative for bleeding problem.  Skin: Negative for flushing and nail changes.  Musculoskeletal: Negative for muscle cramps, muscle weakness and myalgias.  Gastrointestinal: Negative  for abdominal pain, dysphagia, hematemesis, hematochezia, melena, nausea and vomiting.  Neurological: Negative for dizziness, focal weakness and light-headedness.   PHYSICAL EXAM: Vitals with BMI 08/20/2019 07/31/2019 07/25/2019  Height 6' 0"  5' 11"  5' 11"   Weight 163 lbs 162 lbs 3 oz 165 lbs 10 oz  BMI 22.1 37.34 28.76  Systolic 811 572 93  Diastolic 620 70 51  Pulse 90 115 100   Orthostatic VS for the past 72 hrs (Last 3 readings):  Orthostatic BP Patient Position BP Location Cuff Size  08/20/19 1335 91/74 Standing Left Arm Normal  08/20/19 1334 106/70 Sitting Left Arm  Normal  08/20/19 1333 97/68 Supine Left Arm Normal   Constitutional: He isoriented to person, place, and time. He appearswell-developedand well-nourished.No distress.  HENT:  Head:Normocephalicand atraumatic.  Eyes: Right eye exhibitsno discharge. Left eye exhibitsno discharge.No scleral icterus.  Neck:No JVDpresent. No tracheal deviationpresent.  Cardiovascular:Normal rateand regular rhythm. Exam revealsno friction rub. No murmurheard. Pulmonary/Chest:Breath sounds normal. Norespiratory distress. He hasno wheezes. He hasno rales.  Abdominal:Soft.Bowel sounds are normal. He exhibitsno distension. There isno abdominal tenderness. There isno reboundand no guarding.  Musculoskeletal:  General: No edema.Normal range of motion.  Cervical back: Normal range of motionand neck supple.  Neurological: He is alertand oriented to person, place, and time. Nocranial nerve deficit.  Skin: Skin isdry.No rashnoted. He is not diaphoretic. Noerythema.  Psychiatric: He has anormal mood and affect. Hisbehavior is normal.Judgmentand thought contentnormal.   Cardiac Studies: EKG  07/14/2019: Normal sinus rhythm, no evidence of ischemia. Compared to prior EKG done a few minutes ago, minimal ST elevation in V1 through V3 which does not meet STEMI criteria appears to have improved.  07/23/2019: Sinus  Rhythm ventricular rate of 86 bpm, right axis deviation, old anterior infarct (age undetermined), T wave inversions in the lateral leads V4-V6, lead I and aVL suggestive of anterolateral ischemia.  Compared to prior EKG dated 07/15/2019 patient continues to be in normal sinus rhythm with T wave inversions in leads V4-V6 are new.  Echocardiogram: 07/15/2019: LVEF 30-35%, moderate to severely decreased function, with regional wall motion abnormalities, grade 1 diastolic impairment, no mural thrombus, no pulmonary hypertension.  Left Heart Catheterization 07/15/19:  LV: Mid to distal anterior, anterolateral and apical akinesis, EF 35 to at most 40%. Hand contrast injection, not well visualized. Normal LVEDP. RCA: Dominant, mild diffuse disease. Left main: Mildly calcified. LAD: Diffusely diseased proximal segment, mid segment had a 90% stenosis. Mid to distal segment has minimal disease and apical LAD had 99% stenosis. Successful PTCA and stenting of the proximal/ostial and mid LAD with implantation of a long 3.0 x 38 mm resolute Onyx DES postdilated with 3.5 x 12 mm noncompliant sapphire balloon. 90% stenosis reduced to 0% with TIMI-3 to TIMI-3 flow. Apical LAD stenosis left alone. Ramus intermediate: Large vessel, ostium has ulcerated appearing 99% stenosis. Upon angioplasty realized it was also calcified and probably chronic. Successful PTCA and stenting with 2 overlapping 2.5 x 15 and distally 2.5 x 13 mm resolute Onyx DES. 9 9% stenosis reduced to 0% with TIMI-3 to TIMI-3 flow. Circumflex: Moderate to large vessel, proximal to mid segment is diffusely diseased 30%. Large OM1 with proximal long 60% to at most 70% stenosis.  48 hour Holter monitor: Dominant rhythm normal sinus, followed by tachycardia (27% burden). Heart rate 84-125 bpm.  Average heart rate 97 bpm. No atrial fibrillation and no sinus pause greater than or equal to 2.5 seconds in duration. No episodes of nonsustained ventricular tachycardia. 7 isolated ventricular  ectopic beats, 0 ventricular pairs and 0 ventricular runs, total ventricular ectopic burden <0.01%. 11 isolated supraventricular ectopic beats, 0 supraventricular runs, total supraventricular ectopic burden 0.01%. No patient diary submitted as a part of the study.  LABORATORY DATA: CBC Latest Ref Rng & Units 07/16/2019 07/15/2019 07/14/2019  WBC 4.0 - 10.5 K/uL 21.8(H) 20.2(H) 17.0(H)  Hemoglobin 13.0 - 17.0 g/dL 14.5 17.1(H) 18.1(H)  Hematocrit 39.0 - 52.0 % 42.4 49.7 54.0(H)  Platelets 150 - 400 K/uL 299 330 345     CMP Latest Ref Rng & Units 07/24/2019 07/16/2019 07/15/2019  Glucose 65 - 99 mg/dL 189(H) 156(H) 184(H)  BUN 6 - 24 mg/dL 12 36(H) 10  Creatinine 0.76 - 1.27 mg/dL 0.71(L) 0.67 0.75  Sodium 134 - 144 mmol/L 138 141 139  Potassium 3.5 - 5.2 mmol/L 4.4 3.8 3.7  Chloride 96 - 106 mmol/L 100 112(H) 103  CO2 20 - 29 mmol/L 22 18(L) 24  Calcium 8.7 - 10.2 mg/dL 9.4 9.1 9.1  Total Protein 6.4 - 8.3 g/dL - - -  Total Bilirubin 0.20 - 1.20 mg/dL - - -  Alkaline Phos 40 - 150 U/L - - -  AST 5 - 34 U/L - - -  ALT 0 - 55 U/L - - -    Lipid Panel     Component Value Date/Time   CHOL 244 (H) 07/14/2019 2336   TRIG 126 07/14/2019 2336   HDL 43 07/14/2019 2336   CHOLHDL 5.7 07/14/2019 2336   VLDL 25 07/14/2019 2336   LDLCALC 176 (H) 07/14/2019 2336    Lab Results  Component Value Date   HGBA1C 10.6 (H) 07/14/2019   HGBA1C 10.7 (A) 03/27/2018   HGBA1C 9.4 06/27/2017   No components found for: NTPROBNP Lab Results  Component Value Date   TSH 1.073 07/18/2011    FINAL MEDICATION LIST END OF ENCOUNTER: Meds ordered this encounter  Medications  . metoprolol succinate (TOPROL-XL) 100 MG 24 hr tablet    Sig: Take 1 tablet (100 mg total) by mouth in the morning. Take with or immediately following a meal. Hold if systolic blood pressure (top blood pressure number) less than 100 mmHg or heart rate less than 60 bpm (pulse).    Dispense:  90 tablet    Refill:  0    Medications Discontinued During This Encounter  Medication Reason  . metoprolol tartrate (LOPRESSOR) 50 MG tablet Change in therapy     Current Outpatient Medications:  .  aspirin 81 MG chewable tablet, Chew 1 tablet (81 mg total) by mouth daily., Disp: 90 tablet, Rfl: 3 .  atorvastatin (LIPITOR) 80 MG tablet, Take 1 tablet (80 mg total) by mouth daily at 6 PM., Disp: 90 tablet, Rfl: 3 .  blood glucose meter kit and supplies KIT, Dispense based on patient and insurance preference. Use up to four times daily as directed. (FOR  ICD-9 250.00, 250.01)., Disp: 1 each, Rfl: 0 .  Blood Pressure Monitoring (BLOOD PRESSURE CUFF) MISC, 1 each by Does not apply route daily as needed., Disp: 1 each, Rfl: 0 .  clopidogrel (PLAVIX) 75 MG tablet, Take 1 tablet (75 mg total) by mouth daily., Disp: 90 tablet, Rfl: 3 .  Empagliflozin-metFORMIN HCl 12.08-998 MG TABS, Take 1 tablet by mouth 2 (two) times daily., Disp: 180 tablet, Rfl: 1 .  gabapentin (NEURONTIN) 300 MG capsule, Take 1 capsule (300 mg total) by mouth at bedtime. (Patient taking differently: Take 300 mg by mouth daily. ), Disp: 30 capsule, Rfl: 11 .  Insulin Glargine (BASAGLAR KWIKPEN) 100 UNIT/ML, Inject 0.12 mLs (12 Units total) into the skin daily., Disp: 15 mL, Rfl: 0 .  Insulin Pen Needle (PEN NEEDLES) 32G X 4 MM MISC, 1 pen by Does not apply route as directed., Disp: 100 each, Rfl: 11 .  lisinopril (ZESTRIL) 5 MG tablet, Take 1 tablet (5 mg total) by mouth daily. (Patient taking differently: Take 2.5 mg by mouth daily. ), Disp: 90 tablet, Rfl: 3 .  magnesium oxide (MAG-OX) 400 (241.3 Mg) MG tablet, Take 0.5 tablets (200 mg total) by mouth 2 (two) times daily., Disp: 90 tablet, Rfl: 1 .  nitroGLYCERIN (NITROSTAT) 0.4 MG SL tablet, Place 1 tablet (0.4 mg total) under the tongue every 5 (five) minutes x 3 doses as needed for chest pain., Disp: 90 tablet, Rfl: 3 .  oxyCODONE-acetaminophen (PERCOCET/ROXICET) 5-325 MG tablet, TAKE 1 TABLET BY MOUTH AT BEDTIME. MAY REPEAT 1 ADDITIONAL TABLET AS NEEDED, Disp: 90 tablet, Rfl: 0 .  phenytoin (DILANTIN) 100 MG ER capsule, Take 3 capsules every morning, Disp: 270 capsule, Rfl: 3 .  metoprolol succinate (TOPROL-XL) 100 MG 24 hr tablet, Take 1 tablet (100 mg total) by mouth in the morning. Take with or immediately following a meal. Hold if systolic blood pressure (top blood pressure number) less than 100 mmHg or heart rate less than 60 bpm (pulse)., Disp: 90 tablet, Rfl: 0  IMPRESSION:    ICD-10-CM   1. Coronary artery disease  involving native coronary artery of native heart without angina pectoris  I25.10 EKG 12-Lead    metoprolol succinate (TOPROL-XL) 100 MG 24 hr tablet  2. Non-ST elevation (NSTEMI) myocardial infarction (HCC)  I21.4 PCV ECHOCARDIOGRAM COMPLETE    metoprolol succinate (TOPROL-XL) 100 MG 24 hr tablet  3. S/P angioplasty with stent  Z95.820 PCV ECHOCARDIOGRAM COMPLETE    metoprolol succinate (TOPROL-XL) 100 MG 24 hr tablet  4. Mixed hyperlipidemia  E78.2 Lipid Panel With LDL/HDL Ratio  5. Tobacco use  Z72.0   6. Type 2 diabetes mellitus with other circulatory complication, with long-term current use of insulin (HCC)  E11.59    Z79.4   7. Astrocytoma of frontal lobe (HCC)  C71.1   8. Nonsustained ventricular tachycardia (HCC)  I47.2   9. Ischemic cardiomyopathy  I25.5 PCV ECHOCARDIOGRAM COMPLETE     RECOMMENDATIONS: Jonathan Barker is a 54 y.o. male whose past medical history and cardiac risk factors include: History of astrocytoma, non-STEMI, nonsustained ventricular tachycardia, established coronary artery disease a status post angioplasty and stenting, ischemic cardiomyopathy, uncontrolled diabetes mellitus type 2, hyperlipidemia, tobacco use.  Established coronary artery disease status post stenting:  Continue dual antiplatelet therapy.  Denies any recent anginal discomfort.  No use of sublingual nitroglycerin tablets.  Patient will be starting cardiac rehab.  EKG continues to show T wave inversions in the high lateral lateral leads but patient is asymptomatic.  Continue to monitor.  Discontinue Lopressor 50 mg p.o. twice daily.  Transition to Toprol-XL 100 mg p.o. q. a.m.  Most recent Holter monitor notes sinus rhythm with tachycardic burden 27%.  If his vital signs remained stable will consider uptitrating beta blocker therapy.  Continue ACE inhibitors.  Continue with guideline directed medical therapy.  Currently not uptitrating medical therapy as his systolic blood pressures  are less than 100 mmHg but remains asymptomatic.  Ischemic cardiomyopathy:   Management as discussed above.  Ejection Fraction noted on last 2D Echo, around October 16, 2019  Recommend daily weight check, strict I/O's  Fluid restriction to <  2L per day, Na restriction < 2g per day  Will follow up EF after optimizing and maintaining GDMT for 3 months for evaluation of ICD implantation for primary prevention of SCD  Nonsustained ventricular tachycardia:  48-hour Holter monitor is not show evidence of nonsustained ventricular tachycardia.  Transition from Lopressor to Toprol-XL.  I will increase dosage if hemodynamics allow.    Active tobacco use:  Patient continues to smoke but has decreased since last office visit.  He is now smoking 5 cigarettes/day and he has intentions of quitting at the next office visit.  Insulin-dependent diabetes mellitus type 2:  Patient was noted to have poorly controlled diabetes mellitus type 2.  He was started on insulin prior to discharge.  Currently on Metformin/SGLT 2 inhibitors.  Patient has appointment with PCP later this week.   Mixed hyperlipidemia:  Currently on high intensity statin therapy after his non-STEMI.  Fasting lipid profile ordered for October 16, 2019 prior to the next office visit.  Ordered and released.  Orders Placed This Encounter  Procedures  . Lipid Panel With LDL/HDL Ratio  . EKG 12-Lead  . PCV ECHOCARDIOGRAM COMPLETE   --Continue cardiac medications as reconciled in final medication list. --Return in about 2 months (around 11/01/2019) for re-evaluation of symptoms., review test results., Lipid follow-up.. Or sooner if needed. --Continue follow-up with your primary care physician regarding the management of your other chronic comorbid conditions.  Patient's questions and concerns were addressed to his satisfaction. He voices understanding of the instructions provided during this encounter.   This note was created using  a voice recognition software as a result there may be grammatical errors inadvertently enclosed that do not reflect the nature of this encounter. Every attempt is made to correct such errors.  Rex Kras, DO, [facility]. [place] Office: [phone]

## 2019-08-23 ENCOUNTER — Telehealth (HOSPITAL_COMMUNITY): Payer: Self-pay

## 2019-08-23 NOTE — Telephone Encounter (Signed)
Unable to reach pt in regards to cardiac rehab. °Closed referral. °

## 2019-09-05 ENCOUNTER — Other Ambulatory Visit: Payer: Self-pay

## 2019-09-05 ENCOUNTER — Other Ambulatory Visit: Payer: Self-pay | Admitting: Neurology

## 2019-09-05 MED ORDER — GABAPENTIN 300 MG PO CAPS: 300.0000 mg | ORAL_CAPSULE | Freq: Every day | ORAL | 3 refills | Status: DC

## 2019-09-06 ENCOUNTER — Other Ambulatory Visit: Payer: Self-pay | Admitting: Urology

## 2019-09-06 MED ORDER — OXYCODONE-ACETAMINOPHEN 5-325 MG PO TABS: ORAL_TABLET | ORAL | 0 refills | Status: DC

## 2019-09-06 MED FILL — OXYCODONE-APAP 5-325MG: 5-325 | 45 days supply | Qty: 90 | Fill #0

## 2019-09-07 ENCOUNTER — Other Ambulatory Visit: Payer: Medicare Other

## 2019-09-09 MED FILL — GABAPENTIN 300 MG CAPSULE: 300 | 90 days supply | Qty: 90 | Fill #0

## 2019-09-12 ENCOUNTER — Ambulatory Visit: Payer: Medicare Other

## 2019-09-12 ENCOUNTER — Other Ambulatory Visit: Payer: Self-pay

## 2019-09-12 DIAGNOSIS — I214 Non-ST elevation (NSTEMI) myocardial infarction: Secondary | ICD-10-CM

## 2019-09-12 DIAGNOSIS — Z95820 Peripheral vascular angioplasty status with implants and grafts: Secondary | ICD-10-CM

## 2019-09-12 DIAGNOSIS — I255 Ischemic cardiomyopathy: Secondary | ICD-10-CM | POA: Diagnosis not present

## 2019-09-12 DIAGNOSIS — E782 Mixed hyperlipidemia: Secondary | ICD-10-CM | POA: Diagnosis not present

## 2019-09-13 LAB — LIPID PANEL WITH LDL/HDL RATIO
Cholesterol, Total: 111 mg/dL (ref 100–199)
HDL: 36 mg/dL — ABNORMAL LOW (ref >39)
LDL Chol Calc (NIH): 55 mg/dL (ref 0–99)
LDL/HDL Ratio: 1.5 ratio (ref 0.0–3.6)
Triglycerides: 105 mg/dL (ref 0–149)
VLDL Cholesterol Cal: 20 mg/dL (ref 5–40)

## 2019-09-18 NOTE — Progress Notes (Signed)
Left vm to cb.

## 2019-09-20 ENCOUNTER — Telehealth: Payer: Self-pay

## 2019-09-20 NOTE — Telephone Encounter (Signed)
Left vm to cb.

## 2019-09-20 NOTE — Telephone Encounter (Signed)
-----   Message from Elyn Peers, [place] sent at 09/18/2019 11:57 AM EDT ----- Left vm to cb.

## 2019-10-15 MED FILL — ASPIRIN LOW DOSE 81 MG CHEW: 81 | 90 days supply | Qty: 90 | Fill #0

## 2019-10-15 MED FILL — CLOPIDOGREL 75 MG TABLET: 75 | 90 days supply | Qty: 90 | Fill #0

## 2019-10-22 ENCOUNTER — Other Ambulatory Visit: Payer: Self-pay | Admitting: Urology

## 2019-10-22 ENCOUNTER — Telehealth: Payer: Self-pay | Admitting: *Deleted

## 2019-10-22 MED FILL — ATORVASTATIN 80 MG TABLET: 80 | 90 days supply | Qty: 90 | Fill #0

## 2019-10-22 MED FILL — METOPROLOL TARTRATE 50 MG T: 50 | 90 days supply | Qty: 180 | Fill #0

## 2019-10-22 NOTE — Telephone Encounter (Signed)
Returned patient's phone call, spoke with patient 

## 2019-10-23 ENCOUNTER — Other Ambulatory Visit: Payer: Self-pay | Admitting: Urology

## 2019-10-23 MED ORDER — OXYCODONE-ACETAMINOPHEN 5-325 MG PO TABS: ORAL_TABLET | ORAL | 0 refills | Status: DC

## 2019-10-23 MED FILL — OXYCODONE-APAP 5-325MG: 5-325 | 45 days supply | Qty: 90 | Fill #0

## 2019-10-24 MED FILL — SYNJARDY 12.5-1000 MG TABS: 12.5-1000 | 90 days supply | Qty: 180 | Fill #1

## 2019-10-26 ENCOUNTER — Ambulatory Visit
Admission: RE | Admit: 2019-10-26 | Discharge: 2019-10-26 | Disposition: A | Discharge status: Home / Self Care | Payer: Medicare Other | Source: Ambulatory Visit | Attending: Internal Medicine | Admitting: Internal Medicine

## 2019-10-26 ENCOUNTER — Other Ambulatory Visit: Payer: Self-pay

## 2019-10-26 DIAGNOSIS — C711 Malignant neoplasm of frontal lobe: Secondary | ICD-10-CM

## 2019-10-26 MED ORDER — GADOBENATE DIMEGLUMINE 529 MG/ML IV SOLN
14.0000 mL | Freq: Once | INTRAVENOUS | Status: AC | PRN
  Administered 2019-10-26: 14 mL via INTRAVENOUS

## 2019-10-29 ENCOUNTER — Inpatient Hospital Stay: Payer: Medicare Other | Attending: Internal Medicine

## 2019-10-29 DIAGNOSIS — Z808 Family history of malignant neoplasm of other organs or systems: Secondary | ICD-10-CM | POA: Insufficient documentation

## 2019-10-29 DIAGNOSIS — Z803 Family history of malignant neoplasm of breast: Secondary | ICD-10-CM | POA: Insufficient documentation

## 2019-10-29 DIAGNOSIS — G9389 Other specified disorders of brain: Secondary | ICD-10-CM | POA: Insufficient documentation

## 2019-10-29 DIAGNOSIS — E785 Hyperlipidemia, unspecified: Secondary | ICD-10-CM | POA: Insufficient documentation

## 2019-10-29 DIAGNOSIS — I252 Old myocardial infarction: Secondary | ICD-10-CM | POA: Insufficient documentation

## 2019-10-29 DIAGNOSIS — Z8249 Family history of ischemic heart disease and other diseases of the circulatory system: Secondary | ICD-10-CM | POA: Insufficient documentation

## 2019-10-29 DIAGNOSIS — Z833 Family history of diabetes mellitus: Secondary | ICD-10-CM | POA: Insufficient documentation

## 2019-10-29 DIAGNOSIS — E119 Type 2 diabetes mellitus without complications: Secondary | ICD-10-CM | POA: Insufficient documentation

## 2019-10-29 DIAGNOSIS — Z923 Personal history of irradiation: Secondary | ICD-10-CM | POA: Insufficient documentation

## 2019-10-29 DIAGNOSIS — I2510 Atherosclerotic heart disease of native coronary artery without angina pectoris: Secondary | ICD-10-CM | POA: Insufficient documentation

## 2019-10-29 DIAGNOSIS — F17210 Nicotine dependence, cigarettes, uncomplicated: Secondary | ICD-10-CM | POA: Insufficient documentation

## 2019-10-29 DIAGNOSIS — C711 Malignant neoplasm of frontal lobe: Secondary | ICD-10-CM | POA: Insufficient documentation

## 2019-10-29 DIAGNOSIS — Z79899 Other long term (current) drug therapy: Secondary | ICD-10-CM | POA: Insufficient documentation

## 2019-10-30 ENCOUNTER — Other Ambulatory Visit: Payer: Self-pay | Admitting: Radiation Therapy

## 2019-10-30 ENCOUNTER — Other Ambulatory Visit: Payer: Self-pay

## 2019-10-30 ENCOUNTER — Inpatient Hospital Stay (HOSPITAL_BASED_OUTPATIENT_CLINIC_OR_DEPARTMENT_OTHER): Payer: Medicare Other | Admitting: Internal Medicine

## 2019-10-30 VITALS — BP 118/82 | HR 74 | Temp 98.4°F | Resp 18 | Ht 72.0 in | Wt 167.1 lb

## 2019-10-30 DIAGNOSIS — Z8249 Family history of ischemic heart disease and other diseases of the circulatory system: Secondary | ICD-10-CM | POA: Diagnosis not present

## 2019-10-30 DIAGNOSIS — Z923 Personal history of irradiation: Secondary | ICD-10-CM | POA: Diagnosis not present

## 2019-10-30 DIAGNOSIS — G9389 Other specified disorders of brain: Secondary | ICD-10-CM | POA: Diagnosis not present

## 2019-10-30 DIAGNOSIS — Z833 Family history of diabetes mellitus: Secondary | ICD-10-CM | POA: Diagnosis not present

## 2019-10-30 DIAGNOSIS — Z808 Family history of malignant neoplasm of other organs or systems: Secondary | ICD-10-CM | POA: Diagnosis not present

## 2019-10-30 DIAGNOSIS — E785 Hyperlipidemia, unspecified: Secondary | ICD-10-CM | POA: Diagnosis not present

## 2019-10-30 DIAGNOSIS — I2510 Atherosclerotic heart disease of native coronary artery without angina pectoris: Secondary | ICD-10-CM | POA: Diagnosis not present

## 2019-10-30 DIAGNOSIS — Z803 Family history of malignant neoplasm of breast: Secondary | ICD-10-CM | POA: Diagnosis not present

## 2019-10-30 DIAGNOSIS — E119 Type 2 diabetes mellitus without complications: Secondary | ICD-10-CM | POA: Diagnosis not present

## 2019-10-30 DIAGNOSIS — I252 Old myocardial infarction: Secondary | ICD-10-CM | POA: Diagnosis not present

## 2019-10-30 DIAGNOSIS — F17210 Nicotine dependence, cigarettes, uncomplicated: Secondary | ICD-10-CM | POA: Diagnosis not present

## 2019-10-30 DIAGNOSIS — C711 Malignant neoplasm of frontal lobe: Secondary | ICD-10-CM

## 2019-10-30 DIAGNOSIS — Z79899 Other long term (current) drug therapy: Secondary | ICD-10-CM | POA: Diagnosis not present

## 2019-10-30 NOTE — Progress Notes (Signed)
[facility] [place], [place] [address] [phone]   Interval Evaluation  Date of Service: 10/30/19 Patient Name: Jonathan Barker Patient MRN: [id number] Patient DOB: Nov 06, 1965 Provider: Ventura Sellers, MD  Identifying Statement:  Jonathan Barker is a 54 y.o. 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 Christella Noa, pathology demonstrates WHO grade II diffuse astrocytoma   06/18/2013 - 09/03/2013 Radiation Therapy   54 Gy IMRT in 30 fx, concurrent Temodar, with Dr. Tammi Klippel     Biomarkers:  MGMT Unknown.  IDH 1/2 Unknown.  EGFR Unknown  TERT Unknown   Interval History:  Jonathan Barker presents today for follow up after MRI brain.  Jonathan Barker describes no new or progressive neurologic deficits.  No seizures or headaches.  No further chest pain symptoms or shortness of breath.  Continues to follow with cardiology for post-MI care.  Jonathan Barker still feels not quite back to baseline energy level, continues golfing 3x per week.  H+P (03/31/17) Patient presented initially in early 2015 with a focal seizure, described as "left side clenching up", "change in awareness without loss in memory".  This event led to an MRI brain which demonstrated a likely mass centered in the right frontal lobe.  This underwent biopsy on 05/24/13, pathology revealed grade II glioma.  No genetic markers were sent at that time.  Following surgery Jonathan Barker underwent radiation therapy as above, with concurrent Temodar.  Although Jonathan Barker did consult with a neuro-oncologist in [place], no further therapy was administered, and Jonathan Barker has been on observation for the past 3 years.  Jonathan Barker return today with an MRI  for evaluation.  Jonathan Barker denies any recent seizures, does complain of 3-4 headaches per week as prior.    Medications: Current Outpatient Medications on File Prior to Visit  Medication Sig Dispense Refill   aspirin 81 MG chewable tablet Chew 1 tablet (81 mg total) by mouth daily. 90 tablet 3   atorvastatin (LIPITOR) 80 MG tablet Take 1 tablet (80 mg total) by mouth daily at 6 PM. 90 tablet 3   blood glucose meter kit and supplies KIT Dispense based on patient and insurance preference. Use up to four times daily as directed. (FOR ICD-9 250.00, 250.01). 1 each 0   Blood Pressure Monitoring (BLOOD PRESSURE CUFF) MISC 1 each by Does not apply route daily as needed. 1 each 0   clopidogrel (PLAVIX) 75 MG tablet Take 1 tablet (75 mg total) by mouth daily. 90 tablet 3   Empagliflozin-metFORMIN HCl 12.08-998 MG TABS Take 1 tablet by mouth 2 (two) times daily. 180 tablet 1   gabapentin (NEURONTIN) 300 MG capsule Take 1 capsule (300 mg total) by mouth at bedtime. 90 capsule 3   Insulin Glargine (BASAGLAR KWIKPEN) 100 UNIT/ML Inject 0.12 mLs (12 Units total) into the skin daily. 15 mL 0   Insulin Pen Needle (PEN NEEDLES) 32G X 4 MM MISC 1 pen by Does not apply route as directed. 100 each 11   lisinopril (ZESTRIL) 5 MG tablet Take 1 tablet (5 mg total) by mouth daily. (Patient taking differently: Take 2.5 mg by mouth daily. ) 90 tablet 3   magnesium oxide (MAG-OX) 400 (241.3 Mg) MG  tablet Take 0.5 tablets (200 mg total) by mouth 2 (two) times daily. 90 tablet 1   metoprolol succinate (TOPROL-XL) 100 MG 24 hr tablet Take 1 tablet (100 mg total) by mouth in the morning. Take with or immediately following a meal. Hold if systolic blood pressure (top blood pressure number) less than 100 mmHg or heart rate less than 60 bpm (pulse). 90 tablet 0   nitroGLYCERIN (NITROSTAT) 0.4 MG SL tablet Place 1 tablet (0.4 mg total) under the tongue every 5 (five) minutes x 3 doses as needed for chest pain. 90 tablet 3    oxyCODONE-acetaminophen (PERCOCET/ROXICET) 5-325 MG tablet TAKE 1 TABLET BY MOUTH AT BEDTIME. MAY REPEAT 1 ADDITIONAL TABLET AS NEEDED 90 tablet 0   phenytoin (DILANTIN) 100 MG ER capsule Take 3 capsules every morning 270 capsule 3   No current facility-administered medications on file prior to visit.    Allergies: No Known Allergies Past Medical History:  Past Medical History:  Diagnosis Date   ACS (acute coronary syndrome) ([place] [facility])    Astrocytoma of frontal lobe ([facility]) 05/31/2013   Coronary artery disease    DM (diabetes mellitus) ([facility]) 08/09/2011   A1c at diagnosis = 12.4   Family history of breast cancer    GERD (gastroesophageal reflux disease) 08/09/2011   H/O heart artery stent 07/14/2018   Hyperlipidemia    LAD stenosis    Non-ST elevation (NSTEMI) myocardial infarction ([facility]) 07/15/2019   Nonsustained ventricular tachycardia ([facility])    NSVT (nonsustained ventricular tachycardia) ([facility])    Seizure ([facility]) 05/19/2013   Shortness of breath    Past Surgical History:  Past Surgical History:  Procedure Laterality Date   APPENDECTOMY     BRAIN BIOPSY N/A 05/24/2013   Procedure: Craniotomy for open biopsy;  Surgeon: Winfield Cunas, MD;  Location: [facility];  Service: Neurosurgery;  Laterality: N/A;  Craniotomy for open biopsy   CARDIAC CATHETERIZATION     CORONARY ANGIOPLASTY WITH STENT PLACEMENT     CORONARY/GRAFT ACUTE MI REVASCULARIZATION N/A 07/15/2019   Procedure: Coronary/Graft Acute MI Revascularization;  Surgeon: Adrian Prows, MD;  Location: [facility];  Service: Cardiovascular;  Laterality: N/A;   LEFT HEART CATH AND CORONARY ANGIOGRAPHY N/A 07/15/2019   Procedure: LEFT HEART CATH AND CORONARY ANGIOGRAPHY;  Surgeon: Adrian Prows, MD;  Location: [facility];  Service: Cardiovascular;  Laterality: N/A;   Social History:  Social History   Socioeconomic History   Marital status: Single    Spouse name: Not on file   Number of children: 2   Years of  education: Not on file   Highest education level: Not on file  Occupational History    Employer: [employer]  Tobacco Use   Smoking status: Current Every Day Smoker    Packs/day: 1.00    Years: 33.00    Pack years: 33.00    Types: Cigarettes   Smokeless tobacco: Never Used   Tobacco comment: Trying to quit. Down to 1 ppd, 10/11/13 down to 1/2 ppd  Vaping Use   Vaping Use: Never used  Substance and Sexual Activity   Alcohol use: Not Currently    Alcohol/week: 0.0 standard drinks    Comment: social   Drug use: Yes    Types: Marijuana    Comment: Marijuana daily   Sexual activity: Not on file  Other Topics Concern   Not on file  Social History Narrative   [place] [address] [place]. Jonathan Barker does not work now.  Right handed      Highest level of edu- 11th grade      Lives in one story home. Steps to enter home   Social Determinants of Health   Financial Resource Strain:    Difficulty of Paying Living Expenses:   Food Insecurity:    Worried About Charity fundraiser in the Last Year:    Arboriculturist in the Last Year:   Transportation Needs:    Film/video editor (Medical):    Lack of Transportation (Non-Medical):   Physical Activity:    Days of Exercise per Week:    Minutes of Exercise per Session:   Stress:    Feeling of Stress :   Social Connections:    Frequency of Communication with Friends and Family:    Frequency of Social Gatherings with Friends and Family:    Attends Religious Services:    Active Member of Clubs or Organizations:    Attends Music therapist:    Marital Status:   Intimate Partner Violence:    Fear of Current or Ex-Partner:    Emotionally Abused:    Physically Abused:    Sexually Abused:    Family History:  Family History  Problem Relation Age of Onset   Breast cancer Mother        dx in her 69s-60s   Bone cancer Mother    Diabetes Father    Heart disease Father     Diabetes Brother    Diabetes Paternal Uncle     Review of Systems: Constitutional: Denies fevers, chills or abnormal weight loss Eyes: Denies blurriness of vision Ears, nose, mouth, throat, and face: Denies mucositis or sore throat Respiratory: Denies cough, dyspnea or wheezes Cardiovascular: Denies palpitation, chest discomfort or lower extremity swelling Gastrointestinal:  Denies nausea, constipation, diarrhea GU: Denies dysuria or incontinence Skin: Denies abnormal skin rashes Neurological: Per HPI Musculoskeletal: Denies joint pain, back or neck discomfort. No decrease in ROM Behavioral/Psych: Denies anxiety, disturbance in thought content, and mood instability  Physical Exam: Vitals:   10/30/19 1146  BP: 118/82  Pulse: 74  Resp: 18  Temp: 98.4 F (36.9 C)  SpO2: 100%   KPS: 90. General: Alert, cooperative, pleasant, in no acute distress Head: Normal EENT: No conjunctival injection or scleral icterus. Oral mucosa moist Lungs: Resp effort normal Cardiac: Regular rate and rhythm Abdomen: Soft, non-distended abdomen Skin: No rashes cyanosis or petechiae. Extremities: No clubbing or edema  Neurologic Exam: Mental Status: Awake, alert, attentive to examiner. Oriented to self and environment. Language is fluent with intact comprehension.  Cranial Nerves: Visual acuity is grossly normal. Visual fields are full. Extra-ocular movements intact. No ptosis. Face is symmetric, tongue midline. Motor: Tone and bulk are normal. Power is full in both arms and legs. Reflexes are symmetric, no pathologic reflexes present. Intact finger to nose bilaterally Sensory: Intact to light touch and temperature Gait: Normal and tandem gait is normal.   Labs: I have reviewed the data as listed    Component Value Date/Time   NA 138 07/24/2019 1034   NA 139 01/16/2014 1035   K 4.4 07/24/2019 1034   K 4.1 01/16/2014 1035   CL 100 07/24/2019 1034   CO2 22 07/24/2019 1034   CO2 24 01/16/2014  1035   GLUCOSE 189 (H) 07/24/2019 1034   GLUCOSE 156 (H) 07/16/2019 0228   GLUCOSE 188 (H) 01/16/2014 1035   BUN 12 07/24/2019 1034   BUN 11.6 01/16/2014 1035   CREATININE  0.71 (L) 07/24/2019 1034   CREATININE 1.1 01/16/2014 1035   CALCIUM 9.4 07/24/2019 1034   CALCIUM 9.5 01/16/2014 1035   PROT 7.1 11/16/2013 0921   ALBUMIN 3.9 11/16/2013 0921   AST 23 11/16/2013 0921   ALT 35 11/16/2013 0921   ALKPHOS 83 11/16/2013 0921   BILITOT 0.27 11/16/2013 0921   GFRNONAA 108 07/24/2019 1034   GFRAA 125 07/24/2019 1034   Lab Results  Component Value Date   WBC 21.8 (H) 07/16/2019   NEUTROABS 5.7 11/16/2013   HGB 14.5 07/16/2019   HCT 42.4 07/16/2019   MCV 89.5 07/16/2019   PLT 299 07/16/2019    Imaging: [facility] Interpretation: I have personally reviewed the CNS images as listed.  My interpretation, in the context of the patient's clinical presentation, is treatment effect vs true progression  MR BRAIN W WO CONTRAST  Result Date: 10/27/2019 CLINICAL DATA:  Grade 2 astrocytoma with prior surgery and radiation. EXAM: MRI HEAD WITHOUT AND WITH CONTRAST TECHNIQUE: Multiplanar, multiecho pulse sequences of the brain and surrounding structures were obtained without and with intravenous contrast. CONTRAST:  54m MULTIHANCE GADOBENATE DIMEGLUMINE 529 MG/ML IV SOLN COMPARISON:  MRI head 07/13/2019, 05/18/2019 FINDINGS: Brain: Right frontal craniotomy. Postsurgical encephalomalacia and cystic change in the high right frontal lobe is stable. Surrounding white matter hyperintensity is stable. There is hyperintensity in the corpus callosum extending to the left also unchanged. Two enhancing nodules are present in the right frontal lobe inferior to the surgical site. These measure 3 mm and 4 mm and are stable since 07/13/2019 but have progressed since the MRI of 05/18/2019. No other areas of abnormal enhancement. Negative for acute infarct. Vascular: Normal arterial flow voids. Skull and upper  cervical spine: Right frontal craniotomy Sinuses/Orbits: Paranasal sinuses clear.  Negative orbit. Other: None IMPRESSION: Two small areas of abnormal enhancement the right frontal lobe are stable since 07/13/2019 but have progressed since 05/18/2019. These are concerning for recurrent tumor. Continued close follow-up recommended. Postsurgical resection right frontal convexity with surrounding white matter hyperintensity which is stable. Electronically Signed   By: CFranchot GalloM.D.   On: 10/27/2019 09:37      Assessment/Plan 1. Ast[place] of [place]   [place] Barker neurologically stable at this time.  MRI demonstrates relative stability of two enhancing foci within bed of T2 signal abnormality, after previous MRI demonstrated some growth.  These foci represent radioinflammatory changes or early progressive or transformed tumor.  They are not at this time amenable to biopsy.  We ultimately recommend Jonathan Barker return in 3 months with an additional MRI brain for evaluation.  Will con't to follow with Dr. ADelice Leschfor seizures.   All questions were answered. The patient knows to call the clinic with any problems, questions or concerns. No barriers to learning were detected.  The total time spent in the encounter was 30 minutes and more than 50% was on counseling and review of test results   ZVentura Sellers MD Medical Director of Neuro-Oncology [facility]at [place]06/29/21 11:48 AM

## 2019-10-31 ENCOUNTER — Telehealth: Payer: Self-pay | Admitting: Internal Medicine

## 2019-10-31 NOTE — Telephone Encounter (Signed)
Scheduled per 6/29 los. Pt is aware of appt time and date. Mailing pt appt calendar per pt request

## 2019-11-01 ENCOUNTER — Ambulatory Visit: Payer: Medicare Other | Admitting: Cardiology

## 2019-11-01 ENCOUNTER — Other Ambulatory Visit: Payer: Self-pay

## 2019-11-01 ENCOUNTER — Encounter: Payer: Self-pay | Admitting: Cardiology

## 2019-11-01 VITALS — BP 107/69 | HR 75 | Resp 17 | Ht 72.0 in | Wt 166.0 lb

## 2019-11-01 DIAGNOSIS — I2510 Atherosclerotic heart disease of native coronary artery without angina pectoris: Secondary | ICD-10-CM

## 2019-11-01 DIAGNOSIS — I4729 Other ventricular tachycardia: Secondary | ICD-10-CM

## 2019-11-01 DIAGNOSIS — I214 Non-ST elevation (NSTEMI) myocardial infarction: Secondary | ICD-10-CM

## 2019-11-01 DIAGNOSIS — E782 Mixed hyperlipidemia: Secondary | ICD-10-CM

## 2019-11-01 DIAGNOSIS — Z794 Long term (current) use of insulin: Secondary | ICD-10-CM

## 2019-11-01 DIAGNOSIS — I255 Ischemic cardiomyopathy: Secondary | ICD-10-CM

## 2019-11-01 DIAGNOSIS — Z720 Tobacco use: Secondary | ICD-10-CM

## 2019-11-01 DIAGNOSIS — Z95820 Peripheral vascular angioplasty status with implants and grafts: Secondary | ICD-10-CM

## 2019-11-01 NOTE — Progress Notes (Signed)
Jonathan Barker Date of Birth: [birth year] MRN: [id number] Primary Care Provider:Guilloud, Hoyle Sauer, MD Former Cardiology Providers: Dr. Adrian Prows Primary Cardiologist: Rex Kras, DO (March 2021)  Date: 11/01/19 Last Office Visit: 08/20/2019  Chief Complaint  Patient presents with   Coronary Artery Disease   Hyperlipidemia   Follow-up    2 month   Results    echo   HPI  Jonathan Barker is a 54 y.o. male who presents to the office with a chief complaint of " coronary artery disease management and review test results." Patient's past medical history and cardiac risk factors include: Recent non-STEMI, established coronary artery disease with PCI, hypertension, hyperlipidemia, insulin-dependent diabetes mellitus type 2, nonsustained ventricular tachycardia, active tobacco use, history of astrocytoma.  Patient was admitted to the hospital in March 2021 for chest pain but did not meet STEMI criteria but due to ongoing chest discomfort and elevated cardiac biomarkers was patient was taken to the Cath Lab urgently.  Patient underwent left heart catheterization with successful PTCA and stenting as noted below.  He was started on guideline directed medical therapy and is here for follow-up.  At the last office visit patient was doing well and his medications were titrated but limited to do so due to soft blood pressures.  Since last office visit patient states that he does not have any chest pain at rest or with effort related activities.  He has not required any sublingual nitroglycerin tablets.  He continues to be on dual antiplatelet therapy.  He has not started cardiac rehab yet but is still considering it.  He continues to smoke three fourths of a pack per day despite repeated education on the importance of complete smoking cessation.    Since last office visit patient had an echocardiogram which noted improvement in LVEF.  Results were reviewed with both the patient and his daughter  at today's office visit and noted below.  Medications reconciled.  They continue to take Lopressor twice a day instead of Toprol-XL as prescribed at last office visit.  History of established coronary artery disease with prior PCI, recent non-STEMI, ischemic cardiomyopathy. Denies prior history of congestive heart failure, deep venous thrombosis, pulmonary embolism, stroke, transient ischemic attack.  ALLERGIES: No Known Allergies   MEDICATION LIST PRIOR TO VISIT: Current Outpatient Medications on File Prior to Visit  Medication Sig Dispense Refill   aspirin 81 MG chewable tablet Chew 1 tablet (81 mg total) by mouth daily. 90 tablet 3   atorvastatin (LIPITOR) 80 MG tablet Take 1 tablet (80 mg total) by mouth daily at 6 PM. 90 tablet 3   blood glucose meter kit and supplies KIT Dispense based on patient and insurance preference. Use up to four times daily as directed. (FOR ICD-9 250.00, 250.01). 1 each 0   Blood Pressure Monitoring (BLOOD PRESSURE CUFF) MISC 1 each by Does not apply route daily as needed. 1 each 0   clopidogrel (PLAVIX) 75 MG tablet Take 1 tablet (75 mg total) by mouth daily. 90 tablet 3   Empagliflozin-metFORMIN HCl 12.08-998 MG TABS Take 1 tablet by mouth 2 (two) times daily. 180 tablet 1   gabapentin (NEURONTIN) 300 MG capsule Take 1 capsule (300 mg total) by mouth at bedtime. 90 capsule 3   Insulin Glargine (BASAGLAR KWIKPEN) 100 UNIT/ML Inject 0.12 mLs (12 Units total) into the skin daily. (Patient taking differently: Inject 10 Units into the skin daily. ) 15 mL 0   Insulin Pen Needle (PEN NEEDLES) 32G X 4  MM MISC 1 pen by Does not apply route as directed. 100 each 11   lisinopril (ZESTRIL) 5 MG tablet Take 1 tablet (5 mg total) by mouth daily. (Patient taking differently: Take 2.5 mg by mouth daily. ) 90 tablet 3   magnesium oxide (MAG-OX) 400 (241.3 Mg) MG tablet Take 0.5 tablets (200 mg total) by mouth 2 (two) times daily. 90 tablet 1   metoprolol succinate  (TOPROL-XL) 100 MG 24 hr tablet Take 1 tablet (100 mg total) by mouth in the morning. Take with or immediately following a meal. Hold if systolic blood pressure (top blood pressure number) less than 100 mmHg or heart rate less than 60 bpm (pulse). 90 tablet 0   nitroGLYCERIN (NITROSTAT) 0.4 MG SL tablet Place 1 tablet (0.4 mg total) under the tongue every 5 (five) minutes x 3 doses as needed for chest pain. 90 tablet 3   oxyCODONE-acetaminophen (PERCOCET/ROXICET) 5-325 MG tablet TAKE 1 TABLET BY MOUTH AT BEDTIME. MAY REPEAT 1 ADDITIONAL TABLET AS NEEDED 90 tablet 0   phenytoin (DILANTIN) 100 MG ER capsule Take 3 capsules every morning 270 capsule 3   No current facility-administered medications on file prior to visit.    PAST MEDICAL HISTORY: Past Medical History:  Diagnosis Date   ACS (acute coronary syndrome) ([place])    Astrocytoma of frontal lobe ([place]) 05/31/2013   Coronary artery disease    DM (diabetes mellitus) ([place]) 08/09/2011   A1c at diagnosis = 12.4   Family history of breast cancer    GERD (gastroesophageal reflux disease) 08/09/2011   H/O heart artery stent 07/14/2018   Hyperlipidemia    LAD stenosis    Non-ST elevation (NSTEMI) myocardial infarction ([place]) 07/15/2019   Nonsustained ventricular tachycardia (HCC)    NSVT (nonsustained ventricular tachycardia) ([place])    Seizure ([place]) 05/19/2013   Shortness of breath     PAST SURGICAL HISTORY: Past Surgical History:  Procedure Laterality Date   APPENDECTOMY     BRAIN BIOPSY N/A 05/24/2013   Procedure: Craniotomy for open biopsy;  Surgeon: Winfield Cunas, MD;  Location: [facility];  Service: Neurosurgery;  Laterality: N/A;  Craniotomy for open biopsy   CARDIAC CATHETERIZATION     CORONARY ANGIOPLASTY WITH STENT PLACEMENT     CORONARY/GRAFT ACUTE MI REVASCULARIZATION N/A 07/15/2019   Procedure: Coronary/Graft Acute MI Revascularization;  Surgeon: Adrian Prows, MD;  Location: [facility];  Service:  Cardiovascular;  Laterality: N/A;   LEFT HEART CATH AND CORONARY ANGIOGRAPHY N/A 07/15/2019   Procedure: LEFT HEART CATH AND CORONARY ANGIOGRAPHY;  Surgeon: Adrian Prows, MD;  Location: [facility];  Service: Cardiovascular;  Laterality: N/A;    FAMILY HISTORY: The patient family history includes Bone cancer in his mother; Breast cancer in his mother; Diabetes in his brother, father, and paternal uncle; Heart disease in his father.   SOCIAL HISTORY:  The patient  reports that he has been smoking cigarettes. He has a 33.00 pack-year smoking history. He has never used smokeless tobacco. He reports previous alcohol use. He reports current drug use. Drug: Marijuana.  Review of Systems  Constitutional: Negative for chills and fever.  HENT: Negative for ear discharge, ear pain and nosebleeds.   Eyes: Negative for blurred vision and discharge.  Cardiovascular: Negative for chest pain, claudication, dyspnea on exertion, leg swelling, near-syncope, orthopnea, palpitations, paroxysmal nocturnal dyspnea and syncope.  Respiratory: Negative for cough and shortness of breath.   Endocrine: Negative for polydipsia, polyphagia and polyuria.  Hematologic/Lymphatic: Negative for  bleeding problem.  Skin: Negative for flushing and nail changes.  Musculoskeletal: Negative for muscle cramps, muscle weakness and myalgias.  Gastrointestinal: Negative for abdominal pain, dysphagia, hematemesis, hematochezia, melena, nausea and vomiting.  Neurological: Negative for dizziness, focal weakness and light-headedness.   PHYSICAL EXAM: Vitals with BMI 11/01/2019 10/30/2019 08/20/2019  Height 6' 0"  6' 0"  6' 0"   Weight 166 lbs 167 lbs 2 oz 163 lbs  BMI 22.51 22.97 98.9  Systolic 211 941 740  Diastolic 69 82 814  Pulse 75 74 90   Orthostatic VS for the past 72 hrs (Last 3 readings):  Patient Position BP Location Cuff Size  11/01/19 0852 Sitting Left Arm Normal   Constitutional: He isoriented to person, place, and  time. He appearswell-developedand well-nourished.No distress.  HENT:  Head:Normocephalicand atraumatic.  Eyes: Right eye exhibitsno discharge. Left eye exhibitsno discharge.No scleral icterus.  Neck:No JVDpresent. No tracheal deviationpresent.  Cardiovascular:Normal rateand regular rhythm. Exam revealsno friction rub. No murmurheard. Pulmonary/Chest:Breath sounds normal. Norespiratory distress. He hasno wheezes. He hasno rales.  Abdominal:Soft.Bowel sounds are normal. He exhibitsno distension. There isno abdominal tenderness. There isno reboundand no guarding.  Musculoskeletal:  General: No edema.Normal range of motion.  Cervical back: Normal range of motionand neck supple.  Neurological: He is alertand oriented to person, place, and time. Nocranial nerve deficit.  Skin: Skin isdry.No rashnoted. He is not diaphoretic. Noerythema.  Psychiatric: He has anormal mood and affect. Hisbehavior is normal.Judgmentand thought contentnormal.   Cardiac Studies: EKG  07/14/2019: Normal sinus rhythm, no evidence of ischemia. Compared to prior EKG done a few minutes ago, minimal ST elevation in V1 through V3 which does not meet STEMI criteria appears to have improved.  07/23/2019: Sinus  Rhythm ventricular rate of 86 bpm, right axis deviation, old anterior infarct (age undetermined), T wave inversions in the lateral leads V4-V6, lead I and aVL suggestive of anterolateral ischemia.  Compared to prior EKG dated 07/15/2019 patient continues to be in normal sinus rhythm with T wave inversions in leads V4-V6 are new.  Echocardiogram: 07/15/2019: LVEF 30-35%, moderate to severely decreased function, with regional wall motion abnormalities, grade 1 diastolic impairment, no mural thrombus, no pulmonary hypertension.  09/12/2019: LVEF 40-45%, mild concentric LVH, mild to distal anteroseptal and apical wall hypokinesis, mild MR.  Left Heart Catheterization  07/15/19: LV: Mid to distal anterior, anterolateral and apical akinesis, EF 35 to at most 40%. Hand contrast injection, not well visualized. Normal LVEDP. RCA: Dominant, mild diffuse disease. Left main: Mildly calcified. LAD: Diffusely diseased proximal segment, mid segment had a 90% stenosis. Mid to distal segment has minimal disease and apical LAD had 99% stenosis. Successful PTCA and stenting of the proximal/ostial and mid LAD with implantation of a long 3.0 x 38 mm resolute Onyx DES postdilated with 3.5 x 12 mm noncompliant sapphire balloon. 90% stenosis reduced to 0% with TIMI-3 to TIMI-3 flow. Apical LAD stenosis left alone. Ramus intermediate: Large vessel, ostium has ulcerated appearing 99% stenosis. Upon angioplasty realized it was also calcified and probably chronic. Successful PTCA and stenting with 2 overlapping 2.5 x 15 and distally 2.5 x 13 mm resolute Onyx DES. 9 9% stenosis reduced to 0% with TIMI-3 to TIMI-3 flow. Circumflex: Moderate to large vessel, proximal to mid segment is diffusely diseased 30%. Large OM1 with proximal long 60% to at most 70% stenosis.  48 hour Holter monitor: Dominant rhythm normal sinus, followed by tachycardia (27% burden). Heart rate 84-125 bpm.  Average heart rate 97 bpm. No atrial fibrillation and no sinus  pause greater than or equal to 2.5 seconds in duration. No episodes of nonsustained ventricular tachycardia. 7 isolated ventricular ectopic beats, 0 ventricular pairs and 0 ventricular runs, total ventricular ectopic burden <0.01%. 11 isolated supraventricular ectopic beats, 0 supraventricular runs, total supraventricular ectopic burden 0.01%. No patient diary submitted as a part of the study.  LABORATORY DATA: CBC Latest Ref Rng & Units 07/16/2019 07/15/2019 07/14/2019  WBC 4.0 - 10.5 K/uL 21.8(H) 20.2(H) 17.0(H)  Hemoglobin 13.0 - 17.0 g/dL 14.5 17.1(H) 18.1(H)  Hematocrit 39 - 52 % 42.4 49.7 54.0(H)  Platelets 150 - 400 K/uL 299 330 345     CMP Latest Ref Rng & Units 07/24/2019 07/16/2019 07/15/2019  Glucose 65 - 99 mg/dL 189(H) 156(H) 184(H)  BUN 6 - 24 mg/dL 12 36(H) 10  Creatinine 0.76 - 1.27 mg/dL 0.71(L) 0.67 0.75  Sodium 134 - 144 mmol/L 138 141 139  Potassium 3.5 - 5.2 mmol/L 4.4 3.8 3.7  Chloride 96 - 106 mmol/L 100 112(H) 103  CO2 20 - 29 mmol/L 22 18(L) 24  Calcium 8.7 - 10.2 mg/dL 9.4 9.1 9.1  Total Protein 6.4 - 8.3 g/dL - - -  Total Bilirubin 0.20 - 1.20 mg/dL - - -  Alkaline Phos 40 - 150 U/L - - -  AST 5 - 34 U/L - - -  ALT 0 - 55 U/L - - -    Lipid Panel     Component Value Date/Time   CHOL 111 09/12/2019 0804   TRIG 105 09/12/2019 0804   HDL 36 (L) 09/12/2019 0804   CHOLHDL 5.7 07/14/2019 2336   VLDL 25 07/14/2019 2336   LDLCALC 55 09/12/2019 0804   LABVLDL 20 09/12/2019 0804    Lab Results  Component Value Date   HGBA1C 10.6 (H) 07/14/2019   HGBA1C 10.7 (A) 03/27/2018   HGBA1C 9.4 06/27/2017   No components found for: NTPROBNP Lab Results  Component Value Date   TSH 1.073 07/18/2011    FINAL MEDICATION LIST END OF ENCOUNTER: No orders of the defined types were placed in this encounter.   There are no discontinued medications.   Current Outpatient Medications:    aspirin 81 MG chewable tablet, Chew 1 tablet (81 mg total) by mouth daily., Disp: 90 tablet, Rfl: 3   atorvastatin (LIPITOR) 80 MG tablet, Take 1 tablet (80 mg total) by mouth daily at 6 PM., Disp: 90 tablet, Rfl: 3   blood glucose meter kit and supplies KIT, Dispense based on patient and insurance preference. Use up to four times daily as directed. (FOR ICD-9 250.00, 250.01)., Disp: 1 each, Rfl: 0   Blood Pressure Monitoring (BLOOD PRESSURE CUFF) MISC, 1 each by Does not apply route daily as needed., Disp: 1 each, Rfl: 0   clopidogrel (PLAVIX) 75 MG tablet, Take 1 tablet (75 mg total) by mouth daily., Disp: 90 tablet, Rfl: 3   Empagliflozin-metFORMIN HCl 12.08-998 MG TABS, Take 1 tablet by mouth 2 (two) times daily.,  Disp: 180 tablet, Rfl: 1   gabapentin (NEURONTIN) 300 MG capsule, Take 1 capsule (300 mg total) by mouth at bedtime., Disp: 90 capsule, Rfl: 3   Insulin Glargine (BASAGLAR KWIKPEN) 100 UNIT/ML, Inject 0.12 mLs (12 Units total) into the skin daily. (Patient taking differently: Inject 10 Units into the skin daily. ), Disp: 15 mL, Rfl: 0   Insulin Pen Needle (PEN NEEDLES) 32G X 4 MM MISC, 1 pen by Does not apply route as directed., Disp: 100 each, Rfl: 11   lisinopril (ZESTRIL)  5 MG tablet, Take 1 tablet (5 mg total) by mouth daily. (Patient taking differently: Take 2.5 mg by mouth daily. ), Disp: 90 tablet, Rfl: 3   magnesium oxide (MAG-OX) 400 (241.3 Mg) MG tablet, Take 0.5 tablets (200 mg total) by mouth 2 (two) times daily., Disp: 90 tablet, Rfl: 1   metoprolol succinate (TOPROL-XL) 100 MG 24 hr tablet, Take 1 tablet (100 mg total) by mouth in the morning. Take with or immediately following a meal. Hold if systolic blood pressure (top blood pressure number) less than 100 mmHg or heart rate less than 60 bpm (pulse)., Disp: 90 tablet, Rfl: 0   nitroGLYCERIN (NITROSTAT) 0.4 MG SL tablet, Place 1 tablet (0.4 mg total) under the tongue every 5 (five) minutes x 3 doses as needed for chest pain., Disp: 90 tablet, Rfl: 3   oxyCODONE-acetaminophen (PERCOCET/ROXICET) 5-325 MG tablet, TAKE 1 TABLET BY MOUTH AT BEDTIME. MAY REPEAT 1 ADDITIONAL TABLET AS NEEDED, Disp: 90 tablet, Rfl: 0   phenytoin (DILANTIN) 100 MG ER capsule, Take 3 capsules every morning, Disp: 270 capsule, Rfl: 3  IMPRESSION:    ICD-10-CM   1. Coronary artery disease involving native coronary artery of native heart without angina pectoris  I25.10   2. S/P angioplasty with stent  Z95.820   3. Hx of Non-ST elevation (NSTEMI) myocardial infarction ([place])  I21.4   4. Ischemic cardiomyopathy  I25.5   5. Nonsustained ventricular tachycardia (HCC)  I47.2   6. Mixed hyperlipidemia  E78.2   7. Type 2 diabetes mellitus with other  circulatory complication, with long-term current use of insulin (HCC)  E11.59    Z79.4   8. Tobacco use  Z72.0      RECOMMENDATIONS: CODY ALBUS is a 54 y.o. male whose past medical history and cardiac risk factors include: History of astrocytoma, non-STEMI, nonsustained ventricular tachycardia, established coronary artery disease a status post angioplasty and stenting, ischemic cardiomyopathy, uncontrolled diabetes mellitus type 2, hyperlipidemia, tobacco use.  Established coronary artery disease status post stenting:  Continue dual antiplatelet therapy.  Denies any recent anginal discomfort.  No use of sublingual nitroglycerin tablets.  Patient is still is considering starting cardiac rehab.  Transition to Toprol-XL 100 mg p.o. q. a.m.  Most recent Holter monitor notes sinus rhythm with tachycardic burden 27%.  If his vital signs remained stable will consider uptitrating beta blocker therapy.  Continue ACE inhibitors.  Continue with guideline directed medical therapy.  Currently not uptitrating medical therapy as his systolic blood pressures are less than 100 mmHg but remains asymptomatic.  Echocardiogram results reviewed with the patient and his daughter at today's office visit and noted above for further reference.  Ischemic cardiomyopathy: Improving.  Management as discussed above.  Ejection Fraction noted on last 2D Echo, around October 16, 2019  Recommend daily weight check, strict I/O's  Fluid restriction to <2L per day, Na restriction < 2g per day  Nonsustained ventricular tachycardia:  48-hour Holter monitor is not show evidence of nonsustained ventricular tachycardia.  Transition from Lopressor to Toprol-XL.   Active tobacco use: Continues to smoke 3/4 ppd.  Educated on the importance of complete smoking cessation.    Insulin-dependent diabetes mellitus type 2:  Patient was noted to have poorly controlled diabetes mellitus type 2.  He was started on  insulin prior to discharge.  Currently on Metformin/SGLT 2 inhibitors.  Patient has appointment with PCP later this week.   Mixed hyperlipidemia:  Currently on high intensity statin therapy after his non-STEMI.  Fasting lipid  profile reviewed with the patient.   Total Time spent: 35 min.   --Continue cardiac medications as reconciled in final medication list. --Return in about 3 months (around 02/01/2020) for heart failure and CAD management.. Or sooner if needed. --Continue follow-up with your primary care physician regarding the management of your other chronic comorbid conditions.  Patient's questions and concerns were addressed to his satisfaction. He voices understanding of the instructions provided during this encounter.   This note was created using a voice recognition software as a result there may be grammatical errors inadvertently enclosed that do not reflect the nature of this encounter. Every attempt is made to correct such errors.  Rex Kras, DO, [facility]. [place] Office: [phone]

## 2019-11-12 MED FILL — MAGNESIUM OXIDE 400 MG TAB: 400 (240 MG | 90 days supply | Qty: 90 | Fill #0

## 2019-11-12 MED FILL — SYNJARDY 12.5-1000 MG TABS: 12.5-1000 | 90 days supply | Qty: 180 | Fill #1

## 2019-11-19 MED FILL — PHENYTOIN SODIUM EXTENDED 1: 100 | 90 days supply | Qty: 270 | Fill #0

## 2019-12-03 ENCOUNTER — Telehealth: Payer: Self-pay

## 2019-12-03 NOTE — Telephone Encounter (Signed)
Returning patient call in regards to prescription request. Advised patient call back so request can be addressed. TM

## 2019-12-03 NOTE — Telephone Encounter (Signed)
Spoke with patient advised that prescription request for pain medication will be taken care of. Ashlyn Bruning PA is not in the office today. A secure chat message was sent to Ashlyn by Gar Ponto. RN to refill patient pain medication. TM

## 2019-12-04 ENCOUNTER — Other Ambulatory Visit: Payer: Self-pay | Admitting: Urology

## 2019-12-04 ENCOUNTER — Telehealth: Payer: Self-pay | Admitting: Radiation Oncology

## 2019-12-04 MED ORDER — OXYCODONE-ACETAMINOPHEN 5-325 MG PO TABS: ORAL_TABLET | ORAL | 0 refills | Status: DC

## 2019-12-04 MED FILL — OXYCODONE-APAP 5-325MG: 5-325 | 45 days supply | Qty: 90 | Fill #0

## 2019-12-04 NOTE — Telephone Encounter (Signed)
-----   Message from [name], [place] sent at 12/04/2019  9:09 AM EDT ----- Regarding: RE: It's that time again Please let him know that I sent the Rx to [facility] outpatient pharmacy. Thank you! -Ash ----- Message ----- From: Heywood Footman, RN Sent: 12/03/2019   3:41 PM EDT To: [name], PA-C, Loma Messing, LPN Subject: It's that time again                           Ash.  Patient phoned today requesting a refill of his percocet. We told him you are off on Mondays but we would let you know.   [name]

## 2019-12-04 NOTE — Telephone Encounter (Signed)
Phoned patient as requested by [name], PA-C. Informed patient the Rx he requested has been escribed to [facility]. Patient verbalized understanding and appreciation for the call.

## 2019-12-18 MED FILL — GABAPENTIN 300 MG CAPSULE: 300 | 90 days supply | Qty: 90 | Fill #1

## 2020-01-11 ENCOUNTER — Telehealth: Payer: Self-pay | Admitting: *Deleted

## 2020-01-11 NOTE — Telephone Encounter (Signed)
RETURNED PATIENT'S DAUGHTER'S  PHONE CALL, SPOKE WITH PATIENT'S DAUGHTER 

## 2020-01-16 ENCOUNTER — Telehealth: Payer: Self-pay | Admitting: Radiation Oncology

## 2020-01-16 ENCOUNTER — Other Ambulatory Visit: Payer: Self-pay | Admitting: Urology

## 2020-01-16 MED ORDER — OXYCODONE-ACETAMINOPHEN 5-325 MG PO TABS: ORAL_TABLET | ORAL | 0 refills | Status: DC

## 2020-01-16 MED FILL — OXYCODONE-APAP 5-325MG: 5-325 | 45 days supply | Qty: 90 | Fill #0

## 2020-01-16 NOTE — Telephone Encounter (Signed)
Phoned patient as requested by [name], PA-C. No answer. Left voicemail message explaining medication refill request has been sent and ready for pick up at [facility]. Provide my direct number for future questions or needs.

## 2020-01-16 NOTE — Telephone Encounter (Signed)
-----   Message from [name], [place] sent at 01/16/2020 11:40 AM EDT ----- Regarding: RE: Rx refill Please let Mr. Gaetano know that I sent the refill to [facility] pharmacy. Lia Foyer ----- Message ----- From: Heywood Footman, RN Sent: 01/16/2020  10:20 AM EDT To: [name], PA-C, Starr Sinclair Subject: RE: Rx refill                                  Thank you, Shay. I will forward the refill request onto Ashlyn.   [name] ----- Message ----- From: Starr Sinclair Sent: 01/16/2020  10:15 AM EDT To: Heywood Footman, RN Subject: Rx refill                                      Good morning,  Maudie Flakes called to request a refill of his percocet. He stated that he will run out tomorrow.  Thank you,  Isaias Sakai

## 2020-01-25 MED FILL — CLOPIDOGREL 75 MG TABLET: 75 | 90 days supply | Qty: 90 | Fill #1

## 2020-01-31 MED FILL — ATORVASTATIN 80 MG TABLET: 80 | 90 days supply | Qty: 90 | Fill #1

## 2020-01-31 MED FILL — LISINOPRIL 5 MG TABLET: 5 | 90 days supply | Qty: 90 | Fill #0

## 2020-02-01 ENCOUNTER — Ambulatory Visit: Payer: Medicare Other | Admitting: Cardiology

## 2020-02-02 ENCOUNTER — Other Ambulatory Visit: Payer: Self-pay

## 2020-02-02 ENCOUNTER — Ambulatory Visit
Admission: RE | Admit: 2020-02-02 | Discharge: 2020-02-02 | Disposition: A | Discharge status: Home / Self Care | Payer: Medicare Other | Source: Ambulatory Visit | Attending: Internal Medicine | Admitting: Internal Medicine

## 2020-02-02 DIAGNOSIS — G9389 Other specified disorders of brain: Secondary | ICD-10-CM | POA: Diagnosis not present

## 2020-02-02 DIAGNOSIS — C711 Malignant neoplasm of frontal lobe: Secondary | ICD-10-CM

## 2020-02-02 DIAGNOSIS — H748X2 Other specified disorders of left middle ear and mastoid: Secondary | ICD-10-CM | POA: Diagnosis not present

## 2020-02-02 DIAGNOSIS — J3489 Other specified disorders of nose and nasal sinuses: Secondary | ICD-10-CM | POA: Diagnosis not present

## 2020-02-02 DIAGNOSIS — G319 Degenerative disease of nervous system, unspecified: Secondary | ICD-10-CM | POA: Diagnosis not present

## 2020-02-02 MED ORDER — GADOBENATE DIMEGLUMINE 529 MG/ML IV SOLN
15.0000 mL | Freq: Once | INTRAVENOUS | Status: AC | PRN
  Administered 2020-02-02: 15 mL via INTRAVENOUS

## 2020-02-04 ENCOUNTER — Inpatient Hospital Stay: Payer: Medicare Other | Attending: Internal Medicine

## 2020-02-04 DIAGNOSIS — Z9049 Acquired absence of other specified parts of digestive tract: Secondary | ICD-10-CM | POA: Insufficient documentation

## 2020-02-04 DIAGNOSIS — Z803 Family history of malignant neoplasm of breast: Secondary | ICD-10-CM | POA: Insufficient documentation

## 2020-02-04 DIAGNOSIS — E119 Type 2 diabetes mellitus without complications: Secondary | ICD-10-CM | POA: Insufficient documentation

## 2020-02-04 DIAGNOSIS — Z79899 Other long term (current) drug therapy: Secondary | ICD-10-CM | POA: Insufficient documentation

## 2020-02-04 DIAGNOSIS — C711 Malignant neoplasm of frontal lobe: Secondary | ICD-10-CM | POA: Insufficient documentation

## 2020-02-04 DIAGNOSIS — F17210 Nicotine dependence, cigarettes, uncomplicated: Secondary | ICD-10-CM | POA: Insufficient documentation

## 2020-02-04 DIAGNOSIS — I252 Old myocardial infarction: Secondary | ICD-10-CM | POA: Insufficient documentation

## 2020-02-04 DIAGNOSIS — Z833 Family history of diabetes mellitus: Secondary | ICD-10-CM | POA: Insufficient documentation

## 2020-02-04 DIAGNOSIS — Z923 Personal history of irradiation: Secondary | ICD-10-CM | POA: Insufficient documentation

## 2020-02-04 DIAGNOSIS — Z8249 Family history of ischemic heart disease and other diseases of the circulatory system: Secondary | ICD-10-CM | POA: Insufficient documentation

## 2020-02-04 DIAGNOSIS — Z808 Family history of malignant neoplasm of other organs or systems: Secondary | ICD-10-CM | POA: Insufficient documentation

## 2020-02-04 DIAGNOSIS — E785 Hyperlipidemia, unspecified: Secondary | ICD-10-CM | POA: Insufficient documentation

## 2020-02-04 DIAGNOSIS — I2510 Atherosclerotic heart disease of native coronary artery without angina pectoris: Secondary | ICD-10-CM | POA: Insufficient documentation

## 2020-02-04 DIAGNOSIS — G9389 Other specified disorders of brain: Secondary | ICD-10-CM | POA: Insufficient documentation

## 2020-02-05 ENCOUNTER — Other Ambulatory Visit: Payer: Self-pay

## 2020-02-05 ENCOUNTER — Inpatient Hospital Stay (HOSPITAL_BASED_OUTPATIENT_CLINIC_OR_DEPARTMENT_OTHER): Payer: Medicare Other | Admitting: Internal Medicine

## 2020-02-05 VITALS — BP 110/73 | HR 76 | Temp 97.8°F | Resp 18 | Ht 72.0 in | Wt 168.3 lb

## 2020-02-05 DIAGNOSIS — Z808 Family history of malignant neoplasm of other organs or systems: Secondary | ICD-10-CM | POA: Diagnosis not present

## 2020-02-05 DIAGNOSIS — C711 Malignant neoplasm of frontal lobe: Secondary | ICD-10-CM

## 2020-02-05 DIAGNOSIS — I2510 Atherosclerotic heart disease of native coronary artery without angina pectoris: Secondary | ICD-10-CM | POA: Diagnosis not present

## 2020-02-05 DIAGNOSIS — Z9049 Acquired absence of other specified parts of digestive tract: Secondary | ICD-10-CM | POA: Diagnosis not present

## 2020-02-05 DIAGNOSIS — I252 Old myocardial infarction: Secondary | ICD-10-CM | POA: Diagnosis not present

## 2020-02-05 DIAGNOSIS — E119 Type 2 diabetes mellitus without complications: Secondary | ICD-10-CM | POA: Diagnosis not present

## 2020-02-05 DIAGNOSIS — Z833 Family history of diabetes mellitus: Secondary | ICD-10-CM | POA: Diagnosis not present

## 2020-02-05 DIAGNOSIS — Z803 Family history of malignant neoplasm of breast: Secondary | ICD-10-CM | POA: Diagnosis not present

## 2020-02-05 DIAGNOSIS — Z79899 Other long term (current) drug therapy: Secondary | ICD-10-CM | POA: Diagnosis not present

## 2020-02-05 DIAGNOSIS — F17210 Nicotine dependence, cigarettes, uncomplicated: Secondary | ICD-10-CM | POA: Diagnosis not present

## 2020-02-05 DIAGNOSIS — E785 Hyperlipidemia, unspecified: Secondary | ICD-10-CM | POA: Diagnosis not present

## 2020-02-05 DIAGNOSIS — G9389 Other specified disorders of brain: Secondary | ICD-10-CM | POA: Diagnosis not present

## 2020-02-05 DIAGNOSIS — Z8249 Family history of ischemic heart disease and other diseases of the circulatory system: Secondary | ICD-10-CM | POA: Diagnosis not present

## 2020-02-05 DIAGNOSIS — Z923 Personal history of irradiation: Secondary | ICD-10-CM | POA: Diagnosis not present

## 2020-02-05 NOTE — Progress Notes (Signed)
[facility] [place], [place] [address] [phone]   Interval Evaluation  Date of Service: 02/05/20 Patient Name: Jonathan Barker Patient MRN: [id number] Patient DOB: 10-11-1965 Provider: Ventura Sellers, MD  Identifying Statement:  Jonathan Barker is a 54 y.o. 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 Christella Noa, pathology demonstrates WHO grade II diffuse astrocytoma   06/18/2013 - 09/03/2013 Radiation Therapy   54 Gy IMRT in 30 fx, concurrent Temodar, with Dr. Tammi Klippel     Biomarkers:  MGMT Unknown.  IDH 1/2 Unknown.  EGFR Unknown  TERT Unknown   Interval History:  Jonathan Barker presents today for follow up after MRI brain.  He describes no new or progressive neurologic deficits.  No seizures or headaches.  Cardiac symptoms have been kept at [place]. Continues to follow with cardiology for post-MI care. Golfing frequently, remains active.  H+P (03/31/17) Patient presented initially in early 2015 with a focal seizure, described as "left side clenching up", "change in awareness without loss in memory".  This event led to an MRI brain which demonstrated a likely mass centered in the right frontal lobe.  This underwent biopsy on 05/24/13, pathology revealed grade II glioma.  No genetic markers were sent at that time.  Following surgery he underwent radiation therapy as above, with concurrent Temodar.  Although he did consult with a neuro-oncologist in [place], no further therapy was administered, and he has been on observation for the past 3 years.  He return today with an MRI for evaluation.  He denies any recent seizures, does complain of  3-4 headaches per week as prior.    Medications: Current Outpatient Medications on File Prior to Visit  Medication Sig Dispense Refill  . aspirin 81 MG chewable tablet Chew 1 tablet (81 mg total) by mouth daily. 90 tablet 3  . atorvastatin (LIPITOR) 80 MG tablet Take 1 tablet (80 mg total) by mouth daily at 6 PM. 90 tablet 3  . blood glucose meter kit and supplies KIT Dispense based on patient and insurance preference. Use up to four times daily as directed. (FOR ICD-9 250.00, 250.01). 1 each 0  . Blood Pressure Monitoring (BLOOD PRESSURE CUFF) MISC 1 each by Does not apply route daily as needed. 1 each 0  . clopidogrel (PLAVIX) 75 MG tablet Take 1 tablet (75 mg total) by mouth daily. 90 tablet 3  . Empagliflozin-metFORMIN HCl 12.08-998 MG TABS Take 1 tablet by mouth 2 (two) times daily. 180 tablet 1  . gabapentin (NEURONTIN) 300 MG capsule Take 1 capsule (300 mg total) by mouth at bedtime. 90 capsule 3  . Insulin Glargine (BASAGLAR KWIKPEN) 100 UNIT/ML Inject 0.12 mLs (12 Units total) into the skin daily. (Patient taking differently: Inject 10 Units into the skin daily. ) 15 mL 0  . Insulin Pen Needle (PEN NEEDLES) 32G X 4 MM MISC 1 pen by Does not apply route as directed. 100 each 11  . lisinopril (ZESTRIL) 5 MG tablet Take 1 tablet (5 mg total) by mouth daily. (Patient taking differently: Take 2.5 mg by mouth daily. ) 90 tablet 3  . magnesium oxide (MAG-OX) 400 (241.3 Mg) MG tablet Take 0.5 tablets (  200 mg total) by mouth 2 (two) times daily. 90 tablet 1  . metoprolol succinate (TOPROL-XL) 100 MG 24 hr tablet Take 1 tablet (100 mg total) by mouth in the morning. Take with or immediately following a meal. Hold if systolic blood pressure (top blood pressure number) less than 100 mmHg or heart rate less than 60 bpm (pulse). 90 tablet 0  . nitroGLYCERIN (NITROSTAT) 0.4 MG SL tablet Place 1 tablet (0.4 mg total) under the tongue every 5 (five) minutes x 3 doses as needed for chest pain. 90 tablet 3   . oxyCODONE-acetaminophen (PERCOCET/ROXICET) 5-325 MG tablet TAKE 1 TABLET BY MOUTH AT BEDTIME. MAY REPEAT 1 ADDITIONAL TABLET AS NEEDED 90 tablet 0  . phenytoin (DILANTIN) 100 MG ER capsule Take 3 capsules every morning 270 capsule 3   No current facility-administered medications on file prior to visit.    Allergies: No Known Allergies Past Medical History:  Past Medical History:  Diagnosis Date  . ACS (acute coronary syndrome) ([place] [facility])   . Astrocytoma of frontal lobe ([place]) 05/31/2013  . Coronary artery disease   . DM (diabetes mellitus) ([facility]) 08/09/2011   A1c at diagnosis = 12.4  . Family history of breast cancer   . GERD (gastroesophageal reflux disease) 08/09/2011  . H/O heart artery stent 07/14/2018  . Hyperlipidemia   . LAD stenosis   . Non-ST elevation (NSTEMI) myocardial infarction ([facility]) 07/15/2019  . Nonsustained ventricular tachycardia ([place])   . NSVT (nonsustained ventricular tachycardia) ([facility])   . Seizure ([facility]) 05/19/2013  . Shortness of breath    Past Surgical History:  Past Surgical History:  Procedure Laterality Date  . APPENDECTOMY    . BRAIN BIOPSY N/A 05/24/2013   Procedure: Craniotomy for open biopsy;  Surgeon: Winfield Cunas, MD;  Location: [facility];  Service: Neurosurgery;  Laterality: N/A;  Craniotomy for open biopsy  . CARDIAC CATHETERIZATION    . CORONARY ANGIOPLASTY WITH STENT PLACEMENT    . CORONARY/GRAFT ACUTE MI REVASCULARIZATION N/A 07/15/2019   Procedure: Coronary/Graft Acute MI Revascularization;  Surgeon: Adrian Prows, MD;  Location: [facility];  Service: Cardiovascular;  Laterality: N/A;  . LEFT HEART CATH AND CORONARY ANGIOGRAPHY N/A 07/15/2019   Procedure: LEFT HEART CATH AND CORONARY ANGIOGRAPHY;  Surgeon: Adrian Prows, MD;  Location: [facility];  Service: Cardiovascular;  Laterality: N/A;   Social History:  Social History   Socioeconomic History  . Marital status: Single    Spouse name: Not on file  . Number of children: 2  .  Years of education: Not on file  . Highest education level: Not on file  Occupational History    Employer: [employer]  Tobacco Use  . Smoking status: Current Every Day Smoker    Packs/day: 1.00    Years: 33.00    Pack years: 33.00    Types: Cigarettes  . Smokeless tobacco: Never Used  . Tobacco comment: Trying to quit. Down to 1 ppd, 10/11/13 down to 1/2 ppd  Vaping Use  . Vaping Use: Never used  Substance and Sexual Activity  . Alcohol use: Not Currently    Alcohol/week: 0.0 standard drinks  . Drug use: Yes    Types: Marijuana    Comment: Marijuana daily  . Sexual activity: Not on file  Other Topics Concern  . Not on file  Social History Narrative   [place] . He does not work now.    Right handed  Highest level of edu- 11th grade      Lives in one story home. Steps to enter home   Social Determinants of Health   Financial Resource Strain:   . Difficulty of Paying Living Expenses: Not on file  Food Insecurity:   . Worried About Charity fundraiser in the Last Year: Not on file  . Ran Out of Food in the Last Year: Not on file  Transportation Needs:   . Lack of Transportation (Medical): Not on file  . Lack of Transportation (Non-Medical): Not on file  Physical Activity:   . Days of Exercise per Week: Not on file  . Minutes of Exercise per Session: Not on file  Stress:   . Feeling of Stress : Not on file  Social Connections:   . Frequency of Communication with Friends and Family: Not on file  . Frequency of Social Gatherings with Friends and Family: Not on file  . Attends Religious Services: Not on file  . Active Member of Clubs or Organizations: Not on file  . Attends Archivist Meetings: Not on file  . Marital Status: Not on file  Intimate Partner Violence:   . Fear of Current or Ex-Partner: Not on file  . Emotionally Abused: Not on file  . Physically Abused: Not on file  . Sexually Abused: Not on file    Family History:  Family History  Problem Relation Age of Onset  . Breast cancer Mother        dx in her 64s-60s  . Bone cancer Mother   . Diabetes Father   . Heart disease Father   . Diabetes Brother   . Diabetes Paternal Uncle     Review of Systems: Constitutional: Denies fevers, chills or abnormal weight loss Eyes: Denies blurriness of vision Ears, nose, mouth, throat, and face: Denies mucositis or sore throat Respiratory: Denies cough, dyspnea or wheezes Cardiovascular: Denies palpitation, chest discomfort or lower extremity swelling Gastrointestinal:  Denies nausea, constipation, diarrhea GU: Denies dysuria or incontinence Skin: Denies abnormal skin rashes Neurological: Per HPI Musculoskeletal: Denies joint pain, back or neck discomfort. No decrease in ROM Behavioral/Psych: Denies anxiety, disturbance in thought content, and mood instability  Physical Exam: Vitals:   02/05/20 0921  BP: 110/73  Pulse: 76  Resp: 18  Temp: 97.8 F (36.6 C)  SpO2: 100%   KPS: 90. General: Alert, cooperative, pleasant, in no acute distress Head: Normal EENT: No conjunctival injection or scleral icterus. Oral mucosa moist Lungs: Resp effort normal Cardiac: Regular rate and rhythm Abdomen: Soft, non-distended abdomen Skin: No rashes cyanosis or petechiae. Extremities: No clubbing or edema  Neurologic Exam: Mental Status: Awake, alert, attentive to examiner. Oriented to self and environment. Language is fluent with intact comprehension.  Cranial Nerves: Visual acuity is grossly normal. Visual fields are full. Extra-ocular movements intact. No ptosis. Face is symmetric, tongue midline. Motor: Tone and bulk are normal. Power is full in both arms and legs. Reflexes are symmetric, no pathologic reflexes present. Intact finger to nose bilaterally Sensory: Intact to light touch and temperature Gait: Normal and tandem gait is normal.   Labs: I have reviewed the data as listed     Component Value Date/Time   NA 138 07/24/2019 1034   NA 139 01/16/2014 1035   K 4.4 07/24/2019 1034   K 4.1 01/16/2014 1035   CL 100 07/24/2019 1034   CO2 22 07/24/2019 1034   CO2 24 01/16/2014 1035   GLUCOSE 189 (H) 07/24/2019 1034  GLUCOSE 156 (H) 07/16/2019 0228   GLUCOSE 188 (H) 01/16/2014 1035   BUN 12 07/24/2019 1034   BUN 11.6 01/16/2014 1035   CREATININE 0.71 (L) 07/24/2019 1034   CREATININE 1.1 01/16/2014 1035   CALCIUM 9.4 07/24/2019 1034   CALCIUM 9.5 01/16/2014 1035   PROT 7.1 11/16/2013 0921   ALBUMIN 3.9 11/16/2013 0921   AST 23 11/16/2013 0921   ALT 35 11/16/2013 0921   ALKPHOS 83 11/16/2013 0921   BILITOT 0.27 11/16/2013 0921   GFRNONAA 108 07/24/2019 1034   GFRAA 125 07/24/2019 1034   Lab Results  Component Value Date   WBC 21.8 (H) 07/16/2019   NEUTROABS 5.7 11/16/2013   HGB 14.5 07/16/2019   HCT 42.4 07/16/2019   MCV 89.5 07/16/2019   PLT 299 07/16/2019    Imaging: [facility] Clinician Interpretation: I have personally reviewed the CNS images as listed.  My interpretation, in the context of the patient's clinical presentation, is stable disease  MR BRAIN W WO CONTRAST  Result Date: 02/02/2020 CLINICAL DATA:  History of right frontal WHO grade 2 glioma status post biopsy and radiation therapy in 2015. EXAM: MRI HEAD WITHOUT AND WITH CONTRAST TECHNIQUE: Multiplanar, multiecho pulse sequences of the brain and surrounding structures were obtained without and with intravenous contrast. CONTRAST:  60m MULTIHANCE GADOBENATE DIMEGLUMINE 529 MG/ML IV SOLN COMPARISON:  10/26/2019 FINDINGS: Brain: Post biopsy changes are again noted in the right frontal lobe with a few foci of cystic encephalomalacia. T2 hyperintensity in the right frontal white matter is unchanged and without mass effect. Less extensive T2 hyperintensity in the left frontal white matter is also unchanged. Two foci of enhancement in the right frontal lobe measuring 3-4 mm are unchanged (series 16,  images 99 and 111). No new areas of abnormal enhancement are identified. 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 Chronic microhemorrhages in the thalami are unchanged. There are unchanged remote insults in the corpus callosum. There is mild cerebral atrophy. No acute infarct, midline shift, or extra-axial fluid collection is identified. Vascular: Major intracranial vascular flow voids are preserved. Skull and upper cervical spine: Right frontal craniotomy. No suspicious marrow lesion. Sinuses/Orbits: Unremarkable orbits. Minimal mucosal thickening in the paranasal sinuses. Trace left mastoid effusion. Other: None. IMPRESSION: Stable post treatment changes. Unchanged 2 small foci of enhancement in the right frontal lobe without evidence of progressive disease. Electronically Signed   By: ALogan BoresM.D.   On: 02/02/2020 14:37      Assessment/Plan 1. [place]   Jonathan Ihais clinically stable today.  MRI again demonstrates stability of two enhancing foci within bed of T2 signal abnormality.  These foci likely represent radioinflammatory changes.  We ask that Jonathan Barker to clinic in 6 months following next brain MRI, or sooner as needed.  Will con't to follow with Dr. ADelice Leschfor seizures.   All questions were answered. The patient knows to call the clinic with any problems, questions or concerns. No barriers to learning were detected.  I have spent a total of 30 minutes of face-to-face and non-face-to-face time, excluding clinical staff time, preparing to see patient, ordering tests and/or medications, counseling the patient, and independently interpreting results and communicating results to the patient/family/caregiver    ZVentura Sellers MD Medical Director of  [facility] 02/05/20 9:22 AM

## 2020-02-06 ENCOUNTER — Telehealth: Payer: Self-pay | Admitting: Internal Medicine

## 2020-02-06 NOTE — Telephone Encounter (Signed)
Scheduled per 10/5 los. Pt is aware of appt time and date.

## 2020-02-11 ENCOUNTER — Ambulatory Visit: Payer: Medicare Other | Admitting: Cardiology

## 2020-02-11 ENCOUNTER — Encounter: Payer: Self-pay | Admitting: Cardiology

## 2020-02-11 ENCOUNTER — Other Ambulatory Visit: Payer: Self-pay

## 2020-02-11 VITALS — BP 106/71 | HR 83 | Resp 16 | Ht 72.0 in | Wt 170.0 lb

## 2020-02-11 DIAGNOSIS — E1159 Type 2 diabetes mellitus with other circulatory complications: Secondary | ICD-10-CM | POA: Diagnosis not present

## 2020-02-11 DIAGNOSIS — I4729 Other ventricular tachycardia: Secondary | ICD-10-CM

## 2020-02-11 DIAGNOSIS — I255 Ischemic cardiomyopathy: Secondary | ICD-10-CM | POA: Diagnosis not present

## 2020-02-11 DIAGNOSIS — I472 Ventricular tachycardia: Secondary | ICD-10-CM | POA: Diagnosis not present

## 2020-02-11 DIAGNOSIS — Z95820 Peripheral vascular angioplasty status with implants and grafts: Secondary | ICD-10-CM

## 2020-02-11 DIAGNOSIS — Z720 Tobacco use: Secondary | ICD-10-CM | POA: Diagnosis not present

## 2020-02-11 DIAGNOSIS — I214 Non-ST elevation (NSTEMI) myocardial infarction: Secondary | ICD-10-CM

## 2020-02-11 DIAGNOSIS — E782 Mixed hyperlipidemia: Secondary | ICD-10-CM

## 2020-02-11 DIAGNOSIS — Z794 Long term (current) use of insulin: Secondary | ICD-10-CM | POA: Diagnosis not present

## 2020-02-11 DIAGNOSIS — I2510 Atherosclerotic heart disease of native coronary artery without angina pectoris: Secondary | ICD-10-CM | POA: Diagnosis not present

## 2020-02-11 DIAGNOSIS — C711 Malignant neoplasm of frontal lobe: Secondary | ICD-10-CM | POA: Diagnosis not present

## 2020-02-11 MED FILL — METOPROLOL TARTRATE 50 MG T: 50 | 90 days supply | Qty: 180 | Fill #1

## 2020-02-11 NOTE — Progress Notes (Signed)
Jonathan Barker Date of Birth: 01-Dec-1965 MRN: [id number] Primary Care Provider:Guilloud, Hoyle Sauer, MD Former Cardiology Providers: Dr. Adrian Prows Primary Cardiologist: Rex Kras, DO (March 2021)  Date: 02/11/20 Last Office Visit: 11/01/2019  Chief Complaint  Patient presents with  . Coronary Artery Disease  . Follow-up    3 month    HPI  Jonathan Barker is a 54 y.o. male who presents to the office with a chief complaint of " coronary artery disease management." Patient's past medical history and cardiac risk factors include: Recent non-STEMI, established coronary artery disease with PCI, ischemic cardiomyopathy, hypertension, hyperlipidemia, insulin-dependent diabetes mellitus type 2, history of nonsustained ventricular tachycardia, active tobacco use, history of astrocytoma.  Patient was admitted to the hospital in March 2021 for chest pain but did not meet STEMI criteria but due to ongoing chest discomfort and elevated cardiac biomarkers was patient was taken to the Cath Lab urgently.  Patient underwent left heart catheterization with successful PTCA and stenting as noted below.  He was started on guideline directed medical therapy and is here for follow-up.  Since last office visit patient states that he is doing well from a cardiovascular standpoint.  He denies any chest pain or shortness of breath at rest or with effort related activities.  He is playing golf and doing yard work on a regular basis without any effort related symptoms.  No use of sublingual nitroglycerin tablets since last visit.  No hospitalizations or urgent care visits.  Patient is compliant with his medical therapy.  Unable to uptitrate guideline directed medical therapy given soft blood pressures.  Unfortunately, patient continues to smoke 1 pack/day.  At the last office visit we decided to transition from Lopressor to Toprol-XL.  However, patient states that he still has not done this and continues to take  Lopressor.  ALLERGIES: No Known Allergies   MEDICATION LIST PRIOR TO VISIT: Current Outpatient Medications on File Prior to Visit  Medication Sig Dispense Refill  . aspirin 81 MG chewable tablet Chew 1 tablet (81 mg total) by mouth daily. 90 tablet 3  . atorvastatin (LIPITOR) 80 MG tablet Take 1 tablet (80 mg total) by mouth daily at 6 PM. 90 tablet 3  . blood glucose meter kit and supplies KIT Dispense based on patient and insurance preference. Use up to four times daily as directed. (FOR ICD-9 250.00, 250.01). 1 each 0  . Blood Pressure Monitoring (BLOOD PRESSURE CUFF) MISC 1 each by Does not apply route daily as needed. 1 each 0  . clopidogrel (PLAVIX) 75 MG tablet Take 1 tablet (75 mg total) by mouth daily. 90 tablet 3  . Empagliflozin-metFORMIN HCl 12.08-998 MG TABS Take 1 tablet by mouth 2 (two) times daily. 180 tablet 1  . gabapentin (NEURONTIN) 300 MG capsule Take 1 capsule (300 mg total) by mouth at bedtime. 90 capsule 3  . Insulin Glargine (BASAGLAR KWIKPEN) 100 UNIT/ML Inject 0.12 mLs (12 Units total) into the skin daily. (Patient taking differently: Inject 10 Units into the skin daily. ) 15 mL 0  . Insulin Pen Needle (PEN NEEDLES) 32G X 4 MM MISC 1 pen by Does not apply route as directed. 100 each 11  . lisinopril (ZESTRIL) 5 MG tablet Take 1 tablet (5 mg total) by mouth daily. (Patient taking differently: Take 2.5 mg by mouth daily. ) 90 tablet 3  . magnesium oxide (MAG-OX) 400 (241.3 Mg) MG tablet Take 0.5 tablets (200 mg total) by mouth 2 (two) times daily. 90 tablet 1  .  metoprolol succinate (TOPROL-XL) 100 MG 24 hr tablet Take 1 tablet (100 mg total) by mouth in the morning. Take with or immediately following a meal. Hold if systolic blood pressure (top blood pressure number) less than 100 mmHg or heart rate less than 60 bpm (pulse). 90 tablet 0  . nitroGLYCERIN (NITROSTAT) 0.4 MG SL tablet Place 1 tablet (0.4 mg total) under the tongue every 5 (five) minutes x 3 doses as  needed for chest pain. 90 tablet 3  . oxyCODONE-acetaminophen (PERCOCET/ROXICET) 5-325 MG tablet TAKE 1 TABLET BY MOUTH AT BEDTIME. MAY REPEAT 1 ADDITIONAL TABLET AS NEEDED 90 tablet 0  . phenytoin (DILANTIN) 100 MG ER capsule Take 3 capsules every morning 270 capsule 3   No current facility-administered medications on file prior to visit.    PAST MEDICAL HISTORY: Past Medical History:  Diagnosis Date  . ACS (acute coronary syndrome) ([place])   . Astrocytoma of frontal lobe ([place]) 05/31/2013  . Coronary artery disease   . DM (diabetes mellitus) ([place]) 08/09/2011   A1c at diagnosis = 12.4  . Family history of breast cancer   . GERD (gastroesophageal reflux disease) 08/09/2011  . H/O heart artery stent 07/14/2018  . Hyperlipidemia   . LAD stenosis   . Non-ST elevation (NSTEMI) myocardial infarction ([place]) 07/15/2019  . Nonsustained ventricular tachycardia ([place])   . NSVT (nonsustained ventricular tachycardia) ([place])   . Seizure ([place]) 05/19/2013  . Shortness of breath     PAST SURGICAL HISTORY: Past Surgical History:  Procedure Laterality Date  . APPENDECTOMY    . BRAIN BIOPSY N/A 05/24/2013   Procedure: Craniotomy for open biopsy;  Surgeon: Winfield Cunas, MD;  Location: [facility];  Service: Neurosurgery;  Laterality: N/A;  Craniotomy for open biopsy  . CARDIAC CATHETERIZATION    . CORONARY ANGIOPLASTY WITH STENT PLACEMENT    . CORONARY/GRAFT ACUTE MI REVASCULARIZATION N/A 07/15/2019   Procedure: Coronary/Graft Acute MI Revascularization;  Surgeon: Adrian Prows, MD;  Location: [facility];  Service: Cardiovascular;  Laterality: N/A;  . LEFT HEART CATH AND CORONARY ANGIOGRAPHY N/A 07/15/2019   Procedure: LEFT HEART CATH AND CORONARY ANGIOGRAPHY;  Surgeon: Adrian Prows, MD;  Location: [facility];  Service: Cardiovascular;  Laterality: N/A;    FAMILY HISTORY: The patient family history includes Bone cancer in his mother; Breast cancer in his mother; Diabetes in his brother, father,  and paternal uncle; Heart disease in his father.   SOCIAL HISTORY:  The patient  reports that he has been smoking cigarettes. He has a 33.00 pack-year smoking history. He has never used smokeless tobacco. He reports previous alcohol use. He reports current drug use. Drug: Marijuana.  Review of Systems  Constitutional: Negative for chills and fever.  HENT: Negative for ear discharge, ear pain and nosebleeds.   Eyes: Negative for blurred vision and discharge.  Cardiovascular: Negative for chest pain, claudication, dyspnea on exertion, leg swelling, near-syncope, orthopnea, palpitations, paroxysmal nocturnal dyspnea and syncope.  Respiratory: Negative for cough and shortness of breath.   Endocrine: Negative for polydipsia, polyphagia and polyuria.  Hematologic/Lymphatic: Negative for bleeding problem.  Skin: Negative for flushing and nail changes.  Musculoskeletal: Negative for muscle cramps, muscle weakness and myalgias.  Gastrointestinal: Negative for abdominal pain, dysphagia, hematemesis, hematochezia, melena, nausea and vomiting.  Neurological: Negative for dizziness, focal weakness and light-headedness.   PHYSICAL EXAM: Vitals with BMI 02/11/2020 02/05/2020 11/01/2019  Height 6' 0" 6' 0" 6' 0"  Weight 170 lbs 168 lbs 5 oz 166 lbs  BMI 23.05 15.40 08.67  Systolic 619 509 326  Diastolic 71 73 69  Pulse 83 76 75   Orthostatic VS for the past 72 hrs (Last 3 readings):  Patient Position BP Location Cuff Size  02/11/20 1047 Sitting Left Arm Normal   Constitutional: He isoriented to person, place, and time. He appearswell-developedand well-nourished.No distress.  HENT:  Head:Normocephalicand atraumatic.  Eyes: Right eye exhibitsno discharge. Left eye exhibitsno discharge.No scleral icterus.  Neck:No JVDpresent. No tracheal deviationpresent.  Cardiovascular:Normal rateand regular rhythm. Exam revealsno friction rub. No murmurheard. Pulmonary/Chest:Breath sounds normal.  Norespiratory distress. He hasno wheezes. He hasno rales.  Abdominal:Soft.Bowel sounds are normal. He exhibitsno distension. There isno abdominal tenderness. There isno reboundand no guarding.  Musculoskeletal:  General: No edema.Normal range of motion.  Cervical back: Normal range of motionand neck supple.  Neurological: He is alertand oriented to person, place, and time. Nocranial nerve deficit.  Skin: Skin isdry.No rashnoted. He is not diaphoretic. Noerythema.  Psychiatric: He has anormal mood and affect. Hisbehavior is normal.Judgmentand thought contentnormal.   Cardiac Studies: EKG  07/14/2019: Normal sinus rhythm, no evidence of ischemia. Compared to prior EKG done a few minutes ago, minimal ST elevation in V1 through V3 which does not meet STEMI criteria appears to have improved.  EKG 02/11/2020: Normal sinus rhythm, 84 bpm, right axis deviation, old anteroseptal infarct, T wave inversions in high lateral leads possible ischemia.  No significant change compared to prior EKG 08/20/2019.  Echocardiogram: 07/15/2019: LVEF 30-35%, moderate to severely decreased function, with regional wall motion abnormalities, grade 1 diastolic impairment, no mural thrombus, no pulmonary hypertension.  09/12/2019: LVEF 40-45%, mild concentric LVH, mild to distal anteroseptal and apical wall hypokinesis, mild MR.  Left Heart Catheterization 07/15/19: LV: Mid to distal anterior, anterolateral and apical akinesis, EF 35 to at most 40%. Hand contrast injection, not well visualized. Normal LVEDP. RCA: Dominant, mild diffuse disease. Left main: Mildly calcified. LAD: Diffusely diseased proximal segment, mid segment had a 90% stenosis. Mid to distal segment has minimal disease and apical LAD had 99% stenosis. Successful PTCA and stenting of the proximal/ostial and mid LAD with implantation of a long 3.0 x 38 mm resolute Onyx DES postdilated with 3.5 x 12 mm noncompliant  sapphire balloon. 90% stenosis reduced to 0% with TIMI-3 to TIMI-3 flow. Apical LAD stenosis left alone. Ramus intermediate: Large vessel, ostium has ulcerated appearing 99% stenosis. Upon angioplasty realized it was also calcified and probably chronic. Successful PTCA and stenting with 2 overlapping 2.5 x 15 and distally 2.5 x 13 mm resolute Onyx DES. 9 9% stenosis reduced to 0% with TIMI-3 to TIMI-3 flow. Circumflex: Moderate to large vessel, proximal to mid segment is diffusely diseased 30%. Large OM1 with proximal long 60% to at most 70% stenosis.  48 hour Holter monitor: Dominant rhythm normal sinus, followed by tachycardia (27% burden). Heart rate 84-125 bpm.  Average heart rate 97 bpm. No atrial fibrillation and no sinus pause greater than or equal to 2.5 seconds in duration. No episodes of nonsustained ventricular tachycardia. 7 isolated ventricular ectopic beats, 0 ventricular pairs and 0 ventricular runs, total ventricular ectopic burden <0.01%. 11 isolated supraventricular ectopic beats, 0 supraventricular runs, total supraventricular ectopic burden 0.01%. No patient diary submitted as a part of the study.  LABORATORY DATA: CBC Latest Ref Rng & Units 07/16/2019 07/15/2019 07/14/2019  WBC 4.0 - 10.5 K/uL 21.8(H) 20.2(H) 17.0(H)  Hemoglobin 13.0 - 17.0 g/dL 14.5 17.1(H) 18.1(H)  Hematocrit 39 - 52 % 42.4 49.7 54.0(H)  Platelets  150 - 400 K/uL 299 330 345    CMP Latest Ref Rng & Units 07/24/2019 07/16/2019 07/15/2019  Glucose 65 - 99 mg/dL 189(H) 156(H) 184(H)  BUN 6 - 24 mg/dL 12 36(H) 10  Creatinine 0.76 - 1.27 mg/dL 0.71(L) 0.67 0.75  Sodium 134 - 144 mmol/L 138 141 139  Potassium 3.5 - 5.2 mmol/L 4.4 3.8 3.7  Chloride 96 - 106 mmol/L 100 112(H) 103  CO2 20 - 29 mmol/L 22 18(L) 24  Calcium 8.7 - 10.2 mg/dL 9.4 9.1 9.1  Total Protein 6.4 - 8.3 g/dL - - -  Total Bilirubin 0.20 - 1.20 mg/dL - - -  Alkaline Phos 40 - 150 U/L - - -  AST 5 - 34 U/L - - -  ALT 0 - 55 U/L - - -     Lipid Panel     Component Value Date/Time   CHOL 111 09/12/2019 0804   TRIG 105 09/12/2019 0804   HDL 36 (L) 09/12/2019 0804   CHOLHDL 5.7 07/14/2019 2336   VLDL 25 07/14/2019 2336   LDLCALC 55 09/12/2019 0804   LABVLDL 20 09/12/2019 0804    Lab Results  Component Value Date   HGBA1C 10.6 (H) 07/14/2019   HGBA1C 10.7 (A) 03/27/2018   HGBA1C 9.4 06/27/2017   No components found for: NTPROBNP Lab Results  Component Value Date   TSH 1.073 07/18/2011    FINAL MEDICATION LIST END OF ENCOUNTER: No orders of the defined types were placed in this encounter.   There are no discontinued medications.   Current Outpatient Medications:  .  aspirin 81 MG chewable tablet, Chew 1 tablet (81 mg total) by mouth daily., Disp: 90 tablet, Rfl: 3 .  atorvastatin (LIPITOR) 80 MG tablet, Take 1 tablet (80 mg total) by mouth daily at 6 PM., Disp: 90 tablet, Rfl: 3 .  blood glucose meter kit and supplies KIT, Dispense based on patient and insurance preference. Use up to four times daily as directed. (FOR ICD-9 250.00, 250.01)., Disp: 1 each, Rfl: 0 .  Blood Pressure Monitoring (BLOOD PRESSURE CUFF) MISC, 1 each by Does not apply route daily as needed., Disp: 1 each, Rfl: 0 .  clopidogrel (PLAVIX) 75 MG tablet, Take 1 tablet (75 mg total) by mouth daily., Disp: 90 tablet, Rfl: 3 .  Empagliflozin-metFORMIN HCl 12.08-998 MG TABS, Take 1 tablet by mouth 2 (two) times daily., Disp: 180 tablet, Rfl: 1 .  gabapentin (NEURONTIN) 300 MG capsule, Take 1 capsule (300 mg total) by mouth at bedtime., Disp: 90 capsule, Rfl: 3 .  Insulin Glargine (BASAGLAR KWIKPEN) 100 UNIT/ML, Inject 0.12 mLs (12 Units total) into the skin daily. (Patient taking differently: Inject 10 Units into the skin daily. ), Disp: 15 mL, Rfl: 0 .  Insulin Pen Needle (PEN NEEDLES) 32G X 4 MM MISC, 1 pen by Does not apply route as directed., Disp: 100 each, Rfl: 11 .  lisinopril (ZESTRIL) 5 MG tablet, Take 1 tablet (5 mg total) by mouth  daily. (Patient taking differently: Take 2.5 mg by mouth daily. ), Disp: 90 tablet, Rfl: 3 .  magnesium oxide (MAG-OX) 400 (241.3 Mg) MG tablet, Take 0.5 tablets (200 mg total) by mouth 2 (two) times daily., Disp: 90 tablet, Rfl: 1 .  metoprolol succinate (TOPROL-XL) 100 MG 24 hr tablet, Take 1 tablet (100 mg total) by mouth in the morning. Take with or immediately following a meal. Hold if systolic blood pressure (top blood pressure number) less than 100 mmHg or heart  rate less than 60 bpm (pulse)., Disp: 90 tablet, Rfl: 0 .  nitroGLYCERIN (NITROSTAT) 0.4 MG SL tablet, Place 1 tablet (0.4 mg total) under the tongue every 5 (five) minutes x 3 doses as needed for chest pain., Disp: 90 tablet, Rfl: 3 .  oxyCODONE-acetaminophen (PERCOCET/ROXICET) 5-325 MG tablet, TAKE 1 TABLET BY MOUTH AT BEDTIME. MAY REPEAT 1 ADDITIONAL TABLET AS NEEDED, Disp: 90 tablet, Rfl: 0 .  phenytoin (DILANTIN) 100 MG ER capsule, Take 3 capsules every morning, Disp: 270 capsule, Rfl: 3  IMPRESSION:    ICD-10-CM   1. Coronary artery disease involving native coronary artery of native heart without angina pectoris  I25.10 EKG 12-Lead    PCV ECHOCARDIOGRAM COMPLETE  2. S/P angioplasty with stent  Z95.820 PCV ECHOCARDIOGRAM COMPLETE  3. Non-ST elevation (NSTEMI) myocardial infarction ([facility])  I21.4   4. Ischemic cardiomyopathy  I25.5   5. Nonsustained ventricular tachycardia ([facility])  I47.2   6. Mixed hyperlipidemia  E78.2   7. Type 2 diabetes mellitus with other circulatory complication, with long-term current use of insulin ([facility])  E11.59    Z79.4   8. Tobacco use  Z72.0   9. Astrocytoma of frontal lobe ([facility])  C71.1      RECOMMENDATIONS: Jonathan Barker is a 54 y.o. male whose past medical history and cardiac risk factors include: History of astrocytoma, non-STEMI, nonsustained ventricular tachycardia, established coronary artery disease a status post angioplasty and stenting, ischemic cardiomyopathy, uncontrolled diabetes  mellitus type 2, hyperlipidemia, tobacco use.  Established coronary artery disease status post stenting:  Continue dual antiplatelet therapy.  Denies any recent anginal discomfort.  No use of sublingual nitroglycerin tablets.  Continue ACE inhibitors.  Continue with guideline directed medical therapy.  Currently not uptitrating medical therapy as his systolic blood pressures are less than 100 mmHg but remains asymptomatic.  Plan a repeat echocardiogram prior to the next office visit is a 1 year follow-up.  Ischemic cardiomyopathy: Chronic and stable.  Medications reconciled.  See above.  History of nonsustained ventricular tachycardia: Continue beta-blocker therapy.  No recurrence on repeat Holter and beta blocker tx.   Active tobacco use: Continues to smoke 1 ppd.  Educated on the importance of complete smoking cessation.    Insulin-dependent diabetes mellitus type 2: Currently managed by primary care provider.  Educated on importance of glycemic control given his underlying CAD.  Patient chooses not to be on insulin.  He is tolerating his oral medications without any side effects or intolerances.  Mixed hyperlipidemia:  Currently on high intensity statin therapy after his non-STEMI.  Fasting lipid profile reviewed with the patient.  Most recent LDL less than 70 mg/dL.  Total Time spent: 33 min.   --Continue cardiac medications as reconciled in final medication list. --Return in about 7 months (around 09/26/2020) for Follow up, CAD, Review test results. Or sooner if needed. --Continue follow-up with your primary care physician regarding the management of your other chronic comorbid conditions.  Patient's questions and concerns were addressed to his satisfaction. He voices understanding of the instructions provided during this encounter.   This note was created using a voice recognition software as a result there may be grammatical errors inadvertently enclosed that do not  reflect the nature of this encounter. Every attempt is made to correct such errors.  Rex Kras, [place], [facility]  Pager: [phone] Office: [phone]

## 2020-02-12 ENCOUNTER — Other Ambulatory Visit: Payer: Self-pay

## 2020-02-12 DIAGNOSIS — I2510 Atherosclerotic heart disease of native coronary artery without angina pectoris: Secondary | ICD-10-CM

## 2020-02-12 DIAGNOSIS — Z95820 Peripheral vascular angioplasty status with implants and grafts: Secondary | ICD-10-CM

## 2020-02-27 ENCOUNTER — Other Ambulatory Visit: Payer: Self-pay | Admitting: Urology

## 2020-02-28 ENCOUNTER — Other Ambulatory Visit: Payer: Self-pay | Admitting: Urology

## 2020-02-29 MED FILL — OXYCODONE-APAP 5-325MG: 5-325 | 45 days supply | Qty: 90 | Fill #0

## 2020-03-05 ENCOUNTER — Telehealth: Payer: Self-pay | Admitting: Radiation Oncology

## 2020-03-05 NOTE — Telephone Encounter (Signed)
-----   Message from [name], [place] sent at 02/28/2020  5:22 PM EDT ----- Regarding: RE: Percocet refill Please let [name] know that I have sent his Rx and should be ready for pick up on Friday. Lia Foyer ----- Message ----- From: Heywood Footman, RN Sent: 02/25/2020  12:20 PM EDT To: [name], PA-C Subject: Percocet refill                                Ashlyn.   Jonathan Barker called today requesting his Percocet refill. He express his intention to pick it up Friday, October 29th.   [name]  Diagnosis:   54 y.o.  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

## 2020-03-05 NOTE — Telephone Encounter (Signed)
Phoned patient to confirm he picked up his percocet prescription refilled by Ashlyn Bruning, PA-C. He confirmed he did. Patient denies additional needs at this time.

## 2020-03-13 ENCOUNTER — Ambulatory Visit: Payer: Medicare Other | Admitting: Neurology

## 2020-03-13 ENCOUNTER — Encounter: Payer: Self-pay | Admitting: Neurology

## 2020-03-13 DIAGNOSIS — Z029 Encounter for administrative examinations, unspecified: Secondary | ICD-10-CM

## 2020-04-05 MED FILL — GABAPENTIN 300 MG CAPSULE: 300 | 90 days supply | Qty: 90 | Fill #2

## 2020-04-11 ENCOUNTER — Other Ambulatory Visit: Payer: Self-pay | Admitting: Urology

## 2020-04-11 MED ORDER — OXYCODONE-ACETAMINOPHEN 5-325 MG PO TABS: ORAL_TABLET | ORAL | 0 refills | Status: DC

## 2020-04-12 MED FILL — OXYCODONE-APAP 5-325MG: 5-325 | 45 days supply | Qty: 90 | Fill #0

## 2020-04-14 ENCOUNTER — Other Ambulatory Visit: Payer: Self-pay | Admitting: Neurology

## 2020-04-14 ENCOUNTER — Telehealth: Payer: Self-pay | Admitting: Radiation Oncology

## 2020-04-14 DIAGNOSIS — G40109 Localization-related (focal) (partial) symptomatic epilepsy and epileptic syndromes with simple partial seizures, not intractable, without status epilepticus: Secondary | ICD-10-CM

## 2020-04-14 MED FILL — PHENYTOIN SODIUM EXTENDED 1: 100 | 90 days supply | Qty: 90 | Fill #0

## 2020-04-14 NOTE — Telephone Encounter (Signed)
-----   Message from Jonathan Barker, [place] sent at 04/11/2020  3:34 PM EST ----- Regarding: RE: Pain Medication Refill Request Please let him know that the Rx will be available for pick up on Monday 04/14/20. -Ashlyn ----- Message ----- From: Heywood Footman, RN Sent: 04/11/2020  12:21 PM EST To: [name], MD, [name], PA-C, # Subject: Pain Medication Refill Request                 Ashlyn.  Jonathan Barker has called requesting a refill of his Percocet. He continues to use [employer]. You last gave 90 tablets on 02/28/2020. He would like to pick this up on Monday, please.   [name]  Diagnosis:   54 y.o.  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

## 2020-04-14 NOTE — Telephone Encounter (Signed)
Phoned patient as requested by [name], PA-C. No answer. Left voicemail message explaining the refill he requested has been sent electronically to [facility]. Advised it should be ready for pick up now. Encouraged patient to contact this RN with future needs or questions.

## 2020-04-28 MED FILL — CLOPIDOGREL 75 MG TABLET: 75 | 90 days supply | Qty: 90 | Fill #2

## 2020-04-28 MED FILL — PHENYTOIN SODIUM EXTENDED 1: 100 | 30 days supply | Qty: 90 | Fill #0

## 2020-05-08 MED FILL — ATORVASTATIN 80 MG TABLET: 80 | 90 days supply | Qty: 90 | Fill #2

## 2020-05-23 ENCOUNTER — Other Ambulatory Visit: Payer: Self-pay | Admitting: Urology

## 2020-05-23 MED ORDER — OXYCODONE-ACETAMINOPHEN 5-325 MG PO TABS: ORAL_TABLET | ORAL | 0 refills | Status: DC

## 2020-05-26 MED FILL — OXYCODONE-APAP 5-325MG: 5-325 | 45 days supply | Qty: 90 | Fill #0

## 2020-06-02 ENCOUNTER — Telehealth: Payer: Self-pay | Admitting: Radiation Oncology

## 2020-06-02 MED FILL — OXYCODONE-APAP 5-325MG: 5-325 | 45 days supply | Qty: 90 | Fill #0

## 2020-06-02 MED FILL — METOPROLOL TARTRATE 50 MG T: 50 | 90 days supply | Qty: 180 | Fill #2

## 2020-06-02 NOTE — Telephone Encounter (Signed)
Received voicemail from patient that he had to pay out of pocket for his prescription at [facility]. Spoke with Manuela Schwartz at [employer]. Manuela Schwartz explains that insurance will only cover a seven day supply unless a prior authorization is received. This RN requested that be faxed over.  Phoned patient back and explained the above. Explained this RN will phone back with an update.  Patient verbalized understanding.

## 2020-06-06 ENCOUNTER — Encounter: Payer: Self-pay | Admitting: *Deleted

## 2020-06-06 NOTE — Progress Notes (Signed)

## 2020-06-10 NOTE — Progress Notes (Signed)
Things That May Be Affecting Your Health:  Alcohol  Hearing loss  Pain    Depression  Home Safety  Sexual Health   Diabetes  Lack of physical activity  Stress   Difficulty with daily activities  Loneliness  Tiredness   Drug use  Medicines  Tobacco use   Falls  Motor Vehicle Safety  Weight   Food choices  Oral Health  Other    YOUR PERSONALIZED HEALTH PLAN : 1. Schedule your next subsequent Medicare Wellness visit in one year 2. Attend all of your regular appointments to address your medical issues 3. Complete the preventative screenings and services   Annual Wellness Visit   Medicare Covered Preventative Screenings and [place] Men and Women Who How Often Need? Date of Last Service Action  Abdominal Aortic Aneurysm Adults with AAA risk factors Once     Alcohol Misuse and Counseling All Adults Screening once a year if no alcohol misuse. Counseling up to 4 face to face sessions. Yes   Please screen for alcohol misuse   Bone Density Measurement  Adults at risk for osteoporosis Once every 2 yrs     Lipid Panel Z13.6 All adults without CV disease Once every 5 yrs     Colorectal Cancer   Stool sample or  Colonoscopy All adults 46 and older   Once every year  Every 10 years Yes   Please offer either immunochemical FOBT or referral for colonoscopy   Depression All Adults Once a year  Today   Diabetes Screening Blood glucose, post glucose load, or GTT Z13.1  All adults at risk  Pre-diabetics  Once per year  Twice per year     Diabetes  Self-Management Training All adults Diabetics 10 hrs first year; 2 hours subsequent years. Requires Copay Yes    Glaucoma  Diabetics  Family history of glaucoma  African Americans 68 yrs +  Hispanic Americans 53 yrs + Annually - requires coppay     Hepatitis C Z72.89 or F19.20  High Risk for HCV  Born between 1945 and 1965  Annually  Once Yes   Please check   HIV Z11.4 All adults based on risk  Annually btw  ages 61 & 75 regardless of risk  Annually > 65 yrs if at increased risk Yes   Please check   Lung Cancer Screening Asymptomatic adults aged 44-77 with 30 pack yr history and current smoker OR quit within the last 15 yrs Annually Must have counseling and shared decision making documentation before first screen     Medical Nutrition Therapy Adults with   Diabetes  Renal disease  Kidney transplant within past 3 yrs 3 hours first year; 2 hours subsequent years     Obesity and Counseling All adults Screening once a year Counseling if BMI 30 or higher  Today   Tobacco Use Counseling Adults who use tobacco  Up to 8 visits in one year Yes   Please counsel cessation   Vaccines Z23  Hepatitis B  Influenza   Pneumonia  Adults   Once  Once every flu season  Two different vaccines separated by one year Yes   Needs flu shot. Please counsel patient on COVID-19 vaccine as well.   Next Annual Wellness Visit People with Medicare Every year  Today     [place] Women Who How Often Need  Date of Last Service Action  Mammogram  Z12.31 Women over 45 One baseline ages 54-39. Annually ager 68  yrs+     Pap tests All women Annually if high risk. Every 2 yrs for normal risk women     Screening for cervical cancer with   Pap (Z01.419 nl or Z01.411abnl) &  HPV Z11.51 Women aged 38 to 23 Once every 5 yrs     Screening pelvic and breast exams All women Annually if high risk. Every 2 yrs for normal risk women     Sexually Transmitted Diseases  Chlamydia  Gonorrhea  Syphilis All at risk adults Annually for non pregnant females at increased risk         [place] Men Who How Ofter Need  Date of Last Service Action  Prostate Cancer - DRE & PSA Men over 50 Annually.  DRE might require a copay.     Sexually Transmitted Diseases  Syphilis All at risk adults Annually for men at increased risk

## 2020-07-03 ENCOUNTER — Other Ambulatory Visit: Payer: Self-pay | Admitting: Urology

## 2020-07-03 ENCOUNTER — Other Ambulatory Visit: Payer: Self-pay | Admitting: Neurology

## 2020-07-03 ENCOUNTER — Other Ambulatory Visit: Payer: Self-pay | Admitting: Radiation Therapy

## 2020-07-03 DIAGNOSIS — G40109 Localization-related (focal) (partial) symptomatic epilepsy and epileptic syndromes with simple partial seizures, not intractable, without status epilepticus: Secondary | ICD-10-CM

## 2020-07-03 MED FILL — GABAPENTIN 300 MG CAPSULE: 300 | 90 days supply | Qty: 90 | Fill #3

## 2020-07-04 ENCOUNTER — Other Ambulatory Visit: Payer: Self-pay | Admitting: Neurology

## 2020-07-04 MED FILL — PHENYTOIN SODIUM EXTENDED 1: 100 | 30 days supply | Qty: 90 | Fill #0

## 2020-07-09 ENCOUNTER — Other Ambulatory Visit: Payer: Self-pay | Admitting: Urology

## 2020-07-09 ENCOUNTER — Other Ambulatory Visit: Payer: Self-pay | Admitting: Radiation Oncology

## 2020-07-09 MED ORDER — OXYCODONE-ACETAMINOPHEN 5-325 MG PO TABS: ORAL_TABLET | ORAL | 0 refills | Status: DC

## 2020-07-10 ENCOUNTER — Telehealth: Payer: Self-pay | Admitting: Radiation Oncology

## 2020-07-10 ENCOUNTER — Other Ambulatory Visit: Payer: Self-pay | Admitting: Internal Medicine

## 2020-07-10 DIAGNOSIS — E119 Type 2 diabetes mellitus without complications: Secondary | ICD-10-CM

## 2020-07-10 MED FILL — SYNJARDY 12.5-1000 MG TABS: 12.5-1000 | 90 days supply | Qty: 180 | Fill #0

## 2020-07-10 NOTE — Telephone Encounter (Signed)
Approved 90 day refill to bridge until he can follow up with me in clinic.

## 2020-07-10 NOTE — Telephone Encounter (Signed)
Last office visit: 07/25/2019 Next appt: none scheduled  Message sent to front office to assist with scheduling

## 2020-07-10 NOTE — Telephone Encounter (Signed)
Phoned patient to ensure he is aware that [place] sent his Percocet script to [facility] as requested. Patient explains he picked up his seizure medication but isn't able to pick up his the percocet until 07/15/20 "its too early". Patient verbalizes his insurance will only cover a 7 day supply then he has to pay out of pocket. Knowing patient is on a fixed income I phoned the pharmacy to get more details.   Spoke with Brayton Layman at [facility] to inquire about why the patient is paying out of pocket. Brayton Layman confirms the prior authorization that was need has been received but now a provider has to call [phone] to request the 7 day supply restriction be lifted since the patient needs the medication to manage his cancer pain.   Phoned the number provided by [facility]. I was told that the patient needs to contact his insurance company using the number on the back of his insurance card and update his information before the restriction can be lifted.   Phoned patient back to explain this. No answer. Left detailed voicemail message explaining what he needed to do. Provided direct number for him to call me back once he has updated his insurance information.

## 2020-07-11 DIAGNOSIS — L84 Corns and callosities: Secondary | ICD-10-CM | POA: Diagnosis not present

## 2020-07-11 DIAGNOSIS — L603 Nail dystrophy: Secondary | ICD-10-CM | POA: Diagnosis not present

## 2020-07-11 DIAGNOSIS — B351 Tinea unguium: Secondary | ICD-10-CM | POA: Diagnosis not present

## 2020-07-11 DIAGNOSIS — E1142 Type 2 diabetes mellitus with diabetic polyneuropathy: Secondary | ICD-10-CM | POA: Diagnosis not present

## 2020-07-11 MED FILL — OXYCODONE-APAP 5-325MG: 5-325 | 45 days supply | Qty: 90 | Fill #0

## 2020-07-15 ENCOUNTER — Encounter: Payer: Self-pay | Admitting: Genetic Counselor

## 2020-08-01 ENCOUNTER — Other Ambulatory Visit: Payer: Self-pay | Admitting: Cardiology

## 2020-08-01 ENCOUNTER — Other Ambulatory Visit: Payer: Self-pay

## 2020-08-01 ENCOUNTER — Ambulatory Visit
Admission: RE | Admit: 2020-08-01 | Discharge: 2020-08-01 | Disposition: A | Discharge status: Home / Self Care | Payer: Medicare Other | Source: Ambulatory Visit | Attending: Internal Medicine | Admitting: Internal Medicine

## 2020-08-01 DIAGNOSIS — C711 Malignant neoplasm of frontal lobe: Secondary | ICD-10-CM

## 2020-08-01 DIAGNOSIS — C719 Malignant neoplasm of brain, unspecified: Secondary | ICD-10-CM | POA: Diagnosis not present

## 2020-08-01 DIAGNOSIS — H748X2 Other specified disorders of left middle ear and mastoid: Secondary | ICD-10-CM | POA: Diagnosis not present

## 2020-08-01 DIAGNOSIS — J3489 Other specified disorders of nose and nasal sinuses: Secondary | ICD-10-CM | POA: Diagnosis not present

## 2020-08-01 DIAGNOSIS — G9389 Other specified disorders of brain: Secondary | ICD-10-CM | POA: Diagnosis not present

## 2020-08-01 MED ORDER — GADOBENATE DIMEGLUMINE 529 MG/ML IV SOLN
17.0000 mL | Freq: Once | INTRAVENOUS | Status: AC | PRN
  Administered 2020-08-01: 17 mL via INTRAVENOUS

## 2020-08-02 ENCOUNTER — Other Ambulatory Visit (HOSPITAL_COMMUNITY): Payer: Self-pay

## 2020-08-02 MED FILL — Clopidogrel Bisulfate Tab 75 MG (Base Equiv): ORAL | 90 days supply | Qty: 90 | Fill #0 | Status: AC

## 2020-08-02 MED FILL — Lisinopril Tab 5 MG: ORAL | 90 days supply | Qty: 90 | Fill #0 | Status: AC

## 2020-08-03 ENCOUNTER — Other Ambulatory Visit (HOSPITAL_COMMUNITY): Payer: Self-pay

## 2020-08-04 ENCOUNTER — Other Ambulatory Visit (HOSPITAL_COMMUNITY): Payer: Self-pay

## 2020-08-04 ENCOUNTER — Inpatient Hospital Stay: Payer: Medicare Other | Attending: Internal Medicine

## 2020-08-04 DIAGNOSIS — Z803 Family history of malignant neoplasm of breast: Secondary | ICD-10-CM | POA: Insufficient documentation

## 2020-08-04 DIAGNOSIS — Z794 Long term (current) use of insulin: Secondary | ICD-10-CM | POA: Insufficient documentation

## 2020-08-04 DIAGNOSIS — F1290 Cannabis use, unspecified, uncomplicated: Secondary | ICD-10-CM | POA: Insufficient documentation

## 2020-08-04 DIAGNOSIS — E119 Type 2 diabetes mellitus without complications: Secondary | ICD-10-CM | POA: Insufficient documentation

## 2020-08-04 DIAGNOSIS — C711 Malignant neoplasm of frontal lobe: Secondary | ICD-10-CM | POA: Insufficient documentation

## 2020-08-04 DIAGNOSIS — Z9049 Acquired absence of other specified parts of digestive tract: Secondary | ICD-10-CM | POA: Insufficient documentation

## 2020-08-04 DIAGNOSIS — Z808 Family history of malignant neoplasm of other organs or systems: Secondary | ICD-10-CM | POA: Insufficient documentation

## 2020-08-04 DIAGNOSIS — F17210 Nicotine dependence, cigarettes, uncomplicated: Secondary | ICD-10-CM | POA: Insufficient documentation

## 2020-08-04 DIAGNOSIS — G9389 Other specified disorders of brain: Secondary | ICD-10-CM | POA: Insufficient documentation

## 2020-08-04 DIAGNOSIS — Z8249 Family history of ischemic heart disease and other diseases of the circulatory system: Secondary | ICD-10-CM | POA: Insufficient documentation

## 2020-08-04 DIAGNOSIS — Z79899 Other long term (current) drug therapy: Secondary | ICD-10-CM | POA: Insufficient documentation

## 2020-08-04 DIAGNOSIS — I252 Old myocardial infarction: Secondary | ICD-10-CM | POA: Insufficient documentation

## 2020-08-04 DIAGNOSIS — Z833 Family history of diabetes mellitus: Secondary | ICD-10-CM | POA: Insufficient documentation

## 2020-08-04 DIAGNOSIS — I2510 Atherosclerotic heart disease of native coronary artery without angina pectoris: Secondary | ICD-10-CM | POA: Insufficient documentation

## 2020-08-05 ENCOUNTER — Inpatient Hospital Stay (HOSPITAL_BASED_OUTPATIENT_CLINIC_OR_DEPARTMENT_OTHER): Payer: Medicare Other | Admitting: Internal Medicine

## 2020-08-05 ENCOUNTER — Other Ambulatory Visit: Payer: Self-pay

## 2020-08-05 VITALS — BP 116/77 | HR 78 | Temp 97.0°F | Resp 18 | Wt 175.2 lb

## 2020-08-05 DIAGNOSIS — Z833 Family history of diabetes mellitus: Secondary | ICD-10-CM | POA: Diagnosis not present

## 2020-08-05 DIAGNOSIS — I252 Old myocardial infarction: Secondary | ICD-10-CM | POA: Diagnosis not present

## 2020-08-05 DIAGNOSIS — C711 Malignant neoplasm of frontal lobe: Secondary | ICD-10-CM | POA: Diagnosis not present

## 2020-08-05 DIAGNOSIS — Z9049 Acquired absence of other specified parts of digestive tract: Secondary | ICD-10-CM | POA: Diagnosis not present

## 2020-08-05 DIAGNOSIS — Z79899 Other long term (current) drug therapy: Secondary | ICD-10-CM | POA: Diagnosis not present

## 2020-08-05 DIAGNOSIS — F1290 Cannabis use, unspecified, uncomplicated: Secondary | ICD-10-CM | POA: Diagnosis not present

## 2020-08-05 DIAGNOSIS — Z803 Family history of malignant neoplasm of breast: Secondary | ICD-10-CM | POA: Diagnosis not present

## 2020-08-05 DIAGNOSIS — E119 Type 2 diabetes mellitus without complications: Secondary | ICD-10-CM | POA: Diagnosis not present

## 2020-08-05 DIAGNOSIS — F17210 Nicotine dependence, cigarettes, uncomplicated: Secondary | ICD-10-CM | POA: Diagnosis not present

## 2020-08-05 DIAGNOSIS — Z8249 Family history of ischemic heart disease and other diseases of the circulatory system: Secondary | ICD-10-CM | POA: Diagnosis not present

## 2020-08-05 DIAGNOSIS — Z794 Long term (current) use of insulin: Secondary | ICD-10-CM | POA: Diagnosis not present

## 2020-08-05 DIAGNOSIS — I2510 Atherosclerotic heart disease of native coronary artery without angina pectoris: Secondary | ICD-10-CM | POA: Diagnosis not present

## 2020-08-05 DIAGNOSIS — G9389 Other specified disorders of brain: Secondary | ICD-10-CM | POA: Diagnosis not present

## 2020-08-05 DIAGNOSIS — Z808 Family history of malignant neoplasm of other organs or systems: Secondary | ICD-10-CM | POA: Diagnosis not present

## 2020-08-05 NOTE — Progress Notes (Signed)
Jonathan Barker at [place],  [id number] [phone]   Interval Evaluation  Date of Service: 08/05/20 Patient Name: Jonathan Barker Patient MRN: [id number] Patient DOB: 08/22/1965 Provider: Ventura Sellers, MD  Identifying Statement:  Jonathan Barker is a 55 y.o. male with right frontal WHO grade II glioma   Oncologic History: Oncology History  Astrocytoma of frontal lobe ([place])  05/18/2013 Imaging   After experiencing focal seizure, MRI demonstrates non-enhancing mass in the right frontal lobe with callosal extension into left frontal white matter   05/24/2013 Surgery   Biopsy with Dr. Christella Noa, pathology demonstrates WHO grade II diffuse astrocytoma   06/18/2013 - 09/03/2013 Radiation Therapy   54 Gy IMRT in 30 fx, concurrent Temodar, with Dr. 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  The CNS/Brain Cancer Panel offered by [facility] includes sequencing and/or deletion duplication testing of the following 42 genes: ALK, APC, BAP1, BARD1, CDK4, CDKN2A, DICER1, EPCAM, EZH2, GPC3, HRAS, KIF1B, MEN1, MLH1, MSH2, MSH6, NF1, NF2, PHOX2B, PMS2, PO1, PRKAR1A, PTCH2, PTEN, RB1, SMARCA4, SMARCB1, SMARCE1, SUFU, TP53, TSC1, TSC2, and VHL.  The report date is June 30, 2015. 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  The updated report date is July 11, 2020.     Biomarkers:  MGMT Unknown.  IDH 1/2 Unknown.  EGFR Unknown  TERT Unknown   Interval History:  GARET HOOTON presents today for follow up after MRI brain.  No new or progressive deficits.  No seizures or headaches.  Continues to follow with cardiology for post-MI care. Golfing frequently, remains active.  He is trying to cut down on sugar sweetened beverages, but still consuming multiple sodas per day.  H+P (03/31/17) Patient presented initially in early 2015 with a focal seizure, described  as "left side clenching up", "change in awareness without loss in memory".  This event led to an MRI brain which demonstrated a likely mass centered in the right frontal lobe.  This underwent biopsy on 05/24/13, pathology revealed grade II glioma.  No genetic markers were sent at that time.  Following surgery he underwent radiation therapy as above, with concurrent Temodar.  Although he did consult with a neuro-oncologist in [place], no further therapy was administered, and he has been on observation for the past 3 years.  He return today with an MRI for evaluation.  He denies any recent seizures, does complain of 3-4 headaches per week as prior.    Medications: Current Outpatient Medications on File Prior to Visit  Medication Sig Dispense Refill  . aspirin 81 MG chewable tablet Chew 1 tablet (81 mg total) by mouth daily. 90 tablet 3  . atorvastatin (LIPITOR) 80 MG tablet TAKE 1 TABLET (80 MG TOTAL) BY MOUTH DAILY AT 6 PM. 90 tablet 2  . blood glucose meter kit and supplies KIT Dispense based on patient and insurance preference. Use up to four times daily as directed. (FOR ICD-9 250.00, 250.01). 1 each 0  . Blood Pressure Monitoring (BLOOD PRESSURE CUFF) MISC 1 each by Does not apply route daily as needed. 1 each 0  . clopidogrel (PLAVIX) 75 MG tablet TAKE 1 TABLET BY MOUTH ONCE A DAY (NEEDS APPT) 90 tablet 0  . Empagliflozin-metFORMIN HCl 12.08-998 MG TABS TAKE 1 TABLET BY MOUTH TWICE DAILY 180 tablet 0  . gabapentin (NEURONTIN) 300 MG capsule TAKE 1 CAPSULE BY MOUTH AT BEDTIME 90  capsule 3  . Insulin Glargine (BASAGLAR KWIKPEN) 100 UNIT/ML Inject 0.12 mLs (12 Units total) into the skin daily. (Patient taking differently: Inject 10 Units into the skin daily. ) 15 mL 0  . Insulin Pen Needle (PEN NEEDLES) 32G X 4 MM MISC 1 pen by Does not apply route as directed. 100 each 11  . lisinopril (ZESTRIL) 5 MG tablet TAKE 1 TABLET BY MOUTH ONCE A DAY (NEEDS APPT) 90 tablet 0  . magnesium oxide (MAG-OX) 400  (241.3 Mg) MG tablet Take 0.5 tablets (200 mg total) by mouth 2 (two) times daily. 90 tablet 1  . metoprolol succinate (TOPROL-XL) 100 MG 24 hr tablet Take 1 tablet (100 mg total) by mouth in the morning. Take with or immediately following a meal. Hold if systolic blood pressure (top blood pressure number) less than 100 mmHg or heart rate less than 60 bpm (pulse). 90 tablet 0  . metoprolol tartrate (LOPRESSOR) 50 MG tablet Take 50 mg by mouth 2 (two) times daily.    . nitroGLYCERIN (NITROSTAT) 0.4 MG SL tablet Place 1 tablet (0.4 mg total) under the tongue every 5 (five) minutes x 3 doses as needed for chest pain. 90 tablet 3  . oxyCODONE-acetaminophen (PERCOCET/ROXICET) 5-325 MG tablet TAKE 1 TABLET BY MOUTH AT BEDTIME. MAY REPEAT 1 ADDITIONAL TABLET AS NEEDED 90 tablet 0  . oxyCODONE-acetaminophen (PERCOCET/ROXICET) 5-325 MG tablet TAKE 1 TABLET BY MOUTH AT BEDTIME MAY REPEAT 1 ADDITIONAL TABLET AS NEEDED 90 tablet 0  . phenytoin (DILANTIN) 100 MG ER capsule TAKE 3 CAPSULES BY MOUTH EVERY MORNING 90 capsule 6   No current facility-administered medications on file prior to visit.    Allergies: No Known Allergies Past Medical History:  Past Medical History:  Diagnosis Date  . ACS (acute coronary syndrome) ([place])   . Astrocytoma of frontal lobe ([place]) 05/31/2013  . Coronary artery disease   . DM (diabetes mellitus) ([place]) 08/09/2011   A1c at diagnosis = 12.4  . Family history of breast cancer   . GERD (gastroesophageal reflux disease) 08/09/2011  . H/O heart artery stent 07/14/2018  . Hyperlipidemia   . LAD stenosis   . Non-ST elevation (NSTEMI) myocardial infarction ([place]) 07/15/2019  . Nonsustained ventricular tachycardia ([place])   . NSVT (nonsustained ventricular tachycardia) ([place])   . Seizure ([place]) 05/19/2013  . Shortness of breath    Past Surgical History:  Past Surgical History:  Procedure Laterality Date  . APPENDECTOMY    . BRAIN BIOPSY N/A 05/24/2013   Procedure: Craniotomy for open  biopsy;  Surgeon: Winfield Cunas, MD;  Location: [facility];  Service: Neurosurgery;  Laterality: N/A;  Craniotomy for open biopsy  . CARDIAC CATHETERIZATION    . CORONARY ANGIOPLASTY WITH STENT PLACEMENT    . CORONARY/GRAFT ACUTE MI REVASCULARIZATION N/A 07/15/2019   Procedure: Coronary/Graft Acute MI Revascularization;  Surgeon: Adrian Prows, MD;  Location: [facility];  Service: Cardiovascular;  Laterality: N/A;  . LEFT HEART CATH AND CORONARY ANGIOGRAPHY N/A 07/15/2019   Procedure: LEFT HEART CATH AND CORONARY ANGIOGRAPHY;  Surgeon: Adrian Prows, MD;  Location: [facility];  Service: Cardiovascular;  Laterality: N/A;   Social History:  Social History   Socioeconomic History  . Marital status: Single    Spouse name: Not on file  . Number of children: 2  . Years of education: Not on file  . Highest education level: Not on file  Occupational History    Employer: [employer]  Tobacco Use  .  Smoking status: Current Every Day Smoker    Packs/day: 1.00    Years: 33.00    Pack years: 33.00    Types: Cigarettes  . Smokeless tobacco: Never Used  . Tobacco comment: Trying to quit. Down to 1 ppd, 10/11/13 down to 1/2 ppd  Vaping Use  . Vaping Use: Never used  Substance and Sexual Activity  . Alcohol use: Not Currently    Alcohol/week: 0.0 standard drinks  . Drug use: Yes    Types: Marijuana    Comment: Marijuana daily  . Sexual activity: Not on file  Other Topics Concern  . Not on file  Social History Narrative   [place]. He does not work now.    Right handed      Highest level of edu- 11th grade      Lives in one story home. Steps to enter home   Social Determinants of Health   Financial Resource Strain: Not on file  Food Insecurity: Not on file  Transportation Needs: Not on file  Physical Activity: Not on file  Stress: Not on file  Social Connections: Not on file  Intimate Partner Violence: Not on file   Family History:   Family History  Problem Relation Age of Onset  . Breast cancer Mother        dx in her 53s-60s  . Bone cancer Mother   . Diabetes Father   . Heart disease Father   . Diabetes Brother   . Diabetes Paternal Uncle     Review of Systems: Constitutional: Denies fevers, chills or abnormal weight loss Eyes: Denies blurriness of vision Ears, nose, mouth, throat, and face: Denies mucositis or sore throat Respiratory: Denies cough, dyspnea or wheezes Cardiovascular: Denies palpitation, chest discomfort or lower extremity swelling Gastrointestinal:  Denies nausea, constipation, diarrhea GU: Denies dysuria or incontinence Skin: Denies abnormal skin rashes Neurological: Per HPI Musculoskeletal: Denies joint pain, back or neck discomfort. No decrease in ROM Behavioral/Psych: Denies anxiety, disturbance in thought content, and mood instability  Physical Exam: Vitals:   08/05/20 0912  BP: 116/77  Pulse: 78  Resp: 18  Temp: (!) 97 F (36.1 C)  SpO2: 99%   KPS: 90. General: Alert, cooperative, pleasant, in no acute distress Head: Normal EENT: No conjunctival injection or scleral icterus. Oral mucosa moist Lungs: Resp effort normal Cardiac: Regular rate and rhythm Abdomen: Soft, non-distended abdomen Skin: No rashes cyanosis or petechiae. Extremities: No clubbing or edema  Neurologic Exam: Mental Status: Awake, alert, attentive to examiner. Oriented to self and environment. Language is fluent with intact comprehension.  Cranial Nerves: Visual acuity is grossly normal. Visual fields are full. Extra-ocular movements intact. No ptosis. Face is symmetric, tongue midline. Motor: Tone and bulk are normal. Power is full in both arms and legs. Reflexes are symmetric, no pathologic reflexes present. Intact finger to nose bilaterally Sensory: Intact to light touch and temperature Gait: Normal and tandem gait is normal.   Labs: I have reviewed the data as listed    Component Value  Date/Time   NA 138 07/24/2019 1034   NA 139 01/16/2014 1035   K 4.4 07/24/2019 1034   K 4.1 01/16/2014 1035   CL 100 07/24/2019 1034   CO2 22 07/24/2019 1034   CO2 24 01/16/2014 1035   GLUCOSE 189 (H) 07/24/2019 1034   GLUCOSE 156 (H) 07/16/2019 0228   GLUCOSE 188 (H) 01/16/2014 1035   BUN 12 07/24/2019 1034   BUN 11.6 01/16/2014 1035  CREATININE 0.71 (L) 07/24/2019 1034   CREATININE 1.1 01/16/2014 1035   CALCIUM 9.4 07/24/2019 1034   CALCIUM 9.5 01/16/2014 1035   PROT 7.1 11/16/2013 0921   ALBUMIN 3.9 11/16/2013 0921   AST 23 11/16/2013 0921   ALT 35 11/16/2013 0921   ALKPHOS 83 11/16/2013 0921   BILITOT 0.27 11/16/2013 0921   GFRNONAA 108 07/24/2019 1034   GFRAA 125 07/24/2019 1034   Lab Results  Component Value Date   WBC 21.8 (H) 07/16/2019   NEUTROABS 5.7 11/16/2013   HGB 14.5 07/16/2019   HCT 42.4 07/16/2019   MCV 89.5 07/16/2019   PLT 299 07/16/2019    Imaging: [facility] Clinician Interpretation: I have personally reviewed the CNS images as listed.  My interpretation, in the context of the patient's clinical presentation, is stable disease  MR BRAIN W WO CONTRAST  Result Date: 08/01/2020 CLINICAL DATA:  Brain/CNS neoplasm, assess treatment response. EXAM: MRI HEAD WITHOUT AND WITH CONTRAST TECHNIQUE: Multiplanar, multiecho pulse sequences of the brain and surrounding structures were obtained without and with intravenous contrast. CONTRAST:  39m MULTIHANCE GADOBENATE DIMEGLUMINE 529 MG/ML IV SOLN COMPARISON:  MRI 02/02/2020. FINDINGS: Brain: Similar post biopsy changes are noted in the right frontal lobe with similar cystic encephalomalacia. Similar surrounding T2/FLAIR hyperintensity in the white matter without mass effect. The more medial/inferior area of enhancement along the right frontal lobe is unchanged in size/appearance (series 17, image 98). The more superior/lateral focus of enhancement in the right frontal lobe is slightly decreased in conspicuity (series 17,  image 110). 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 Similar developmental venous anomaly in the left frontal lobe. No acute infarct. No acute hemorrhage. No hydrocephalus. No midline shift. Basal cisterns are patent. No extra-axial fluid collection. Vascular: Major arterial flow voids are maintained at the skull base. Skull and upper cervical spine: Normal marrow signal. Right frontal craniotomy. Sinuses/Orbits: Mild ethmoid air cell and left sphenoid sinus mucosal thickening without air-fluid levels. Unremarkable orbits. Other: Small left mastoid effusion. IMPRESSION: The two foci of enhancement in the right frontal lobe are similar versus slightly decreased, as detailed above. No new areas of enhancement identified. Stable post treatment change. Electronically Signed   By: FMargaretha SheffieldMD   On: 08/01/2020 09:07      Assessment/Plan 1. Ast[place] of [place] ([place]   CTrevor Ihais clinically stable today.  MRI shows improvement or stability of two enhancing foci within bed of T2 signal abnormality.  Again, these foci likely represent radioinflammatory changes.  Otherwise no concerning clinical changes today, we counseled him on food choices and advised reduction of sugar intake.   We ask that CTrevor Ihareturn to clinic in 9 months following next brain MRI, or sooner as needed.  Will con't to follow with Dr. ADelice Leschfor seizures.   All questions were answered. The patient knows to call the clinic with any problems, questions or concerns. No barriers to learning were detected.  I have spent a total of 30 minutes of face-to-face and non-face-to-face time, excluding clinical staff time, preparing to see patient, ordering tests and/or medications, counseling the patient, and independently interpreting results and communicating results to the patient/family/caregiver    ZVentura Sellers MD Medical  Director of Neuro-Oncology [facility]at [facility]04/05/22 9:11 AM

## 2020-08-07 ENCOUNTER — Other Ambulatory Visit (HOSPITAL_COMMUNITY): Payer: Self-pay

## 2020-08-18 ENCOUNTER — Other Ambulatory Visit: Payer: Medicare Other

## 2020-08-19 ENCOUNTER — Other Ambulatory Visit: Payer: Self-pay | Admitting: Urology

## 2020-08-19 ENCOUNTER — Other Ambulatory Visit (HOSPITAL_COMMUNITY): Payer: Self-pay

## 2020-08-19 ENCOUNTER — Telehealth: Payer: Self-pay | Admitting: *Deleted

## 2020-08-19 DIAGNOSIS — R519 Headache, unspecified: Secondary | ICD-10-CM

## 2020-08-19 DIAGNOSIS — C711 Malignant neoplasm of frontal lobe: Secondary | ICD-10-CM

## 2020-08-19 DIAGNOSIS — G8929 Other chronic pain: Secondary | ICD-10-CM

## 2020-08-19 MED ORDER — OXYCODONE-ACETAMINOPHEN 5-325 MG PO TABS
1.0000 | ORAL_TABLET | Freq: Four times a day (QID) | ORAL | 0 refills | Status: DC | PRN
  Filled 2020-08-21: qty 90, 23d supply, fill #0

## 2020-08-19 NOTE — Telephone Encounter (Signed)
Called patient to let him know that script will be ready for pick-up on 08-20-20 @ [facility], spoke with patient and he is aware of this.

## 2020-08-21 ENCOUNTER — Other Ambulatory Visit (HOSPITAL_COMMUNITY): Payer: Self-pay

## 2020-08-26 ENCOUNTER — Other Ambulatory Visit: Payer: Self-pay

## 2020-08-26 ENCOUNTER — Ambulatory Visit: Payer: Medicare Other

## 2020-08-26 DIAGNOSIS — I2510 Atherosclerotic heart disease of native coronary artery without angina pectoris: Secondary | ICD-10-CM | POA: Diagnosis not present

## 2020-08-26 DIAGNOSIS — Z95820 Peripheral vascular angioplasty status with implants and grafts: Secondary | ICD-10-CM | POA: Diagnosis not present

## 2020-08-27 ENCOUNTER — Other Ambulatory Visit (HOSPITAL_COMMUNITY): Payer: Self-pay

## 2020-08-27 ENCOUNTER — Other Ambulatory Visit: Payer: Self-pay | Admitting: Cardiology

## 2020-08-27 MED ORDER — ATORVASTATIN CALCIUM 80 MG PO TABS
ORAL_TABLET | Freq: Every day | ORAL | 2 refills | Status: DC
  Filled 2020-08-27: qty 90, 90d supply, fill #0
  Filled 2020-10-30: qty 90, 90d supply, fill #1
  Filled 2021-05-14: qty 90, 90d supply, fill #2

## 2020-08-27 MED FILL — Phenytoin Sodium Extended Cap 100 MG: ORAL | 30 days supply | Qty: 90 | Fill #0 | Status: AC

## 2020-08-28 ENCOUNTER — Other Ambulatory Visit (HOSPITAL_COMMUNITY): Payer: Self-pay

## 2020-08-29 ENCOUNTER — Other Ambulatory Visit (HOSPITAL_COMMUNITY): Payer: Self-pay

## 2020-09-02 ENCOUNTER — Other Ambulatory Visit: Payer: Self-pay | Admitting: Radiation Therapy

## 2020-09-08 ENCOUNTER — Other Ambulatory Visit: Payer: Self-pay

## 2020-09-08 ENCOUNTER — Other Ambulatory Visit (HOSPITAL_COMMUNITY): Payer: Self-pay

## 2020-09-08 ENCOUNTER — Ambulatory Visit: Payer: Medicare Other | Admitting: Cardiology

## 2020-09-08 ENCOUNTER — Encounter: Payer: Self-pay | Admitting: Cardiology

## 2020-09-08 VITALS — BP 115/80 | HR 83 | Temp 98.6°F | Ht 72.0 in | Wt 173.0 lb

## 2020-09-08 DIAGNOSIS — I4729 Other ventricular tachycardia: Secondary | ICD-10-CM

## 2020-09-08 DIAGNOSIS — I214 Non-ST elevation (NSTEMI) myocardial infarction: Secondary | ICD-10-CM

## 2020-09-08 DIAGNOSIS — I472 Ventricular tachycardia: Secondary | ICD-10-CM

## 2020-09-08 DIAGNOSIS — E782 Mixed hyperlipidemia: Secondary | ICD-10-CM

## 2020-09-08 DIAGNOSIS — I2510 Atherosclerotic heart disease of native coronary artery without angina pectoris: Secondary | ICD-10-CM | POA: Diagnosis not present

## 2020-09-08 DIAGNOSIS — Z794 Long term (current) use of insulin: Secondary | ICD-10-CM

## 2020-09-08 DIAGNOSIS — C711 Malignant neoplasm of frontal lobe: Secondary | ICD-10-CM

## 2020-09-08 DIAGNOSIS — Z95820 Peripheral vascular angioplasty status with implants and grafts: Secondary | ICD-10-CM

## 2020-09-08 DIAGNOSIS — I255 Ischemic cardiomyopathy: Secondary | ICD-10-CM | POA: Diagnosis not present

## 2020-09-08 DIAGNOSIS — E1159 Type 2 diabetes mellitus with other circulatory complications: Secondary | ICD-10-CM

## 2020-09-08 DIAGNOSIS — Z720 Tobacco use: Secondary | ICD-10-CM

## 2020-09-08 NOTE — Progress Notes (Signed)
Jonathan Barker Date of Birth: 10-03-1965 MRN: [id number] Primary Care Provider:Guilloud, Hoyle Sauer, MD Former Cardiology Providers: Dr. Adrian Prows Primary Cardiologist: Rex Kras, DO (March 2021)  Date: 09/08/20 Last Office Visit: 02/11/2020  Chief Complaint  Patient presents with  . Coronary Artery Disease  . Follow-up    7 month  . Results  . Cardiomyopathy   HPI  Jonathan Barker is a 55 y.o. male who presents to the office with a chief complaint of " 30-monthfollow-up for CAD management." Patient's past medical history and cardiac risk factors include: Recent non-STEMI, established coronary artery disease with PCI, ischemic cardiomyopathy, hypertension, hyperlipidemia, insulin-dependent diabetes mellitus type 2, history of nonsustained ventricular tachycardia, active tobacco use, history of astrocytoma.  In March 2021 he presented to the hospital with chest pain but did not meet STEMI criteria but due to ongoing chest pain and elevated cardiac biomarkers underwent left heart catheterization urgently.  He was found to have obstructive CAD and successfully underwent PTCA and stenting as outlined below.  Patient has been following up regularly as recommended for further up titration of GDMT and improving his modifiable cardiovascular risk factors.  He now presents for 613-monthollow-up.  He is doing well from a cardiovascular standpoint no recent hospitalizations or urgent care visits.  He denies any anginal chest pain or shortness of breath.  He recently walked 1 mile marathon and remained asymptomatic.  No recent use of sublingual nitroglycerin tablets. Unfortunately, he continues to smoke 1 pack/day. Patient states that his sugars are probably not well controlled at his sugar intake is significantly high and [facility] regular basis more than 1 bottle a day. Clinically patient states that his functional status remains stable.  He walks regularly and golfs 3  times a week.  ALLERGIES: No Known Allergies   MEDICATION LIST PRIOR TO VISIT: Current Outpatient Medications on File Prior to Visit  Medication Sig Dispense Refill  . aspirin 81 MG chewable tablet Chew 1 tablet (81 mg total) by mouth daily. 90 tablet 3  . atorvastatin (LIPITOR) 80 MG tablet TAKE 1 TABLET (80 MG TOTAL) BY MOUTH DAILY AT 6 PM. 90 tablet 2  . blood glucose meter kit and supplies KIT Dispense based on patient and insurance preference. Use up to four times daily as directed. (FOR ICD-9 250.00, 250.01). 1 each 0  . Blood Pressure Monitoring (BLOOD PRESSURE CUFF) MISC 1 each by Does not apply route daily as needed. 1 each 0  . clopidogrel (PLAVIX) 75 MG tablet TAKE 1 TABLET BY MOUTH ONCE A DAY (NEEDS APPT) 90 tablet 0  . Empagliflozin-metFORMIN HCl 12.08-998 MG TABS TAKE 1 TABLET BY MOUTH TWICE DAILY 180 tablet 0  . Insulin Glargine (BASAGLAR KWIKPEN) 100 UNIT/ML Inject 0.12 mLs (12 Units total) into the skin daily. (Patient taking differently: Inject 10 Units into the skin daily.) 15 mL 0  . Insulin Pen Needle (PEN NEEDLES) 32G X 4 MM MISC 1 pen by Does not apply route as directed. 100 each 11  . lisinopril (ZESTRIL) 5 MG tablet TAKE 1 TABLET BY MOUTH ONCE A DAY (NEEDS APPT) 90 tablet 0  . magnesium oxide (MAG-OX) 400 (241.3 Mg) MG tablet Take 0.5 tablets (200 mg total) by mouth 2 (two) times daily. 90 tablet 1  . metoprolol tartrate (LOPRESSOR) 50 MG tablet Take 50 mg by mouth 2 (two) times daily.    . nitroGLYCERIN (NITROSTAT) 0.4 MG SL tablet Place 1 tablet (0.4 mg total) under the tongue  every 5 (five) minutes x 3 doses as needed for chest pain. 90 tablet 3  . oxyCODONE-acetaminophen (PERCOCET/ROXICET) 5-325 MG tablet Take by mouth every 6 (six) hours as needed for severe pain.    . phenytoin (DILANTIN) 100 MG ER capsule TAKE 3 CAPSULES BY MOUTH EVERY MORNING 90 capsule 6  . gabapentin (NEURONTIN) 300 MG capsule TAKE 1 CAPSULE BY MOUTH AT BEDTIME 90 capsule 3   No current  facility-administered medications on file prior to visit.    PAST MEDICAL HISTORY: Past Medical History:  Diagnosis Date  . ACS (acute coronary syndrome) ([place])   . Astrocytoma of frontal lobe ([place]) 05/31/2013  . Coronary artery disease   . DM (diabetes mellitus) ([place]) 08/09/2011   A1c at diagnosis = 12.4  . Family history of breast cancer   . GERD (gastroesophageal reflux disease) 08/09/2011  . H/O heart artery stent 07/14/2018  . Hyperlipidemia   . LAD stenosis   . Non-ST elevation (NSTEMI) myocardial infarction ([place]) 07/15/2019  . Nonsustained ventricular tachycardia ([place])   . NSVT (nonsustained ventricular tachycardia) ([place])   . Seizure ([place]) 05/19/2013  . Shortness of breath     PAST SURGICAL HISTORY: Past Surgical History:  Procedure Laterality Date  . APPENDECTOMY    . BRAIN BIOPSY N/A 05/24/2013   Procedure: Craniotomy for open biopsy;  Surgeon: Winfield Cunas, MD;  Location: [facility];  Service: Neurosurgery;  Laterality: N/A;  Craniotomy for open biopsy  . CARDIAC CATHETERIZATION    . CORONARY ANGIOPLASTY WITH STENT PLACEMENT    . CORONARY/GRAFT ACUTE MI REVASCULARIZATION N/A 07/15/2019   Procedure: Coronary/Graft Acute MI Revascularization;  Surgeon: Adrian Prows, MD;  Location: [facility] [place];  Service: Cardiovascular;  Laterality: N/A;  . LEFT HEART CATH AND CORONARY ANGIOGRAPHY N/A 07/15/2019   Procedure: LEFT HEART CATH AND CORONARY ANGIOGRAPHY;  Surgeon: Adrian Prows, MD;  Location: [facility];  Service: Cardiovascular;  Laterality: N/A;    FAMILY HISTORY: The patient family history includes Bone cancer in his mother; Breast cancer in his mother; Diabetes in his brother, father, and paternal uncle; Heart disease in his father.   SOCIAL HISTORY:  The patient  reports that he has been smoking cigarettes. He has a 33.00 pack-year smoking history. He has never used smokeless tobacco. He reports previous alcohol use. He reports current drug use. Drug:  Marijuana.  Review of Systems  Constitutional: Negative for chills and fever.  HENT: Negative for ear discharge, ear pain and nosebleeds.   Eyes: Negative for blurred vision and discharge.  Cardiovascular: Negative for chest pain, claudication, dyspnea on exertion, leg swelling, near-syncope, orthopnea, palpitations, paroxysmal nocturnal dyspnea and syncope.  Respiratory: Negative for cough and shortness of breath.   Endocrine: Negative for polydipsia, polyphagia and polyuria.  Hematologic/Lymphatic: Negative for bleeding problem.  Skin: Negative for flushing and nail changes.  Musculoskeletal: Negative for muscle cramps, muscle weakness and myalgias.  Gastrointestinal: Negative for abdominal pain, dysphagia, hematemesis, hematochezia, melena, nausea and vomiting.  Neurological: Negative for dizziness, focal weakness and light-headedness.   PHYSICAL EXAM: Vitals with BMI 09/08/2020 08/05/2020 02/11/2020  Height 6' 0"  - 6' 0"   Weight 173 lbs 175 lbs 3 oz 170 lbs  BMI 14.43 - 15.40  Systolic 086 761 950  Diastolic 80 77 71  Pulse 83 78 83   Orthostatic VS for the past 72 hrs (Last 3 readings):  Patient Position BP Location Cuff Size  09/08/20 1025 Sitting Left Arm Normal   Constitutional: He isoriented to  person, place, and time. He appearswell-developedand well-nourished.No distress.  HENT:  Head:Normocephalicand atraumatic.  Eyes: Right eye exhibitsno discharge. Left eye exhibitsno discharge.No scleral icterus.  Neck:No JVDpresent. No tracheal deviationpresent.  Cardiovascular:Normal rateand regular rhythm. Exam revealsno friction rub. No murmurheard. Pulmonary/Chest:Breath sounds normal. Norespiratory distress. He hasno wheezes. He hasno rales.  Abdominal:Soft.Bowel sounds are normal. He exhibitsno distension. There isno abdominal tenderness. There isno reboundand no guarding.  Musculoskeletal:  General: No edema.Normal range of motion.   Cervical back: Normal range of motionand neck supple.  Neurological: He is alertand oriented to person, place, and time. Nocranial nerve deficit.  Skin: Skin isdry.No rashnoted. He is not diaphoretic. Noerythema.  Psychiatric: He has anormal mood and affect. Hisbehavior is normal.Judgmentand thought contentnormal.   Cardiac Studies: EKG  09/08/2020: NSR, 83bpm, left axis, left anterior fascicular block, old anteroseptal infarct, TWI high lateral leads consider ischemia, without underlying injury pattern. No significant change compared to prior EKG 02/11/2020.  Echocardiogram: 07/15/2019: LVEF 30-35%, moderate to severely decreased function, with regional wall motion abnormalities, grade 1 diastolic impairment, no mural thrombus, no pulmonary hypertension.  08/26/2020:  Left ventricle cavity is normal in size. Mild concentric hypertrophy of the left ventricle. Mid to distal anteroseptal and apical severe hypokinesis. LVEF 40-45%. Grade 1 diastolic dysfunction, normal LAP.  Mild (Grade I) mitral regurgitation.  No evidence of pulmonary hypertension.  No significant change compared to previous study on 09/12/2019.   Left Heart Catheterization 07/15/19: LV: Mid to distal anterior, anterolateral and apical akinesis, EF 35 to at most 40%. Hand contrast injection, not well visualized. Normal LVEDP. RCA: Dominant, mild diffuse disease. Left main: Mildly calcified. LAD: Diffusely diseased proximal segment, mid segment had a 90% stenosis. Mid to distal segment has minimal disease and apical LAD had 99% stenosis. Successful PTCA and stenting of the proximal/ostial and mid LAD with implantation of a long 3.0 x 38 mm resolute Onyx DES postdilated with 3.5 x 12 mm noncompliant sapphire balloon. 90% stenosis reduced to 0% with TIMI-3 to TIMI-3 flow. Apical LAD stenosis left alone. Ramus intermediate: Large vessel, ostium has ulcerated appearing 99% stenosis. Upon angioplasty realized  it was also calcified and probably chronic. Successful PTCA and stenting with 2 overlapping 2.5 x 15 and distally 2.5 x 13 mm resolute Onyx DES. 9 9% stenosis reduced to 0% with TIMI-3 to TIMI-3 flow. Circumflex: Moderate to large vessel, proximal to mid segment is diffusely diseased 30%. Large OM1 with proximal long 60% to at most 70% stenosis.  48 hour Holter monitor: Dominant rhythm normal sinus, followed by tachycardia (27% burden). Heart rate 84-125 bpm.  Average heart rate 97 bpm. No atrial fibrillation and no sinus pause greater than or equal to 2.5 seconds in duration. No episodes of nonsustained ventricular tachycardia. 7 isolated ventricular ectopic beats, 0 ventricular pairs and 0 ventricular runs, total ventricular ectopic burden <0.01%. 11 isolated supraventricular ectopic beats, 0 supraventricular runs, total supraventricular ectopic burden 0.01%. No patient diary submitted as a part of the study.  LABORATORY DATA: CBC Latest Ref Rng & Units 07/16/2019 07/15/2019 07/14/2019  WBC 4.0 - 10.5 K/uL 21.8(H) 20.2(H) 17.0(H)  Hemoglobin 13.0 - 17.0 g/dL 14.5 17.1(H) 18.1(H)  Hematocrit 39.0 - 52.0 % 42.4 49.7 54.0(H)  Platelets 150 - 400 K/uL 299 330 345    CMP Latest Ref Rng & Units 07/24/2019 07/16/2019 07/15/2019  Glucose 65 - 99 mg/dL 189(H) 156(H) 184(H)  BUN 6 - 24 mg/dL 12 36(H) 10  Creatinine 0.76 - 1.27 mg/dL 0.71(L) 0.67 0.75  Sodium  134 - 144 mmol/L 138 141 139  Potassium 3.5 - 5.2 mmol/L 4.4 3.8 3.7  Chloride 96 - 106 mmol/L 100 112(H) 103  CO2 20 - 29 mmol/L 22 18(L) 24  Calcium 8.7 - 10.2 mg/dL 9.4 9.1 9.1  Total Protein 6.4 - 8.3 g/dL - - -  Total Bilirubin 0.20 - 1.20 mg/dL - - -  Alkaline Phos 40 - 150 U/L - - -  AST 5 - 34 U/L - - -  ALT 0 - 55 U/L - - -    Lipid Panel     Component Value Date/Time   CHOL 111 09/12/2019 0804   TRIG 105 09/12/2019 0804   HDL 36 (L) 09/12/2019 0804   CHOLHDL 5.7 07/14/2019 2336   VLDL 25 07/14/2019 2336   LDLCALC 55  09/12/2019 0804   LABVLDL 20 09/12/2019 0804    Lab Results  Component Value Date   HGBA1C 10.6 (H) 07/14/2019   HGBA1C 10.7 (A) 03/27/2018   HGBA1C 9.4 06/27/2017   No components found for: NTPROBNP Lab Results  Component Value Date   TSH 1.073 07/18/2011    FINAL MEDICATION LIST END OF ENCOUNTER: No orders of the defined types were placed in this encounter.   Medications Discontinued During This Encounter  Medication Reason  . metoprolol succinate (TOPROL-XL) 100 MG 24 hr tablet Patient Preference  . oxyCODONE-acetaminophen (PERCOCET/ROXICET) 5-325 MG tablet Error     Current Outpatient Medications:  .  aspirin 81 MG chewable tablet, Chew 1 tablet (81 mg total) by mouth daily., Disp: 90 tablet, Rfl: 3 .  atorvastatin (LIPITOR) 80 MG tablet, TAKE 1 TABLET (80 MG TOTAL) BY MOUTH DAILY AT 6 PM., Disp: 90 tablet, Rfl: 2 .  blood glucose meter kit and supplies KIT, Dispense based on patient and insurance preference. Use up to four times daily as directed. (FOR ICD-9 250.00, 250.01)., Disp: 1 each, Rfl: 0 .  Blood Pressure Monitoring (BLOOD PRESSURE CUFF) MISC, 1 each by Does not apply route daily as needed., Disp: 1 each, Rfl: 0 .  clopidogrel (PLAVIX) 75 MG tablet, TAKE 1 TABLET BY MOUTH ONCE A DAY (NEEDS APPT), Disp: 90 tablet, Rfl: 0 .  Empagliflozin-metFORMIN HCl 12.08-998 MG TABS, TAKE 1 TABLET BY MOUTH TWICE DAILY, Disp: 180 tablet, Rfl: 0 .  Insulin Glargine (BASAGLAR KWIKPEN) 100 UNIT/ML, Inject 0.12 mLs (12 Units total) into the skin daily. (Patient taking differently: Inject 10 Units into the skin daily.), Disp: 15 mL, Rfl: 0 .  Insulin Pen Needle (PEN NEEDLES) 32G X 4 MM MISC, 1 pen by Does not apply route as directed., Disp: 100 each, Rfl: 11 .  lisinopril (ZESTRIL) 5 MG tablet, TAKE 1 TABLET BY MOUTH ONCE A DAY (NEEDS APPT), Disp: 90 tablet, Rfl: 0 .  magnesium oxide (MAG-OX) 400 (241.3 Mg) MG tablet, Take 0.5 tablets (200 mg total) by mouth 2 (two) times daily., Disp: 90  tablet, Rfl: 1 .  metoprolol tartrate (LOPRESSOR) 50 MG tablet, Take 50 mg by mouth 2 (two) times daily., Disp: , Rfl:  .  nitroGLYCERIN (NITROSTAT) 0.4 MG SL tablet, Place 1 tablet (0.4 mg total) under the tongue every 5 (five) minutes x 3 doses as needed for chest pain., Disp: 90 tablet, Rfl: 3 .  oxyCODONE-acetaminophen (PERCOCET/ROXICET) 5-325 MG tablet, Take by mouth every 6 (six) hours as needed for severe pain., Disp: , Rfl:  .  phenytoin (DILANTIN) 100 MG ER capsule, TAKE 3 CAPSULES BY MOUTH EVERY MORNING, Disp: 90 capsule, Rfl: 6 .  gabapentin (NEURONTIN) 300 MG capsule, TAKE 1 CAPSULE BY MOUTH AT BEDTIME, Disp: 90 capsule, Rfl: 3  IMPRESSION:    ICD-10-CM   1. Coronary artery disease involving native coronary artery of native heart without angina pectoris  I25.10 EKG 12-Lead    CMP14+EGFR    Lipid Panel With LDL/HDL Ratio    LDL cholesterol, direct  2. S/P angioplasty with stent  Z95.820   3. Ischemic cardiomyopathy  I25.5 CMP14+EGFR    Lipid Panel With LDL/HDL Ratio    LDL cholesterol, direct  4. Mixed hyperlipidemia  E78.2 CMP14+EGFR    Lipid Panel With LDL/HDL Ratio    LDL cholesterol, direct  5. Tobacco use  Z72.0   6. Non-ST elevation (NSTEMI) myocardial infarction ([place])  I21.4   7. Nonsustained ventricular tachycardia (HCC)  I47.2   8. Type 2 diabetes mellitus with other circulatory complication, with long-term current use of insulin (HCC)  E11.59    Z79.4   9. Long-term insulin use ([facility])  Z79.4   10. Astrocytoma of frontal lobe (HCC)  C71.1      RECOMMENDATIONS: KENTARIUS PARTINGTON is a 55 y.o. male whose past medical history and cardiac risk factors include: History of astrocytoma, non-STEMI, nonsustained ventricular tachycardia, established coronary artery disease a status post angioplasty and stenting, ischemic cardiomyopathy, uncontrolled diabetes mellitus type 2, hyperlipidemia, tobacco use.  Established coronary artery disease status post stenting:  We  discussed stopping Plavix since his stents were placed in March 2021.  However, given the number of stents, multiple cardiovascular risk factors including smoking and uncontrolled diabetes the shared decision was to continue dual antiplatelet therapy as he remains a low risk for bleeding.    Denies any recent anginal discomfort.  No use of sublingual nitroglycerin tablets.  Repeat echocardiogram results reviewed.  LVEF remains relatively stable and continues to have similar RWMA.  Patient prefers to be on metoprolol tartrate as opposed to metoprolol succinate.  Educated on the importance of complete smoking cessation.  We will recheck fasting lipid profile.  Ischemic cardiomyopathy: Chronic and stable.  Medications reconciled.  See above.  History of nonsustained ventricular tachycardia: Continue beta-blocker therapy.  No recurrence on repeat Holter and beta blocker tx.   Active tobacco use:  Tobacco cessation counseling:  Currently smoking 1 packs/day    Patient was informed of the dangers of tobacco abuse including stroke, cancer, and MI, as well as benefits of tobacco cessation.  Patient is willing to quit at this time.  Approximately 5 mins were spent counseling patient cessation techniques. We discussed various methods to help quit smoking, including deciding on a date to quit, joining a support group, pharmacological agents- nicotine gum/patch/lozenges, chantix.   I will reassess his progress at the next follow-up visit  Insulin-dependent diabetes mellitus type 2: Currently managed by primary care provider.  Educated on importance of glycemic control given his underlying CAD.  Recommended that he follows with his PCP for reevaluation of his A1c and diabetic medication management.  Mixed hyperlipidemia:  Currently on high intensity statin therapy after his non-STEMI.  Check fasting lipid profile and a CMP to evaluate liver function.  --Continue cardiac medications as  reconciled in final medication list. --Return in about 1 year (around 09/08/2021) for Follow up, CAD. Or sooner if needed. --Continue follow-up with your primary care physician regarding the management of your other chronic comorbid conditions.  Patient's questions and concerns were addressed to his satisfaction. He voices understanding of the instructions provided during this encounter.   This  note was created using a voice recognition software as a result there may be grammatical errors inadvertently enclosed that do not reflect the nature of this encounter. Every attempt is made to correct such errors.  Rex Kras, [place], [facility]  Pager: [phone] Office: [phone]

## 2020-09-10 ENCOUNTER — Encounter: Payer: Medicare Other | Admitting: Internal Medicine

## 2020-09-12 ENCOUNTER — Other Ambulatory Visit: Payer: Self-pay

## 2020-09-12 DIAGNOSIS — E782 Mixed hyperlipidemia: Secondary | ICD-10-CM

## 2020-09-12 DIAGNOSIS — I255 Ischemic cardiomyopathy: Secondary | ICD-10-CM

## 2020-09-12 DIAGNOSIS — I2510 Atherosclerotic heart disease of native coronary artery without angina pectoris: Secondary | ICD-10-CM

## 2020-09-24 ENCOUNTER — Other Ambulatory Visit (HOSPITAL_COMMUNITY): Payer: Self-pay

## 2020-09-24 ENCOUNTER — Encounter: Payer: Self-pay | Admitting: Internal Medicine

## 2020-09-24 ENCOUNTER — Other Ambulatory Visit: Payer: Self-pay

## 2020-09-24 ENCOUNTER — Ambulatory Visit (INDEPENDENT_AMBULATORY_CARE_PROVIDER_SITE_OTHER): Payer: Medicare Other | Admitting: Internal Medicine

## 2020-09-24 VITALS — BP 106/71 | HR 76 | Temp 97.8°F | Ht 71.0 in | Wt 174.3 lb

## 2020-09-24 DIAGNOSIS — E119 Type 2 diabetes mellitus without complications: Secondary | ICD-10-CM

## 2020-09-24 DIAGNOSIS — I739 Peripheral vascular disease, unspecified: Secondary | ICD-10-CM

## 2020-09-24 DIAGNOSIS — Z794 Long term (current) use of insulin: Secondary | ICD-10-CM

## 2020-09-24 DIAGNOSIS — C711 Malignant neoplasm of frontal lobe: Secondary | ICD-10-CM | POA: Diagnosis not present

## 2020-09-24 DIAGNOSIS — I2510 Atherosclerotic heart disease of native coronary artery without angina pectoris: Secondary | ICD-10-CM

## 2020-09-24 DIAGNOSIS — Z1211 Encounter for screening for malignant neoplasm of colon: Secondary | ICD-10-CM

## 2020-09-24 DIAGNOSIS — E782 Mixed hyperlipidemia: Secondary | ICD-10-CM

## 2020-09-24 DIAGNOSIS — E1159 Type 2 diabetes mellitus with other circulatory complications: Secondary | ICD-10-CM | POA: Diagnosis not present

## 2020-09-24 HISTORY — DX: Peripheral vascular disease, unspecified: I73.9

## 2020-09-24 HISTORY — DX: Encounter for screening for malignant neoplasm of colon: Z12.11

## 2020-09-24 LAB — POCT GLYCOSYLATED HEMOGLOBIN (HGB A1C): Hemoglobin A1C: 8.2 % — AB (ref 4.0–5.6)

## 2020-09-24 LAB — GLUCOSE, CAPILLARY: Glucose-Capillary: 196 mg/dL — ABNORMAL HIGH (ref 70–99)

## 2020-09-24 MED ORDER — EMPAGLIFLOZIN-METFORMIN HCL 12.5-1000 MG PO TABS
1.0000 | ORAL_TABLET | Freq: Two times a day (BID) | ORAL | 3 refills | Status: DC
  Filled 2020-09-24 – 2021-07-27 (×2): qty 180, 90d supply, fill #0

## 2020-09-24 MED ORDER — TRULICITY 0.75 MG/0.5ML ~~LOC~~ SOAJ
0.7500 mg | SUBCUTANEOUS | 2 refills | Status: DC
  Filled 2020-09-24: qty 2, 28d supply, fill #0

## 2020-09-24 NOTE — Assessment & Plan Note (Signed)
Patient is complaining of bilateral foot pain previously attributed to neuropathy.  He has chronic uncontrolled diabetes and takes Dilantin for his seizure disorder, both of which can cause peripheral neuropathy.  However today he tells me that he does not have foot pain at rest.  His feet hurt mostly at the end of the day when his feet are elevated and when he is laying in bed.  He also has pain after walking for long distances. He denies calf pain, and says the pain is limited to his feet.  On exam he has some hair loss on his bilateral shins and dorsal aspects of his feet. On exam I can appreciate 2+ pulses of his left dorsalis pedis and posterior tibial arteries.  However on the right, I am not able to appreciate a posterior tibial pulse, and his dorsalis pedis is only faintly palpable.  His feet are warm and there are no signs of acute foot ischemia.  I recommended ABIs for further work-up, he is agreeable. -- Follow up ABIs  -- Already taking ASA / Plavix and atorvastatin for his CAD. Continue this.

## 2020-09-24 NOTE — Assessment & Plan Note (Signed)
Lipid panel recently checked at cardiology visit earlier this month.  LDL was 55, at goal.  Continue atorvastatin 80 mg daily for secondary prevention with history of ischemic heart disease.

## 2020-09-24 NOTE — Assessment & Plan Note (Signed)
Follows regularly with neuro-oncology at [facility].  Was recently seen on 08/05/2020.  On review of notes, it seems that his cancer is in remission.  Surveillance MRI on 08/01/2020 showed 2 stable enhancing foci in the right frontal lobe felt to be radio inflammatory changes from his treatment.  No new lesions were noted.  He denies any new seizure activity.  He is compliant with his Dilantin 100 mg ER daily.

## 2020-09-24 NOTE — Assessment & Plan Note (Addendum)
Chronic and uncontrolled.  Hemoglobin A1c today is 8.2, down from 10.6 one year ago.  He is currently taking combination empagliflozin-metformin 12.08-998 mg twice daily.  He admits to noncompliance with his insulin glargine.  He has been more active recently, he is now golfing 3 times weekly and walking the course.  Since he has not been taking his insulin, we will stop this for now.  He is agreeable to trying once weekly Trulicity, and feels this is more feasible than a daily injection. -- Continue empagliflozin-metformin -- Start Trulicity 3.81 mg subcutaneous, once weekly.  Instructed to call if this is not affordable for him. -- Follow-up 3 months for repeat hemoglobin A1c.  Uptitrate Trulicity as tolerated.

## 2020-09-24 NOTE — Assessment & Plan Note (Signed)
Patient has a history of NSTEMI and CAD status post left heart cath in March 2021 revealing multivessel disease requiring PCI with balloon dilation and multiple DES to the LAD.  He follows with [facility] and was seen earlier this month for follow-up.  Doing well at the time.  He had completed a year of dual antiplatelet therapy with aspirin and Plavix.  However given the number of stents and multiple cardiovascular risk factors the decision was made to continue dual antiplatelet therapy since he remains low risk for bleeding.  Today he denies chest pain.  Plan to continue aspirin, Plavix, and Lipitor.

## 2020-09-24 NOTE — Assessment & Plan Note (Signed)
Immunochemical fecal occult testing ordered.

## 2020-09-24 NOTE — Progress Notes (Signed)
Subjective:   Patient ID: Jonathan Barker male   DOB: 1966-01-06 55 y.o.   MRN: [id number]  HPI: Jonathan Barker is a 55 y.o. male with past medical history outlined below here for follow up of his chronic medical conditions. For the details of today's visit, please refer to the assessment and plan.   Past Medical History:  Diagnosis Date  . ACS (acute coronary syndrome) ([place])   . Astrocytoma of frontal lobe ([place]) 05/31/2013  . Coronary artery disease   . DM (diabetes mellitus) ([place]) 08/09/2011   A1c at diagnosis = 12.4  . Family history of breast cancer   . GERD (gastroesophageal reflux disease) 08/09/2011  . H/O heart artery stent 07/14/2018  . Hyperlipidemia   . LAD stenosis   . Non-ST elevation (NSTEMI) myocardial infarction ([place]) 07/15/2019  . Nonsustained ventricular tachycardia ([place])   . NSVT (nonsustained ventricular tachycardia) ([place])   . Seizure ([place]) 05/19/2013  . Shortness of breath    Current Outpatient Medications  Medication Sig Dispense Refill  . aspirin 81 MG chewable tablet Chew 1 tablet (81 mg total) by mouth daily. 90 tablet 3  . atorvastatin (LIPITOR) 80 MG tablet TAKE 1 TABLET (80 MG TOTAL) BY MOUTH DAILY AT 6 PM. 90 tablet 2  . blood glucose meter kit and supplies KIT Dispense based on patient and insurance preference. Use up to four times daily as directed. (FOR ICD-9 250.00, 250.01). 1 each 0  . Blood Pressure Monitoring (BLOOD PRESSURE CUFF) MISC 1 each by Does not apply route daily as needed. 1 each 0  . clopidogrel (PLAVIX) 75 MG tablet TAKE 1 TABLET BY MOUTH ONCE A DAY (NEEDS APPT) 90 tablet 0  . Empagliflozin-metFORMIN HCl 12.08-998 MG TABS TAKE 1 TABLET BY MOUTH TWICE DAILY 180 tablet 0  . gabapentin (NEURONTIN) 300 MG capsule TAKE 1 CAPSULE BY MOUTH AT BEDTIME 90 capsule 3  . Insulin Glargine (BASAGLAR KWIKPEN) 100 UNIT/ML Inject 0.12 mLs (12 Units total) into the skin daily. (Patient taking differently: Inject 10 Units into the skin daily.) 15 mL  0  . Insulin Pen Needle (PEN NEEDLES) 32G X 4 MM MISC 1 pen by Does not apply route as directed. 100 each 11  . lisinopril (ZESTRIL) 5 MG tablet TAKE 1 TABLET BY MOUTH ONCE A DAY (NEEDS APPT) 90 tablet 0  . magnesium oxide (MAG-OX) 400 (241.3 Mg) MG tablet Take 0.5 tablets (200 mg total) by mouth 2 (two) times daily. 90 tablet 1  . metoprolol tartrate (LOPRESSOR) 50 MG tablet Take 50 mg by mouth 2 (two) times daily.    . nitroGLYCERIN (NITROSTAT) 0.4 MG SL tablet Place 1 tablet (0.4 mg total) under the tongue every 5 (five) minutes x 3 doses as needed for chest pain. 90 tablet 3  . oxyCODONE-acetaminophen (PERCOCET/ROXICET) 5-325 MG tablet Take by mouth every 6 (six) hours as needed for severe pain.    . phenytoin (DILANTIN) 100 MG ER capsule TAKE 3 CAPSULES BY MOUTH EVERY MORNING 90 capsule 6   No current facility-administered medications for this visit.   Family History  Problem Relation Age of Onset  . Breast cancer Mother        dx in her 48s-60s  . Bone cancer Mother   . Diabetes Father   . Heart disease Father   . Diabetes Brother   . Diabetes Paternal Uncle    Social History   Socioeconomic History  . Marital status: Single    Spouse name: Not on file  .  Number of children: 2  . Years of education: Not on file  . Highest education level: Not on file  Occupational History    Employer: [employer]  Tobacco Use  . Smoking status: Current Every Day Smoker    Packs/day: 1.00    Years: 33.00    Pack years: 33.00    Types: Cigarettes  . Smokeless tobacco: Never Used  Vaping Use  . Vaping Use: Never used  Substance and Sexual Activity  . Alcohol use: Not Currently    Alcohol/week: 0.0 standard drinks  . Drug use: Yes    Types: Marijuana    Comment: Marijuana daily  . Sexual activity: Not on file  Other Topics Concern  . Not on file  Social History Narrative   [place] the [place]. He does not work now.    Right handed      Highest level of  edu- 11th grade      Lives in one story home. Steps to enter home   Social Determinants of Health   Financial Resource Strain: Not on file  Food Insecurity: Not on file  Transportation Needs: Not on file  Physical Activity: Not on file  Stress: Not on file  Social Connections: Not on file    Review of Systems: Review of Systems  Cardiovascular: Negative for chest pain and leg swelling.  Musculoskeletal:       Bilateral feet pain      Objective:  Physical Exam:  Vitals:   09/24/20 0927  BP: 106/71  Pulse: 76  Temp: 97.8 F (36.6 C)  TempSrc: Oral  SpO2: 99%  Weight: 174 lb 4.8 oz (79.1 kg)  Height: 5' 11"  (1.803 m)    Physical Exam Constitutional:      Appearance: Normal appearance.  Cardiovascular:     Rate and Rhythm: Normal rate and regular rhythm.     Heart sounds: Normal heart sounds.  Pulmonary:     Effort: Pulmonary effort is normal. No respiratory distress.     Breath sounds: Normal breath sounds.  Musculoskeletal:     Right lower leg: No edema.     Left lower leg: No edema.     Comments: Left foot with 2+ dorsalis pedis and posterior tibialis pulses.  Right foot with absent posterior tibialis and faint dorsalis pedis pulse.  Feet are warm, no edema or evidence of acute ischemia.  Neurological:     Mental Status: He is alert.  Psychiatric:        Mood and Affect: Mood normal.        Behavior: Behavior normal.      Assessment & Plan:   See Encounters Tab for problem based charting.

## 2020-09-24 NOTE — Patient Instructions (Addendum)
Mr. Dunlap,  It was a pleasure to see you again. Today we discussed:  Diabetes: Please continue to take your combination metformin-empagliflozin pill twice a day. I have added a once weekly injection called trulicity. You can stop the insulin for now.   Foot pain: I am checking the blood flow to your legs with a test called ABIs. You will be called to schedule this.  Colon cancer screening: Please return the kit at your convenience.    Follow up with me again in 3 months. If you have any questions or concerns, call our clinic at [phone] or after hours call [phone] and ask for the internal medicine resident on call.  Thank you!  Dr. Darnell Level

## 2020-09-25 ENCOUNTER — Other Ambulatory Visit (HOSPITAL_COMMUNITY): Payer: Self-pay

## 2020-09-25 NOTE — Progress Notes (Signed)
ROI form faxed to [employer] (Fax # [phone]) for diabetic eye exam report.

## 2020-09-26 ENCOUNTER — Encounter: Payer: Self-pay | Admitting: Urology

## 2020-09-30 ENCOUNTER — Other Ambulatory Visit (HOSPITAL_COMMUNITY): Payer: Self-pay

## 2020-09-30 ENCOUNTER — Other Ambulatory Visit: Payer: Self-pay | Admitting: Urology

## 2020-10-01 ENCOUNTER — Other Ambulatory Visit: Payer: Self-pay | Admitting: Urology

## 2020-10-01 ENCOUNTER — Other Ambulatory Visit (HOSPITAL_COMMUNITY): Payer: Self-pay

## 2020-10-02 ENCOUNTER — Other Ambulatory Visit (HOSPITAL_COMMUNITY): Payer: Self-pay

## 2020-10-03 ENCOUNTER — Other Ambulatory Visit (HOSPITAL_COMMUNITY): Payer: Self-pay

## 2020-10-03 ENCOUNTER — Other Ambulatory Visit: Payer: Self-pay | Admitting: Urology

## 2020-10-03 MED ORDER — OXYCODONE-ACETAMINOPHEN 5-325 MG PO TABS
ORAL_TABLET | ORAL | 0 refills | Status: DC
  Filled 2020-10-03: qty 90, 45d supply, fill #0

## 2020-10-07 ENCOUNTER — Other Ambulatory Visit (HOSPITAL_COMMUNITY): Payer: Self-pay

## 2020-10-15 ENCOUNTER — Other Ambulatory Visit: Payer: Self-pay

## 2020-10-15 ENCOUNTER — Ambulatory Visit (HOSPITAL_COMMUNITY)
Admission: RE | Admit: 2020-10-15 | Discharge: 2020-10-15 | Disposition: A | Discharge status: Home / Self Care | Payer: Medicare Other | Source: Ambulatory Visit | Attending: Internal Medicine | Admitting: Internal Medicine

## 2020-10-15 DIAGNOSIS — I739 Peripheral vascular disease, unspecified: Secondary | ICD-10-CM | POA: Diagnosis not present

## 2020-10-15 NOTE — Progress Notes (Signed)
ABI completed. Refer to "CV Proc" under chart review to view preliminary results.  10/15/2020 9:26 AM Kelby Aline., MHA, RVT, RDCS, RDMS

## 2020-10-18 ENCOUNTER — Other Ambulatory Visit: Payer: Self-pay | Admitting: Neurology

## 2020-10-18 ENCOUNTER — Other Ambulatory Visit (HOSPITAL_COMMUNITY): Payer: Self-pay

## 2020-10-18 MED FILL — Phenytoin Sodium Extended Cap 100 MG: ORAL | 30 days supply | Qty: 90 | Fill #1 | Status: AC

## 2020-10-20 ENCOUNTER — Telehealth: Payer: Self-pay | Admitting: Neurology

## 2020-10-20 ENCOUNTER — Other Ambulatory Visit: Payer: Self-pay

## 2020-10-20 ENCOUNTER — Other Ambulatory Visit (HOSPITAL_COMMUNITY): Payer: Self-pay

## 2020-10-20 MED ORDER — GABAPENTIN 300 MG PO CAPS
ORAL_CAPSULE | Freq: Every day | ORAL | 1 refills | Status: DC
  Filled 2020-10-20: qty 30, 30d supply, fill #0
  Filled 2020-12-02: qty 30, 30d supply, fill #1

## 2020-10-20 NOTE — Telephone Encounter (Signed)
Sent to Dr.Aquino to sign off on

## 2020-10-20 NOTE — Telephone Encounter (Signed)
Tried to get medicine refilled but pharmacy said it was denied. Wants to know if he needs to be seen to get refill.has an apt 12/12/20

## 2020-10-21 ENCOUNTER — Other Ambulatory Visit (HOSPITAL_COMMUNITY): Payer: Self-pay

## 2020-10-30 ENCOUNTER — Other Ambulatory Visit (HOSPITAL_COMMUNITY): Payer: Self-pay

## 2020-10-31 ENCOUNTER — Other Ambulatory Visit: Payer: Self-pay | Admitting: Cardiology

## 2020-10-31 ENCOUNTER — Other Ambulatory Visit (HOSPITAL_COMMUNITY): Payer: Self-pay

## 2020-10-31 MED ORDER — CLOPIDOGREL BISULFATE 75 MG PO TABS
ORAL_TABLET | ORAL | 0 refills | Status: DC
Start: 2020-10-31 — End: 2021-02-12
  Filled 2020-10-31: qty 90, 90d supply, fill #0

## 2020-11-04 ENCOUNTER — Encounter: Payer: Self-pay | Admitting: *Deleted

## 2020-11-05 ENCOUNTER — Other Ambulatory Visit (HOSPITAL_COMMUNITY): Payer: Self-pay

## 2020-11-10 ENCOUNTER — Other Ambulatory Visit (HOSPITAL_COMMUNITY): Payer: Self-pay

## 2020-11-10 ENCOUNTER — Other Ambulatory Visit: Payer: Self-pay | Admitting: Cardiology

## 2020-11-11 ENCOUNTER — Other Ambulatory Visit (HOSPITAL_COMMUNITY): Payer: Self-pay

## 2020-11-11 MED ORDER — METOPROLOL TARTRATE 50 MG PO TABS
ORAL_TABLET | ORAL | 2 refills | Status: DC
  Filled 2020-11-11: qty 180, 90d supply, fill #0
  Filled 2021-06-13: qty 180, 90d supply, fill #1

## 2020-11-18 ENCOUNTER — Other Ambulatory Visit: Payer: Self-pay | Admitting: Urology

## 2020-11-18 ENCOUNTER — Other Ambulatory Visit (HOSPITAL_COMMUNITY): Payer: Self-pay

## 2020-11-18 MED ORDER — OXYCODONE-ACETAMINOPHEN 5-325 MG PO TABS
ORAL_TABLET | ORAL | 0 refills | Status: DC
  Filled 2020-11-18: qty 90, fill #0
  Filled 2020-11-21: qty 90, 45d supply, fill #0

## 2020-11-21 ENCOUNTER — Other Ambulatory Visit (HOSPITAL_COMMUNITY): Payer: Self-pay

## 2020-12-02 ENCOUNTER — Other Ambulatory Visit (HOSPITAL_COMMUNITY): Payer: Self-pay

## 2020-12-03 ENCOUNTER — Encounter: Payer: Self-pay | Admitting: Neurology

## 2020-12-12 ENCOUNTER — Telehealth: Payer: Medicare Other | Admitting: Neurology

## 2020-12-15 ENCOUNTER — Other Ambulatory Visit (HOSPITAL_COMMUNITY): Payer: Self-pay

## 2020-12-15 ENCOUNTER — Other Ambulatory Visit: Payer: Self-pay | Admitting: Cardiology

## 2020-12-15 MED ORDER — LISINOPRIL 5 MG PO TABS
ORAL_TABLET | ORAL | 0 refills | Status: DC
Start: 2020-12-15 — End: 2021-03-19
  Filled 2020-12-15: qty 90, 90d supply, fill #0

## 2020-12-15 MED FILL — Phenytoin Sodium Extended Cap 100 MG: ORAL | 30 days supply | Qty: 90 | Fill #2 | Status: AC

## 2020-12-16 ENCOUNTER — Other Ambulatory Visit (HOSPITAL_COMMUNITY): Payer: Self-pay

## 2020-12-18 ENCOUNTER — Other Ambulatory Visit: Payer: Self-pay | Admitting: Urology

## 2020-12-18 ENCOUNTER — Telehealth: Payer: Self-pay

## 2020-12-18 ENCOUNTER — Other Ambulatory Visit (HOSPITAL_COMMUNITY): Payer: Self-pay

## 2020-12-18 ENCOUNTER — Other Ambulatory Visit: Payer: Self-pay | Admitting: Radiation Oncology

## 2020-12-18 NOTE — Telephone Encounter (Signed)
This prescription is filled every 6 weeks and it has only been 4 weeks. Next refill due 12/30/20,

## 2020-12-18 NOTE — Telephone Encounter (Signed)
Received message from NS that patient called requesting refill of his prescription pain medication. Notified Ashlyn Bruning PA-C who informed me that patient would not be eligible for refill until 12/30/20 given that prescription was recently refilled on 11/18/20.   Returned patient's call and left a VM relaying above information. Provided direct call back number should patient have any other questions/concerns

## 2020-12-19 ENCOUNTER — Telehealth: Payer: Medicare Other | Admitting: Neurology

## 2020-12-22 ENCOUNTER — Other Ambulatory Visit (HOSPITAL_COMMUNITY): Payer: Self-pay

## 2020-12-31 ENCOUNTER — Other Ambulatory Visit (HOSPITAL_COMMUNITY): Payer: Self-pay

## 2020-12-31 ENCOUNTER — Other Ambulatory Visit: Payer: Self-pay | Admitting: Urology

## 2020-12-31 MED ORDER — OXYCODONE-ACETAMINOPHEN 5-325 MG PO TABS
ORAL_TABLET | ORAL | 0 refills | Status: DC
  Filled 2020-12-31: qty 90, fill #0
  Filled 2021-01-01 (×2): qty 90, 45d supply, fill #0

## 2021-01-01 ENCOUNTER — Other Ambulatory Visit (HOSPITAL_COMMUNITY): Payer: Self-pay

## 2021-01-01 ENCOUNTER — Telehealth: Payer: Self-pay | Admitting: *Deleted

## 2021-01-01 NOTE — Telephone Encounter (Signed)
Called patient to inform that his script is at [facility], spoke with patient and he is aware that his script is @ [facility]

## 2021-01-07 ENCOUNTER — Encounter: Payer: Self-pay | Admitting: Internal Medicine

## 2021-01-07 ENCOUNTER — Encounter: Payer: Self-pay | Admitting: Dietician

## 2021-01-08 ENCOUNTER — Encounter: Payer: Self-pay | Admitting: Neurology

## 2021-01-08 ENCOUNTER — Other Ambulatory Visit (HOSPITAL_COMMUNITY): Payer: Self-pay

## 2021-01-08 ENCOUNTER — Telehealth (INDEPENDENT_AMBULATORY_CARE_PROVIDER_SITE_OTHER): Payer: Medicare Other | Admitting: Neurology

## 2021-01-08 ENCOUNTER — Other Ambulatory Visit: Payer: Self-pay

## 2021-01-08 VITALS — Ht 72.0 in | Wt 180.0 lb

## 2021-01-08 DIAGNOSIS — C719 Malignant neoplasm of brain, unspecified: Secondary | ICD-10-CM

## 2021-01-08 DIAGNOSIS — E0842 Diabetes mellitus due to underlying condition with diabetic polyneuropathy: Secondary | ICD-10-CM | POA: Diagnosis not present

## 2021-01-08 DIAGNOSIS — G40109 Localization-related (focal) (partial) symptomatic epilepsy and epileptic syndromes with simple partial seizures, not intractable, without status epilepticus: Secondary | ICD-10-CM | POA: Diagnosis not present

## 2021-01-08 MED ORDER — GABAPENTIN 300 MG PO CAPS
ORAL_CAPSULE | Freq: Every day | ORAL | 3 refills | Status: DC
  Filled 2021-01-08: qty 90, 90d supply, fill #0
  Filled 2021-04-28: qty 90, 90d supply, fill #1
  Filled 2021-07-27: qty 90, 90d supply, fill #2

## 2021-01-08 MED ORDER — PHENYTOIN SODIUM EXTENDED 100 MG PO CAPS
ORAL_CAPSULE | ORAL | 3 refills | Status: DC
  Filled 2021-01-08: qty 180, fill #0
  Filled 2021-02-23: qty 180, 90d supply, fill #0
  Filled 2021-06-01: qty 40, 20d supply, fill #1
  Filled 2021-06-01: qty 140, 70d supply, fill #1
  Filled 2021-09-03: qty 180, 90d supply, fill #2

## 2021-01-08 NOTE — Patient Instructions (Signed)
Continue Dilantin '200mg'$  every morning. Call for any change in symptoms  2. Continue Gabapentin '300mg'$  every night  3. Follow-up in 1 year, call for any changes  Seizure Precautions: 1. If medication has been prescribed for you to prevent seizures, take it exactly as directed.  Do not stop taking the medicine without talking to your doctor first, even if you have not had a seizure in a long time.   2. Avoid activities in which a seizure would cause danger to yourself or to others.  Don't operate dangerous machinery, swim alone, or climb in high or dangerous places, such as on ladders, roofs, or girders.  Do not drive unless your doctor says you may.  3. If you have any warning that you may have a seizure, lay down in a safe place where you can't hurt yourself.    4.  No driving for 6 months from last seizure, as per [facility].   Please refer to the following link on the [facility] website for more information: http://www.epilepsyfoundation.org/answerplace/Social/driving/drivingu.cfm   5.  Maintain good sleep hygiene. Avoid alcohol.  6.  Contact your doctor if you have any problems that may be related to the medicine you are taking.  7.  Call 911 and bring the patient back to the ED if:        A.  The seizure lasts longer than 5 minutes.       B.  The patient doesn't awaken shortly after the seizure  C.  The patient has new problems such as difficulty seeing, speaking or moving  D.  The patient was injured during the seizure  E.  The patient has a temperature over 102 F (39C)  F.  The patient vomited and now is having trouble breathing

## 2021-01-08 NOTE — Progress Notes (Signed)
Virtual Visit via Video Note The purpose of this virtual visit is to provide medical care while limiting exposure to the novel coronavirus.    Consent was obtained for video visit:  Yes.   Answered questions that patient had about telehealth interaction:  Yes.   I discussed the limitations, risks, security and privacy concerns of performing an evaluation and management service by telemedicine. I also discussed with the patient that there may be a patient responsible charge related to this service. The patient expressed understanding and agreed to proceed.  Pt location: Home Physician Location: office Name of referring provider:  Velna Ochs, MD I connected with Jonathan Barker at patients initiation/request on 01/08/2021 at  8:30 AM EDT by video enabled telemedicine application and verified that I am speaking with the correct person using two identifiers. Pt MRN:  [id number] Pt DOB:  03-Feb-1966 Video Participants:  Jonathan Barker   History of Present Illness:  The patient had a virtual video visit on 01/08/2021. He was last evaluated in the neurology clinic over a year ago for neuropathy and seizures secondary to bifrontal grade II astrocytoma. Since his last visit, he reports that he was instructed by his oncologist to reduce the Dilantin to 246m daily due to neuropathy, although I do not see these instructions on notes on EPIC. He reduced it 4 months ago and denies any seizures or seizure-like symptoms since January 2015. He denies any staring/unresponsive episodes, gaps in time, focal weakness, myoclonic jerks. He was reporting significant pain in the 1st digit of both feet and was started on gabapentin 3068mqhs. He states that the pain was waking him up in the middle of the night. He states that he had the big toe nails cut all the way down and he has had not more pain since then. He reports his HbA1c is still elevated at 8.2.He denies any falls. He has occasional headaches at  night and takes 1-2 Percocet. No dizziness. I personally reviewed his most recent brain MRI with and without contrast done 08/2020 which did not show any acute changes with right frontal lobe post-biopsy changes with cystic encephalomalacia, 2 small areas of enhancement in the right frontal lobe similar versus slightly decreased compared to prior MRI in 02/2020.   History on Initial Assessment 04/15/2015: This is a pleasant 55yo RH man who had a new onset seizure last 05/18/13 and was found to have an infiltrative mass in the anteromedial right frontal lobe without enhancement, with FLAIR signal extending across the corpus callosum into the contralateral subcortical left frontal lobe. His daughter reports that he started blinking excessively, he recalls feeling his eyes "blinking at 100 mph," he tried to sit but could not because his left hand was locked inside his pocket. He was witnessed to have generalized shaking for less than a minute. He reports that he recalls the entire event, he could hear people screaming around him. His daughter reports that he was coherent right after the seizure, no post-ictal confusion or focal weakness. [facility], biopsy demonstrated diffuse grade 2 astrocytoma. Wake and drowsy EEG was normal. He took Temodar, then underwent radiotherapy until May 2015. He denies any further seizures since January, but does feel that he may have had a seizure the week after hospital discharge in January 2015, he woke up with his arms flexed, unsure if he had dreamed he had a seizure, no associated tongue bite or incontinence. His wife  had mentioned around that time that she felt the bed shaking one night. Since January 2015, he denies any further eye blinking episodes, generalized shaking or stiffening of extremities, olfactory/gustatory hallucinations, deja vu, rising epigastric sensation, focal numbness/tingling/weakness, myoclonic jerks. He has  had low Dilantin in levels in the past, and reports that he had only been taking it twice a day instead of TID, but with most recent level checked in November 2016 (<2.5), he reports taking Dilantin 176m TID. He denies any side effects to the medication.  He denies any prior history of headaches until brain biopsy, and since then he has been having frequent headaches. Headaches are over the frontal region, with pressure sensation, no associated nausea, vomiting, photo/phonophobia. He reports headaches occur at the end of the day, 3-4 times a week, with good response to Percocet. He usually takes the medication and goes to bed, with no headache on awakening. He notices more headaches with cold weather. He denies any dizziness, vision changes, dysarthria/dysphagia, neck/back pain, focal numbness/tingling/weakness, bowel/bladder dysfunction. He usually has 4 alcoholic drinks a month. He had a normal birth and early development.  There is no history of febrile convulsions, CNS infections such as meningitis/encephalitis, significant traumatic brain injury, or family history of seizures.  I personally reviewed most recent MRI brain with and without contrast done 02/14/15 which showed abnormal FLAIR signal throughout the white matter of the frontal lobes, right more than left, unchanged, with small cystic area in the left frontal cortical/subcortical region that is noted to be slightly larger (7437m compared to last year. There is a punctate focus of enhancement in the right frontal white matter, 37m62mocus of enhancement in the right basal ganglia/external capsule that is new.     Current Outpatient Medications on File Prior to Visit  Medication Sig Dispense Refill   aspirin 81 MG chewable tablet Chew 1 tablet (81 mg total) by mouth daily. 90 tablet 3   atorvastatin (LIPITOR) 80 MG tablet TAKE 1 TABLET (80 MG TOTAL) BY MOUTH DAILY AT 6 PM. 90 tablet 2   blood glucose meter kit and supplies KIT Dispense based on  patient and insurance preference. Use up to four times daily as directed. (FOR ICD-9 250.00, 250.01). 1 each 0   Blood Pressure Monitoring (BLOOD PRESSURE CUFF) MISC 1 each by Does not apply route daily as needed. 1 each 0   clopidogrel (PLAVIX) 75 MG tablet TAKE 1 TABLET BY MOUTH ONCE A DAY (NEEDS APPT) 90 tablet 0   Dulaglutide (TRULICITY) 0.77.00/FV/4.9SWPN Inject 0.75 mg into the skin once a week. 2 mL 2   Empagliflozin-metFORMIN HCl 12.08-998 MG TABS Take 1 tablet by mouth 2 (two) times daily. 180 tablet 3   gabapentin (NEURONTIN) 300 MG capsule TAKE 1 CAPSULE BY MOUTH AT BEDTIME 30 capsule 1   Insulin Pen Needle (PEN NEEDLES) 32G X 4 MM MISC 1 pen by Does not apply route as directed. 100 each 11   lisinopril (ZESTRIL) 5 MG tablet TAKE 1 TABLET BY MOUTH ONCE A DAY (NEEDS APPT) 90 tablet 0   magnesium oxide (MAG-OX) 400 (241.3 Mg) MG tablet Take 0.5 tablets (200 mg total) by mouth 2 (two) times daily. 90 tablet 1   metoprolol tartrate (LOPRESSOR) 50 MG tablet Take 50 mg by mouth 2 (two) times daily.     metoprolol tartrate (LOPRESSOR) 50 MG tablet TAKE 1 TABLET TWICE DAILY- HOLD IF SYSTOLIC BP (TOP BLOOD PRESSURE NUMBER) LESS THAN 100 MMHG OR HEART RATE LESS THAN  60 BPM (PULSE). 180 tablet 2   nitroGLYCERIN (NITROSTAT) 0.4 MG SL tablet Place 1 tablet (0.4 mg total) under the tongue every 5 (five) minutes x 3 doses as needed for chest pain. 90 tablet 3   oxyCODONE-acetaminophen (PERCOCET/ROXICET) 5-325 MG tablet TAKE 1 TABLET BY MOUTH AT BEDTIME. MAY REPEAT 1 ADDITIONAL TABLET AS NEEDED 90 tablet 0   phenytoin (DILANTIN) 100 MG ER capsule TAKE 3 CAPSULES BY MOUTH EVERY MORNING 90 capsule 6   No current facility-administered medications on file prior to visit.     Observations/Objective:   Vitals:   01/08/21 0820  Weight: 180 lb (81.6 kg)  Height: 6' (1.829 m)   GEN:  The patient appears stated age and is in NAD.  Neurological examination: Patient is awake, alert. No aphasia or  dysarthria. Intact fluency and comprehension. Cranial nerves: Extraocular movements intact with no nystagmus. No facial asymmetry. Motor: moves all extremities symmetrically, at least anti-gravity x 4. No incoordination on finger to nose testing. Gait: narrow-based and steady, no ataxia   Assessment and Plan:   This is a pleasant 55 yo RH man with a history of new onset seizure last January 2015 during which he was found to have a bifrontal (right greater than left) grade II astrocytoma. He underwent Temodar treatment and radiotherapy. He has been seizure-free since 2015 . Most recent brain MRI done 08/2020 did not show any acute changes, with stable 2 enhancing foci likely representing radioinflammatory changes. He states he was told to reduce Dilantin to 256m qhs 4 months ago due to neuropathy (I do not see any record of this on notes from his physicians), he denies any seizures since 2015, continue to monitor with dose reduction. He is also on gabapentin 3062mqhs for neuropathy, although he reports foot pain has resolved after nails were cut, making neuropathy less likely as cause of foot pain. Instructed to continue working on glucose control. We discussed [place] driving laws to stop driving after a seizure until 6 months seizure-free. Follow-up in 1 year, he knows to call for any changes.     Follow Up Instructions:    -I discussed the assessment and treatment plan with the patient. The patient was provided an opportunity to ask questions and all were answered. The patient agreed with the plan and demonstrated an understanding of the instructions.   The patient was advised to call back or seek an in-person evaluation if the symptoms worsen or if the condition fails to improve as anticipated.    KaCameron SprangMD

## 2021-01-13 ENCOUNTER — Other Ambulatory Visit (HOSPITAL_COMMUNITY): Payer: Self-pay

## 2021-02-11 ENCOUNTER — Other Ambulatory Visit: Payer: Self-pay | Admitting: Urology

## 2021-02-11 ENCOUNTER — Other Ambulatory Visit (HOSPITAL_COMMUNITY): Payer: Self-pay

## 2021-02-11 MED ORDER — OXYCODONE-ACETAMINOPHEN 5-325 MG PO TABS
ORAL_TABLET | ORAL | 0 refills | Status: DC
  Filled 2021-02-11 – 2021-02-12 (×2): qty 90, 45d supply, fill #0

## 2021-02-12 ENCOUNTER — Other Ambulatory Visit (HOSPITAL_COMMUNITY): Payer: Self-pay

## 2021-02-12 ENCOUNTER — Other Ambulatory Visit: Payer: Self-pay | Admitting: Cardiology

## 2021-02-12 MED ORDER — CLOPIDOGREL BISULFATE 75 MG PO TABS
ORAL_TABLET | ORAL | 0 refills | Status: DC
  Filled 2021-02-12: qty 90, 90d supply, fill #0

## 2021-02-23 ENCOUNTER — Other Ambulatory Visit (HOSPITAL_COMMUNITY): Payer: Self-pay

## 2021-02-24 ENCOUNTER — Other Ambulatory Visit (HOSPITAL_COMMUNITY): Payer: Self-pay

## 2021-02-26 ENCOUNTER — Other Ambulatory Visit (HOSPITAL_COMMUNITY): Payer: Self-pay

## 2021-03-12 ENCOUNTER — Ambulatory Visit: Payer: Medicare Other | Admitting: Cardiology

## 2021-03-19 ENCOUNTER — Other Ambulatory Visit (HOSPITAL_COMMUNITY): Payer: Self-pay

## 2021-03-19 ENCOUNTER — Ambulatory Visit: Payer: Medicare Other | Admitting: Cardiology

## 2021-03-19 ENCOUNTER — Other Ambulatory Visit: Payer: Self-pay

## 2021-03-19 ENCOUNTER — Encounter: Payer: Self-pay | Admitting: Cardiology

## 2021-03-19 VITALS — BP 114/82 | HR 80 | Resp 16 | Ht 72.0 in | Wt 170.8 lb

## 2021-03-19 DIAGNOSIS — Z720 Tobacco use: Secondary | ICD-10-CM

## 2021-03-19 DIAGNOSIS — I214 Non-ST elevation (NSTEMI) myocardial infarction: Secondary | ICD-10-CM

## 2021-03-19 DIAGNOSIS — E782 Mixed hyperlipidemia: Secondary | ICD-10-CM

## 2021-03-19 DIAGNOSIS — I2510 Atherosclerotic heart disease of native coronary artery without angina pectoris: Secondary | ICD-10-CM | POA: Diagnosis not present

## 2021-03-19 DIAGNOSIS — I255 Ischemic cardiomyopathy: Secondary | ICD-10-CM

## 2021-03-19 DIAGNOSIS — Z794 Long term (current) use of insulin: Secondary | ICD-10-CM

## 2021-03-19 DIAGNOSIS — C711 Malignant neoplasm of frontal lobe: Secondary | ICD-10-CM

## 2021-03-19 DIAGNOSIS — Z95820 Peripheral vascular angioplasty status with implants and grafts: Secondary | ICD-10-CM

## 2021-03-19 DIAGNOSIS — I4729 Other ventricular tachycardia: Secondary | ICD-10-CM

## 2021-03-19 MED ORDER — ENTRESTO 24-26 MG PO TABS
1.0000 | ORAL_TABLET | Freq: Two times a day (BID) | ORAL | 0 refills | Status: AC
  Filled 2021-03-19: qty 60, 30d supply, fill #0

## 2021-03-19 NOTE — Progress Notes (Signed)
Jonathan Barker Date of Birth: [birth year] MRN: [id number] Primary Care Provider:Guilloud, Hoyle Sauer, MD Former Cardiology Providers: Dr. Adrian Prows Primary Cardiologist: Rex Kras, DO (March 2021)  Date: 03/19/21 Last Office Visit: 09/08/2020   Chief Complaint  Patient presents with   Coronary Artery Disease   Follow-up   HPI  Jonathan Barker is a 55 y.o. male who presents to the office with a chief complaint of " 6-monthfollow-up for CAD management." Patient's past medical history and cardiac risk factors include: Recent non-STEMI, established coronary artery disease with PCI, ischemic cardiomyopathy, hypertension, hyperlipidemia, insulin-dependent diabetes mellitus type 2, history of nonsustained ventricular tachycardia, active tobacco use, history of astrocytoma.  In March 2021 he presented to the hospital with chest pain but did not meet STEMI criteria but due to ongoing chest pain and elevated cardiac biomarkers underwent LHC urgently.  Noted to have obstructive CAD and successfully underwent PTCA and stenting as outlined below.  He presents for 658-monthollow-up visit.  He denies any chest pain or shortness of breath at rest or with effort related activities.  He still enjoys playing golf regularly.  He has reduced his smoking to half a pack per day.  He still consumes soda on a regular basis.  ALLERGIES: No Known Allergies   MEDICATION LIST PRIOR TO VISIT: Current Outpatient Medications on File Prior to Visit  Medication Sig Dispense Refill   aspirin 81 MG chewable tablet Chew 1 tablet (81 mg total) by mouth daily. 90 tablet 3   atorvastatin (LIPITOR) 80 MG tablet TAKE 1 TABLET (80 MG TOTAL) BY MOUTH DAILY AT 6 PM. 90 tablet 2   blood glucose meter kit and supplies KIT Dispense based on patient and insurance preference. Use up to four times daily as directed. (FOR ICD-9 250.00, 250.01). 1 each 0   Blood Pressure Monitoring (BLOOD PRESSURE CUFF) MISC 1 each by Does not  apply route daily as needed. 1 each 0   clopidogrel (PLAVIX) 75 MG tablet TAKE 1 TABLET BY MOUTH ONCE A DAY (NEEDS APPT) 90 tablet 0   Dulaglutide (TRULICITY) 0.4.62GMM/3.8TROPN Inject 0.75 mg into the skin once a week. 2 mL 2   Empagliflozin-metFORMIN HCl 12.08-998 MG TABS Take 1 tablet by mouth 2 (two) times daily. 180 tablet 3   gabapentin (NEURONTIN) 300 MG capsule TAKE 1 CAPSULE BY MOUTH AT BEDTIME 90 capsule 3   Insulin Pen Needle (PEN NEEDLES) 32G X 4 MM MISC 1 pen by Does not apply route as directed. 100 each 11   magnesium oxide (MAG-OX) 400 (241.3 Mg) MG tablet Take 0.5 tablets (200 mg total) by mouth 2 (two) times daily. 90 tablet 1   metoprolol tartrate (LOPRESSOR) 50 MG tablet TAKE 1 TABLET TWICE DAILY- HOLD IF SYSTOLIC BP (TOP BLOOD PRESSURE NUMBER) LESS THAN 100 MMHG OR HEART RATE LESS THAN 60 BPM (PULSE). 180 tablet 2   nitroGLYCERIN (NITROSTAT) 0.4 MG SL tablet Place 1 tablet (0.4 mg total) under the tongue every 5 (five) minutes x 3 doses as needed for chest pain. 90 tablet 3   oxyCODONE-acetaminophen (PERCOCET/ROXICET) 5-325 MG tablet TAKE 1 TABLET BY MOUTH AT BEDTIME. MAY REPEAT 1 ADDITIONAL TABLET AS NEEDED 90 tablet 0   phenytoin (DILANTIN) 100 MG ER capsule Take 2 capsules every morning 180 capsule 3   No current facility-administered medications on file prior to visit.    PAST MEDICAL HISTORY: Past Medical History:  Diagnosis Date   ACS (acute coronary syndrome) ([facility]   Astrocytoma  of frontal lobe ([place]) 05/31/2013   Coronary artery disease    DM (diabetes mellitus) ([place]) 08/09/2011   A1c at diagnosis = 12.4   Family history of breast cancer    GERD (gastroesophageal reflux disease) 08/09/2011   H/O heart artery stent 07/14/2018   Hyperlipidemia    LAD stenosis    Non-ST elevation (NSTEMI) myocardial infarction ([place]) 07/15/2019   Nonsustained ventricular tachycardia    NSVT (nonsustained ventricular tachycardia)    Seizure ([place]) 05/19/2013   Shortness of breath      PAST SURGICAL HISTORY: Past Surgical History:  Procedure Laterality Date   APPENDECTOMY     BRAIN BIOPSY N/A 05/24/2013   Procedure: Craniotomy for open biopsy;  Surgeon: Winfield Cunas, MD;  Location: [facility];  Service: Neurosurgery;  Laterality: N/A;  Craniotomy for open biopsy   CARDIAC CATHETERIZATION     CORONARY ANGIOPLASTY WITH STENT PLACEMENT     CORONARY/GRAFT ACUTE MI REVASCULARIZATION N/A 07/15/2019   Procedure: Coronary/Graft Acute MI Revascularization;  Surgeon: Adrian Prows, MD;  Location: [facility];  Service: Cardiovascular;  Laterality: N/A;   LEFT HEART CATH AND CORONARY ANGIOGRAPHY N/A 07/15/2019   Procedure: LEFT HEART CATH AND CORONARY ANGIOGRAPHY;  Surgeon: Adrian Prows, MD;  Location: [facility];  Service: Cardiovascular;  Laterality: N/A;    FAMILY HISTORY: The patient family history includes Bone cancer in his mother; Breast cancer in his mother; Diabetes in his brother, father, and paternal uncle; Heart disease in his father.   SOCIAL HISTORY:  The patient  reports that he has been smoking cigarettes. He has a 33.00 pack-year smoking history. He has never used smokeless tobacco. He reports that he does not currently use alcohol. He reports current drug use. Drug: Marijuana.  Review of Systems  Constitutional: Negative for chills and fever.  HENT:  Negative for ear discharge, ear pain and nosebleeds.   Eyes:  Negative for blurred vision and discharge.  Cardiovascular:  Negative for chest pain, claudication, dyspnea on exertion, leg swelling, near-syncope, orthopnea, palpitations, paroxysmal nocturnal dyspnea and syncope.  Respiratory:  Negative for cough and shortness of breath.   Endocrine: Negative for polydipsia, polyphagia and polyuria.  Hematologic/Lymphatic: Negative for bleeding problem.  Skin:  Negative for flushing and nail changes.  Musculoskeletal:  Negative for muscle cramps, muscle weakness and myalgias.  Gastrointestinal:   Negative for abdominal pain, dysphagia, hematemesis, hematochezia, melena, nausea and vomiting.  Neurological:  Negative for dizziness, focal weakness and light-headedness.   PHYSICAL EXAM: Vitals with BMI 03/19/2021 01/08/2021 09/24/2020  Height 6' 0"  6' 0"  5' 11"   Weight 170 lbs 13 oz 180 lbs 174 lbs 5 oz  BMI 23.16 41.28 78.67  Systolic 672 - 094  Diastolic 82 - 71  Pulse 80 - 76   PHYSICAL EXAM: Vitals with BMI 03/19/2021 01/08/2021 09/24/2020  Height 6' 0"  6' 0"  5' 11"   Weight 170 lbs 13 oz 180 lbs 174 lbs 5 oz  BMI 23.16 70.96 28.36  Systolic 629 - 476  Diastolic 82 - 71  Pulse 80 - 76    CONSTITUTIONAL: Well-developed and well-nourished. No acute distress.  SKIN: Skin is warm and dry. No rash noted. No cyanosis. No pallor. No jaundice HEAD: Normocephalic and atraumatic.  EYES: No scleral icterus MOUTH/THROAT: Moist oral membranes.  NECK: No JVD present. No thyromegaly noted. No carotid bruits  LYMPHATIC: No visible cervical adenopathy.  CHEST Normal respiratory effort. No intercostal retractions  LUNGS: Clear to auscultation bilaterally.  No stridor. No  wheezes. No rales.  CARDIOVASCULAR: Regular rate and rhythm, positive P5-K9, soft holosystolic murmur heard at the apex, no gallops or rubs. ABDOMINAL: Soft, nontender, nondistended, positive bowel sound in all 4 quadrants, no apparent ascites.  EXTREMITIES: No peripheral edema  HEMATOLOGIC: No significant bruising NEUROLOGIC: Oriented to person, place, and time. Nonfocal. Normal muscle tone.  PSYCHIATRIC: Normal mood and affect. Normal behavior. Cooperative   Cardiac Studies: EKG  09/08/2020: NSR, 83bpm, left axis, left anterior fascicular block, old anteroseptal infarct, TWI high lateral leads consider ischemia, without underlying injury pattern. No significant change compared to prior EKG 02/11/2020.   Echocardiogram: 07/15/2019: LVEF 30-35%, moderate to severely decreased function, with regional wall motion  abnormalities, grade 1 diastolic impairment, no mural thrombus, no pulmonary hypertension.  08/26/2020:  Left ventricle cavity is normal in size. Mild concentric hypertrophy of the left ventricle. Mid to distal anteroseptal and apical severe hypokinesis. LVEF 40-45%. Grade 1 diastolic dysfunction, normal LAP.  Mild (Grade I) mitral regurgitation.  No evidence of pulmonary hypertension.  No significant change compared to previous study on 09/12/2019.    Left Heart Catheterization 07/15/19:  LV: Mid to distal anterior, anterolateral and apical akinesis, EF 35 to at most 40%.  Hand contrast injection, not well visualized.  Normal LVEDP. RCA: Dominant, mild diffuse disease. Left main: Mildly calcified. LAD: Diffusely diseased proximal segment, mid segment had a 90% stenosis.  Mid to distal segment has minimal disease and apical LAD had 99% stenosis.  Successful PTCA and stenting of the proximal/ostial and mid LAD with implantation of a long 3.0 x 38 mm resolute Onyx DES postdilated with 3.5 x 12 mm noncompliant sapphire balloon.  90% stenosis reduced to 0% with TIMI-3 to TIMI-3 flow.  Apical LAD stenosis left alone. Ramus intermediate: Large vessel, ostium has ulcerated appearing 99% stenosis.  Upon angioplasty realized it was also calcified and probably chronic.  Successful PTCA and stenting with 2 overlapping 2.5 x 15 and distally 2.5 x 13 mm resolute Onyx DES.  9 9% stenosis reduced to 0% with TIMI-3 to TIMI-3 flow. Circumflex: Moderate to large vessel, proximal to mid segment is diffusely diseased 30%.  Large OM1 with proximal long 60% to at most 70% stenosis.  48 hour Holter monitor: Dominant rhythm normal sinus, followed by tachycardia (27% burden). Heart rate 84-125 bpm.  Average heart rate 97 bpm. No atrial fibrillation and no sinus pause greater than or equal to 2.5 seconds in duration. No episodes of nonsustained ventricular tachycardia. 7 isolated ventricular ectopic beats, 0 ventricular  pairs and 0 ventricular runs, total ventricular ectopic burden <0.01%. 11 isolated supraventricular ectopic beats, 0 supraventricular runs, total supraventricular ectopic burden 0.01%. No patient diary submitted as a part of the study.  LABORATORY DATA: CBC Latest Ref Rng & Units 07/16/2019 07/15/2019 07/14/2019  WBC 4.0 - 10.5 K/uL 21.8(H) 20.2(H) 17.0(H)  Hemoglobin 13.0 - 17.0 g/dL 14.5 17.1(H) 18.1(H)  Hematocrit 39.0 - 52.0 % 42.4 49.7 54.0(H)  Platelets 150 - 400 K/uL 299 330 345    CMP Latest Ref Rng & Units 07/24/2019 07/16/2019 07/15/2019  Glucose 65 - 99 mg/dL 189(H) 156(H) 184(H)  BUN 6 - 24 mg/dL 12 36(H) 10  Creatinine 0.76 - 1.27 mg/dL 0.71(L) 0.67 0.75  Sodium 134 - 144 mmol/L 138 141 139  Potassium 3.5 - 5.2 mmol/L 4.4 3.8 3.7  Chloride 96 - 106 mmol/L 100 112(H) 103  CO2 20 - 29 mmol/L 22 18(L) 24  Calcium 8.7 - 10.2 mg/dL 9.4 9.1 9.1  Total Protein  6.4 - 8.3 g/dL - - -  Total Bilirubin 0.20 - 1.20 mg/dL - - -  Alkaline Phos 40 - 150 U/L - - -  AST 5 - 34 U/L - - -  ALT 0 - 55 U/L - - -    Lipid Panel     Component Value Date/Time   CHOL 111 09/12/2019 0804   TRIG 105 09/12/2019 0804   HDL 36 (L) 09/12/2019 0804   CHOLHDL 5.7 07/14/2019 2336   VLDL 25 07/14/2019 2336   LDLCALC 55 09/12/2019 0804   LABVLDL 20 09/12/2019 0804    Lab Results  Component Value Date   HGBA1C 8.2 (A) 09/24/2020   HGBA1C 10.6 (H) 07/14/2019   HGBA1C 10.7 (A) 03/27/2018   No components found for: NTPROBNP Lab Results  Component Value Date   TSH 1.073 07/18/2011    FINAL MEDICATION LIST END OF ENCOUNTER: Meds ordered this encounter  Medications   sacubitril-valsartan (ENTRESTO) 24-26 MG    Sig: Take 1 tablet by mouth 2 (two) times daily.    Dispense:  60 tablet    Refill:  0     Medications Discontinued During This Encounter  Medication Reason   metoprolol tartrate (LOPRESSOR) 50 MG tablet Duplicate   lisinopril (ZESTRIL) 5 MG tablet Change in therapy     Current  Outpatient Medications:    aspirin 81 MG chewable tablet, Chew 1 tablet (81 mg total) by mouth daily., Disp: 90 tablet, Rfl: 3   atorvastatin (LIPITOR) 80 MG tablet, TAKE 1 TABLET (80 MG TOTAL) BY MOUTH DAILY AT 6 PM., Disp: 90 tablet, Rfl: 2   blood glucose meter kit and supplies KIT, Dispense based on patient and insurance preference. Use up to four times daily as directed. (FOR ICD-9 250.00, 250.01)., Disp: 1 each, Rfl: 0   Blood Pressure Monitoring (BLOOD PRESSURE CUFF) MISC, 1 each by Does not apply route daily as needed., Disp: 1 each, Rfl: 0   clopidogrel (PLAVIX) 75 MG tablet, TAKE 1 TABLET BY MOUTH ONCE A DAY (NEEDS APPT), Disp: 90 tablet, Rfl: 0   Dulaglutide (TRULICITY) 5.63 OV/5.6EP SOPN, Inject 0.75 mg into the skin once a week., Disp: 2 mL, Rfl: 2   Empagliflozin-metFORMIN HCl 12.08-998 MG TABS, Take 1 tablet by mouth 2 (two) times daily., Disp: 180 tablet, Rfl: 3   gabapentin (NEURONTIN) 300 MG capsule, TAKE 1 CAPSULE BY MOUTH AT BEDTIME, Disp: 90 capsule, Rfl: 3   Insulin Pen Needle (PEN NEEDLES) 32G X 4 MM MISC, 1 pen by Does not apply route as directed., Disp: 100 each, Rfl: 11   magnesium oxide (MAG-OX) 400 (241.3 Mg) MG tablet, Take 0.5 tablets (200 mg total) by mouth 2 (two) times daily., Disp: 90 tablet, Rfl: 1   metoprolol tartrate (LOPRESSOR) 50 MG tablet, TAKE 1 TABLET TWICE DAILY- HOLD IF SYSTOLIC BP (TOP BLOOD PRESSURE NUMBER) LESS THAN 100 MMHG OR HEART RATE LESS THAN 60 BPM (PULSE)., Disp: 180 tablet, Rfl: 2   nitroGLYCERIN (NITROSTAT) 0.4 MG SL tablet, Place 1 tablet (0.4 mg total) under the tongue every 5 (five) minutes x 3 doses as needed for chest pain., Disp: 90 tablet, Rfl: 3   oxyCODONE-acetaminophen (PERCOCET/ROXICET) 5-325 MG tablet, TAKE 1 TABLET BY MOUTH AT BEDTIME. MAY REPEAT 1 ADDITIONAL TABLET AS NEEDED, Disp: 90 tablet, Rfl: 0   phenytoin (DILANTIN) 100 MG ER capsule, Take 2 capsules every morning, Disp: 180 capsule, Rfl: 3   sacubitril-valsartan (ENTRESTO)  24-26 MG, Take 1 tablet by mouth 2 (two)  times daily., Disp: 60 tablet, Rfl: 0  IMPRESSION:    ICD-10-CM   1. Coronary artery disease involving native coronary artery of native heart without angina pectoris  I25.10 EKG 12-Lead    2. Ischemic cardiomyopathy  I25.5 sacubitril-valsartan (ENTRESTO) 24-26 MG    Basic metabolic panel    Magnesium    Magnesium    Basic metabolic panel    3. S/P angioplasty with stent  Z95.820     4. Non-ST elevation (NSTEMI) myocardial infarction ([place])  I21.4     5. Nonsustained ventricular tachycardia  I47.29     6. Mixed hyperlipidemia  E78.2     7. Tobacco use  Z72.0     8. Type 2 diabetes mellitus with other circulatory complication, with long-term current use of insulin (HCC)  E11.59    Z79.4     9. Long-term insulin use ([facility])  Z79.4     10. Astrocytoma of frontal lobe ([facility])  C71.1        RECOMMENDATIONS: Jonathan Barker is a 55 y.o. male whose past medical history and cardiac risk factors include: History of astrocytoma, non-STEMI, nonsustained ventricular tachycardia, established coronary artery disease a status post angioplasty and stenting, ischemic cardiomyopathy, uncontrolled diabetes mellitus type 2, hyperlipidemia, tobacco use.  Established coronary artery disease status post stenting: Currently on dual antiplatelet therapy.   He has been on DAPT for at least 1 year after his coronary interventions.   We discussed discontinuation of Plavix 75 mg p.o. daily and continuing aspirin indefinitely.  However, both the patient and his daughter feel more comfortable continuing dual antiplatelet therapy given the number of stents, residual disease, and his risk factors of diabetes and smoking.   Denies anginal discomfort. No use of sublingual nitroglycerin tablets.   Recent echo notes mildly reduced LVEF with similar regional wall motion abnormalities compared to prior studies.   Recommended transition to Toprol-XL; however, he favors  Lopressor.   His blood pressures are better compared to his prior visits.  Recommended discontinuation of lisinopril.  And after 48 hours of washout to start Entresto 24/26 mg p.o. twice daily.  30-day voucher has been provided to the patient at today's visit.  Repeat blood work in 1 week to evaluate kidney function and electrolytes.  If his blood pressures remain stable, renal function remained stable, would recommend long-term Entresto going forward.   Educated on the importance of complete smoking cessation.  Ischemic cardiomyopathy: Chronic and stable.  Medications reconciled.  See above.  History of nonsustained ventricular tachycardia: Continue beta-blocker therapy.  Currently on magnesium supplements.  No recurrence on repeat Holter and beta blocker tx.   Active tobacco use: Tobacco cessation counseling: Currently smoking 0.5 packs/day   Patient was informed of the dangers of tobacco abuse including stroke, cancer, and MI, as well as benefits of tobacco cessation. Patient is willing to quit at this time. 5 mins were spent counseling patient cessation techniques. We discussed various methods to help quit smoking, including deciding on a date to quit, joining a support group, pharmacological agents- nicotine gum/patch/lozenges.  I will reassess his progress at the next follow-up visit  Insulin-dependent diabetes mellitus type 2:  Currently managed by primary care provider.   Educated on importance of glycemic control given his underlying CAD.   Recommended that he follows with his PCP for reevaluation of his A1c and diabetic medication management. Will transition from ACE inhibitor's to [place] as discussed above. Continue SGLT2 inhibitors and statin therapy.  Mixed hyperlipidemia: Currently on  high intensity statin therapy after his non-STEMI.  --Continue cardiac medications as reconciled in final medication list. --Return in about 1 year (around 03/19/2022) for Follow up, CAD. Or  sooner if needed. --Continue follow-up with your primary care physician regarding the management of your other chronic comorbid conditions.  Patient's questions and concerns were addressed to his satisfaction. He voices understanding of the instructions provided during this encounter.   This note was created using a voice recognition software as a result there may be grammatical errors inadvertently enclosed that do not reflect the nature of this encounter. Every attempt is made to correct such errors.  Rex Kras, [place], [facility]  Pager: [phone] Office: [phone]

## 2021-03-23 ENCOUNTER — Other Ambulatory Visit (HOSPITAL_COMMUNITY): Payer: Self-pay

## 2021-03-23 ENCOUNTER — Other Ambulatory Visit: Payer: Self-pay | Admitting: Radiation Oncology

## 2021-03-23 MED ORDER — OXYCODONE-ACETAMINOPHEN 5-325 MG PO TABS
1.0000 | ORAL_TABLET | Freq: Four times a day (QID) | ORAL | 0 refills | Status: DC | PRN
  Filled 2021-03-23: qty 90, 12d supply, fill #0

## 2021-03-30 DIAGNOSIS — I255 Ischemic cardiomyopathy: Secondary | ICD-10-CM | POA: Diagnosis not present

## 2021-03-31 LAB — BASIC METABOLIC PANEL
BUN/Creatinine Ratio: 21 — ABNORMAL HIGH (ref 9–20)
BUN: 17 mg/dL (ref 6–24)
CO2: 22 mmol/L (ref 20–29)
Calcium: 9.3 mg/dL (ref 8.7–10.2)
Chloride: 100 mmol/L (ref 96–106)
Creatinine, Ser: 0.82 mg/dL (ref 0.76–1.27)
Glucose: 240 mg/dL — ABNORMAL HIGH (ref 70–99)
Potassium: 4.3 mmol/L (ref 3.5–5.2)
Sodium: 136 mmol/L (ref 134–144)
eGFR: 104 mL/min/{1.73_m2} (ref >59)

## 2021-03-31 LAB — MAGNESIUM: Magnesium: 2 mg/dL (ref 1.6–2.3)

## 2021-03-31 NOTE — Progress Notes (Signed)
Called pt, no answer. Left vm requesting call back?

## 2021-04-01 NOTE — Progress Notes (Signed)
Second attempt to call pt, no answer. Left vm requesting call back.

## 2021-04-02 NOTE — Progress Notes (Signed)
Called and spoke to pts daughter, she voiced understanding and will relay the message to the patient.

## 2021-04-23 ENCOUNTER — Other Ambulatory Visit (HOSPITAL_COMMUNITY): Payer: Self-pay

## 2021-04-23 ENCOUNTER — Other Ambulatory Visit: Payer: Self-pay | Admitting: Radiation Oncology

## 2021-04-24 ENCOUNTER — Other Ambulatory Visit (HOSPITAL_COMMUNITY): Payer: Self-pay

## 2021-04-28 ENCOUNTER — Other Ambulatory Visit (HOSPITAL_COMMUNITY): Payer: Self-pay

## 2021-04-28 ENCOUNTER — Other Ambulatory Visit: Payer: Self-pay | Admitting: Radiation Oncology

## 2021-04-28 ENCOUNTER — Other Ambulatory Visit: Payer: Self-pay

## 2021-04-28 MED ORDER — GABAPENTIN 300 MG PO CAPS
ORAL_CAPSULE | Freq: Every day | ORAL | 0 refills | Status: DC
  Filled 2021-11-13: qty 90, 90d supply, fill #0

## 2021-04-29 ENCOUNTER — Other Ambulatory Visit: Payer: Self-pay | Admitting: Radiation Oncology

## 2021-04-29 ENCOUNTER — Other Ambulatory Visit (HOSPITAL_COMMUNITY): Payer: Self-pay

## 2021-04-30 ENCOUNTER — Other Ambulatory Visit (HOSPITAL_COMMUNITY): Payer: Self-pay

## 2021-05-01 ENCOUNTER — Other Ambulatory Visit (HOSPITAL_COMMUNITY): Payer: Self-pay

## 2021-05-04 ENCOUNTER — Other Ambulatory Visit (HOSPITAL_COMMUNITY): Payer: Self-pay

## 2021-05-04 ENCOUNTER — Other Ambulatory Visit: Payer: Self-pay | Admitting: Radiation Oncology

## 2021-05-04 MED ORDER — OXYCODONE-ACETAMINOPHEN 5-325 MG PO TABS
1.0000 | ORAL_TABLET | Freq: Four times a day (QID) | ORAL | 0 refills | Status: DC | PRN
  Filled 2021-05-04: qty 90, 12d supply, fill #0

## 2021-05-12 ENCOUNTER — Ambulatory Visit: Payer: Medicare Other | Admitting: Internal Medicine

## 2021-05-14 ENCOUNTER — Ambulatory Visit: Payer: Medicare Other | Admitting: Internal Medicine

## 2021-05-14 ENCOUNTER — Other Ambulatory Visit: Payer: Self-pay | Admitting: Cardiology

## 2021-05-14 ENCOUNTER — Other Ambulatory Visit (HOSPITAL_COMMUNITY): Payer: Self-pay

## 2021-05-14 MED ORDER — CLOPIDOGREL BISULFATE 75 MG PO TABS
ORAL_TABLET | ORAL | 0 refills | Status: DC
  Filled 2021-05-14: qty 90, 90d supply, fill #0

## 2021-05-16 ENCOUNTER — Other Ambulatory Visit (HOSPITAL_COMMUNITY): Payer: Self-pay

## 2021-05-18 ENCOUNTER — Other Ambulatory Visit (HOSPITAL_COMMUNITY): Payer: Self-pay

## 2021-05-25 ENCOUNTER — Ambulatory Visit
Admission: RE | Admit: 2021-05-25 | Discharge: 2021-05-25 | Disposition: A | Discharge status: Home / Self Care | Payer: Medicare Other | Source: Ambulatory Visit | Attending: Internal Medicine | Admitting: Internal Medicine

## 2021-05-25 ENCOUNTER — Other Ambulatory Visit: Payer: Self-pay

## 2021-05-25 DIAGNOSIS — C711 Malignant neoplasm of frontal lobe: Secondary | ICD-10-CM

## 2021-05-25 DIAGNOSIS — Q046 Congenital cerebral cysts: Secondary | ICD-10-CM | POA: Diagnosis not present

## 2021-05-25 DIAGNOSIS — C719 Malignant neoplasm of brain, unspecified: Secondary | ICD-10-CM | POA: Diagnosis not present

## 2021-05-25 MED ORDER — GADOBENATE DIMEGLUMINE 529 MG/ML IV SOLN
16.0000 mL | Freq: Once | INTRAVENOUS | Status: AC | PRN
  Administered 2021-05-25: 16 mL via INTRAVENOUS

## 2021-05-26 ENCOUNTER — Telehealth: Payer: Self-pay | Admitting: Cardiology

## 2021-05-26 ENCOUNTER — Telehealth: Payer: Self-pay

## 2021-05-26 NOTE — Telephone Encounter (Signed)
Pt req refill for

## 2021-05-26 NOTE — Telephone Encounter (Signed)
Patient called and left a VM requesting a refill on Entresto, but I do not see it in his medication list. Is it ok to refill? Please advise.

## 2021-05-26 NOTE — Telephone Encounter (Signed)
Patient called requesting refill on new dose of Entresto patient is currently on Entresto 24/26 BID patient was told after labs if everything came back normal patient could be started on long-term Entresto please advise

## 2021-05-27 ENCOUNTER — Other Ambulatory Visit: Payer: Self-pay

## 2021-05-27 ENCOUNTER — Other Ambulatory Visit (HOSPITAL_COMMUNITY): Payer: Self-pay

## 2021-05-27 MED ORDER — ENTRESTO 24-26 MG PO TABS
1.0000 | ORAL_TABLET | Freq: Two times a day (BID) | ORAL | 1 refills | Status: DC
  Filled 2021-05-27: qty 180, 90d supply, fill #0
  Filled 2022-01-11: qty 180, 90d supply, fill #1

## 2021-05-27 NOTE — Telephone Encounter (Signed)
Please refill his Entresto 24/26mg  po bid

## 2021-05-27 NOTE — Telephone Encounter (Signed)
Rx has been refilled.  

## 2021-05-27 NOTE — Telephone Encounter (Signed)
Duplicate message.   Please see the other message.

## 2021-05-28 ENCOUNTER — Other Ambulatory Visit: Payer: Self-pay

## 2021-05-28 ENCOUNTER — Inpatient Hospital Stay: Payer: Medicare Other | Attending: Internal Medicine | Admitting: Internal Medicine

## 2021-05-28 ENCOUNTER — Other Ambulatory Visit (HOSPITAL_COMMUNITY): Payer: Self-pay

## 2021-05-28 VITALS — BP 99/78 | HR 62 | Temp 97.8°F | Resp 18 | Ht 72.0 in | Wt 175.1 lb

## 2021-05-28 DIAGNOSIS — Z9049 Acquired absence of other specified parts of digestive tract: Secondary | ICD-10-CM | POA: Insufficient documentation

## 2021-05-28 DIAGNOSIS — I252 Old myocardial infarction: Secondary | ICD-10-CM | POA: Insufficient documentation

## 2021-05-28 DIAGNOSIS — I219 Acute myocardial infarction, unspecified: Secondary | ICD-10-CM | POA: Diagnosis not present

## 2021-05-28 DIAGNOSIS — F17210 Nicotine dependence, cigarettes, uncomplicated: Secondary | ICD-10-CM | POA: Diagnosis not present

## 2021-05-28 DIAGNOSIS — Z8249 Family history of ischemic heart disease and other diseases of the circulatory system: Secondary | ICD-10-CM | POA: Insufficient documentation

## 2021-05-28 DIAGNOSIS — Z803 Family history of malignant neoplasm of breast: Secondary | ICD-10-CM | POA: Insufficient documentation

## 2021-05-28 DIAGNOSIS — Z923 Personal history of irradiation: Secondary | ICD-10-CM | POA: Insufficient documentation

## 2021-05-28 DIAGNOSIS — F1290 Cannabis use, unspecified, uncomplicated: Secondary | ICD-10-CM | POA: Diagnosis not present

## 2021-05-28 DIAGNOSIS — I2510 Atherosclerotic heart disease of native coronary artery without angina pectoris: Secondary | ICD-10-CM | POA: Diagnosis not present

## 2021-05-28 DIAGNOSIS — E785 Hyperlipidemia, unspecified: Secondary | ICD-10-CM | POA: Diagnosis not present

## 2021-05-28 DIAGNOSIS — G40109 Localization-related (focal) (partial) symptomatic epilepsy and epileptic syndromes with simple partial seizures, not intractable, without status epilepticus: Secondary | ICD-10-CM

## 2021-05-28 DIAGNOSIS — Z79899 Other long term (current) drug therapy: Secondary | ICD-10-CM | POA: Insufficient documentation

## 2021-05-28 DIAGNOSIS — E119 Type 2 diabetes mellitus without complications: Secondary | ICD-10-CM | POA: Diagnosis not present

## 2021-05-28 DIAGNOSIS — Z833 Family history of diabetes mellitus: Secondary | ICD-10-CM | POA: Diagnosis not present

## 2021-05-28 DIAGNOSIS — Z808 Family history of malignant neoplasm of other organs or systems: Secondary | ICD-10-CM | POA: Insufficient documentation

## 2021-05-28 DIAGNOSIS — C711 Malignant neoplasm of frontal lobe: Secondary | ICD-10-CM | POA: Diagnosis not present

## 2021-05-28 NOTE — Progress Notes (Signed)
[place] at [facility], [place] [address] [phone]   Interval Evaluation  Date of Service: 05/28/21 Patient Name: Jonathan Barker Patient MRN: [id number] Patient DOB: 1965-06-07 Provider: Ventura Sellers, MD  Identifying Statement:  Jonathan Barker is a 56 y.o. 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 Christella Noa, pathology demonstrates WHO grade II diffuse astrocytoma   06/18/2013 - 09/03/2013 Radiation Therapy   54 Gy IMRT in 30 fx, concurrent Temodar, with Dr. 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  The CNS/Brain Cancer Panel offered by [facility] includes sequencing and/or deletion duplication testing of the following 42 genes: ALK, APC, BAP1, BARD1, CDK4, CDKN2A, DICER1, EPCAM, EZH2, GPC3, HRAS, KIF1B, MEN1, MLH1, MSH2, MSH6, NF1, NF2, PHOX2B, PMS2, PO1, PRKAR1A, PTCH2, PTEN, RB1, SMARCA4, SMARCB1, SMARCE1, SUFU, TP53, TSC1, TSC2, and VHL.  The report date is June 30, 2015. 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  The updated report date is July 11, 2020.     Biomarkers:  MGMT Unknown.  IDH 1/2 Unknown.  EGFR Unknown  TERT Unknown   Interval History:  [name] presents today for follow up after MRI brain.  No new or progressive complaints today.  No seizures or headaches.  Continues to follow with cardiology for post-MI care. Golfing frequently, remains active.  Still smoking.  H+P (03/31/17) Patient presented initially in early 2015 with a focal seizure, described as "left side clenching up", "change in awareness without loss in memory".   This event led to an MRI brain which demonstrated a likely mass centered in the right frontal lobe.  This underwent biopsy on 05/24/13, pathology revealed grade II glioma.  No genetic markers were sent at that time.  Following surgery he underwent radiation therapy as above, with concurrent Temodar.  Although he did consult with a neuro-oncologist in [place], no further therapy was administered, and he has been on observation for the past 3 years.  He return today with an MRI for evaluation.  He denies any recent seizures, does complain of 3-4 headaches per week as prior.    Medications: Current Outpatient Medications on File Prior to Visit  Medication Sig Dispense Refill   aspirin 81 MG chewable tablet Chew 1 tablet (81 mg total) by mouth daily. 90 tablet 3   atorvastatin (LIPITOR) 80 MG tablet TAKE 1 TABLET (80 MG TOTAL) BY MOUTH DAILY AT 6 PM. 90 tablet 2   blood glucose meter kit and supplies KIT Dispense based on patient and insurance preference. Use up to four times daily as directed. (FOR ICD-9 250.00, 250.01). 1 each 0   Blood Pressure Monitoring (BLOOD PRESSURE CUFF) MISC 1 each by Does not apply route daily as needed. 1 each 0   clopidogrel (PLAVIX) 75 MG tablet TAKE 1 TABLET BY MOUTH ONCE A DAY (NEEDS APPT) 90 tablet 0   Dulaglutide (TRULICITY) 3.71 GG/2.6RS SOPN Inject 0.75 mg into the skin once a week. 2 mL 2   Empagliflozin-metFORMIN HCl 12.08-998 MG TABS Take 1 tablet by mouth 2 (two) times daily. 180 tablet 3   gabapentin (NEURONTIN) 300 MG capsule TAKE  1 CAPSULE BY MOUTH AT BEDTIME 90 capsule 3   gabapentin (NEURONTIN) 300 MG capsule TAKE 1 CAPSULE BY MOUTH AT BEDTIME 90 capsule 0   Insulin Pen Needle (PEN NEEDLES) 32G X 4 MM MISC 1 pen by Does not apply route as directed. 100 each 11   magnesium oxide (MAG-OX) 400 (241.3 Mg) MG tablet Take 0.5 tablets (200 mg total) by mouth 2 (two) times daily. 90 tablet 1   metoprolol tartrate (LOPRESSOR) 50 MG tablet TAKE 1 TABLET TWICE DAILY-  HOLD IF SYSTOLIC BP (TOP BLOOD PRESSURE NUMBER) LESS THAN 100 MMHG OR HEART RATE LESS THAN 60 BPM (PULSE). 180 tablet 2   nitroGLYCERIN (NITROSTAT) 0.4 MG SL tablet Place 1 tablet (0.4 mg total) under the tongue every 5 (five) minutes x 3 doses as needed for chest pain. 90 tablet 3   oxyCODONE-acetaminophen (PERCOCET/ROXICET) 5-325 MG tablet Take 1-2 tablets by mouth every 6 (six) hours as needed for severe pain (For Post-Craniotomy Brain Cancer Pain). 90 tablet 0   phenytoin (DILANTIN) 100 MG ER capsule Take 2 capsules every morning 180 capsule 3   sacubitril-valsartan (ENTRESTO) 24-26 MG Take 1 tablet by mouth 2 (two) times daily. 180 tablet 1   No current facility-administered medications on file prior to visit.    Allergies: No Known Allergies Past Medical History:  Past Medical History:  Diagnosis Date   ACS (acute coronary syndrome) ([place])    Astrocytoma of frontal lobe ([place]) 05/31/2013   Coronary artery disease    DM (diabetes mellitus) ([place]) 08/09/2011   A1c at diagnosis = 12.4   Family history of breast cancer    GERD (gastroesophageal reflux disease) 08/09/2011   H/O heart artery stent 07/14/2018   Hyperlipidemia    LAD stenosis    Non-ST elevation (NSTEMI) myocardial infarction ([place]) 07/15/2019   Nonsustained ventricular tachycardia    NSVT (nonsustained ventricular tachycardia)    Seizure ([place]) 05/19/2013   Shortness of breath    Past Surgical History:  Past Surgical History:  Procedure Laterality Date   APPENDECTOMY     BRAIN BIOPSY N/A 05/24/2013   Procedure: Craniotomy for open biopsy;  Surgeon: Winfield Cunas, MD;  Location: [facility];  Service: Neurosurgery;  Laterality: N/A;  Craniotomy for open biopsy   CARDIAC CATHETERIZATION     CORONARY ANGIOPLASTY WITH STENT PLACEMENT     CORONARY/GRAFT ACUTE MI REVASCULARIZATION N/A 07/15/2019   Procedure: Coronary/Graft Acute MI Revascularization;  Surgeon: Adrian Prows, MD;  Location: [facility];  Service:  Cardiovascular;  Laterality: N/A;   LEFT HEART CATH AND CORONARY ANGIOGRAPHY N/A 07/15/2019   Procedure: LEFT HEART CATH AND CORONARY ANGIOGRAPHY;  Surgeon: Adrian Prows, MD;  Location: [facility];  Service: Cardiovascular;  Laterality: N/A;   Social History:  Social History   Socioeconomic History   Marital status: Single    Spouse name: Not on file   Number of children: 2   Years of education: 11   Highest education level: Not on file  Occupational History    Employer: [employer]  Tobacco Use   Smoking status: Every Day    Packs/day: 1.00    Years: 33.00    Pack years: 33.00    Types: Cigarettes   Smokeless tobacco: Never  Vaping Use   Vaping Use: Never used  Substance and Sexual Activity   Alcohol use: Not Currently    Alcohol/week: 0.0 standard drinks   Drug use: Yes    Types: Marijuana  Comment: Marijuana daily   Sexual activity: Not on file  Other Topics Concern   Not on file  Social History Narrative   [place] the [place]. He does not work now.    Right handed      Highest level of edu- 11th grade      Lives in one story home. Steps to enter home   Social Determinants of Health   Financial Resource Strain: Not on file  Food Insecurity: Not on file  Transportation Needs: Not on file  Physical Activity: Not on file  Stress: Not on file  Social Connections: Not on file  Intimate Partner Violence: Not on file   Family History:  Family History  Problem Relation Age of Onset   Breast cancer Mother        dx in her 42s-60s   Bone cancer Mother    Diabetes Father    Heart disease Father    Diabetes Brother    Diabetes Paternal Uncle     Review of Systems: Constitutional: Denies fevers, chills or abnormal weight loss Eyes: Denies blurriness of vision Ears, nose, mouth, throat, and face: Denies mucositis or sore throat Respiratory: Denies cough, dyspnea or wheezes Cardiovascular: Denies palpitation, chest discomfort  or lower extremity swelling Gastrointestinal:  Denies nausea, constipation, diarrhea GU: Denies dysuria or incontinence Skin: Denies abnormal skin rashes Neurological: Per HPI Musculoskeletal: Denies joint pain, back or neck discomfort. No decrease in ROM Behavioral/Psych: Denies anxiety, disturbance in thought content, and mood instability  Physical Exam: Vitals:   05/28/21 1018  BP: 99/78  Pulse: 62  Resp: 18  Temp: 97.8 F (36.6 C)  SpO2: 100%    KPS: 90. General: Alert, cooperative, pleasant, in no acute distress Head: Normal EENT: No conjunctival injection or scleral icterus. Oral mucosa moist Lungs: Resp effort normal Cardiac: Regular rate and rhythm Abdomen: Soft, non-distended abdomen Skin: No rashes cyanosis or petechiae. Extremities: No clubbing or edema  Neurologic Exam: Mental Status: Awake, alert, attentive to examiner. Oriented to self and environment. Language is fluent with intact comprehension.  Cranial Nerves: Visual acuity is grossly normal. Visual fields are full. Extra-ocular movements intact. No ptosis. Face is symmetric, tongue midline. Motor: Tone and bulk are normal. Power is full in both arms and legs. Reflexes are symmetric, no pathologic reflexes present. Intact finger to nose bilaterally Sensory: Intact to light touch and temperature Gait: Normal and tandem gait is normal.   Labs: I have reviewed the data as listed    Component Value Date/Time   NA 136 03/30/2021 1016   NA 139 01/16/2014 1035   K 4.3 03/30/2021 1016   K 4.1 01/16/2014 1035   CL 100 03/30/2021 1016   CO2 22 03/30/2021 1016   CO2 24 01/16/2014 1035   GLUCOSE 240 (H) 03/30/2021 1016   GLUCOSE 156 (H) 07/16/2019 0228   GLUCOSE 188 (H) 01/16/2014 1035   BUN 17 03/30/2021 1016   BUN 11.6 01/16/2014 1035   CREATININE 0.82 03/30/2021 1016   CREATININE 1.1 01/16/2014 1035   CALCIUM 9.3 03/30/2021 1016   CALCIUM 9.5 01/16/2014 1035   PROT 7.1 11/16/2013 0921   ALBUMIN 3.9  11/16/2013 0921   AST 23 11/16/2013 0921   ALT 35 11/16/2013 0921   ALKPHOS 83 11/16/2013 0921   BILITOT 0.27 11/16/2013 0921   GFRNONAA 108 07/24/2019 1034   GFRAA 125 07/24/2019 1034   Lab Results  Component Value Date   WBC 21.8 (H) 07/16/2019   NEUTROABS 5.7  11/16/2013   HGB 14.5 07/16/2019   HCT 42.4 07/16/2019   MCV 89.5 07/16/2019   PLT 299 07/16/2019    Imaging: [place] Clinician Interpretation: I have personally reviewed the CNS images as listed.  My interpretation, in the context of the patient's clinical presentation, is stable disease  MR BRAIN W WO CONTRAST  Result Date: 05/25/2021 CLINICAL DATA:  Brain/CNS neoplasm, assess treatment response. Grade 2 astrocytoma with prior surgery and radiation. No clinical change. EXAM: MRI HEAD WITHOUT AND WITH CONTRAST TECHNIQUE: Multiplanar, multiecho pulse sequences of the brain and surrounding structures were obtained without and with intravenous contrast. CONTRAST:  28m MULTIHANCE GADOBENATE DIMEGLUMINE 529 MG/ML IV SOLN COMPARISON:  08/01/2020 FINDINGS: Brain: Diffusion imaging does not show any acute or subacute infarction or other cause of restricted diffusion. No focal abnormality affects the brainstem or cerebellum. Left cerebral hemisphere shows a stable pattern of white matter T2 and FLAIR signal in the left frontal lobe consistent with prior radiation. On the right, there has been previous frontal craniotomy with underlying resection. Cystic spaces within the right frontal lobe appear precisely unchanged since the prior exam. The amount of regional T2 and FLAIR signal within the white matter is stable in there is no evidence of increasing mass effect. Two foci of enhancement in the right frontal lobe have been previously observed. The more lateral focus is no longer visible. The more medial focus is diminishing in size, best seen on axial image 90 today, smaller than was seen last year. No new or progressive finding is seen. No  hydrocephalus.  No extra-axial collection. Vascular: Major vessels at the base of the brain show flow. Skull and upper cervical spine: Otherwise negative Sinuses/Orbits: Clear/normal Other: None IMPRESSION: No new or progressive finding. Previous right frontal craniotomy with right frontal resection. Stable white matter signal, presumably post treatment, more extensive on the right than the left. Diminishing small foci of enhancement in the right frontal region as described above. Electronically Signed   By: MNelson ChimesM.D.   On: 05/25/2021 16:07     Assessment/Plan 1. [place]   CTrevor Ihais clinically and radiographically stable today.  No new or progressive changes.  We again counseled him on food choices and advised reduction of sugar intake.   We ask that CTrevor Ihareturn to clinic in 12 months following next brain MRI, or sooner as needed.  Will con't to follow with Dr. ADelice Leschfor seizures.   All questions were answered. The patient knows to call the clinic with any problems, questions or concerns. No barriers to learning were detected.  I have spent a total of 30 minutes of face-to-face and non-face-to-face time, excluding clinical staff time, preparing to see patient, ordering tests and/or medications, counseling the patient, and independently interpreting results and communicating results to the patient/family/caregiver    ZVentura Sellers MD Medical Director of Neuro-Oncology [facility]01/26/23 10:18 AM

## 2021-06-01 ENCOUNTER — Other Ambulatory Visit (HOSPITAL_COMMUNITY): Payer: Self-pay

## 2021-06-01 ENCOUNTER — Inpatient Hospital Stay: Payer: Medicare Other

## 2021-06-02 ENCOUNTER — Other Ambulatory Visit: Payer: Self-pay | Admitting: Radiation Therapy

## 2021-06-02 ENCOUNTER — Other Ambulatory Visit (HOSPITAL_COMMUNITY): Payer: Self-pay

## 2021-06-03 ENCOUNTER — Other Ambulatory Visit (HOSPITAL_COMMUNITY): Payer: Self-pay

## 2021-06-08 ENCOUNTER — Other Ambulatory Visit (HOSPITAL_COMMUNITY): Payer: Self-pay

## 2021-06-13 ENCOUNTER — Other Ambulatory Visit: Payer: Self-pay | Admitting: Radiation Oncology

## 2021-06-13 ENCOUNTER — Other Ambulatory Visit (HOSPITAL_COMMUNITY): Payer: Self-pay

## 2021-06-15 ENCOUNTER — Other Ambulatory Visit: Payer: Self-pay | Admitting: Radiation Oncology

## 2021-06-15 ENCOUNTER — Other Ambulatory Visit (HOSPITAL_COMMUNITY): Payer: Self-pay

## 2021-06-15 MED ORDER — OXYCODONE-ACETAMINOPHEN 5-325 MG PO TABS
1.0000 | ORAL_TABLET | Freq: Four times a day (QID) | ORAL | 0 refills | Status: DC | PRN
Start: 2021-06-15 — End: 2021-07-27
  Filled 2021-06-15: qty 90, 12d supply, fill #0

## 2021-06-17 ENCOUNTER — Encounter: Payer: Medicare Other | Admitting: Internal Medicine

## 2021-07-27 ENCOUNTER — Other Ambulatory Visit: Payer: Self-pay | Admitting: Radiation Oncology

## 2021-07-27 ENCOUNTER — Other Ambulatory Visit (HOSPITAL_COMMUNITY): Payer: Self-pay

## 2021-07-28 ENCOUNTER — Telehealth: Payer: Self-pay

## 2021-07-28 ENCOUNTER — Other Ambulatory Visit (HOSPITAL_COMMUNITY): Payer: Self-pay

## 2021-07-28 MED ORDER — OXYCODONE-ACETAMINOPHEN 5-325 MG PO TABS
1.0000 | ORAL_TABLET | Freq: Four times a day (QID) | ORAL | 0 refills | Status: AC | PRN
  Filled 2021-07-28: qty 90, 12d supply, fill #0

## 2021-07-28 NOTE — Telephone Encounter (Signed)
Verified patient identity and notified [name] that his Gabapentin RX is ready at the [facility]. I spoke w/ Ms. Monica at the pharmacy and she has confirmed that this RX is ready for pick up. I gave her the patient's phone number [phone] and she states "That she [facility]) will call the patient as well notify that he may come and pick up this RX."  Patient seemed to verbalized understanding of all information. ?

## 2021-08-06 ENCOUNTER — Other Ambulatory Visit (HOSPITAL_COMMUNITY): Payer: Self-pay

## 2021-08-12 ENCOUNTER — Encounter: Payer: Medicare Other | Admitting: Internal Medicine

## 2021-08-17 ENCOUNTER — Telehealth: Payer: Self-pay | Admitting: Cardiology

## 2021-08-17 ENCOUNTER — Other Ambulatory Visit: Payer: Self-pay | Admitting: Cardiology

## 2021-08-17 ENCOUNTER — Other Ambulatory Visit (HOSPITAL_COMMUNITY): Payer: Self-pay

## 2021-08-17 NOTE — Telephone Encounter (Signed)
Pt daughter called in because her dads med refill was denied due to lack of appt however dr Terri Skains had advised them he didn't need to see him again until a year after the last appt...please advised ?

## 2021-08-17 NOTE — Telephone Encounter (Signed)
Called pt no answer, left vm  

## 2021-08-18 ENCOUNTER — Other Ambulatory Visit: Payer: Self-pay

## 2021-08-18 ENCOUNTER — Other Ambulatory Visit (HOSPITAL_COMMUNITY): Payer: Self-pay

## 2021-08-18 MED ORDER — CLOPIDOGREL BISULFATE 75 MG PO TABS
ORAL_TABLET | ORAL | 0 refills | Status: DC
  Filled 2021-08-18: qty 90, 90d supply, fill #0

## 2021-08-18 MED ORDER — CLOPIDOGREL BISULFATE 75 MG PO TABS
ORAL_TABLET | ORAL | 3 refills | Status: DC
  Filled 2021-08-18: qty 90, fill #0
  Filled 2021-08-24: qty 90, 90d supply, fill #0
  Filled 2022-03-24: qty 90, 90d supply, fill #1
  Filled 2022-07-26: qty 90, 90d supply, fill #2

## 2021-08-19 ENCOUNTER — Other Ambulatory Visit (HOSPITAL_COMMUNITY): Payer: Self-pay

## 2021-08-19 NOTE — Telephone Encounter (Signed)
Called pt, no answer. Left vm requesting call back?

## 2021-08-21 NOTE — Telephone Encounter (Signed)
Called pt, no answer. Left vm requesting call back. Refill has been sent.

## 2021-08-24 ENCOUNTER — Other Ambulatory Visit (HOSPITAL_COMMUNITY): Payer: Self-pay

## 2021-09-03 ENCOUNTER — Other Ambulatory Visit: Payer: Self-pay | Admitting: Cardiology

## 2021-09-03 ENCOUNTER — Other Ambulatory Visit (HOSPITAL_COMMUNITY): Payer: Self-pay

## 2021-09-03 MED ORDER — ATORVASTATIN CALCIUM 80 MG PO TABS
ORAL_TABLET | Freq: Every day | ORAL | 2 refills | Status: DC
  Filled 2021-09-03: qty 90, 90d supply, fill #0
  Filled 2022-03-12: qty 90, 90d supply, fill #1
  Filled 2022-06-28: qty 90, 90d supply, fill #2

## 2021-09-04 ENCOUNTER — Other Ambulatory Visit (HOSPITAL_COMMUNITY): Payer: Self-pay

## 2021-09-05 ENCOUNTER — Other Ambulatory Visit (HOSPITAL_COMMUNITY): Payer: Self-pay

## 2021-09-09 ENCOUNTER — Other Ambulatory Visit: Payer: Self-pay | Admitting: Urology

## 2021-09-09 ENCOUNTER — Other Ambulatory Visit (HOSPITAL_COMMUNITY): Payer: Self-pay

## 2021-09-09 ENCOUNTER — Telehealth: Payer: Self-pay

## 2021-09-09 MED ORDER — OXYCODONE-ACETAMINOPHEN 5-325 MG PO TABS
ORAL_TABLET | ORAL | 0 refills | Status: DC
  Filled 2021-09-09: qty 90, 45d supply, fill #0

## 2021-09-09 NOTE — Telephone Encounter (Signed)
Patient called and left a VM requesting pain medication be sent to [facility]. Request relayed to [employer], PA-C. Order placed and advised to call patient with update.  ? ?Returned patient's call and relayed above information. Patient verbalized understanding and appreciation of call. No other needs identified at this time ?

## 2021-09-23 ENCOUNTER — Other Ambulatory Visit (HOSPITAL_COMMUNITY): Payer: Self-pay

## 2021-09-23 ENCOUNTER — Ambulatory Visit (INDEPENDENT_AMBULATORY_CARE_PROVIDER_SITE_OTHER): Payer: Medicare Other | Admitting: Internal Medicine

## 2021-09-23 ENCOUNTER — Other Ambulatory Visit: Payer: Self-pay

## 2021-09-23 ENCOUNTER — Encounter: Payer: Self-pay | Admitting: Internal Medicine

## 2021-09-23 VITALS — BP 114/78 | HR 76 | Temp 98.5°F | Ht 71.0 in | Wt 174.3 lb

## 2021-09-23 DIAGNOSIS — F17210 Nicotine dependence, cigarettes, uncomplicated: Secondary | ICD-10-CM | POA: Diagnosis not present

## 2021-09-23 DIAGNOSIS — E1159 Type 2 diabetes mellitus with other circulatory complications: Secondary | ICD-10-CM

## 2021-09-23 DIAGNOSIS — E1142 Type 2 diabetes mellitus with diabetic polyneuropathy: Secondary | ICD-10-CM

## 2021-09-23 DIAGNOSIS — Z794 Long term (current) use of insulin: Secondary | ICD-10-CM | POA: Diagnosis not present

## 2021-09-23 DIAGNOSIS — G6289 Other specified polyneuropathies: Secondary | ICD-10-CM

## 2021-09-23 DIAGNOSIS — Z1211 Encounter for screening for malignant neoplasm of colon: Secondary | ICD-10-CM

## 2021-09-23 DIAGNOSIS — Z720 Tobacco use: Secondary | ICD-10-CM

## 2021-09-23 LAB — POCT GLYCOSYLATED HEMOGLOBIN (HGB A1C): Hemoglobin A1C: 8.2 % — AB (ref 4.0–5.6)

## 2021-09-23 LAB — GLUCOSE, CAPILLARY: Glucose-Capillary: 284 mg/dL — ABNORMAL HIGH (ref 70–99)

## 2021-09-23 MED ORDER — METFORMIN HCL 1000 MG PO TABS
1000.0000 mg | ORAL_TABLET | Freq: Two times a day (BID) | ORAL | 0 refills | Status: DC
  Filled 2021-09-23: qty 180, 90d supply, fill #0

## 2021-09-23 MED ORDER — SEMAGLUTIDE(0.25 OR 0.5MG/DOS) 2 MG/3ML ~~LOC~~ SOPN
0.2500 mg | PEN_INJECTOR | SUBCUTANEOUS | 2 refills | Status: DC
  Filled 2021-09-23: qty 3, 56d supply, fill #0
  Filled 2021-12-01: qty 3, 56d supply, fill #1
  Filled 2022-01-18: qty 3, 56d supply, fill #2

## 2021-09-23 NOTE — Progress Notes (Signed)
Subjective:   Patient ID: Jonathan Barker male   DOB: 02/01/1966 56 y.o.   MRN: [id number]  HPI: [name] is a 56 y.o. male with past medical history outlined below here for DM follow up. For the details of today's visit, please refer to the assessment and plan.   Past Medical History:  Diagnosis Date   ACS (acute coronary syndrome) ([place])    Astrocytoma of frontal lobe ([place]) 05/31/2013   Coronary artery disease    DM (diabetes mellitus) ([place]) 08/09/2011   A1c at diagnosis = 12.4   Family history of breast cancer    GERD (gastroesophageal reflux disease) 08/09/2011   H/O heart artery stent 07/14/2018   Hyperlipidemia    LAD stenosis    Non-ST elevation (NSTEMI) myocardial infarction ([place]) 07/15/2019   Nonsustained ventricular tachycardia ([facility])    NSVT (nonsustained ventricular tachycardia) ([facility])    Seizure ([place]) 05/19/2013   Shortness of breath    Current Outpatient Medications  Medication Sig Dispense Refill   metFORMIN (GLUCOPHAGE) 1000 MG tablet Take 1 tablet by mouth 2  times daily with a meal. 180 tablet 0   Semaglutide,0.25 or 0.5MG/DOS, 2 MG/3ML SOPN Inject 0.25 mg into the skin once a week. 3 mL 2   aspirin 81 MG chewable tablet Chew 1 tablet (81 mg total) by mouth daily. 90 tablet 3   atorvastatin (LIPITOR) 80 MG tablet TAKE 1 TABLET (80 MG TOTAL) BY MOUTH DAILY AT 6 PM. 90 tablet 2   blood glucose meter kit and supplies KIT Dispense based on patient and insurance preference. Use up to four times daily as directed. (FOR ICD-9 250.00, 250.01). 1 each 0   Blood Pressure Monitoring (BLOOD PRESSURE CUFF) MISC 1 each by Does not apply route daily as needed. 1 each 0   clopidogrel (PLAVIX) 75 MG tablet TAKE 1 TABLET BY MOUTH ONCE A DAY (NEEDS APPT) 90 tablet 3   gabapentin (NEURONTIN) 300 MG capsule TAKE 1 CAPSULE BY MOUTH AT BEDTIME 90 capsule 3   gabapentin (NEURONTIN) 300 MG capsule TAKE 1 CAPSULE BY MOUTH AT BEDTIME 90 capsule 0   Insulin Pen Needle (PEN NEEDLES)  32G X 4 MM MISC 1 pen by Does not apply route as directed. 100 each 11   magnesium oxide (MAG-OX) 400 (241.3 Mg) MG tablet Take 0.5 tablets (200 mg total) by mouth 2 (two) times daily. 90 tablet 1   metoprolol tartrate (LOPRESSOR) 50 MG tablet TAKE 1 TABLET TWICE DAILY- HOLD IF SYSTOLIC BP (TOP BLOOD PRESSURE NUMBER) LESS THAN 100 MMHG OR HEART RATE LESS THAN 60 BPM (PULSE). 180 tablet 2   nitroGLYCERIN (NITROSTAT) 0.4 MG SL tablet Place 1 tablet (0.4 mg total) under the tongue every 5 (five) minutes x 3 doses as needed for chest pain. 90 tablet 3   NON FORMULARY Take 1 tablet by mouth daily. OTC Nerve Vive     oxyCODONE-acetaminophen (PERCOCET/ROXICET) 5-325 MG tablet TAKE 1 TABLET BY MOUTH AT BEDTIME. MAY REPEAT 1 ADDITIONAL TABLET AS NEEDED 90 tablet 0   phenytoin (DILANTIN) 100 MG ER capsule Take 2 capsules every morning 180 capsule 3   sacubitril-valsartan (ENTRESTO) 24-26 MG Take 1 tablet by mouth 2 (two) times daily. 180 tablet 1   No current facility-administered medications for this visit.   Family History  Problem Relation Age of Onset   Breast cancer Mother        dx in her 62s-60s   Bone cancer Mother    Diabetes Father  Heart disease Father    Diabetes Brother    Diabetes Paternal Uncle    Social History   Socioeconomic History   Marital status: Single    Spouse name: Not on file   Number of children: 2   Years of education: 11   Highest education level: Not on file  Occupational History    Employer: [employer]  Tobacco Use   Smoking status: Every Day    Packs/day: 1.00    Years: 33.00    Pack years: 33.00    Types: Cigarettes   Smokeless tobacco: Never  Vaping Use   Vaping Use: Never used  Substance and Sexual Activity   Alcohol use: Yes    Comment: Beer sometimes.   Drug use: Yes    Types: Marijuana    Comment: Marijuana daily   Sexual activity: Not on file  Other Topics Concern   Not on file  Social History Narrative   [place] the  [place]. He does not work now.    Right handed      Highest level of edu- 11th grade      Lives in one story home. Steps to enter home   Social Determinants of Health   Financial Resource Strain: Not on file  Food Insecurity: Not on file  Transportation Needs: Not on file  Physical Activity: Not on file  Stress: Not on file  Social Connections: Not on file     Objective:  Physical Exam:  Vitals:   09/23/21 0944  BP: 114/78  Pulse: 76  Temp: 98.5 F (36.9 C)  TempSrc: Oral  SpO2: 98%  Weight: 174 lb 4.8 oz (79.1 kg)  Height: 5' 11"  (1.803 m)    Constitutional: NAD, well appearing  Cardiovascular: RRR, no m/r/g Pulmonary/Chest: clear bilaterally, normal effort  Extremities: feet are cool to touch, distal pulses 1+. Onychomycosis bilaterally.    Assessment & Plan:   Type II diabetes mellitus (Jonathan Barker) Chronic and uncontrolled, has been off all medical therapy for his diabetes. Unfortunately synjardy is no longer covered under his medical insurance and he does not remember being prescribed trulicity. Hgb A1c today is 8.2. He reports neuropathy in his bilateral big toes. On exam his feet are cool to touch but pulses are palpable. He has bilateral onychomycosis which is following with podiatry for. His neuropathy is well controlled with an OTC supplement called "nervive". It looks like this contains some B vitamins and alpha lipoic acid. I don't see any reason he can't continue this. Advised we work on glycemic control to prevent further nerve damage. He is agreeable to restarting metformin and a GLP-1 agonist. Will trial ozempic since it is once a week to help with compliance.  -- Restart metformin, given instructions on how to uptitrate to 1,000 mg BID -- Start ozempic 0.25 mg once weekly  -- Follow up 3 months   Tobacco use Advised cessation. Offer chantix which he declined. Bupropion contraindicated due to history seizure.   Screening for colon cancer Still has  his iFOBT kit at home which we ordered last year. Advised he complete and return this.

## 2021-09-23 NOTE — Assessment & Plan Note (Signed)
Still has his iFOBT kit at home which we ordered last year. Advised he complete and return this.

## 2021-09-23 NOTE — Patient Instructions (Signed)
Jonathan Barker,  It was a pleasure to see you. Please follow up with me again in 3 months.   If you have any questions or concerns, call our clinic at [phone] or after hours call [phone] and ask for the internal medicine resident on call.   Thank you!  Dr. Darnell Level

## 2021-09-23 NOTE — Assessment & Plan Note (Signed)
Advised cessation. Offer chantix which he declined. Bupropion contraindicated due to history seizure.

## 2021-09-23 NOTE — Assessment & Plan Note (Signed)
Chronic and uncontrolled, has been off all medical therapy for his diabetes. Unfortunately synjardy is no longer covered under his medical insurance and he does not remember being prescribed trulicity. Hgb A1c today is 8.2. He reports neuropathy in his bilateral big toes. On exam his feet are cool to touch but pulses are palpable. He has bilateral onychomycosis which is following with podiatry for. His neuropathy is well controlled with an OTC supplement called "nervive". It looks like this contains some B vitamins and alpha lipoic acid. I don't see any reason he can't continue this. Advised we work on glycemic control to prevent further nerve damage. He is agreeable to restarting metformin and a GLP-1 agonist. Will trial ozempic since it is once a week to help with compliance.  -- Restart metformin, given instructions on how to uptitrate to 1,000 mg BID -- Start ozempic 0.25 mg once weekly  -- Follow up 3 months

## 2021-09-29 ENCOUNTER — Other Ambulatory Visit (HOSPITAL_COMMUNITY): Payer: Self-pay

## 2021-10-20 ENCOUNTER — Other Ambulatory Visit: Payer: Self-pay | Admitting: Urology

## 2021-10-20 ENCOUNTER — Other Ambulatory Visit (HOSPITAL_COMMUNITY): Payer: Self-pay

## 2021-10-20 MED ORDER — OXYCODONE-ACETAMINOPHEN 5-325 MG PO TABS
ORAL_TABLET | ORAL | 0 refills | Status: DC
  Filled 2021-10-21: qty 90, 45d supply, fill #0

## 2021-10-21 ENCOUNTER — Other Ambulatory Visit (HOSPITAL_COMMUNITY): Payer: Self-pay

## 2021-11-13 ENCOUNTER — Other Ambulatory Visit (HOSPITAL_COMMUNITY): Payer: Self-pay

## 2021-12-01 ENCOUNTER — Other Ambulatory Visit: Payer: Self-pay | Admitting: Urology

## 2021-12-01 ENCOUNTER — Other Ambulatory Visit (HOSPITAL_COMMUNITY): Payer: Self-pay

## 2021-12-01 MED ORDER — OXYCODONE-ACETAMINOPHEN 5-325 MG PO TABS
ORAL_TABLET | ORAL | 0 refills | Status: DC
  Filled 2021-12-01: qty 90, 45d supply, fill #0

## 2021-12-02 ENCOUNTER — Other Ambulatory Visit (HOSPITAL_COMMUNITY): Payer: Self-pay

## 2021-12-15 ENCOUNTER — Telehealth: Payer: Self-pay

## 2021-12-23 ENCOUNTER — Encounter: Payer: Medicare Other | Admitting: Internal Medicine

## 2021-12-30 ENCOUNTER — Telehealth: Payer: Self-pay | Admitting: Internal Medicine

## 2021-12-30 NOTE — Telephone Encounter (Signed)
Called patient regarding 2024 appointment, patient is notified. 

## 2022-01-06 ENCOUNTER — Encounter: Payer: Medicare Other | Admitting: Internal Medicine

## 2022-01-08 ENCOUNTER — Ambulatory Visit (INDEPENDENT_AMBULATORY_CARE_PROVIDER_SITE_OTHER): Payer: Medicare Other | Admitting: Neurology

## 2022-01-08 ENCOUNTER — Other Ambulatory Visit (HOSPITAL_COMMUNITY): Payer: Self-pay

## 2022-01-08 ENCOUNTER — Encounter: Payer: Self-pay | Admitting: Neurology

## 2022-01-08 VITALS — BP 128/79 | HR 93 | Ht 71.0 in | Wt 173.2 lb

## 2022-01-08 DIAGNOSIS — E0842 Diabetes mellitus due to underlying condition with diabetic polyneuropathy: Secondary | ICD-10-CM | POA: Diagnosis not present

## 2022-01-08 DIAGNOSIS — G40109 Localization-related (focal) (partial) symptomatic epilepsy and epileptic syndromes with simple partial seizures, not intractable, without status epilepticus: Secondary | ICD-10-CM | POA: Diagnosis not present

## 2022-01-08 DIAGNOSIS — C719 Malignant neoplasm of brain, unspecified: Secondary | ICD-10-CM | POA: Diagnosis not present

## 2022-01-08 MED ORDER — GABAPENTIN 300 MG PO CAPS
300.0000 mg | ORAL_CAPSULE | Freq: Every day | ORAL | 3 refills | Status: DC
  Filled 2022-01-08 – 2022-02-15 (×2): qty 90, 90d supply, fill #0
  Filled 2022-05-18: qty 90, 90d supply, fill #1
  Filled 2022-08-10: qty 90, 90d supply, fill #2
  Filled 2022-10-18 – 2022-10-25 (×2): qty 90, 90d supply, fill #3
  Filled ????-??-??: fill #2

## 2022-01-08 MED ORDER — PHENYTOIN SODIUM EXTENDED 100 MG PO CAPS
ORAL_CAPSULE | ORAL | 3 refills | Status: DC
  Filled 2022-01-08: qty 180, 90d supply, fill #0
  Filled 2022-04-15: qty 180, 90d supply, fill #1
  Filled 2022-12-09: qty 180, 90d supply, fill #2

## 2022-01-08 NOTE — Patient Instructions (Signed)
Always good to see you.  Continue Dilantin '100mg'$ : take 2 capsules every morning  2. Continue Gabapentin '300mg'$ : take 1 capsule every bedtime  3. Follow-up in 1 year, call for any changes   Seizure Precautions: 1. If medication has been prescribed for you to prevent seizures, take it exactly as directed.  Do not stop taking the medicine without talking to your doctor first, even if you have not had a seizure in a long time.   2. Avoid activities in which a seizure would cause danger to yourself or to others.  Don't operate dangerous machinery, swim alone, or climb in high or dangerous places, such as on ladders, roofs, or girders.  Do not drive unless your doctor says you may.  3. If you have any warning that you may have a seizure, lay down in a safe place where you can't hurt yourself.    4.  No driving for 6 months from last seizure, as per [facility].   Please refer to the following link on the [place] website for more information: http://[email or web address].org/[email or web address]/driving/drivingu.cfm   5.  Maintain good sleep hygiene. Avoid alcohol.  6.  Contact your doctor if you have any problems that may be related to the medicine you are taking.  7.  Call 911 and bring the patient back to the ED if:        A.  The seizure lasts longer than 5 minutes.       B.  The patient doesn't awaken shortly after the seizure  C.  The patient has new problems such as difficulty seeing, speaking or moving  D.  The patient was injured during the seizure  E.  The patient has a temperature over 102 F (39C)  F.  The patient vomited and now is having trouble breathing

## 2022-01-08 NOTE — Progress Notes (Signed)
 NEUROLOGY FOLLOW UP OFFICE NOTE  Jonathan Barker [id number] 11/06/1965  HISTORY OF PRESENT ILLNESS: I had the pleasure of seeing Jonathan Barker in follow-up in the neurology clinic on 01/08/2022. He is alone in the office today. The patient was last seen a year ago for neuropathy and seizures secondary bifrontal grade II astrocytoma. He reports doing well seizure-free since 2015 on Dilantin 200mg every morning. He is also on Gabapentin 300mg qhs for neuropathy. He moved out of his daughter's home 3 months ago and lives alone with his dog. He denies any staring/unresponsive episodes, gaps in time, olfactory/gustatory hallucinations, focal numbness/tingling/weakness, myoclonic jerks. No headaches, dizziness, vision changes, no falls. He is sleeping well. He states neuropathy bothers him off and on, he feels Nervive has also helped. He is driving.   I personally reviewed his last brain MRI with and without contrast done 05/2021 which did not show any new changes with stable white matter signal more extensive on the right than left, previous right frontal craniotomy with right frontal resection. Diminishing small foci of enhancement in the right frontal region.    History on Initial Assessment 04/15/2015: This is a pleasant 56 yo RH man who had a new onset seizure last 05/18/13 and was found to have an infiltrative mass in the anteromedial right frontal lobe without enhancement, with FLAIR signal extending across the corpus callosum into the contralateral subcortical left frontal lobe. His daughter reports that he started blinking excessively, he recalls feeling his eyes "blinking at 100 mph," he tried to sit but could not because his left hand was locked inside his pocket. He was witnessed to have generalized shaking for less than a minute. He reports that he recalls the entire event, he could hear people screaming around him. His daughter reports that he was coherent right after the seizure, no  post-ictal confusion or focal weakness. He was brought to [facility] where imaging revealed the bifrontal mass, biopsy demonstrated diffuse grade 2 astrocytoma. Wake and drowsy EEG was normal. He took Temodar, then underwent radiotherapy until May 2015. He denies any further seizures since January, but does feel that he may have had a seizure the week after hospital discharge in January 2015, he woke up with his arms flexed, unsure if he had dreamed he had a seizure, no associated tongue bite or incontinence. His wife had mentioned around that time that she felt the bed shaking one night. Since January 2015, he denies any further eye blinking episodes, generalized shaking or stiffening of extremities, olfactory/gustatory hallucinations, deja vu, rising epigastric sensation, focal numbness/tingling/weakness, myoclonic jerks. He has had low Dilantin in levels in the past, and reports that he had only been taking it twice a day instead of TID, but with most recent level checked in November 2016 (<2.5), he reports taking Dilantin 100mg TID. He denies any side effects to the medication.  He denies any prior history of headaches until brain biopsy, and since then he has been having frequent headaches. Headaches are over the frontal region, with pressure sensation, no associated nausea, vomiting, photo/phonophobia. He reports headaches occur at the end of the day, 3-4 times a week, with good response to Percocet. He usually takes the medication and goes to bed, with no headache on awakening. He notices more headaches with cold weather. He denies any dizziness, vision changes, dysarthria/dysphagia, neck/back pain, focal numbness/tingling/weakness, bowel/bladder dysfunction. He usually has 4 alcoholic drinks a month. He had a normal birth and early development.  There is no history of   febrile convulsions, CNS infections such as meningitis/encephalitis, significant traumatic brain injury, or family history of seizures.  I  personally reviewed most recent MRI brain with and without contrast done 02/14/15 which showed abnormal FLAIR signal throughout the white matter of the frontal lobes, right more than left, unchanged, with small cystic area in the left frontal cortical/subcortical region that is noted to be slightly larger (42m) compared to last year. There is a punctate focus of enhancement in the right frontal white matter, 36mfocus of enhancement in the right basal ganglia/external capsule that is new.   PAST MEDICAL HISTORY: Past Medical History:  Diagnosis Date   ACS (acute coronary syndrome) ([facility]   [facility] of frontal lobe ([facility]2015   Coronary artery disease    DM (diabetes mellitus) ([facility]/12/2011   A1c at diagnosis = 12.4   Family history of breast cancer    GERD (gastroesophageal reflux disease) 08/09/2011   H/O heart artery stent 07/14/2018   Hyperlipidemia    LAD stenosis    Non-ST elevation (NSTEMI) myocardial infarction ([facility]20[place]   Nonsustained ventricular tachycardia ([facility])    [place]VT ([place]ustained [place] tachycardia) ([facility]   Seizure ([facility]/17/2015   Shortness of breath    Outpatient Encounter Medications as of 01/08/2022  Medication Sig   aspirin 81 MG chewable tablet Chew 1 tablet (81 mg total) by mouth daily.   atorvastatin (LIPITOR) 80 MG tablet TAKE 1 TABLET (80 MG TOTAL) BY MOUTH DAILY AT 6 PM.   blood glucose meter kit and supplies KIT Dispense based on patient and insurance preference. Use up to four times daily as directed. (FOR ICD-9 250.00, 250.01).   Blood Pressure Monitoring (BLOOD PRESSURE CUFF) MISC 1 each by Does not apply route daily as needed.   clopidogrel (PLAVIX) 75 MG tablet TAKE 1 TABLET BY MOUTH ONCE A DAY (NEEDS APPT)   Insulin Pen Needle (PEN NEEDLES) 32G X 4 MM MISC 1 pen by Does not apply route as directed.   metFORMIN (GLUCOPHAGE) 1000 MG tablet Take 1 tablet by mouth 2  times daily with a meal.   nitroGLYCERIN (NITROSTAT) 0.4 MG SL tablet Place  1 tablet (0.4 mg total) under the tongue every 5 (five) minutes x 3 doses as needed for chest pain.   NON FORMULARY Take 1 tablet by mouth daily. OTC Nerve Vive   oxyCODONE-acetaminophen (PERCOCET/ROXICET) 5-325 MG tablet TAKE 1 TABLET BY MOUTH AT BEDTIME. MAY REPEAT 1 ADDITIONAL TABLET AS NEEDED   phenytoin (DILANTIN) 100 MG ER capsule Take 2 capsules by mouth every morning   sacubitril-valsartan (ENTRESTO) 24-26 MG Take 1 tablet by mouth 2 (two) times daily.   Semaglutide,0.25 or 0.5MG/DOS, 2 MG/3ML SOPN Inject 0.25 mg into the skin once a week.                   gabapentin (NEURONTIN) 300 MG capsule Take 1 capsule (300 mg total) by mouth at bedtime.   metoprolol tartrate (LOPRESSOR) 50 MG tablet TAKE 1 TABLET TWICE DAILY- HOLD IF SYSTOLIC BP (TOP BLOOD PRESSURE NUMBER) LESS THAN 100 MMHG OR HEART RATE LESS THAN 60 BPM (PULSE).   No facility-administered encounter medications on file as of 01/08/2022.      ALLERGIES: No Known Allergies  FAMILY HISTORY: Family History  Problem Relation Age of Onset   Breast cancer Mother        dx in her 5073s-60s Bone cancer Mother    Diabetes Father    Heart disease Father  Diabetes Brother    Diabetes Paternal Uncle     SOCIAL HISTORY: Social History   Socioeconomic History   Marital status: Single    Spouse name: Not on file   Number of children: 2   Years of education: 11   Highest education level: Not on file  Occupational History    Employer: [employer]  Tobacco Use   Smoking status: Every Day    Packs/day: 1.00    Years: 33.00    Total pack years: 33.00    Types: Cigarettes   Smokeless tobacco: Never  Vaping Use   Vaping Use: Never used  Substance and Sexual Activity   Alcohol use: Yes    Comment: Beer sometimes.   Drug use: Yes    Types: Marijuana    Comment: Marijuana daily   Sexual activity: Not on file  Other Topics Concern   Not on file  Social History Narrative   [place]. He does not work now.    Right handed      Highest level of edu- 11th grade      Lives in one story home. Steps to enter home   Social Determinants of Health   Financial Resource Strain: Not on file  Food Insecurity: Not on file  Transportation Needs: Not on file  Physical Activity: Not on file  Stress: Not on file  Social Connections: Not on file  Intimate Partner Violence: Not on file     PHYSICAL EXAM: Vitals:   01/08/22 0813  BP: 128/79  Pulse: 93  SpO2: 97%   General: No acute distress Head:  Normocephalic/atraumatic Skin/Extremities: No rash, no edema Neurological Exam: alert and awake. No aphasia or dysarthria. Fund of knowledge is appropriate.  Attention and concentration are normal.   Cranial nerves: Pupils equal, round. Extraocular movements intact with no nystagmus. Visual fields full.  No facial asymmetry.  Motor: Bulk and tone normal, muscle strength 5/5 throughout with no pronator drift.   Finger to nose testing intact.  Gait narrow-based and steady, able to tandem walk adequately.  Romberg negative. No postural tremor. There is mild endpoint tremor bilaterally, L>R   IMPRESSION: This is a pleasant 55 yo RH man with a history of new onset seizure last January 2015 during which he was found to have a bifrontal (right greater than left) grade II astrocytoma. He underwent Temodar treatment and radiotherapy. He denies any headaches and continues to do well seizure-free since 2015 on Dilantin 200mg daily. He is also on Gabapentin 300mg qhs for neuropathy. Refills sent. His last MRI brain in 05/2021 was stable. He is aware of [place] driving laws to stop driving after a seizure until 6 months seizure-free. Follow-up in 1 year, call for any changes.   Thank you for allowing me to participate in his care.  Please do not hesitate to call for any questions or concerns.    Karen Aquino, M.D.   CC: Dr. Guilloud, Dr. Vaslow  

## 2022-01-11 ENCOUNTER — Other Ambulatory Visit: Payer: Self-pay | Admitting: Urology

## 2022-01-11 ENCOUNTER — Other Ambulatory Visit (HOSPITAL_COMMUNITY): Payer: Self-pay

## 2022-01-11 MED ORDER — OXYCODONE-ACETAMINOPHEN 5-325 MG PO TABS
ORAL_TABLET | ORAL | 0 refills | Status: DC
  Filled 2022-01-13: qty 90, 45d supply, fill #0
  Filled ????-??-??: fill #0

## 2022-01-12 ENCOUNTER — Ambulatory Visit (INDEPENDENT_AMBULATORY_CARE_PROVIDER_SITE_OTHER): Payer: Medicare Other

## 2022-01-12 ENCOUNTER — Other Ambulatory Visit (HOSPITAL_COMMUNITY): Payer: Self-pay

## 2022-01-12 DIAGNOSIS — Z0000 Encounter for general adult medical examination without abnormal findings: Secondary | ICD-10-CM | POA: Diagnosis not present

## 2022-01-12 NOTE — Progress Notes (Signed)
Subjective:   Jonathan Barker is a 56 y.o. male who presents for an Initial Medicare Annual Wellness Visit. I connected with  [name] on 01/13/22 by a audio enabled telemedicine application and verified that I am speaking with the correct person using two identifiers.  Patient Location: Home  Provider Location: Office/Clinic  I discussed the limitations of evaluation and management by telemedicine. The patient expressed understanding and agreed to proceed.   Review of Systems    DEFERRED TO PCP  Cardiac Risk Factors include: advanced age (>2mn, >>48women);diabetes mellitus;smoking/ tobacco exposure     Objective:    There were no vitals filed for this visit. There is no height or weight on file to calculate BMI.     01/12/2022    2:04 PM 01/08/2022    8:17 AM 09/23/2021    9:48 AM 01/08/2021    8:20 AM 09/24/2020    9:24 AM 08/05/2020    9:14 AM 02/05/2020    9:22 AM  Advanced Directives  Does Patient Have a Medical Advance Directive? No;Yes No No Yes Yes No No  Type of Advance Directive Living will   [place];Out of facility DNR (pink MOST or yellow form) [place]    Does patient want to make changes to medical advance directive?     No - Patient declined    Copy of HPutnamin Chart?     Yes - validated most recent copy scanned in chart (See row information)    Would patient like information on creating a medical advance directive?   No - Patient declined  No - Patient declined No - Patient declined No - Patient declined    Current Medications (verified) Outpatient Encounter Medications as of 01/12/2022  Medication Sig   aspirin 81 MG chewable tablet Chew 1 tablet (81 mg total) by mouth daily.   atorvastatin (LIPITOR) 80 MG tablet TAKE 1 TABLET (80 MG TOTAL) BY MOUTH DAILY AT 6 PM.   blood glucose meter kit and supplies KIT Dispense based on patient and insurance preference. Use up  to four times daily as directed. (FOR ICD-9 250.00, 250.01).   Blood Pressure Monitoring (BLOOD PRESSURE CUFF) MISC 1 each by Does not apply route daily as needed.   clopidogrel (PLAVIX) 75 MG tablet TAKE 1 TABLET BY MOUTH ONCE A DAY (NEEDS APPT)   gabapentin (NEURONTIN) 300 MG capsule Take 1 capsule (300 mg total) by mouth at bedtime.   Insulin Pen Needle (PEN NEEDLES) 32G X 4 MM MISC 1 pen by Does not apply route as directed.   metFORMIN (GLUCOPHAGE) 1000 MG tablet Take 1 tablet by mouth 2  times daily with a meal.   nitroGLYCERIN (NITROSTAT) 0.4 MG SL tablet Place 1 tablet (0.4 mg total) under the tongue every 5 (five) minutes x 3 doses as needed for chest pain.   NON FORMULARY Take 1 tablet by mouth daily. OTC Nerve Vive   oxyCODONE-acetaminophen (PERCOCET/ROXICET) 5-325 MG tablet TAKE 1 TABLET BY MOUTH AT BEDTIME. MAY REPEAT 1 ADDITIONAL TABLET AS NEEDED   phenytoin (DILANTIN) 100 MG ER capsule Take 2 capsules by mouth every morning   sacubitril-valsartan (ENTRESTO) 24-26 MG Take 1 tablet by mouth 2 (two) times daily.   Semaglutide,0.25 or 0.5MG/DOS, 2 MG/3ML SOPN Inject 0.25 mg into the skin once a week.   metoprolol tartrate (LOPRESSOR) 50 MG tablet TAKE 1 TABLET TWICE DAILY- HOLD IF SYSTOLIC BP (TOP BLOOD PRESSURE  NUMBER) LESS THAN 100 MMHG OR HEART RATE LESS THAN 60 BPM (PULSE).   No facility-administered encounter medications on file as of 01/12/2022.    Allergies (verified) Patient has no known allergies.   History: Past Medical History:  Diagnosis Date   ACS (acute coronary syndrome) ([place])    Astrocytoma of frontal lobe ([place]) 05/31/2013   Coronary artery disease    DM (diabetes mellitus) ([place]) 08/09/2011   A1c at diagnosis = 12.4   Family history of breast cancer    GERD (gastroesophageal reflux disease) 08/09/2011   H/O heart artery stent 07/14/2018   Hyperlipidemia    LAD stenosis    Non-ST elevation (NSTEMI) myocardial infarction ([place]) 07/15/2019   Nonsustained ventricular  tachycardia ([facility])    NSVT (nonsustained ventricular tachycardia) ([place])    Seizure ([facility]) 05/19/2013   Shortness of breath    Past Surgical History:  Procedure Laterality Date   APPENDECTOMY     BRAIN BIOPSY N/A 05/24/2013   Procedure: Craniotomy for open biopsy;  Surgeon: Winfield Cunas, MD;  Location: [facility];  Service: Neurosurgery;  Laterality: N/A;  Craniotomy for open biopsy   CARDIAC CATHETERIZATION     CORONARY ANGIOPLASTY WITH STENT PLACEMENT     CORONARY/GRAFT ACUTE MI REVASCULARIZATION N/A 07/15/2019   Procedure: Coronary/Graft Acute MI Revascularization;  Surgeon: Adrian Prows, MD;  Location: [facility];  Service: Cardiovascular;  Laterality: N/A;   LEFT HEART CATH AND CORONARY ANGIOGRAPHY N/A 07/15/2019   Procedure: LEFT HEART CATH AND CORONARY ANGIOGRAPHY;  Surgeon: Adrian Prows, MD;  Location: [facility];  Service: Cardiovascular;  Laterality: N/A;   Family History  Problem Relation Age of Onset   Breast cancer Mother        dx in her 56s-60s   Bone cancer Mother    Diabetes Father    Heart disease Father    Diabetes Brother    Diabetes Paternal Uncle    Social History   Socioeconomic History   Marital status: Single    Spouse name: Not on file   Number of children: 2   Years of education: 11   Highest education level: Not on file  Occupational History    Employer: [employer]  Tobacco Use   Smoking status: Every Day    Packs/day: 1.00    Years: 33.00    Total pack years: 33.00    Types: Cigarettes   Smokeless tobacco: Never  Vaping Use   Vaping Use: Never used  Substance and Sexual Activity   Alcohol use: Yes    Comment: Beer sometimes.   Drug use: Yes    Types: Marijuana    Comment: Marijuana daily   Sexual activity: Not on file  Other Topics Concern   Not on file  Social History Narrative   [place] [address] [place]. He does not work now.    Right handed      Highest level of edu- 11th grade      Lives in  one story home. Steps to enter home   Social Determinants of Health   Financial Resource Strain: High Risk (01/12/2022)   Overall Financial Resource Strain (CARDIA)    Difficulty of Paying Living Expenses: Hard  Food Insecurity: Food Insecurity Present (01/12/2022)   Hunger Vital Sign    Worried About Running Out of Food in the Last Year: Often true    Ran Out of Food in the Last Year: Often true  Transportation Needs: No Transportation Needs (  01/12/2022)   PRAPARE - Hydrologist (Medical): No    Lack of Transportation (Non-Medical): No  Physical Activity: Sufficiently Active (01/12/2022)   Exercise Vital Sign    Days of Exercise per Week: 7 days    Minutes of Exercise per Session: 40 min  Stress: No Stress Concern Present (01/12/2022)   [place]    Feeling of Stress : Not at all  Social Connections: Socially Isolated (01/12/2022)   Social Connection and Isolation Panel [NHANES]    Frequency of Communication with Friends and Family: More than three times a week    Frequency of Social Gatherings with Friends and Family: Once a week    Attends Religious Services: Never    Marine scientist or Organizations: No    Attends Music therapist: Never    Marital Status: Never married    Tobacco Counseling Ready to quit: Not Answered Counseling given: Not Answered   Clinical Intake:  Pre-visit preparation completed: Yes  Pain : No/denies pain     Diabetes: Yes  How often do you need to have someone help you when you read instructions, pamphlets, or other written materials from your doctor or pharmacy?: 1 - Never What is the last grade level you completed in school?: 11 GRADE  Diabetic? YES   Interpreter Needed?: No  Information entered by :: [facility] Margurette Brener   Activities of Daily Living    01/12/2022    2:05 PM 09/23/2021    9:47 AM  In your present state of  health, do you have any difficulty performing the following activities:  Hearing? 0 0  Vision? 0 0  Difficulty concentrating or making decisions? 0 0  Walking or climbing stairs? 0 0  Dressing or bathing? 0 0  Doing errands, shopping? 0 0  Preparing Food and eating ? N   Using the Toilet? N   In the past six months, have you accidently leaked urine? N   Do you have problems with loss of bowel control? N   Managing your Medications? N   Managing your Finances? N   Housekeeping or managing your Housekeeping? N     Patient Care Team: Velna Ochs, MD as PCP - General Delice Lesch Lezlie Octave, MD as Consulting Physician (Neurology)  Indicate any recent Medical Services you may have received from other than [facility] providers in the past year (date may be approximate).     Assessment:   This is a routine wellness examination for Ruth.  Hearing/Vision screen No results found.  Dietary issues and exercise activities discussed: Current Exercise Habits: Home exercise routine, Time (Minutes): 40, Frequency (Times/Week): 7, Weekly Exercise (Minutes/Week): 280, Intensity: Mild, Exercise limited by: None identified   Goals Addressed   None   Depression Screen    01/12/2022    2:02 PM 09/23/2021    9:47 AM 09/24/2020    9:22 AM 03/27/2018    2:25 PM 10/31/2017    2:36 PM 07/27/2017    4:01 PM 06/27/2017    4:04 PM  PHQ 2/9 Scores  PHQ - 2 Score 0 0 0 0 0 0 0  PHQ- 9 Score    1       Fall Risk    01/12/2022    2:04 PM 01/08/2022    8:17 AM 09/23/2021    9:47 AM 01/08/2021    8:20 AM 09/24/2020    9:22 AM  Fall Risk  Falls in the past year? 0 0 0 0 0  Number falls in past yr: 0 0 0 0   Injury with Fall? 0 0 0 0   Risk for fall due to : No Fall Risks  No Fall Risks  No Fall Risks  Follow up Falls evaluation completed;Falls prevention discussed  Falls evaluation completed;Falls prevention discussed  Falls prevention discussed    FALL RISK PREVENTION PERTAINING TO THE HOME:  Any stairs  in or around the home? Yes  If so, are there any without handrails? No  Home free of loose throw rugs in walkways, pet beds, electrical cords, etc? No  Adequate lighting in your home to reduce risk of falls? Yes   ASSISTIVE DEVICES UTILIZED TO PREVENT FALLS:  Life alert? No  Use of a cane, walker or w/c? No  Grab bars in the bathroom? No  Shower chair or bench in shower? No  Elevated toilet seat or a handicapped toilet? No   TIMED UP AND GO:  Was the test performed? No .  Length of time to ambulate 10 feet: N/A sec.     Cognitive Function:        01/12/2022    2:07 PM  6CIT Screen  What Year? 0 points  What month? 0 points  What time? 0 points  Count back from 20 0 points  Months in reverse 0 points  Repeat phrase 0 points  Total Score 0 points    Immunizations Immunization History  Administered Date(s) Administered   Pneumococcal Polysaccharide-23 09/09/2011   Td 09/09/2011    TDAP status: Due, Education has been provided regarding the importance of this vaccine. Advised may receive this vaccine at local pharmacy or Health Dept. Aware to provide a copy of the vaccination record if obtained from local pharmacy or Health Dept. Verbalized acceptance and understanding.  Flu Vaccine status: Due, Education has been provided regarding the importance of this vaccine. Advised may receive this vaccine at local pharmacy or Health Dept. Aware to provide a copy of the vaccination record if obtained from local pharmacy or Health Dept. Verbalized acceptance and understanding.  Pneumococcal vaccine status: Due, Education has been provided regarding the importance of this vaccine. Advised may receive this vaccine at local pharmacy or Health Dept. Aware to provide a copy of the vaccination record if obtained from local pharmacy or Health Dept. Verbalized acceptance and understanding.  Covid-19 vaccine status: Information provided on how to obtain vaccines.   Qualifies for Shingles  Vaccine? Yes   Zostavax completed No   Shingrix Completed?: No.    Education has been provided regarding the importance of this vaccine. Patient has been advised to call insurance company to determine out of pocket expense if they have not yet received this vaccine. Advised may also receive vaccine at local pharmacy or Health Dept. Verbalized acceptance and understanding.  Screening Tests Health Maintenance  Topic Date Due   COVID-19 Vaccine (1) Never done   OPHTHALMOLOGY EXAM  Never done   HIV Screening  Never done   Hepatitis C Screening  Never done   Zoster Vaccines- Shingrix (1 of 2) Never done   COLON CANCER SCREENING ANNUAL FOBT  Never done   COLONOSCOPY (Pts 45-88yr Insurance coverage will need to be confirmed)  Never done   Diabetic kidney evaluation - Urine ACR  01/18/2017   TETANUS/TDAP  09/08/2021   INFLUENZA VACCINE  Never done   HEMOGLOBIN A1C  03/26/2022   Diabetic kidney evaluation - GFR measurement  03/30/2022   FOOT EXAM  09/24/2022   HPV VACCINES  Aged Out    Health Maintenance  Health Maintenance Due  Topic Date Due   COVID-19 Vaccine (1) Never done   OPHTHALMOLOGY EXAM  Never done   HIV Screening  Never done   Hepatitis C Screening  Never done   Zoster Vaccines- Shingrix (1 of 2) Never done   COLON CANCER SCREENING ANNUAL FOBT  Never done   COLONOSCOPY (Pts 45-6yr Insurance coverage will need to be confirmed)  Never done   Diabetic kidney evaluation - Urine ACR  01/18/2017   TETANUS/TDAP  09/08/2021   INFLUENZA VACCINE  Never done   Colorectal cancer screening: Referral to GI placed DEFERRED TO PCP .   Lung Cancer Screening: (Low Dose CT Chest recommended if Age 55-80years, 30 pack-year currently smoking OR have quit w/in 15years.) does qualify.   Lung Cancer Screening Referral: DEFERRED TO PCP  Additional Screening:  Hepatitis C Screening: does not qualify; Completed DEFERRED TO PCP   Vision Screening: Recommended annual ophthalmology exams for  early detection of glaucoma and other disorders of the eye. Is the patient up to date with their annual eye exam?  Yes  Who is the provider or what is the name of the office in which the patient attends annual eye exams? [facility]  If pt is not established with a provider, would they like to be referred to a provider to establish care? No .   Dental Screening: Recommended annual dental exams for proper oral hygiene  Community Resource Referral / Chronic Care Management: CRR required this visit?  No   CCM required this visit?  No      Plan:     I have personally reviewed and noted the following in the patient's chart:   Medical and social history Use of alcohol, tobacco or illicit drugs  Current medications and supplements including opioid prescriptions. Patient is currently taking opioid prescriptions. Information provided to patient regarding non-opioid alternatives. Patient advised to discuss non-opioid treatment plan with their provider. Functional ability and status Nutritional status Physical activity Advanced directives List of other physicians Hospitalizations, surgeries, and ER visits in previous 12 months Vitals Screenings to include cognitive, depression, and falls Referrals and appointments  In addition, I have reviewed and discussed with patient certain preventive protocols, quality metrics, and best practice recommendations. A written personalized care plan for preventive services as well as general preventive health recommendations were provided to patient.     aJudyann Munson CMA   01/13/2022   Nurse Notes: NON FACE TO FACE   Mr. WTonkinson, Thank you for taking time to come for your Medicare Wellness Visit. I appreciate your ongoing commitment to your health goals. Please review the following plan we discussed and let me know if I can assist you in the future.   These are the goals we discussed:  Goals      Blood Pressure < 140/90     HEMOGLOBIN A1C  < 7.0     LDL CALC < 100     LDL CALC < 100        This is a list of the screening recommended for you and due dates:  Health Maintenance  Topic Date Due   COVID-19 Vaccine (1) Never done   Eye exam for diabetics  Never done   HIV Screening  Never done   Hepatitis C Screening: USPSTF Recommendation to screen - Ages 183-79yo.  Never done  Zoster (Shingles) Vaccine (1 of 2) Never done   Stool Blood Test  Never done   Colon Cancer Screening  Never done   Yearly kidney health urinalysis for diabetes  01/18/2017   Tetanus Vaccine  09/08/2021   Flu Shot  Never done   Hemoglobin A1C  03/26/2022   Yearly kidney function blood test for diabetes  03/30/2022   Complete foot exam   09/24/2022   HPV Vaccine  Aged Out

## 2022-01-12 NOTE — Patient Instructions (Signed)
Health Maintenance, Male Adopting a healthy lifestyle and getting preventive care are important in promoting health and wellness. Ask your health care provider about: The right schedule for you to have regular tests and exams. Things you can do on your own to prevent diseases and keep yourself healthy. What should I know about diet, weight, and exercise? Eat a healthy diet  Eat a diet that includes plenty of vegetables, fruits, low-fat dairy products, and lean protein. Do not eat a lot of foods that are high in solid fats, added sugars, or sodium. Maintain a healthy weight Body mass index (BMI) is a measurement that can be used to identify possible weight problems. It estimates body fat based on height and weight. Your health care provider can help determine your BMI and help you achieve or maintain a healthy weight. Get regular exercise Get regular exercise. This is one of the most important things you can do for your health. Most adults should: Exercise for at least 150 minutes each week. The exercise should increase your heart rate and make you sweat (moderate-intensity exercise). Do strengthening exercises at least twice a week. This is in addition to the moderate-intensity exercise. Spend less time sitting. Even light physical activity can be beneficial. Watch cholesterol and blood lipids Have your blood tested for lipids and cholesterol at 55 years of age, then have this test every 5 years. You may need to have your cholesterol levels checked more often if: Your lipid or cholesterol levels are high. You are older than 55 years of age. You are at high risk for heart disease. What should I know about cancer screening? Many types of cancers can be detected early and may often be prevented. Depending on your health history and family history, you may need to have cancer screening at various ages. This may include screening for: Colorectal cancer. Prostate cancer. Skin cancer. Lung  cancer. What should I know about heart disease, diabetes, and high blood pressure? Blood pressure and heart disease High blood pressure causes heart disease and increases the risk of stroke. This is more likely to develop in people who have high blood pressure readings or are overweight. Talk with your health care provider about your target blood pressure readings. Have your blood pressure checked: Every 3-5 years if you are 18-39 years of age. Every year if you are 40 years old or older. If you are between the ages of 65 and 75 and are a current or former smoker, ask your health care provider if you should have a one-time screening for abdominal aortic aneurysm (AAA). Diabetes Have regular diabetes screenings. This checks your fasting blood sugar level. Have the screening done: Once every three years after age 45 if you are at a normal weight and have a low risk for diabetes. More often and at a younger age if you are overweight or have a high risk for diabetes. What should I know about preventing infection? Hepatitis B If you have a higher risk for hepatitis B, you should be screened for this virus. Talk with your health care provider to find out if you are at risk for hepatitis B infection. Hepatitis C Blood testing is recommended for: Everyone born from 1945 through 1965. Anyone with known risk factors for hepatitis C. Sexually transmitted infections (STIs) You should be screened each year for STIs, including gonorrhea and chlamydia, if: You are sexually active and are younger than 55 years of age. You are older than 55 years of age and your   health care provider tells you that you are at risk for this type of infection. Your sexual activity has changed since you were last screened, and you are at increased risk for chlamydia or gonorrhea. Ask your health care provider if you are at risk. Ask your health care provider about whether you are at high risk for HIV. Your health care provider  may recommend a prescription medicine to help prevent HIV infection. If you choose to take medicine to prevent HIV, you should first get tested for HIV. You should then be tested every 3 months for as long as you are taking the medicine. Follow these instructions at home: Alcohol use Do not drink alcohol if your health care provider tells you not to drink. If you drink alcohol: Limit how much you have to 0-2 drinks a day. Know how much alcohol is in your drink. In the [place] one drink equals one 12 oz bottle of beer (355 mL), one 5 oz glass of wine (148 mL), or one 1 oz glass of hard liquor (44 mL). Lifestyle Do not use any products that contain nicotine or tobacco. These products include cigarettes, chewing tobacco, and vaping devices, such as e-cigarettes. If you need help quitting, ask your health care provider. Do not use street drugs. Do not share needles. Ask your health care provider for help if you need support or information about quitting drugs. General instructions Schedule regular health, dental, and eye exams. Stay current with your vaccines. Tell your health care provider if: You often feel depressed. You have ever been abused or do not feel safe at home. Summary Adopting a healthy lifestyle and getting preventive care are important in promoting health and wellness. Follow your health care provider's instructions about healthy diet, exercising, and getting tested or screened for diseases. Follow your health care provider's instructions on monitoring your cholesterol and blood pressure. This information is not intended to replace advice given to you by your health care provider. Make sure you discuss any questions you have with your health care provider. Document Revised: 09/08/2020 Document Reviewed: 09/08/2020 Elsevier Patient Education  2023 [employer]  

## 2022-01-13 ENCOUNTER — Other Ambulatory Visit (HOSPITAL_COMMUNITY): Payer: Self-pay

## 2022-01-18 ENCOUNTER — Other Ambulatory Visit (HOSPITAL_COMMUNITY): Payer: Self-pay

## 2022-01-20 ENCOUNTER — Ambulatory Visit (INDEPENDENT_AMBULATORY_CARE_PROVIDER_SITE_OTHER): Payer: Medicare Other | Admitting: Internal Medicine

## 2022-01-20 ENCOUNTER — Other Ambulatory Visit: Payer: Self-pay

## 2022-01-20 ENCOUNTER — Encounter: Payer: Self-pay | Admitting: Internal Medicine

## 2022-01-20 ENCOUNTER — Other Ambulatory Visit (HOSPITAL_COMMUNITY): Payer: Self-pay

## 2022-01-20 VITALS — BP 120/77 | HR 83 | Temp 97.7°F | Ht 71.0 in | Wt 175.9 lb

## 2022-01-20 DIAGNOSIS — Z794 Long term (current) use of insulin: Secondary | ICD-10-CM | POA: Diagnosis not present

## 2022-01-20 DIAGNOSIS — Z1159 Encounter for screening for other viral diseases: Secondary | ICD-10-CM | POA: Diagnosis not present

## 2022-01-20 DIAGNOSIS — Z720 Tobacco use: Secondary | ICD-10-CM

## 2022-01-20 DIAGNOSIS — E1159 Type 2 diabetes mellitus with other circulatory complications: Secondary | ICD-10-CM | POA: Diagnosis not present

## 2022-01-20 DIAGNOSIS — F17210 Nicotine dependence, cigarettes, uncomplicated: Secondary | ICD-10-CM | POA: Diagnosis not present

## 2022-01-20 DIAGNOSIS — I5022 Chronic systolic (congestive) heart failure: Secondary | ICD-10-CM

## 2022-01-20 LAB — POCT GLYCOSYLATED HEMOGLOBIN (HGB A1C): Hemoglobin A1C: 7.4 % — AB (ref 4.0–5.6)

## 2022-01-20 LAB — GLUCOSE, CAPILLARY: Glucose-Capillary: 189 mg/dL — ABNORMAL HIGH (ref 70–99)

## 2022-01-20 MED ORDER — SEMAGLUTIDE(0.25 OR 0.5MG/DOS) 2 MG/3ML ~~LOC~~ SOPN
0.5000 mg | PEN_INJECTOR | SUBCUTANEOUS | 2 refills | Status: DC
  Filled 2022-01-20: qty 3, fill #0
  Filled 2022-02-15: qty 3, 28d supply, fill #0
  Filled 2022-03-12: qty 3, 28d supply, fill #1
  Filled 2022-04-15: qty 3, 28d supply, fill #2

## 2022-01-20 NOTE — Assessment & Plan Note (Signed)
Euvolemic and asymptomatic. BP is normotensive. Doing well on entresto and metoprolol. Follows with cardiology.

## 2022-01-20 NOTE — Progress Notes (Signed)
Subjective:   Patient ID: Jonathan Barker male   DOB: [birth year] 56 y.o.   MRN: [id number]  HPI: Jonathan Barker is a 56 y.o. male with past medical history outlined below here for diabetes follow up. For the details of today's visit, please refer to the assessment and plan.  Past Medical History:  Diagnosis Date   ACS (acute coronary syndrome) ([place])    Astrocytoma of frontal lobe ([place]) 05/31/2013   Coronary artery disease    DM (diabetes mellitus) ([place]) 08/09/2011   A1c at diagnosis = 12.4   Family history of breast cancer    GERD (gastroesophageal reflux disease) 08/09/2011   H/O heart artery stent 07/14/2018   Hyperlipidemia    LAD stenosis    Non-ST elevation (NSTEMI) myocardial infarction ([place]) 07/15/2019   Nonsustained ventricular tachycardia (HCC)    NSVT (nonsustained ventricular tachycardia) (HCC)    Seizure ([place]) 05/19/2013   Shortness of breath    Current Outpatient Medications  Medication Sig Dispense Refill   aspirin 81 MG chewable tablet Chew 1 tablet (81 mg total) by mouth daily. 90 tablet 3   atorvastatin (LIPITOR) 80 MG tablet TAKE 1 TABLET (80 MG TOTAL) BY MOUTH DAILY AT 6 PM. 90 tablet 2   blood glucose meter kit and supplies KIT Dispense based on patient and insurance preference. Use up to four times daily as directed. (FOR ICD-9 250.00, 250.01). 1 each 0   Blood Pressure Monitoring (BLOOD PRESSURE CUFF) MISC 1 each by Does not apply route daily as needed. 1 each 0   clopidogrel (PLAVIX) 75 MG tablet TAKE 1 TABLET BY MOUTH ONCE A DAY (NEEDS APPT) 90 tablet 3   gabapentin (NEURONTIN) 300 MG capsule Take 1 capsule (300 mg total) by mouth at bedtime. 90 capsule 3   Insulin Pen Needle (PEN NEEDLES) 32G X 4 MM MISC 1 pen by Does not apply route as directed. 100 each 11   metoprolol tartrate (LOPRESSOR) 50 MG tablet TAKE 1 TABLET TWICE DAILY- HOLD IF SYSTOLIC BP (TOP BLOOD PRESSURE NUMBER) LESS THAN 100 MMHG OR HEART RATE LESS THAN 60 BPM (PULSE). 180 tablet 2    nitroGLYCERIN (NITROSTAT) 0.4 MG SL tablet Place 1 tablet (0.4 mg total) under the tongue every 5 (five) minutes x 3 doses as needed for chest pain. 90 tablet 3   NON FORMULARY Take 1 tablet by mouth daily. OTC Nerve Vive     oxyCODONE-acetaminophen (PERCOCET/ROXICET) 5-325 MG tablet TAKE 1 TABLET BY MOUTH AT BEDTIME. MAY REPEAT 1 ADDITIONAL TABLET AS NEEDED 90 tablet 0   phenytoin (DILANTIN) 100 MG ER capsule Take 2 capsules by mouth every morning 180 capsule 3   sacubitril-valsartan (ENTRESTO) 24-26 MG Take 1 tablet by mouth 2 (two) times daily. 180 tablet 1   Semaglutide,0.25 or 0.5MG/DOS, 2 MG/3ML SOPN Inject 0.5 mg into the skin once a week. 3 mL 2   No current facility-administered medications for this visit.   Family History  Problem Relation Age of Onset   Breast cancer Mother        dx in her 16s-60s   Bone cancer Mother    Diabetes Father    Heart disease Father    Diabetes Brother    Diabetes Paternal Uncle    Social History   Socioeconomic History   Marital status: Single    Spouse name: Not on file   Number of children: 2   Years of education: 11   Highest education level: Not on file  Occupational  History    Employer: [employer]  Tobacco Use   Smoking status: Every Day    Packs/day: 1.00    Years: 33.00    Total pack years: 33.00    Types: Cigarettes   Smokeless tobacco: Never  Vaping Use   Vaping Use: Never used  Substance and Sexual Activity   Alcohol use: Not Currently   Drug use: Yes    Types: Marijuana    Comment: Marijuana daily   Sexual activity: Not on file  Other Topics Concern   Not on file  Social History Narrative   [place] the [place]. He does not work now.    Right handed      Highest level of edu- 11th grade      Lives in one story home. Steps to enter home   Social Determinants of Health   Financial Resource Strain: High Risk (01/12/2022)   Overall Financial Resource Strain (CARDIA)    Difficulty of  Paying Living Expenses: Hard  Food Insecurity: Food Insecurity Present (01/12/2022)   Hunger Vital Sign    Worried About Running Out of Food in the Last Year: Often true    Ran Out of Food in the Last Year: Often true  Transportation Needs: No Transportation Needs (01/12/2022)   PRAPARE - Hydrologist (Medical): No    Lack of Transportation (Non-Medical): No  Physical Activity: Sufficiently Active (01/12/2022)   Exercise Vital Sign    Days of Exercise per Week: 7 days    Minutes of Exercise per Session: 40 min  Stress: No Stress Concern Present (01/12/2022)   [place]    Feeling of Stress : Not at all  Social Connections: Socially Isolated (01/12/2022)   Social Connection and Isolation Panel [NHANES]    Frequency of Communication with Friends and Family: More than three times a week    Frequency of Social Gatherings with Friends and Family: Once a week    Attends Religious Services: Never    Marine scientist or Organizations: No    Attends Music therapist: Never    Marital Status: Never married    Objective:  Physical Exam:  Vitals:   01/20/22 0947  BP: 120/77  Pulse: 83  Temp: 97.7 F (36.5 C)  TempSrc: Oral  SpO2: 99%  Weight: 175 lb 14.4 oz (79.8 kg)  Height: _0  (1.803 m)    Constitutional: NAD, well appearing  Cardiovascular: RRR, no m/r/g Pulmonary/Chest: Clear bilaterally, normal effort Extremities: Warm, no edema    Assessment & Plan:   Type II diabetes mellitus (HCC) Hgb A1c improved today to 7.4, near goal. He has not been taking metformin. Was confused about instructions from last visit but has been taking ozempic 0.25 mg weekly without issues. No side effects. Since he is doing well on this we discussed increasing to the 0.5 mg dose to see if we can get him to goal on a single agent. His weight is stable, BMI is normal. I have sent a new Rx  for 0.5 mg ozempic to his pharmacy, follow up in 3 months for a repeat Hgb A1c.   Tobacco use Still smoking, counseled on cessation.   Need for hepatitis C screening test Checking Hep C antibody   Chronic HFrEF (heart failure with reduced ejection fraction) (HCC) Euvolemic and asymptomatic. BP is normotensive. Doing well on entresto and metoprolol. Follows with cardiology.

## 2022-01-20 NOTE — Assessment & Plan Note (Signed)
Still smoking, counseled on cessation.

## 2022-01-20 NOTE — Assessment & Plan Note (Signed)
Hgb A1c improved today to 7.4, near goal. He has not been taking metformin. Was confused about instructions from last visit but has been taking ozempic 0.25 mg weekly without issues. No side effects. Since he is doing well on this we discussed increasing to the 0.5 mg dose to see if we can get him to goal on a single agent. His weight is stable, BMI is normal. I have sent a new Rx for 0.5 mg ozempic to his pharmacy, follow up in 3 months for a repeat Hgb A1c.

## 2022-01-20 NOTE — Patient Instructions (Signed)
MR. Szatkowski,  It was a pleasure to see you. Keep up the good work with your diabetes. I have increased your ozempic to 0.5 mg weekly. Continue to hold off on metformin. Follow up with me again in 3 months to have your Hgb A1c rechecked.   If you have any questions or concerns, call our clinic at [phone] or after hours call [phone] and ask for the internal medicine resident on call.   Thank you!  Dr. Darnell Level

## 2022-01-20 NOTE — Assessment & Plan Note (Signed)
Checking Hep C antibody

## 2022-01-21 LAB — BMP8+ANION GAP
Anion Gap: 13 mmol/L (ref 10.0–18.0)
BUN/Creatinine Ratio: 12 (ref 9–20)
BUN: 9 mg/dL (ref 6–24)
CO2: 22 mmol/L (ref 20–29)
Calcium: 9.5 mg/dL (ref 8.7–10.2)
Chloride: 104 mmol/L (ref 96–106)
Creatinine, Ser: 0.73 mg/dL — ABNORMAL LOW (ref 0.76–1.27)
Glucose: 158 mg/dL — ABNORMAL HIGH (ref 70–99)
Potassium: 3.9 mmol/L (ref 3.5–5.2)
Sodium: 139 mmol/L (ref 134–144)
eGFR: 108 mL/min/{1.73_m2} (ref >59)

## 2022-01-21 LAB — HCV INTERPRETATION

## 2022-01-21 LAB — HCV AB W REFLEX TO QUANT PCR: HCV Ab: NONREACTIVE

## 2022-01-27 NOTE — Progress Notes (Signed)
Internal Medicine Clinic Attending  Case and documentation was reviewed.  I reviewed the AWV findings.  I agree with the assessment, diagnosis, and plan of care documented in the AWV note.

## 2022-02-15 ENCOUNTER — Other Ambulatory Visit (HOSPITAL_COMMUNITY): Payer: Self-pay

## 2022-02-15 ENCOUNTER — Other Ambulatory Visit: Payer: Self-pay | Admitting: Internal Medicine

## 2022-02-15 DIAGNOSIS — Z794 Long term (current) use of insulin: Secondary | ICD-10-CM

## 2022-02-16 ENCOUNTER — Other Ambulatory Visit: Payer: Self-pay | Admitting: Urology

## 2022-02-16 ENCOUNTER — Other Ambulatory Visit (HOSPITAL_COMMUNITY): Payer: Self-pay

## 2022-02-24 ENCOUNTER — Other Ambulatory Visit (HOSPITAL_COMMUNITY): Payer: Self-pay

## 2022-02-24 MED ORDER — OXYCODONE-ACETAMINOPHEN 5-325 MG PO TABS
1.0000 | ORAL_TABLET | ORAL | 0 refills | Status: DC
  Filled 2022-02-24: qty 60, 30d supply, fill #0

## 2022-02-24 NOTE — Telephone Encounter (Signed)
Please let patient know that the refill has been sent to [place].

## 2022-02-25 ENCOUNTER — Other Ambulatory Visit (HOSPITAL_COMMUNITY): Payer: Self-pay

## 2022-03-12 ENCOUNTER — Other Ambulatory Visit (HOSPITAL_COMMUNITY): Payer: Self-pay

## 2022-03-15 ENCOUNTER — Other Ambulatory Visit (HOSPITAL_COMMUNITY): Payer: Self-pay

## 2022-03-19 ENCOUNTER — Ambulatory Visit: Payer: Medicare HMO | Admitting: Cardiology

## 2022-03-24 ENCOUNTER — Other Ambulatory Visit: Payer: Self-pay | Admitting: Cardiology

## 2022-03-24 ENCOUNTER — Other Ambulatory Visit (HOSPITAL_COMMUNITY): Payer: Self-pay

## 2022-03-24 MED ORDER — METOPROLOL TARTRATE 50 MG PO TABS
50.0000 mg | ORAL_TABLET | Freq: Two times a day (BID) | ORAL | 2 refills | Status: DC
  Filled 2022-03-24: qty 180, 90d supply, fill #0
  Filled 2022-11-15: qty 180, 90d supply, fill #1
  Filled 2023-02-07 – 2023-02-14 (×2): qty 180, 90d supply, fill #2

## 2022-03-31 ENCOUNTER — Other Ambulatory Visit (HOSPITAL_COMMUNITY): Payer: Self-pay

## 2022-04-08 ENCOUNTER — Other Ambulatory Visit (HOSPITAL_COMMUNITY): Payer: Self-pay

## 2022-04-08 ENCOUNTER — Other Ambulatory Visit: Payer: Self-pay | Admitting: Urology

## 2022-04-08 MED ORDER — OXYCODONE-ACETAMINOPHEN 5-325 MG PO TABS
1.0000 | ORAL_TABLET | ORAL | 0 refills | Status: DC
  Filled 2022-04-08: qty 60, 30d supply, fill #0

## 2022-04-09 ENCOUNTER — Encounter: Payer: Self-pay | Admitting: Cardiology

## 2022-04-09 ENCOUNTER — Telehealth: Payer: Self-pay

## 2022-04-09 ENCOUNTER — Ambulatory Visit: Payer: Medicare HMO | Admitting: Cardiology

## 2022-04-09 VITALS — BP 105/76 | HR 85 | Ht 71.0 in | Wt 173.8 lb

## 2022-04-09 DIAGNOSIS — Z720 Tobacco use: Secondary | ICD-10-CM

## 2022-04-09 DIAGNOSIS — I255 Ischemic cardiomyopathy: Secondary | ICD-10-CM

## 2022-04-09 DIAGNOSIS — E782 Mixed hyperlipidemia: Secondary | ICD-10-CM

## 2022-04-09 DIAGNOSIS — E1159 Type 2 diabetes mellitus with other circulatory complications: Secondary | ICD-10-CM | POA: Diagnosis not present

## 2022-04-09 DIAGNOSIS — I252 Old myocardial infarction: Secondary | ICD-10-CM | POA: Diagnosis not present

## 2022-04-09 DIAGNOSIS — C711 Malignant neoplasm of frontal lobe: Secondary | ICD-10-CM

## 2022-04-09 DIAGNOSIS — I4729 Other ventricular tachycardia: Secondary | ICD-10-CM

## 2022-04-09 DIAGNOSIS — I2510 Atherosclerotic heart disease of native coronary artery without angina pectoris: Secondary | ICD-10-CM | POA: Diagnosis not present

## 2022-04-09 DIAGNOSIS — Z794 Long term (current) use of insulin: Secondary | ICD-10-CM

## 2022-04-09 DIAGNOSIS — Z95820 Peripheral vascular angioplasty status with implants and grafts: Secondary | ICD-10-CM

## 2022-04-09 NOTE — Progress Notes (Signed)
Jonathan Barker Date of Birth: February 27, 1966 MRN: [id number] Primary Care Provider:Guilloud, Hoyle Sauer, MD Former Cardiology Providers: Dr. Adrian Prows Primary Cardiologist: Rex Kras, DO (March 2021)  Date: 04/09/22 Last Office Visit: 03/19/2021   Chief Complaint  Patient presents with   Follow-up    1 year   Coronary artery disease involving native coronary artery of   Cardiomyopathy   HPI  SUNDEEP Barker is a 56 y.o. male whose past medical history and cardiac risk factors include: Hx of non-STEMI, established coronary artery disease with PCI, ischemic cardiomyopathy, hypertension, hyperlipidemia, insulin-dependent diabetes mellitus type 2, history of nonsustained ventricular tachycardia, active tobacco use, history of astrocytoma.  In March 2021 he presented to the hospital with chest pain, ruled in for NSTEMI.  He underwent angiography and was noted to have obstructive CAD and successfully underwent PTCA and stenting of the LAD as outlined below.  He presents today for 1 year follow-up visit.  Patient denies anginal discomfort or heart failure symptoms.  Overall functional capacity remains relatively stable.  Unfortunately he was smoking 0.5 packs/day and now is up to 1 pack/day.  He has tried to quit smoking cold [place] but has been unsuccessful.  And he is hesitant with regards to pharmacological therapy given the potential for seizures.  ALLERGIES: No Known Allergies   MEDICATION LIST PRIOR TO VISIT: Current Outpatient Medications on File Prior to Visit  Medication Sig Dispense Refill   aspirin 81 MG chewable tablet Chew 1 tablet (81 mg total) by mouth daily. 90 tablet 3   atorvastatin (LIPITOR) 80 MG tablet TAKE 1 TABLET (80 MG TOTAL) BY MOUTH DAILY AT 6 PM. 90 tablet 2   blood glucose meter kit and supplies KIT Dispense based on patient and insurance preference. Use up to four times daily as directed. (FOR ICD-9 250.00, 250.01). 1 each 0   Blood Pressure Monitoring  (BLOOD PRESSURE CUFF) MISC 1 each by Does not apply route daily as needed. 1 each 0   clopidogrel (PLAVIX) 75 MG tablet TAKE 1 TABLET BY MOUTH ONCE A DAY (NEEDS APPT) 90 tablet 3   gabapentin (NEURONTIN) 300 MG capsule Take 1 capsule (300 mg total) by mouth at bedtime. 90 capsule 3   Insulin Pen Needle (PEN NEEDLES) 32G X 4 MM MISC 1 pen by Does not apply route as directed. 100 each 11   metoprolol tartrate (LOPRESSOR) 50 MG tablet Take 1 tablet (50 mg total) by mouth 2 (two) times daily.HOLD IF SYSTOLIC BP (TOP BLOOD PRESSURE NUMBER) LESS THAN 100 MMHG OR HEART RATE LESS THAN 60 BPM (PULSE). 180 tablet 2   nitroGLYCERIN (NITROSTAT) 0.4 MG SL tablet Place 1 tablet (0.4 mg total) under the tongue every 5 (five) minutes x 3 doses as needed for chest pain. 90 tablet 3   NON FORMULARY Take 1 tablet by mouth daily. OTC Nerve Vive     oxyCODONE-acetaminophen (PERCOCET/ROXICET) 5-325 MG tablet Take 1 tablet by mouth at bedtime, may repeat 1 additional tablet as needed. 60 tablet 0   phenytoin (DILANTIN) 100 MG ER capsule Take 2 capsules by mouth every morning 180 capsule 3   sacubitril-valsartan (ENTRESTO) 24-26 MG Take 1 tablet by mouth 2 (two) times daily. 180 tablet 1   Semaglutide,0.25 or 0.5MG/DOS, 2 MG/3ML SOPN Inject 0.5 mg into the skin once a week. 3 mL 2   No current facility-administered medications on file prior to visit.    PAST MEDICAL HISTORY: Past Medical History:  Diagnosis Date   ACS (acute  coronary syndrome) ([place])    Astrocytoma of frontal lobe ([place]) 05/31/2013   Coronary artery disease    DM (diabetes mellitus) ([place]) 08/09/2011   A1c at diagnosis = 12.4   Family history of breast cancer    GERD (gastroesophageal reflux disease) 08/09/2011   H/O heart artery stent 07/14/2018   Hyperlipidemia    LAD stenosis    Non-ST elevation (NSTEMI) myocardial infarction ([place]) 07/15/2019   Nonsustained ventricular tachycardia (H[facility])    NSVT (nonsustained ventricular tachycardia) ([place])     Seizure ([place]) 05/19/2013   Shortness of breath     PAST SURGICAL HISTORY: Past Surgical History:  Procedure Laterality Date   APPENDECTOMY     BRAIN BIOPSY N/A 05/24/2013   Procedure: Craniotomy for open biopsy;  Surgeon: Winfield Cunas, MD;  Location: [facility];  Service: Neurosurgery;  Laterality: N/A;  Craniotomy for open biopsy   CARDIAC CATHETERIZATION     CORONARY ANGIOPLASTY WITH STENT PLACEMENT     CORONARY/GRAFT ACUTE MI REVASCULARIZATION N/A 07/15/2019   Procedure: Coronary/Graft Acute MI Revascularization;  Surgeon: Adrian Prows, MD;  Location: [facility];  Service: Cardiovascular;  Laterality: N/A;   LEFT HEART CATH AND CORONARY ANGIOGRAPHY N/A 07/15/2019   Procedure: LEFT HEART CATH AND CORONARY ANGIOGRAPHY;  Surgeon: Adrian Prows, MD;  Location: [facility];  Service: Cardiovascular;  Laterality: N/A;    FAMILY HISTORY: The patient family history includes Bone cancer in his mother; Breast cancer in his mother; Diabetes in his brother, father, and paternal uncle; Heart disease in his father.   SOCIAL HISTORY:  The patient  reports that he has been smoking cigarettes. He has a 33.00 pack-year smoking history. He has never used smokeless tobacco. He reports that he does not currently use alcohol. He reports current drug use. Drug: Marijuana.  Review of Systems  Constitutional: Negative for chills and fever.  HENT:  Negative for ear discharge, ear pain and nosebleeds.   Eyes:  Negative for blurred vision and discharge.  Cardiovascular:  Negative for chest pain, claudication, dyspnea on exertion, leg swelling, near-syncope, orthopnea, palpitations, paroxysmal nocturnal dyspnea and syncope.  Respiratory:  Negative for cough and shortness of breath.   Endocrine: Negative for polydipsia, polyphagia and polyuria.  Hematologic/Lymphatic: Negative for bleeding problem.  Skin:  Negative for flushing and nail changes.  Musculoskeletal:  Negative for muscle cramps, muscle  weakness and myalgias.  Gastrointestinal:  Negative for abdominal pain, dysphagia, hematemesis, hematochezia, melena, nausea and vomiting.  Neurological:  Negative for dizziness, focal weakness and light-headedness.    PHYSICAL EXAM:    04/09/2022    2:33 PM 01/20/2022    9:47 AM 01/08/2022    8:13 AM  Vitals with BMI  Height 5' 11" 5' 11" 5' 11"  Weight 173 lbs 13 oz 175 lbs 14 oz 173 lbs 3 oz  BMI 24.25 44.03 47.42  Systolic 595 638 756  Diastolic 76 77 79  Pulse 85 83 93   Physical Exam  Constitutional: No distress.  Age appropriate, hemodynamically stable.   Neck: No JVD present.  Cardiovascular: Normal rate, regular rhythm, S1 normal, S2 normal, intact distal pulses and normal pulses. Exam reveals no gallop, no S3 and no S4.  Murmur heard. Holosystolic murmur is present with a grade of 3/6 at the apex. Pulmonary/Chest: No stridor. He has no wheezes. He has no rales.  Decreased breath sounds bilaterally, poor inspiratory effort  Abdominal: Soft. Bowel sounds are normal. He exhibits no distension. There is no abdominal  tenderness.  Musculoskeletal:        General: No edema.     Cervical back: Neck supple.  Neurological: He is alert and oriented to person, place, and time. He has intact cranial nerves (2-12).  Skin: Skin is warm and moist.   Cardiac Studies: EKG  04/09/2022: Normal sinus rhythm, 86 bpm, consider old anteroseptal infarct, without underlying ischemia or injury pattern, low voltage in limb leads.   Echocardiogram: 07/15/2019: LVEF 30-35%, moderate to severely decreased function, with regional wall motion abnormalities, grade 1 diastolic impairment, no mural thrombus, no pulmonary hypertension.  08/26/2020:  Left ventricle cavity is normal in size. Mild concentric hypertrophy of the left ventricle. Mid to distal anteroseptal and apical severe hypokinesis. LVEF 40-45%. Grade 1 diastolic dysfunction, normal LAP.  Mild (Grade I) mitral regurgitation.  No evidence of  pulmonary hypertension.  No significant change compared to previous study on 09/12/2019.    Left Heart Catheterization 07/15/19:  LV: Mid to distal anterior, anterolateral and apical akinesis, EF 35 to at most 40%.  Hand contrast injection, not well visualized.  Normal LVEDP. RCA: Dominant, mild diffuse disease. Left main: Mildly calcified. LAD: Diffusely diseased proximal segment, mid segment had a 90% stenosis.  Mid to distal segment has minimal disease and apical LAD had 99% stenosis.  Successful PTCA and stenting of the proximal/ostial and mid LAD with implantation of a long 3.0 x 38 mm resolute Onyx DES postdilated with 3.5 x 12 mm noncompliant sapphire balloon.  90% stenosis reduced to 0% with TIMI-3 to TIMI-3 flow.  Apical LAD stenosis left alone. Ramus intermediate: Large vessel, ostium has ulcerated appearing 99% stenosis.  Upon angioplasty realized it was also calcified and probably chronic.  Successful PTCA and stenting with 2 overlapping 2.5 x 15 and distally 2.5 x 13 mm resolute Onyx DES.  9 9% stenosis reduced to 0% with TIMI-3 to TIMI-3 flow. Circumflex: Moderate to large vessel, proximal to mid segment is diffusely diseased 30%.  Large OM1 with proximal long 60% to at most 70% stenosis.  48 hour Holter monitor: Dominant rhythm normal sinus, followed by tachycardia (27% burden). Heart rate 84-125 bpm.  Average heart rate 97 bpm. No atrial fibrillation and no sinus pause greater than or equal to 2.5 seconds in duration. No episodes of nonsustained ventricular tachycardia. No patient diary submitted as a part of the study.  LABORATORY DATA:    Latest Ref Rng & Units 07/16/2019    2:28 AM 07/15/2019    3:10 AM 07/14/2019    9:54 PM  CBC  WBC 4.0 - 10.5 K/uL 21.8  20.2  17.0   Hemoglobin 13.0 - 17.0 g/dL 14.5  17.1  18.1   Hematocrit 39.0 - 52.0 % 42.4  49.7  54.0   Platelets 150 - 400 K/uL 299  330  345        Latest Ref Rng & Units 01/20/2022   10:25 AM 03/30/2021   10:16 AM  07/24/2019   10:34 AM  CMP  Glucose 70 - 99 mg/dL 158  240  189   BUN 6 - 24 mg/dL _0 Creatinine 0.76 - 1.27 mg/dL 0.73  0.82  0.71   Sodium 134 - 144 mmol/L 139  136  138   Potassium 3.5 - 5.2 mmol/L 3.9  4.3  4.4   Chloride 96 - 106 mmol/L 104  100  100   CO2 20 - 29 mmol/L _1 Calcium 8.7 -  10.2 mg/dL 9.5  9.3  9.4     Lipid Panel     Component Value Date/Time   CHOL 111 09/12/2019 0804   TRIG 105 09/12/2019 0804   HDL 36 (L) 09/12/2019 0804   CHOLHDL 5.7 07/14/2019 2336   VLDL 25 07/14/2019 2336   LDLCALC 55 09/12/2019 0804   LABVLDL 20 09/12/2019 0804    Lab Results  Component Value Date   HGBA1C 7.4 (A) 01/20/2022   HGBA1C 8.2 (A) 09/23/2021   HGBA1C 8.2 (A) 09/24/2020   No components found for: "NTPROBNP" Lab Results  Component Value Date   TSH 1.073 07/18/2011    FINAL MEDICATION LIST END OF ENCOUNTER: No orders of the defined types were placed in this encounter.    There are no discontinued medications.    Current Outpatient Medications:    aspirin 81 MG chewable tablet, Chew 1 tablet (81 mg total) by mouth daily., Disp: 90 tablet, Rfl: 3   atorvastatin (LIPITOR) 80 MG tablet, TAKE 1 TABLET (80 MG TOTAL) BY MOUTH DAILY AT 6 PM., Disp: 90 tablet, Rfl: 2   blood glucose meter kit and supplies KIT, Dispense based on patient and insurance preference. Use up to four times daily as directed. (FOR ICD-9 250.00, 250.01)., Disp: 1 each, Rfl: 0   Blood Pressure Monitoring (BLOOD PRESSURE CUFF) MISC, 1 each by Does not apply route daily as needed., Disp: 1 each, Rfl: 0   clopidogrel (PLAVIX) 75 MG tablet, TAKE 1 TABLET BY MOUTH ONCE A DAY (NEEDS APPT), Disp: 90 tablet, Rfl: 3   gabapentin (NEURONTIN) 300 MG capsule, Take 1 capsule (300 mg total) by mouth at bedtime., Disp: 90 capsule, Rfl: 3   Insulin Pen Needle (PEN NEEDLES) 32G X 4 MM MISC, 1 pen by Does not apply route as directed., Disp: 100 each, Rfl: 11   metoprolol tartrate (LOPRESSOR) 50 MG  tablet, Take 1 tablet (50 mg total) by mouth 2 (two) times daily.HOLD IF SYSTOLIC BP (TOP BLOOD PRESSURE NUMBER) LESS THAN 100 MMHG OR HEART RATE LESS THAN 60 BPM (PULSE)., Disp: 180 tablet, Rfl: 2   nitroGLYCERIN (NITROSTAT) 0.4 MG SL tablet, Place 1 tablet (0.4 mg total) under the tongue every 5 (five) minutes x 3 doses as needed for chest pain., Disp: 90 tablet, Rfl: 3   NON FORMULARY, Take 1 tablet by mouth daily. OTC Nerve Vive, Disp: , Rfl:    oxyCODONE-acetaminophen (PERCOCET/ROXICET) 5-325 MG tablet, Take 1 tablet by mouth at bedtime, may repeat 1 additional tablet as needed., Disp: 60 tablet, Rfl: 0   phenytoin (DILANTIN) 100 MG ER capsule, Take 2 capsules by mouth every morning, Disp: 180 capsule, Rfl: 3   sacubitril-valsartan (ENTRESTO) 24-26 MG, Take 1 tablet by mouth 2 (two) times daily., Disp: 180 tablet, Rfl: 1   Semaglutide,0.25 or 0.5MG/DOS, 2 MG/3ML SOPN, Inject 0.5 mg into the skin once a week., Disp: 3 mL, Rfl: 2  IMPRESSION:    ICD-10-CM   1. Coronary artery disease involving native coronary artery of native heart without angina pectoris  I25.10 PCV ECHOCARDIOGRAM COMPLETE    Lipid Panel With LDL/HDL Ratio    LDL cholesterol, direct    CMP14+EGFR    2. Hx of non-ST elevation myocardial infarction (NSTEMI)  I25.2 EKG 12-Lead    PCV ECHOCARDIOGRAM COMPLETE    Lipid Panel With LDL/HDL Ratio    LDL cholesterol, direct    CMP14+EGFR    3. Ischemic cardiomyopathy  I25.5 PCV ECHOCARDIOGRAM COMPLETE    Lipid Panel With  LDL/HDL Ratio    LDL cholesterol, direct    CMP14+EGFR    4. S/P angioplasty with stent  Z95.820 PCV ECHOCARDIOGRAM COMPLETE    Lipid Panel With LDL/HDL Ratio    LDL cholesterol, direct    CMP14+EGFR    5. Nonsustained ventricular tachycardia (HCC)  I47.29 PCV ECHOCARDIOGRAM COMPLETE    6. Mixed hyperlipidemia  E78.2 Lipid Panel With LDL/HDL Ratio    LDL cholesterol, direct    CMP14+EGFR    7. Tobacco use  Z72.0     8. Type 2 diabetes mellitus with  other circulatory complication, with long-term current use of insulin (HCC)  E11.59 Lipid Panel With LDL/HDL Ratio   Z79.4 LDL cholesterol, direct    CMP14+EGFR    9. Long-term insulin use (HCC)  Z79.4     10. Astrocytoma of frontal lobe (HCC)  C71.1        RECOMMENDATIONS: DIXIE JAFRI is a 56 y.o. male whose past medical history and cardiac risk factors include: Hx of non-STEMI, established coronary artery disease with PCI, ischemic cardiomyopathy, hypertension, hyperlipidemia, insulin-dependent diabetes mellitus type 2, history of nonsustained ventricular tachycardia, active tobacco use, history of astrocytoma.  Established coronary artery disease status post stenting: Currently on dual antiplatelet therapy.   Denies anginal discomfort. EKG: Nonischemic. No use sublingual nitroglycerin tablets since last office visit. Last echo from 2022 independently reviewed at today's office visit. Medications reconciled. Reemphasized the importance of complete smoking cessation. Continue moderate intensity exercise as tolerated for 30 minutes a day 5 days a week. Will repeat an echocardiogram as follow-up to reevaluate LVEF prior to the next year visit.  Ischemic cardiomyopathy: Chronic and stable.  Medications reconciled.  See above.  History of nonsustained ventricular tachycardia: Continue beta-blocker therapy. No recurrence on repeat Holter and beta blocker tx.   Active tobacco use: Tobacco cessation counseling: Currently smoking 1 packs/day   Patient was informed of the dangers of tobacco abuse including stroke, cancer, and MI, as well as benefits of tobacco cessation. Patient is willing to quit at this time. 7 mins were spent counseling patient cessation techniques. We discussed various methods to help quit smoking, including deciding on a date to quit, joining a support group, pharmacological agents- nicotine gum/patch/lozenges.  I have advised the patient to discuss with his  neurologist which pharmacological agent would be least likely to predispose him to seizures as he is requiring pharmacological agents to help with smoking cessation. Patient and daughter verbalized understanding I will reassess his progress at the next follow-up visit  Insulin-dependent diabetes mellitus type 2:  Currently managed by primary care provider. Currently on ARNI, statin therapy, and Ozempic. A1c trending down. May consider Jardiance if blood pressure allows in the future. Educated on importance of glycemic control given his underlying CAD.    Mixed hyperlipidemia: Currently on atorvastatin.   He denies myalgia or other side effects. Will check fasting lipid profile.  On physical examination patient has decreased breath sounds bilaterally, poor inspiratory effort, likely secondary to undiagnosed emphysema/COPD.  He also needs age-appropriate lung cancer screening given his history of cigarette smoking.  I have asked the patient to discuss this with PCP and if needed may establish care with pulmonary medicine as well.  --Continue cardiac medications as reconciled in final medication list. --Return in about 1 year (around 04/10/2023) for Annual follow up visit, CAD. Or sooner if needed. --Continue follow-up with your primary care physician regarding the management of your other chronic comorbid conditions.  Patient's questions and concerns  were addressed to his satisfaction. He voices understanding of the instructions provided during this encounter.   This note was created using a voice recognition software as a result there may be grammatical errors inadvertently enclosed that do not reflect the nature of this encounter. Every attempt is made to correct such errors.  Rex Kras, [place], [facility]  Pager: [phone] Office: [phone]

## 2022-04-09 NOTE — Telephone Encounter (Signed)
RN called patient about medication refill and was thankful for the response from staff.  Nothing else follows.

## 2022-04-15 ENCOUNTER — Other Ambulatory Visit (HOSPITAL_COMMUNITY): Payer: Self-pay

## 2022-04-22 NOTE — Telephone Encounter (Signed)
Patient called about a payment. Called transferred to [id number] Billing Dept - (DS)

## 2022-05-05 ENCOUNTER — Encounter: Payer: Medicare Other | Admitting: Internal Medicine

## 2022-05-10 ENCOUNTER — Telehealth: Payer: Self-pay | Admitting: *Deleted

## 2022-05-10 NOTE — Telephone Encounter (Signed)
RETURNED PATIENT'S PHONE CALL, SPOKE WITH PATIENT. ?

## 2022-05-18 ENCOUNTER — Other Ambulatory Visit (HOSPITAL_COMMUNITY): Payer: Self-pay

## 2022-05-18 ENCOUNTER — Other Ambulatory Visit: Payer: Self-pay | Admitting: Internal Medicine

## 2022-05-18 ENCOUNTER — Other Ambulatory Visit: Payer: Self-pay | Admitting: Urology

## 2022-05-18 DIAGNOSIS — Z794 Long term (current) use of insulin: Secondary | ICD-10-CM

## 2022-05-18 MED ORDER — OXYCODONE-ACETAMINOPHEN 5-325 MG PO TABS
1.0000 | ORAL_TABLET | ORAL | 0 refills | Status: DC
  Filled 2022-05-18: qty 60, 30d supply, fill #0

## 2022-05-19 ENCOUNTER — Encounter: Payer: Medicare Other | Admitting: Internal Medicine

## 2022-05-19 ENCOUNTER — Other Ambulatory Visit (HOSPITAL_COMMUNITY): Payer: Self-pay

## 2022-05-21 ENCOUNTER — Other Ambulatory Visit: Payer: Self-pay | Admitting: Internal Medicine

## 2022-05-21 ENCOUNTER — Ambulatory Visit (HOSPITAL_COMMUNITY): Payer: Medicare Other

## 2022-05-21 ENCOUNTER — Other Ambulatory Visit (HOSPITAL_COMMUNITY): Payer: Self-pay

## 2022-05-21 ENCOUNTER — Ambulatory Visit
Admission: RE | Admit: 2022-05-21 | Discharge: 2022-05-21 | Disposition: A | Discharge status: Home / Self Care | Payer: Medicare HMO | Source: Ambulatory Visit | Attending: Internal Medicine | Admitting: Internal Medicine

## 2022-05-21 DIAGNOSIS — E1159 Type 2 diabetes mellitus with other circulatory complications: Secondary | ICD-10-CM

## 2022-05-21 DIAGNOSIS — C711 Malignant neoplasm of frontal lobe: Secondary | ICD-10-CM

## 2022-05-21 DIAGNOSIS — C729 Malignant neoplasm of central nervous system, unspecified: Secondary | ICD-10-CM | POA: Diagnosis not present

## 2022-05-21 MED ORDER — GADOPICLENOL 0.5 MMOL/ML IV SOLN
8.0000 mL | Freq: Once | INTRAVENOUS | Status: AC | PRN
  Administered 2022-05-21: 8 mL via INTRAVENOUS

## 2022-05-21 NOTE — Telephone Encounter (Signed)
LOV was 01/20/22.

## 2022-05-24 ENCOUNTER — Other Ambulatory Visit (HOSPITAL_COMMUNITY): Payer: Self-pay

## 2022-05-24 ENCOUNTER — Inpatient Hospital Stay: Payer: Medicare HMO | Attending: Internal Medicine

## 2022-05-24 DIAGNOSIS — Z923 Personal history of irradiation: Secondary | ICD-10-CM | POA: Insufficient documentation

## 2022-05-24 DIAGNOSIS — Z8249 Family history of ischemic heart disease and other diseases of the circulatory system: Secondary | ICD-10-CM | POA: Insufficient documentation

## 2022-05-24 DIAGNOSIS — Z833 Family history of diabetes mellitus: Secondary | ICD-10-CM | POA: Insufficient documentation

## 2022-05-24 DIAGNOSIS — Z7902 Long term (current) use of antithrombotics/antiplatelets: Secondary | ICD-10-CM | POA: Insufficient documentation

## 2022-05-24 DIAGNOSIS — Z803 Family history of malignant neoplasm of breast: Secondary | ICD-10-CM | POA: Insufficient documentation

## 2022-05-24 DIAGNOSIS — I252 Old myocardial infarction: Secondary | ICD-10-CM | POA: Insufficient documentation

## 2022-05-24 DIAGNOSIS — F17210 Nicotine dependence, cigarettes, uncomplicated: Secondary | ICD-10-CM | POA: Insufficient documentation

## 2022-05-24 DIAGNOSIS — E785 Hyperlipidemia, unspecified: Secondary | ICD-10-CM | POA: Insufficient documentation

## 2022-05-24 DIAGNOSIS — C711 Malignant neoplasm of frontal lobe: Secondary | ICD-10-CM | POA: Insufficient documentation

## 2022-05-24 DIAGNOSIS — I219 Acute myocardial infarction, unspecified: Secondary | ICD-10-CM | POA: Insufficient documentation

## 2022-05-24 DIAGNOSIS — Z5986 Financial insecurity: Secondary | ICD-10-CM | POA: Insufficient documentation

## 2022-05-24 DIAGNOSIS — E119 Type 2 diabetes mellitus without complications: Secondary | ICD-10-CM | POA: Insufficient documentation

## 2022-05-24 DIAGNOSIS — Z9049 Acquired absence of other specified parts of digestive tract: Secondary | ICD-10-CM | POA: Insufficient documentation

## 2022-05-24 DIAGNOSIS — I2510 Atherosclerotic heart disease of native coronary artery without angina pectoris: Secondary | ICD-10-CM | POA: Insufficient documentation

## 2022-05-24 DIAGNOSIS — Z79899 Other long term (current) drug therapy: Secondary | ICD-10-CM | POA: Insufficient documentation

## 2022-05-24 DIAGNOSIS — Z808 Family history of malignant neoplasm of other organs or systems: Secondary | ICD-10-CM | POA: Insufficient documentation

## 2022-05-24 DIAGNOSIS — F1290 Cannabis use, unspecified, uncomplicated: Secondary | ICD-10-CM | POA: Insufficient documentation

## 2022-05-24 MED ORDER — OZEMPIC (0.25 OR 0.5 MG/DOSE) 2 MG/3ML ~~LOC~~ SOPN
0.5000 mg | PEN_INJECTOR | SUBCUTANEOUS | 0 refills | Status: DC
  Filled 2022-05-24: qty 3, 28d supply, fill #0

## 2022-05-24 NOTE — Telephone Encounter (Signed)
Patient no showed his last appointment with me. I have approved a short term refill, please have him reschedule. Thanks.

## 2022-05-26 NOTE — Telephone Encounter (Signed)
Per chart, Next appt scheduled 06/23/22 with his PCP.

## 2022-05-27 ENCOUNTER — Other Ambulatory Visit: Payer: Self-pay

## 2022-05-27 ENCOUNTER — Inpatient Hospital Stay: Payer: Medicare HMO | Admitting: Internal Medicine

## 2022-05-27 VITALS — BP 119/90 | HR 96 | Temp 97.9°F | Resp 20 | Wt 177.3 lb

## 2022-05-27 DIAGNOSIS — C711 Malignant neoplasm of frontal lobe: Secondary | ICD-10-CM

## 2022-05-27 DIAGNOSIS — E119 Type 2 diabetes mellitus without complications: Secondary | ICD-10-CM | POA: Diagnosis not present

## 2022-05-27 DIAGNOSIS — Z79899 Other long term (current) drug therapy: Secondary | ICD-10-CM | POA: Diagnosis not present

## 2022-05-27 DIAGNOSIS — Z5986 Financial insecurity: Secondary | ICD-10-CM | POA: Diagnosis not present

## 2022-05-27 DIAGNOSIS — Z833 Family history of diabetes mellitus: Secondary | ICD-10-CM | POA: Diagnosis not present

## 2022-05-27 DIAGNOSIS — E785 Hyperlipidemia, unspecified: Secondary | ICD-10-CM | POA: Diagnosis not present

## 2022-05-27 DIAGNOSIS — F17210 Nicotine dependence, cigarettes, uncomplicated: Secondary | ICD-10-CM | POA: Diagnosis not present

## 2022-05-27 DIAGNOSIS — Z808 Family history of malignant neoplasm of other organs or systems: Secondary | ICD-10-CM | POA: Diagnosis not present

## 2022-05-27 DIAGNOSIS — G40109 Localization-related (focal) (partial) symptomatic epilepsy and epileptic syndromes with simple partial seizures, not intractable, without status epilepticus: Secondary | ICD-10-CM

## 2022-05-27 DIAGNOSIS — I2510 Atherosclerotic heart disease of native coronary artery without angina pectoris: Secondary | ICD-10-CM | POA: Diagnosis not present

## 2022-05-27 DIAGNOSIS — Z923 Personal history of irradiation: Secondary | ICD-10-CM | POA: Diagnosis not present

## 2022-05-27 DIAGNOSIS — Z803 Family history of malignant neoplasm of breast: Secondary | ICD-10-CM | POA: Diagnosis not present

## 2022-05-27 DIAGNOSIS — Z9049 Acquired absence of other specified parts of digestive tract: Secondary | ICD-10-CM | POA: Diagnosis not present

## 2022-05-27 DIAGNOSIS — F1290 Cannabis use, unspecified, uncomplicated: Secondary | ICD-10-CM | POA: Diagnosis not present

## 2022-05-27 DIAGNOSIS — Z8249 Family history of ischemic heart disease and other diseases of the circulatory system: Secondary | ICD-10-CM | POA: Diagnosis not present

## 2022-05-27 DIAGNOSIS — I252 Old myocardial infarction: Secondary | ICD-10-CM | POA: Diagnosis not present

## 2022-05-27 DIAGNOSIS — Z7902 Long term (current) use of antithrombotics/antiplatelets: Secondary | ICD-10-CM | POA: Diagnosis not present

## 2022-05-27 DIAGNOSIS — I219 Acute myocardial infarction, unspecified: Secondary | ICD-10-CM | POA: Diagnosis not present

## 2022-05-27 NOTE — Progress Notes (Signed)
[facility] [place],  [id number] [phone]   Interval Evaluation  Date of Service: 05/27/22 Patient Name: Jonathan Barker Patient MRN: [id number] Patient DOB: 1966/01/10 Provider: Ventura Sellers, MD  Identifying Statement:  Jonathan Barker is a 57 y.o. 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 Christella Noa, pathology demonstrates WHO grade II diffuse astrocytoma   06/18/2013 - 09/03/2013 Radiation Therapy   54 Gy IMRT in 30 fx, concurrent Temodar, with Dr. 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  The CNS/Brain Cancer Panel offered by [facility] includes sequencing and/or deletion duplication testing of the following 42 genes: ALK, APC, BAP1, BARD1, CDK4, CDKN2A, DICER1, EPCAM, EZH2, GPC3, HRAS, KIF1B, MEN1, MLH1, MSH2, MSH6, NF1, NF2, PHOX2B, PMS2, PO1, PRKAR1A, PTCH2, PTEN, RB1, SMARCA4, SMARCB1, SMARCE1, SUFU, TP53, TSC1, TSC2, and VHL.  The report date is June 30, 2015. 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  The updated report date is July 11, 2020.     Biomarkers:  MGMT Unknown.  IDH 1/2 Unknown.  EGFR Unknown  TERT Unknown   Interval History:  Jonathan Barker presents today for follow up after MRI brain.  Denies any clinical changes.  No seizures or headaches.  Continues to follow with cardiology for post-MI care. Golfing frequently, remains active.  Still smoking.  H+P (03/31/17) Patient presented initially in early 2015 with a focal seizure, described as "left side clenching up", "change in awareness without loss in memory".  This event  led to an MRI brain which demonstrated a likely mass centered in the right frontal lobe.  This underwent biopsy on 05/24/13, pathology revealed grade II glioma.  No genetic markers were sent at that time.  Following surgery he underwent radiation therapy as above, with concurrent Temodar.  Although he did consult with a neuro-oncologist in [place], no further therapy was administered, and he has been on observation for the past 3 years.  He return today with an MRI for evaluation.  He denies any recent seizures, does complain of 3-4 headaches per week as prior.    Medications: Current Outpatient Medications on File Prior to Visit  Medication Sig Dispense Refill   aspirin 81 MG chewable tablet Chew 1 tablet (81 mg total) by mouth daily. 90 tablet 3   atorvastatin (LIPITOR) 80 MG tablet TAKE 1 TABLET (80 MG TOTAL) BY MOUTH DAILY AT 6 PM. 90 tablet 2   blood glucose meter kit and supplies KIT Dispense based on patient and insurance preference. Use up to four times daily as directed. (FOR ICD-9 250.00, 250.01). 1 each 0   Blood Pressure Monitoring (BLOOD PRESSURE CUFF) MISC 1 each by Does not apply route daily as needed. 1 each 0   clopidogrel (PLAVIX) 75 MG tablet TAKE 1 TABLET BY MOUTH ONCE A DAY (NEEDS APPT) 90 tablet 3   gabapentin (NEURONTIN) 300 MG capsule Take 1 capsule (300 mg total) by mouth at bedtime. 90 capsule 3   Insulin Pen Needle (PEN NEEDLES) 32G X 4 MM MISC 1 pen by Does not apply route as directed. 100 each 11   metoprolol tartrate (  LOPRESSOR) 50 MG tablet Take 1 tablet (50 mg total) by mouth 2 (two) times daily.HOLD IF SYSTOLIC BP (TOP BLOOD PRESSURE NUMBER) LESS THAN 100 MMHG OR HEART RATE LESS THAN 60 BPM (PULSE). 180 tablet 2   nitroGLYCERIN (NITROSTAT) 0.4 MG SL tablet Place 1 tablet (0.4 mg total) under the tongue every 5 (five) minutes x 3 doses as needed for chest pain. 90 tablet 3   NON FORMULARY Take 1 tablet by mouth daily. OTC Nerve Vive     oxyCODONE-acetaminophen  (PERCOCET/ROXICET) 5-325 MG tablet Take 1 tablet by mouth at bedtime, may repeat 1 additional tablet as needed. 60 tablet 0   phenytoin (DILANTIN) 100 MG ER capsule Take 2 capsules by mouth every morning 180 capsule 3   sacubitril-valsartan (ENTRESTO) 24-26 MG Take 1 tablet by mouth 2 (two) times daily. 180 tablet 1   Semaglutide,0.25 or 0.'5MG'$ /DOS, (OZEMPIC, 0.25 OR 0.5 MG/DOSE,) 2 MG/3ML SOPN Inject 0.5 mg into the skin once a week. 3 mL 0   No current facility-administered medications on file prior to visit.    Allergies: No Known Allergies Past Medical History:  Past Medical History:  Diagnosis Date   ACS (acute coronary syndrome) ([place])    Astrocytoma of frontal lobe ([place]) 05/31/2013   Claudication ([place]) 09/24/2020   Coronary artery disease    DM (diabetes mellitus) ([place]) 08/09/2011   A1c at diagnosis = 12.4   Family history of breast cancer    GERD (gastroesophageal reflux disease) 08/09/2011   H/O heart artery stent 07/14/2018   Hyperlipidemia    LAD stenosis    Non-ST elevation (NSTEMI) myocardial infarction ([place]) 07/15/2019   Nonsustained ventricular tachycardia ([facility])    NSVT (nonsustained ventricular tachycardia) ([place])    Screening for colon cancer 09/24/2020   Seizure ([place] ) 05/19/2013   Shortness of breath    Past Surgical History:  Past Surgical History:  Procedure Laterality Date   APPENDECTOMY     BRAIN BIOPSY N/A 05/24/2013   Procedure: Craniotomy for open biopsy;  Surgeon: Winfield Cunas, MD;  Location: [facility];  Service: Neurosurgery;  Laterality: N/A;  Craniotomy for open biopsy   CARDIAC CATHETERIZATION     CORONARY ANGIOPLASTY WITH STENT PLACEMENT     CORONARY/GRAFT ACUTE MI REVASCULARIZATION N/A 07/15/2019   Procedure: Coronary/Graft Acute MI Revascularization;  Surgeon: Adrian Prows, MD;  Location: [facility];  Service: Cardiovascular;  Laterality: N/A;   LEFT HEART CATH AND CORONARY ANGIOGRAPHY N/A 07/15/2019   Procedure: LEFT HEART CATH AND  CORONARY ANGIOGRAPHY;  Surgeon: Adrian Prows, MD;  Location: [facility];  Service: Cardiovascular;  Laterality: N/A;   Social History:  Social History   Socioeconomic History   Marital status: Single    Spouse name: Not on file   Number of children: 2   Years of education: 11   Highest education level: Not on file  Occupational History    Employer: [employer]  Tobacco Use   Smoking status: Every Day    Packs/day: 1.00    Years: 33.00    Total pack years: 33.00    Types: Cigarettes   Smokeless tobacco: Never  Vaping Use   Vaping Use: Never used  Substance and Sexual Activity   Alcohol use: Not Currently   Drug use: Yes    Types: Marijuana    Comment: Marijuana daily   Sexual activity: Not on file  Other Topics Concern   Not on file  Social History Narrative   [place]  [facility] [address] [place]. He does not work now.    Right handed      Highest level of edu- 11th grade      Lives in one story home. Steps to enter home   Social Determinants of Health   Financial Resource Strain: High Risk (01/12/2022)   Overall Financial Resource Strain (CARDIA)    Difficulty of Paying Living Expenses: Hard  Food Insecurity: Food Insecurity Present (01/12/2022)   Hunger Vital Sign    Worried About Running Out of Food in the Last Year: Often true    Ran Out of Food in the Last Year: Often true  Transportation Needs: No Transportation Needs (01/12/2022)   PRAPARE - Hydrologist (Medical): No    Lack of Transportation (Non-Medical): No  Physical Activity: Sufficiently Active (01/12/2022)   Exercise Vital Sign    Days of Exercise per Week: 7 days    Minutes of Exercise per Session: 40 min  Stress: No Stress Concern Present (01/12/2022)   [place]    Feeling of Stress : Not at all  Social Connections: Socially Isolated (01/12/2022)   Social Connection and Isolation Panel  [NHANES]    Frequency of Communication with Friends and Family: More than three times a week    Frequency of Social Gatherings with Friends and Family: Once a week    Attends Religious Services: Never    Marine scientist or Organizations: No    Attends Archivist Meetings: Never    Marital Status: Never married  Intimate Partner Violence: Not At Risk (01/12/2022)   Humiliation, Afraid, Rape, and Kick questionnaire    Fear of Current or Ex-Partner: No    Emotionally Abused: No    Physically Abused: No    Sexually Abused: No   Family History:  Family History  Problem Relation Age of Onset   Breast cancer Mother        dx in her 62s-60s   Bone cancer Mother    Diabetes Father    Heart disease Father    Diabetes Brother    Diabetes Paternal Uncle     Review of Systems: Constitutional: Denies fevers, chills or abnormal weight loss Eyes: Denies blurriness of vision Ears, nose, mouth, throat, and face: Denies mucositis or sore throat Respiratory: Denies cough, dyspnea or wheezes Cardiovascular: Denies palpitation, chest discomfort or lower extremity swelling Gastrointestinal:  Denies nausea, constipation, diarrhea GU: Denies dysuria or incontinence Skin: Denies abnormal skin rashes Neurological: Per HPI Musculoskeletal: Denies joint pain, back or neck discomfort. No decrease in ROM Behavioral/Psych: Denies anxiety, disturbance in thought content, and mood instability  Physical Exam: Vitals:   05/27/22 1050  BP: (!) 119/90  Pulse: 96  Resp: 20  Temp: 97.9 F (36.6 C)  SpO2: 100%    KPS: 90. General: Alert, cooperative, pleasant, in no acute distress Head: Normal EENT: No conjunctival injection or scleral icterus. Oral mucosa moist Lungs: Resp effort normal Cardiac: Regular rate and rhythm Abdomen: Soft, non-distended abdomen Skin: No rashes cyanosis or petechiae. Extremities: No clubbing or edema  Neurologic Exam: Mental Status: Awake, alert,  attentive to examiner. Oriented to self and environment. Language is fluent with intact comprehension.  Cranial Nerves: Visual acuity is grossly normal. Visual fields are full. Extra-ocular movements intact. No ptosis. Face is symmetric, tongue midline. Motor: Tone and bulk are normal. Power is full in both arms and legs. Reflexes are symmetric, no pathologic reflexes  present. Intact finger to nose bilaterally Sensory: Intact to light touch and temperature Gait: Normal and tandem gait is normal.   Labs: I have reviewed the data as listed    Component Value Date/Time   NA 139 01/20/2022 1025   NA 139 01/16/2014 1035   K 3.9 01/20/2022 1025   K 4.1 01/16/2014 1035   CL 104 01/20/2022 1025   CO2 22 01/20/2022 1025   CO2 24 01/16/2014 1035   GLUCOSE 158 (H) 01/20/2022 1025   GLUCOSE 156 (H) 07/16/2019 0228   GLUCOSE 188 (H) 01/16/2014 1035   BUN 9 01/20/2022 1025   BUN 11.6 01/16/2014 1035   CREATININE 0.73 (L) 01/20/2022 1025   CREATININE 1.1 01/16/2014 1035   CALCIUM 9.5 01/20/2022 1025   CALCIUM 9.5 01/16/2014 1035   PROT 7.1 11/16/2013 0921   ALBUMIN 3.9 11/16/2013 0921   AST 23 11/16/2013 0921   ALT 35 11/16/2013 0921   ALKPHOS 83 11/16/2013 0921   BILITOT 0.27 11/16/2013 0921   GFRNONAA 108 07/24/2019 1034   GFRAA 125 07/24/2019 1034   Lab Results  Component Value Date   WBC 21.8 (H) 07/16/2019   NEUTROABS 5.7 11/16/2013   HGB 14.5 07/16/2019   HCT 42.4 07/16/2019   MCV 89.5 07/16/2019   PLT 299 07/16/2019    Imaging: [place] Clinician Interpretation: I have personally reviewed the CNS images as listed.  My interpretation, in the context of the patient's clinical presentation, is stable disease  MR BRAIN W WO CONTRAST  Result Date: 05/21/2022 CLINICAL DATA:  Brain/CNS neoplasm. Assess treatment response. Astrocytoma. EXAM: MRI HEAD WITHOUT AND WITH CONTRAST TECHNIQUE: Multiplanar, multiecho pulse sequences of the brain and surrounding structures were obtained without  and with intravenous contrast. CONTRAST:  8 cc Vueway COMPARISON:  05/25/2021.  08/01/2020. FINDINGS: Brain: Diffusion imaging does not show any restricted diffusion. No focal abnormality affects the brainstem or cerebellum. The left cerebral hemisphere shows a stable pattern of white matter T2 and FLAIR signal in the left frontal lobe and corpus callosum consistent with regional prior radiation. This is not progressive in any way. The remainder of the hemispheres normal except for age related volume loss. On the right, there has been previous frontal craniotomy with tumor resection. Postsurgical cystic spaces are precisely stable since the prior exam. Regional T2 and FLAIR signal within the white matter is stable. No evidence of mass effect or extension of T2 signal. Previously seen small foci of enhancement in the right frontal region are now completely resolved. On the immediate prior exam, there is a minimal focus of enhancement visible in the frontal white matter, which would be approximately image 107 on today's exam. No enhancement is present in that location now. No new or progressive finding. Vascular: Major vessels at the base of the brain show flow. Skull and upper cervical spine: Otherwise negative Sinuses/Orbits: Clear/normal Other: None IMPRESSION: 1. Stable appearance of the brain. Previous right frontal craniotomy with tumor resection. Regional T2 and FLAIR signal within the white matter is stable. Resolution of previously seen tiny enhancing focus. No new or progressive finding. 2. Stable pattern of white matter T2 and FLAIR signal in the left frontal lobe and corpus callosum consistent with regional radiation. Electronically Signed   By: Nelson Chimes M.D.   On: 05/21/2022 13:18     Assessment/Plan 1. Astrocytoma of frontal lobe ([place])   Jonathan Barker is clinically and radiographically stable today.  No new or progressive changes.  He continues to remain active,  seizure free on dilantin  now '200mg'$  daily.  We ask that Jonathan Barker return to clinic in 12 months following next brain MRI, or sooner as needed.  Will con't to follow with Dr. Delice Lesch for seizures.   All questions were answered. The patient knows to call the clinic with any problems, questions or concerns. No barriers to learning were detected.  I have spent a total of 30 minutes of face-to-face and non-face-to-face time, excluding clinical staff time, preparing to see patient, ordering tests and/or medications, counseling the patient, and independently interpreting results and communicating results to the patient/family/caregiver    Ventura Sellers, MD Medical Director of Neuro-Oncology [facility] 05/27/22 10:58 AM

## 2022-05-28 ENCOUNTER — Telehealth: Payer: Self-pay | Admitting: Internal Medicine

## 2022-05-28 ENCOUNTER — Other Ambulatory Visit: Payer: Self-pay | Admitting: Radiation Therapy

## 2022-05-28 NOTE — Telephone Encounter (Signed)
Called patient per 1/25 los notes to schedule f/u. Patient scheduled and notified. Mailing reminder.

## 2022-06-15 ENCOUNTER — Other Ambulatory Visit: Payer: Self-pay | Admitting: Urology

## 2022-06-15 MED ORDER — OXYCODONE-ACETAMINOPHEN 5-325 MG PO TABS
1.0000 | ORAL_TABLET | Freq: Every evening | ORAL | 0 refills | Status: DC | PRN
  Filled 2022-06-29: qty 60, 30d supply, fill #0

## 2022-06-16 ENCOUNTER — Other Ambulatory Visit (HOSPITAL_COMMUNITY): Payer: Self-pay

## 2022-06-16 ENCOUNTER — Other Ambulatory Visit: Payer: Self-pay | Admitting: Internal Medicine

## 2022-06-16 ENCOUNTER — Other Ambulatory Visit: Payer: Self-pay | Admitting: Urology

## 2022-06-16 DIAGNOSIS — Z794 Long term (current) use of insulin: Secondary | ICD-10-CM

## 2022-06-16 NOTE — Telephone Encounter (Signed)
Next appt scheduled 2/21 with PCP. 

## 2022-06-17 ENCOUNTER — Other Ambulatory Visit (HOSPITAL_COMMUNITY): Payer: Self-pay

## 2022-06-17 MED ORDER — OZEMPIC (0.25 OR 0.5 MG/DOSE) 2 MG/3ML ~~LOC~~ SOPN
0.5000 mg | PEN_INJECTOR | SUBCUTANEOUS | 2 refills | Status: DC
  Filled 2022-06-17: qty 3, 28d supply, fill #0
  Filled 2022-07-26: qty 3, 28d supply, fill #1
  Filled 2022-08-23: qty 3, 28d supply, fill #2

## 2022-06-17 NOTE — Telephone Encounter (Signed)
Refill request is too soon. I only refill this Rx every 6 weeks and have been doing so since 2019. If he is needing the medication more frequently now, he will need to be evaluated with either Dr. Mickeal Skinner or Dr. Delice Lesch to discuss whether they are comfortable prescribing more frequently.

## 2022-06-18 ENCOUNTER — Other Ambulatory Visit (HOSPITAL_COMMUNITY): Payer: Self-pay

## 2022-06-22 ENCOUNTER — Other Ambulatory Visit (HOSPITAL_COMMUNITY): Payer: Self-pay

## 2022-06-23 ENCOUNTER — Encounter: Payer: Medicare HMO | Admitting: Internal Medicine

## 2022-06-28 ENCOUNTER — Other Ambulatory Visit: Payer: Self-pay | Admitting: Cardiology

## 2022-06-28 ENCOUNTER — Other Ambulatory Visit (HOSPITAL_COMMUNITY): Payer: Self-pay

## 2022-06-28 ENCOUNTER — Other Ambulatory Visit: Payer: Self-pay

## 2022-06-28 MED ORDER — ENTRESTO 24-26 MG PO TABS
1.0000 | ORAL_TABLET | Freq: Two times a day (BID) | ORAL | 1 refills | Status: DC
  Filled 2022-06-28: qty 180, 90d supply, fill #0
  Filled 2022-11-29 (×2): qty 180, 90d supply, fill #1

## 2022-06-29 ENCOUNTER — Other Ambulatory Visit (HOSPITAL_COMMUNITY): Payer: Self-pay

## 2022-07-26 ENCOUNTER — Other Ambulatory Visit (HOSPITAL_COMMUNITY): Payer: Self-pay

## 2022-07-28 ENCOUNTER — Other Ambulatory Visit: Payer: Self-pay | Admitting: Urology

## 2022-07-28 ENCOUNTER — Telehealth: Payer: Self-pay

## 2022-07-28 ENCOUNTER — Other Ambulatory Visit (HOSPITAL_COMMUNITY): Payer: Self-pay

## 2022-07-28 DIAGNOSIS — G8929 Other chronic pain: Secondary | ICD-10-CM

## 2022-07-28 MED ORDER — OXYCODONE-ACETAMINOPHEN 5-325 MG PO TABS
1.0000 | ORAL_TABLET | Freq: Every evening | ORAL | 0 refills | Status: DC | PRN
  Filled 2022-08-10: qty 60, 30d supply, fill #0
  Filled ????-??-??: fill #0

## 2022-07-28 NOTE — Telephone Encounter (Addendum)
Spoke w/ patient, verified his identity and relayed the information below.     Per [employer], "If he is having more frequent headaches and needing to take narcotics more frequently, he needs to follow up with Dr. Mickeal Skinner for further recommendation and likely will be referred to a pain clinic for management of his chronic pain. I will refill the Rx but it will not be available for pick up until 08/10/22 since it was last filled 06/29/22. He just saw Dr. Mickeal Skinner in 05/2022, with no concerning findings on his MRI brain scan and I do not see any documentation of more frequent headaches but if he confirms that the headaches are more frequent, please reach out to [facility] to help coordinate a follow up visit with Dr. Mickeal Skinner, first available."  Patient states "Headaches are not more frequent and only occur at night, pain 9/10. I need my Oxycodone immediately because I will run out today 07/28/22. I don't want to see Dr. Mickeal Skinner because he can't fill my Oxycodone. I will come in to see Ashlyn Bruning PA-C if that's what it takes to get it filled and ready for pick-up right away. I do not want to go to a pain clinic because I take my Oxycodone properly, but I will settle for every 6wk refills of my Oxycodone if that is my only option."  Ashlyn Bruning PA-C and patient care team have been notified.   Leandra Kern, LPN

## 2022-07-28 NOTE — Addendum Note (Signed)
Addended by: Nicholos Johns on: 07/28/2022 03:53 PM   Modules accepted: Orders

## 2022-07-30 ENCOUNTER — Other Ambulatory Visit (HOSPITAL_COMMUNITY): Payer: Self-pay

## 2022-08-04 ENCOUNTER — Encounter: Payer: Medicare HMO | Admitting: Internal Medicine

## 2022-08-07 ENCOUNTER — Other Ambulatory Visit (HOSPITAL_COMMUNITY): Payer: Self-pay

## 2022-08-09 ENCOUNTER — Other Ambulatory Visit (HOSPITAL_COMMUNITY): Payer: Self-pay

## 2022-08-10 ENCOUNTER — Other Ambulatory Visit (HOSPITAL_COMMUNITY): Payer: Self-pay

## 2022-08-23 ENCOUNTER — Other Ambulatory Visit (HOSPITAL_COMMUNITY): Payer: Self-pay

## 2022-08-24 ENCOUNTER — Other Ambulatory Visit (HOSPITAL_COMMUNITY): Payer: Self-pay

## 2022-09-20 ENCOUNTER — Other Ambulatory Visit: Payer: Self-pay | Admitting: Internal Medicine

## 2022-09-20 ENCOUNTER — Other Ambulatory Visit (HOSPITAL_COMMUNITY): Payer: Self-pay

## 2022-09-20 DIAGNOSIS — Z794 Long term (current) use of insulin: Secondary | ICD-10-CM

## 2022-09-21 ENCOUNTER — Other Ambulatory Visit (HOSPITAL_COMMUNITY): Payer: Self-pay

## 2022-09-21 MED ORDER — OZEMPIC (0.25 OR 0.5 MG/DOSE) 2 MG/3ML ~~LOC~~ SOPN
0.5000 mg | PEN_INJECTOR | SUBCUTANEOUS | 0 refills | Status: DC
  Filled 2022-09-21: qty 3, 28d supply, fill #0

## 2022-09-21 NOTE — Telephone Encounter (Signed)
Please have patient schedule follow up for his diabetes.

## 2022-09-24 ENCOUNTER — Other Ambulatory Visit: Payer: Self-pay | Admitting: Urology

## 2022-09-24 ENCOUNTER — Other Ambulatory Visit (HOSPITAL_COMMUNITY): Payer: Self-pay

## 2022-09-25 ENCOUNTER — Other Ambulatory Visit (HOSPITAL_COMMUNITY): Payer: Self-pay

## 2022-09-28 ENCOUNTER — Other Ambulatory Visit: Payer: Self-pay | Admitting: Urology

## 2022-09-28 ENCOUNTER — Other Ambulatory Visit (HOSPITAL_COMMUNITY): Payer: Self-pay

## 2022-09-28 MED ORDER — OXYCODONE-ACETAMINOPHEN 5-325 MG PO TABS
1.0000 | ORAL_TABLET | Freq: Every evening | ORAL | 0 refills | Status: DC | PRN
  Filled 2022-09-28: qty 60, 30d supply, fill #0

## 2022-10-16 ENCOUNTER — Other Ambulatory Visit: Payer: Self-pay | Admitting: Internal Medicine

## 2022-10-16 ENCOUNTER — Other Ambulatory Visit (HOSPITAL_COMMUNITY): Payer: Self-pay

## 2022-10-16 DIAGNOSIS — E1159 Type 2 diabetes mellitus with other circulatory complications: Secondary | ICD-10-CM

## 2022-10-18 ENCOUNTER — Other Ambulatory Visit (HOSPITAL_COMMUNITY): Payer: Self-pay

## 2022-10-18 MED ORDER — ATORVASTATIN CALCIUM 80 MG PO TABS
ORAL_TABLET | Freq: Every day | ORAL | 3 refills | Status: DC
  Filled 2022-10-18: qty 90, 90d supply, fill #0
  Filled 2023-02-02: qty 90, 90d supply, fill #1
  Filled 2023-05-31: qty 90, 90d supply, fill #2
  Filled 2023-09-19 (×2): qty 90, 90d supply, fill #3

## 2022-10-18 MED ORDER — OZEMPIC (0.25 OR 0.5 MG/DOSE) 2 MG/3ML ~~LOC~~ SOPN
0.5000 mg | PEN_INJECTOR | SUBCUTANEOUS | 0 refills | Status: DC
Start: 2022-10-18 — End: 2022-11-10
  Filled 2022-10-18: qty 3, 28d supply, fill #0

## 2022-10-20 ENCOUNTER — Other Ambulatory Visit (HOSPITAL_COMMUNITY): Payer: Self-pay

## 2022-10-25 ENCOUNTER — Other Ambulatory Visit (HOSPITAL_COMMUNITY): Payer: Self-pay

## 2022-11-10 ENCOUNTER — Encounter: Payer: Self-pay | Admitting: Internal Medicine

## 2022-11-10 ENCOUNTER — Other Ambulatory Visit: Payer: Self-pay

## 2022-11-10 ENCOUNTER — Ambulatory Visit (INDEPENDENT_AMBULATORY_CARE_PROVIDER_SITE_OTHER): Payer: Medicare HMO | Admitting: Internal Medicine

## 2022-11-10 ENCOUNTER — Other Ambulatory Visit (HOSPITAL_COMMUNITY): Payer: Self-pay

## 2022-11-10 VITALS — BP 115/76 | HR 85 | Temp 97.6°F | Ht 71.0 in | Wt 175.1 lb

## 2022-11-10 DIAGNOSIS — G6289 Other specified polyneuropathies: Secondary | ICD-10-CM

## 2022-11-10 DIAGNOSIS — Z7985 Long-term (current) use of injectable non-insulin antidiabetic drugs: Secondary | ICD-10-CM

## 2022-11-10 DIAGNOSIS — E785 Hyperlipidemia, unspecified: Secondary | ICD-10-CM

## 2022-11-10 DIAGNOSIS — Z794 Long term (current) use of insulin: Secondary | ICD-10-CM

## 2022-11-10 DIAGNOSIS — E782 Mixed hyperlipidemia: Secondary | ICD-10-CM | POA: Diagnosis not present

## 2022-11-10 DIAGNOSIS — E1142 Type 2 diabetes mellitus with diabetic polyneuropathy: Secondary | ICD-10-CM

## 2022-11-10 DIAGNOSIS — C711 Malignant neoplasm of frontal lobe: Secondary | ICD-10-CM | POA: Diagnosis not present

## 2022-11-10 DIAGNOSIS — Z720 Tobacco use: Secondary | ICD-10-CM

## 2022-11-10 DIAGNOSIS — Z1211 Encounter for screening for malignant neoplasm of colon: Secondary | ICD-10-CM

## 2022-11-10 DIAGNOSIS — E1159 Type 2 diabetes mellitus with other circulatory complications: Secondary | ICD-10-CM | POA: Diagnosis not present

## 2022-11-10 DIAGNOSIS — F17210 Nicotine dependence, cigarettes, uncomplicated: Secondary | ICD-10-CM | POA: Diagnosis not present

## 2022-11-10 DIAGNOSIS — I2510 Atherosclerotic heart disease of native coronary artery without angina pectoris: Secondary | ICD-10-CM

## 2022-11-10 LAB — POCT GLYCOSYLATED HEMOGLOBIN (HGB A1C): Hemoglobin A1C: 6.8 % — AB (ref 4.0–5.6)

## 2022-11-10 LAB — GLUCOSE, CAPILLARY: Glucose-Capillary: 138 mg/dL — ABNORMAL HIGH (ref 70–99)

## 2022-11-10 MED ORDER — GABAPENTIN 600 MG PO TABS
600.0000 mg | ORAL_TABLET | Freq: Two times a day (BID) | ORAL | 1 refills | Status: DC
Start: 2022-11-10 — End: 2023-06-08
  Filled 2022-11-10: qty 180, 90d supply, fill #0
  Filled 2023-02-07 – 2023-02-14 (×2): qty 180, 90d supply, fill #1

## 2022-11-10 MED ORDER — OZEMPIC (0.25 OR 0.5 MG/DOSE) 2 MG/3ML ~~LOC~~ SOPN
0.5000 mg | PEN_INJECTOR | SUBCUTANEOUS | 0 refills | Status: DC
Start: 2022-11-10 — End: 2022-12-14
  Filled 2022-11-10: qty 3, 28d supply, fill #0

## 2022-11-10 MED ORDER — VARENICLINE TARTRATE (STARTER) 0.5 MG X 11 & 1 MG X 42 PO TBPK
ORAL_TABLET | ORAL | 0 refills | Status: DC
Start: 2022-11-10 — End: 2023-05-31
  Filled 2022-11-10: qty 53, 30d supply, fill #0

## 2022-11-10 NOTE — Assessment & Plan Note (Addendum)
Continues to smoke, is interested in quitting.  Not a candidate for Wellbutrin given history of seizures currently on Dilantin.  He is interested in trying Chantix.  Sent starter pack to pharmacy.  Low-dose CT chest ordered for lung cancer screening.

## 2022-11-10 NOTE — Assessment & Plan Note (Signed)
Patient has bilateral peripheral neuropathy starting in his toes and extending to his shins. He has decree sensation to light touch with very diminished patellar reflexes bilaterally.  Suspect this is due to to his diabetes which was uncontrolled for many years.  He is doing much better with glycemic control since starting semaglutide and is now off of insulin therapy.  He takes gabapentin 300 mg at bedtime without significant relief.  Will increase this today to 600 mg BID PRN.

## 2022-11-10 NOTE — Progress Notes (Signed)
Subjective:   Patient ID: Jonathan Barker male   DOB: 10/17/1965 57 y.o.   MRN: [id number]  HPI: Jonathan Barker is a 57 y.o. male with past medical history outlined below here for diabetes follow up. For further details of today's visit, please refer to the assessment and plan below.   Past Medical History:  Diagnosis Date   ACS (acute coronary syndrome) (HCC)    Astrocytoma of frontal lobe (HCC) 05/31/2013   Claudication (HCC) 09/24/2020   Coronary artery disease    DM (diabetes mellitus) (HCC) 08/09/2011   A1c at diagnosis = 12.4   Family history of breast cancer    GERD (gastroesophageal reflux disease) 08/09/2011   H/O heart artery stent 07/14/2018   Hyperlipidemia    LAD stenosis    Non-ST elevation (NSTEMI) myocardial infarction (HCC) 07/15/2019   Nonsustained ventricular tachycardia (HCC)    NSVT (nonsustained ventricular tachycardia) (HCC)    Screening for colon cancer 09/24/2020   Seizure (HCC) 05/19/2013   Shortness of breath    Current Outpatient Medications  Medication Sig Dispense Refill   gabapentin (NEURONTIN) 600 MG tablet Take 1 tablet (600 mg total) by mouth 2 (two) times daily. 180 tablet 1   Varenicline Tartrate, Starter, (CHANTIX STARTING MONTH PAK) 0.5 MG X 11 & 1 MG X 42 TBPK Please take as instructed 53 each 0   aspirin 81 MG chewable tablet Chew 1 tablet (81 mg total) by mouth daily. 90 tablet 3   atorvastatin (LIPITOR) 80 MG tablet TAKE 1 TABLET BY MOUTH DAILY AT 6 PM. 90 tablet 3   blood glucose meter kit and supplies KIT Dispense based on patient and insurance preference. Use up to four times daily as directed. (FOR ICD-9 250.00, 250.01). 1 each 0   Blood Pressure Monitoring (BLOOD PRESSURE CUFF) MISC 1 each by Does not apply route daily as needed. 1 each 0   Insulin Pen Needle (PEN NEEDLES) 32G X 4 MM MISC 1 pen by Does not apply route as directed. 100 each 11   metoprolol tartrate (LOPRESSOR) 50 MG tablet Take 1 tablet (50 mg total) by  mouth 2 (two) times daily.HOLD IF SYSTOLIC BP (TOP BLOOD PRESSURE NUMBER) LESS THAN 100 MMHG OR HEART RATE LESS THAN 60 BPM (PULSE). 180 tablet 2   nitroGLYCERIN (NITROSTAT) 0.4 MG SL tablet Place 1 tablet (0.4 mg total) under the tongue every 5 (five) minutes x 3 doses as needed for chest pain. (Patient not taking: Reported on 05/27/2022) 90 tablet 3   NON FORMULARY Take 1 tablet by mouth daily. OTC Nerve Vive     oxyCODONE-acetaminophen (PERCOCET/ROXICET) 5-325 MG tablet Take 1 tablet by mouth at bedtime as needed for severe pain (may repeat 1 additional tablet if needed for severe pain only) 60 tablet 0   phenytoin (DILANTIN) 100 MG ER capsule Take 2 capsules by mouth every morning 180 capsule 3   sacubitril-valsartan (ENTRESTO) 24-26 MG Take 1 tablet by mouth 2 (two) times daily. 180 tablet 1   Semaglutide,0.25 or 0.5MG /DOS, (OZEMPIC, 0.25 OR 0.5 MG/DOSE,) 2 MG/3ML SOPN Inject 0.5 mg into the skin once a week. 3 mL 0   No current facility-administered medications for this visit.   Family History  Problem Relation Age of Onset   Breast cancer Mother        dx in her 34s-60s   Bone cancer Mother    Diabetes Father    Heart disease Father    Diabetes Brother    Diabetes Paternal  Uncle    Social History   Socioeconomic History   Marital status: Single    Spouse name: Not on file   Number of children: 2   Years of education: 11   Highest education level: Not on file  Occupational History    Employer: [employer]  Tobacco Use   Smoking status: Every Day    Packs/day: 1.00    Years: 33.00    Additional pack years: 0.00    Total pack years: 33.00    Types: Cigarettes   Smokeless tobacco: Never   Tobacco comments:    Less than 1 pack per day.  Vaping Use   Vaping Use: Never used  Substance and Sexual Activity   Alcohol use: Not Currently   Drug use: Not Currently    Types: Marijuana    Comment: Marijuana daily   Sexual activity: Not on file  Other Topics  Concern   Not on file  Social History Narrative   [place]. He does not work now.    Right handed      Highest level of edu- 11th grade      Lives in one story home. Steps to enter home   Social Determinants of Health   Financial Resource Strain: High Risk (01/12/2022)   Overall Financial Resource Strain (CARDIA)    Difficulty of Paying Living Expenses: Hard  Food Insecurity: Food Insecurity Present (01/12/2022)   Hunger Vital Sign    Worried About Running Out of Food in the Last Year: Often true    Ran Out of Food in the Last Year: Often true  Transportation Needs: No Transportation Needs (01/12/2022)   PRAPARE - Administrator, Civil Service (Medical): No    Lack of Transportation (Non-Medical): No  Physical Activity: Sufficiently Active (01/12/2022)   Exercise Vital Sign    Days of Exercise per Week: 7 days    Minutes of Exercise per Session: 40 min  Stress: No Stress Concern Present (01/12/2022)   [employer] of Occupational Health - Occupational Stress Questionnaire    Feeling of Stress : Not at all  Social Connections: Socially Isolated (01/12/2022)   Social Connection and Isolation Panel [NHANES]    Frequency of Communication with Friends and Family: More than three times a week    Frequency of Social Gatherings with Friends and Family: Once a week    Attends Religious Services: Never    Database administrator or Organizations: No    Attends Engineer, structural: Never    Marital Status: Never married     Objective:  Physical Exam:  Vitals:   11/10/22 0910  BP: 115/76  Pulse: 85  Temp: 97.6 F (36.4 C)  TempSrc: Axillary  SpO2: 100%  Weight: 175 lb 1.6 oz (79.4 kg)  Height: 5\' 11"  (1.803 m)    Constitutional: Thin, well appearing Cardiovascular: RRR, no m/r/g Pulmonary/Chest: Clear bilaterally, decreased air movement throughout the right side  Extremities: Warm, no edema, patellar reflexes 0 to 1+ bilaterally     Assessment & Plan:   Type II diabetes mellitus (HCC) Well controlled on ozempic 0.5 mg once weekly. Hgb A1c today is much improved at 6.8. He is no longer using insulin and does not monitor CBGs at home. No GI side effects with his GLP-1 and weight is stable.  -- Continue ozempic 0.5 mg weekly -- Foot exam today  -- Podiatry referral   Tobacco use Continues to smoke, is interested  in quitting.  Not a candidate for Wellbutrin given history of seizures currently on Dilantin.  He is interested in trying Chantix.  Sent starter pack to pharmacy.  Low-dose CT chest ordered for lung cancer screening.  Peripheral neuropathy Patient has bilateral peripheral neuropathy starting in his toes and extending to his shins. He has decree sensation to light touch with very diminished patellar reflexes bilaterally.  Suspect this is due to to his diabetes which was uncontrolled for many years.  He is doing much better with glycemic control since starting semaglutide and is now off of insulin therapy.  He takes gabapentin 300 mg at bedtime without significant relief.  Will increase this today to 600 mg BID PRN.  HLD (hyperlipidemia) Currently taking atorvastatin 80 mg daily for secondary prevention.  Checking lipid panel today.  Astrocytoma of frontal lobe (HCC) Follows with oncology and neurology, prescribed Dilantin due to history of seizures.  His astrocytoma has been in remission from what I can tell, radiographically stable on most recent MRI.  He has not had any seizures in a couple of years.  Screening for colon cancer Patient continues to decline screening for colon cancer.

## 2022-11-10 NOTE — Assessment & Plan Note (Signed)
Well controlled on ozempic 0.5 mg once weekly. Hgb A1c today is much improved at 6.8. He is no longer using insulin and does not monitor CBGs at home. No GI side effects with his GLP-1 and weight is stable.  -- Continue ozempic 0.5 mg weekly -- Foot exam today  -- Podiatry referral

## 2022-11-10 NOTE — Assessment & Plan Note (Signed)
Currently taking atorvastatin 80 mg daily for secondary prevention.  Checking lipid panel today.

## 2022-11-10 NOTE — Assessment & Plan Note (Signed)
Patient continues to decline screening for colon cancer.

## 2022-11-10 NOTE — Patient Instructions (Signed)
Jonathan Barker,  It was a pleasure to see you today. I have increased your gabapentin to 600 mg and you may take this twice a day as needed. Otherwise, continue your other medications as prescribed. I will call you with your lab results. You will be called to schedule your CT for lung cancer screening.  Follow up with me again in 6 months. If you have any questions or concerns, call our clinic at [phone] or after hours call [phone] and ask for the internal medicine resident on call.   Thank you!  Dr.G

## 2022-11-10 NOTE — Progress Notes (Deleted)
This is a Psychologist, occupational Note.  The care of the patient was discussed with Dr. Antony Contras and the assessment and plan was formulated with their assistance.  Please see their note for official documentation of the patient encounter.   Subjective:   Patient ID: Jonathan Barker male   DOB: 12-29-1965 57 y.o.   MRN: [id number]  HPI: Jonathan Barker is a 57 y.o.   Med refills   For the details of today's visit, please refer to the assessment and plan.     Past Medical History:  Diagnosis Date   ACS (acute coronary syndrome) (HCC)    Astrocytoma of frontal lobe (HCC) 05/31/2013   Claudication (HCC) 09/24/2020   Coronary artery disease    DM (diabetes mellitus) (HCC) 08/09/2011   A1c at diagnosis = 12.4   Family history of breast cancer    GERD (gastroesophageal reflux disease) 08/09/2011   H/O heart artery stent 07/14/2018   Hyperlipidemia    LAD stenosis    Non-ST elevation (NSTEMI) myocardial infarction (HCC) 07/15/2019   Nonsustained ventricular tachycardia (HCC)    NSVT (nonsustained ventricular tachycardia) (HCC)    Screening for colon cancer 09/24/2020   Seizure (HCC) 05/19/2013   Shortness of breath    Current Outpatient Medications  Medication Sig Dispense Refill   aspirin 81 MG chewable tablet Chew 1 tablet (81 mg total) by mouth daily. 90 tablet 3   atorvastatin (LIPITOR) 80 MG tablet TAKE 1 TABLET BY MOUTH DAILY AT 6 PM. 90 tablet 3   blood glucose meter kit and supplies KIT Dispense based on patient and insurance preference. Use up to four times daily as directed. (FOR ICD-9 250.00, 250.01). 1 each 0   Blood Pressure Monitoring (BLOOD PRESSURE CUFF) MISC 1 each by Does not apply route daily as needed. 1 each 0   gabapentin (NEURONTIN) 300 MG capsule Take 1 capsule (300 mg total) by mouth at bedtime. 90 capsule 3   Insulin Pen Needle (PEN NEEDLES) 32G X 4 MM MISC 1 pen by Does not apply route as directed. 100 each 11   metoprolol tartrate (LOPRESSOR) 50 MG  tablet Take 1 tablet (50 mg total) by mouth 2 (two) times daily.HOLD IF SYSTOLIC BP (TOP BLOOD PRESSURE NUMBER) LESS THAN 100 MMHG OR HEART RATE LESS THAN 60 BPM (PULSE). 180 tablet 2   nitroGLYCERIN (NITROSTAT) 0.4 MG SL tablet Place 1 tablet (0.4 mg total) under the tongue every 5 (five) minutes x 3 doses as needed for chest pain. (Patient not taking: Reported on 05/27/2022) 90 tablet 3   NON FORMULARY Take 1 tablet by mouth daily. OTC Nerve Vive     oxyCODONE-acetaminophen (PERCOCET/ROXICET) 5-325 MG tablet Take 1 tablet by mouth at bedtime as needed for severe pain (may repeat 1 additional tablet if needed for severe pain only) 60 tablet 0   phenytoin (DILANTIN) 100 MG ER capsule Take 2 capsules by mouth every morning 180 capsule 3   sacubitril-valsartan (ENTRESTO) 24-26 MG Take 1 tablet by mouth 2 (two) times daily. 180 tablet 1   Semaglutide,0.25 or 0.5MG /DOS, (OZEMPIC, 0.25 OR 0.5 MG/DOSE,) 2 MG/3ML SOPN Inject 0.5 mg into the skin once a week. 3 mL 0   No current facility-administered medications for this visit.   Family History  Problem Relation Age of Onset   Breast cancer Mother        dx in her 62s-60s   Bone cancer Mother    Diabetes Father    Heart disease Father  Diabetes Brother    Diabetes Paternal Uncle    Social History   Socioeconomic History   Marital status: Single    Spouse name: Not on file   Number of children: 2   Years of education: 11   Highest education level: Not on file  Occupational History    Employer: [employer]  Tobacco Use   Smoking status: Every Day    Packs/day: 1.00    Years: 33.00    Additional pack years: 0.00    Total pack years: 33.00    Types: Cigarettes   Smokeless tobacco: Never   Tobacco comments:    Less than 1 pack per day.  Vaping Use   Vaping Use: Never used  Substance and Sexual Activity   Alcohol use: Not Currently   Drug use: Not Currently    Types: Marijuana    Comment: Marijuana daily   Sexual  activity: Not on file  Other Topics Concern   Not on file  Social History Narrative   [place] the [place]. He does not work now.    Right handed      Highest level of edu- 11th grade      Lives in one story home. Steps to enter home   Social Determinants of Health   Financial Resource Strain: High Risk (01/12/2022)   Overall Financial Resource Strain (CARDIA)    Difficulty of Paying Living Expenses: Hard  Food Insecurity: Food Insecurity Present (01/12/2022)   Hunger Vital Sign    Worried About Running Out of Food in the Last Year: Often true    Ran Out of Food in the Last Year: Often true  Transportation Needs: No Transportation Needs (01/12/2022)   PRAPARE - Administrator, Civil Service (Medical): No    Lack of Transportation (Non-Medical): No  Physical Activity: Sufficiently Active (01/12/2022)   Exercise Vital Sign    Days of Exercise per Week: 7 days    Minutes of Exercise per Session: 40 min  Stress: No Stress Concern Present (01/12/2022)   [employer] of Occupational Health - Occupational Stress Questionnaire    Feeling of Stress : Not at all  Social Connections: Socially Isolated (01/12/2022)   Social Connection and Isolation Panel [NHANES]    Frequency of Communication with Friends and Family: More than three times a week    Frequency of Social Gatherings with Friends and Family: Once a week    Attends Religious Services: Never    Database administrator or Organizations: No    Attends Engineer, structural: Never    Marital Status: Never married   Review of Systems: {Review Of Systems:30496} Objective:  Physical Exam: Vitals:   11/10/22 0910  BP: 115/76  Pulse: 85  SpO2: 100%  Weight: 175 lb 1.6 oz (79.4 kg)  Height: 5\' 11"  (1.803 m)    Constitutional: NAD, appears comfortable HEENT: Atraumatic, normocephalic. PERRL, anicteric sclera.  Neck: Supple, trachea midline.  Cardiovascular: RRR, no murmurs, rubs, or gallops.   Pulmonary/Chest: CTAB, no wheezes, rales, or rhonchi. No chest wall abnormalities.  Abdominal: Soft, non tender, non distended. +BS.  GU: ***  Extremities: Warm and well perfused. Distal pulses intact. No edema.  Neurological: A&Ox3, CN II - XII grossly intact.  Skin: No rashes or erythema  Psychiatric: Normal mood and affect  Assessment & Plan:   No problem-specific Assessment & Plan notes found for this encounter.

## 2022-11-10 NOTE — Assessment & Plan Note (Signed)
Follows with oncology and neurology, prescribed Dilantin due to history of seizures.  His astrocytoma has been in remission from what I can tell, radiographically stable on most recent MRI.  He has not had any seizures in a couple of years.

## 2022-11-12 ENCOUNTER — Other Ambulatory Visit (HOSPITAL_COMMUNITY): Payer: Self-pay

## 2022-11-12 ENCOUNTER — Other Ambulatory Visit: Payer: Self-pay | Admitting: Urology

## 2022-11-12 LAB — CMP14 + ANION GAP
ALT: 37 IU/L (ref 0–44)
AST: 25 IU/L (ref 0–40)
Albumin: 4.7 g/dL (ref 3.8–4.9)
Alkaline Phosphatase: 104 IU/L (ref 44–121)
Anion Gap: 20 mmol/L — ABNORMAL HIGH (ref 10.0–18.0)
BUN/Creatinine Ratio: 20 (ref 9–20)
BUN: 18 mg/dL (ref 6–24)
Bilirubin Total: 0.2 mg/dL (ref 0.0–1.2)
CO2: 21 mmol/L (ref 20–29)
Calcium: 9.9 mg/dL (ref 8.7–10.2)
Chloride: 102 mmol/L (ref 96–106)
Creatinine, Ser: 0.9 mg/dL (ref 0.76–1.27)
Globulin, Total: 2.6 g/dL (ref 1.5–4.5)
Glucose: 83 mg/dL (ref 70–99)
Potassium: 4.2 mmol/L (ref 3.5–5.2)
Sodium: 143 mmol/L (ref 134–144)
Total Protein: 7.3 g/dL (ref 6.0–8.5)
eGFR: 101 mL/min/{1.73_m2} (ref >59)

## 2022-11-12 LAB — LIPID PANEL
Chol/HDL Ratio: 3 ratio (ref 0.0–5.0)
Cholesterol, Total: 104 mg/dL (ref 100–199)
HDL: 35 mg/dL — ABNORMAL LOW (ref >39)
LDL Chol Calc (NIH): 51 mg/dL (ref 0–99)
Triglycerides: 91 mg/dL (ref 0–149)
VLDL Cholesterol Cal: 18 mg/dL (ref 5–40)

## 2022-11-12 MED ORDER — OXYCODONE-ACETAMINOPHEN 5-325 MG PO TABS
1.0000 | ORAL_TABLET | Freq: Every evening | ORAL | 0 refills | Status: DC | PRN
  Filled 2022-11-12: qty 60, 30d supply, fill #0

## 2022-11-13 ENCOUNTER — Other Ambulatory Visit (HOSPITAL_COMMUNITY): Payer: Self-pay

## 2022-11-15 ENCOUNTER — Other Ambulatory Visit (HOSPITAL_COMMUNITY): Payer: Self-pay

## 2022-11-15 ENCOUNTER — Other Ambulatory Visit: Payer: Self-pay | Admitting: Cardiology

## 2022-11-16 ENCOUNTER — Other Ambulatory Visit: Payer: Self-pay

## 2022-11-16 ENCOUNTER — Other Ambulatory Visit (HOSPITAL_COMMUNITY): Payer: Self-pay

## 2022-11-16 MED ORDER — CLOPIDOGREL BISULFATE 75 MG PO TABS
75.0000 mg | ORAL_TABLET | Freq: Every day | ORAL | 0 refills | Status: DC
  Filled 2022-11-16: qty 90, 90d supply, fill #0

## 2022-11-23 ENCOUNTER — Ambulatory Visit (HOSPITAL_COMMUNITY): Payer: Medicare HMO

## 2022-11-29 ENCOUNTER — Other Ambulatory Visit (HOSPITAL_COMMUNITY): Payer: Self-pay

## 2022-12-03 ENCOUNTER — Ambulatory Visit (HOSPITAL_COMMUNITY)
Admission: RE | Admit: 2022-12-03 | Discharge: 2022-12-03 | Disposition: A | Discharge status: Home / Self Care | Payer: Medicare HMO | Source: Ambulatory Visit | Attending: Internal Medicine | Admitting: Internal Medicine

## 2022-12-03 DIAGNOSIS — I700 Atherosclerosis of aorta: Secondary | ICD-10-CM | POA: Diagnosis not present

## 2022-12-03 DIAGNOSIS — F17210 Nicotine dependence, cigarettes, uncomplicated: Secondary | ICD-10-CM | POA: Insufficient documentation

## 2022-12-03 DIAGNOSIS — J432 Centrilobular emphysema: Secondary | ICD-10-CM | POA: Insufficient documentation

## 2022-12-03 DIAGNOSIS — Z720 Tobacco use: Secondary | ICD-10-CM | POA: Insufficient documentation

## 2022-12-03 DIAGNOSIS — I2510 Atherosclerotic heart disease of native coronary artery without angina pectoris: Secondary | ICD-10-CM | POA: Insufficient documentation

## 2022-12-03 DIAGNOSIS — Z122 Encounter for screening for malignant neoplasm of respiratory organs: Secondary | ICD-10-CM | POA: Diagnosis not present

## 2022-12-09 ENCOUNTER — Other Ambulatory Visit (HOSPITAL_COMMUNITY): Payer: Self-pay

## 2022-12-13 ENCOUNTER — Telehealth: Payer: Self-pay | Admitting: Internal Medicine

## 2022-12-13 DIAGNOSIS — E1159 Type 2 diabetes mellitus with other circulatory complications: Secondary | ICD-10-CM

## 2022-12-13 NOTE — Telephone Encounter (Signed)
Pt requesting a call back about his test results.  CT CHEST LUNG CA SCREEN LOW DOSE W/O CM (Accession [id number]) (Order [id number]) Imaging Date: 12/03/2022 Department: [facility]-CT IMAGING

## 2022-12-14 ENCOUNTER — Other Ambulatory Visit (HOSPITAL_COMMUNITY): Payer: Self-pay

## 2022-12-14 ENCOUNTER — Other Ambulatory Visit: Payer: Self-pay | Admitting: Internal Medicine

## 2022-12-14 DIAGNOSIS — E1159 Type 2 diabetes mellitus with other circulatory complications: Secondary | ICD-10-CM

## 2022-12-14 MED ORDER — OZEMPIC (0.25 OR 0.5 MG/DOSE) 2 MG/3ML ~~LOC~~ SOPN
0.5000 mg | PEN_INJECTOR | SUBCUTANEOUS | 2 refills | Status: DC
Start: 2022-12-14 — End: 2023-03-11
  Filled 2022-12-14: qty 3, 28d supply, fill #0
  Filled 2022-12-30 – 2023-01-10 (×2): qty 3, 28d supply, fill #1
  Filled 2023-02-07 – 2023-02-14 (×2): qty 3, 28d supply, fill #2

## 2022-12-14 NOTE — Telephone Encounter (Signed)
Called patient with CT results. Advised smoking cessation again. Sent refill for ozempic per request.

## 2022-12-16 ENCOUNTER — Encounter: Payer: Self-pay | Admitting: Pharmacist

## 2022-12-16 NOTE — Progress Notes (Signed)
Reviewed patient's chart to assess for adherence of Ozempic (semaglutide) and atorvastatin. Patient filled 28-day supply of Ozempic on 12/14/22 and 90-day supply on 10/20/22 of atorvastatin. Patient appears compliant to both medications.

## 2022-12-23 ENCOUNTER — Other Ambulatory Visit (HOSPITAL_COMMUNITY): Payer: Self-pay

## 2022-12-23 ENCOUNTER — Telehealth: Payer: Self-pay

## 2022-12-23 ENCOUNTER — Other Ambulatory Visit: Payer: Self-pay | Admitting: Urology

## 2022-12-23 MED ORDER — OXYCODONE-ACETAMINOPHEN 5-325 MG PO TABS
1.0000 | ORAL_TABLET | Freq: Every evening | ORAL | 0 refills | Status: DC | PRN
  Filled 2022-12-24 (×2): qty 60, 30d supply, fill #0

## 2022-12-23 NOTE — Telephone Encounter (Signed)
RN called pateint per Jonathan Fennel, PA-C to inform him that the Rx refill was sent to [facility] and will be available for pick up 12/24/22.  Jonathan Barker was appreciative for the call and verbalized understanding that he is to pick up refill on 12/24/2022.

## 2022-12-24 ENCOUNTER — Other Ambulatory Visit: Payer: Self-pay

## 2022-12-24 ENCOUNTER — Other Ambulatory Visit (HOSPITAL_COMMUNITY): Payer: Self-pay

## 2022-12-30 ENCOUNTER — Other Ambulatory Visit (HOSPITAL_COMMUNITY): Payer: Self-pay

## 2023-01-04 ENCOUNTER — Ambulatory Visit: Payer: Medicare Other | Admitting: Neurology

## 2023-01-04 ENCOUNTER — Encounter: Payer: Self-pay | Admitting: Neurology

## 2023-01-10 ENCOUNTER — Other Ambulatory Visit (HOSPITAL_COMMUNITY): Payer: Self-pay

## 2023-01-11 ENCOUNTER — Other Ambulatory Visit (HOSPITAL_COMMUNITY): Payer: Self-pay

## 2023-01-20 ENCOUNTER — Ambulatory Visit: Payer: Medicare HMO

## 2023-01-20 DIAGNOSIS — Z0000 Encounter for general adult medical examination without abnormal findings: Secondary | ICD-10-CM

## 2023-01-20 NOTE — Patient Instructions (Signed)
Mr. Carignan , Thank you for taking time to come for your Medicare Wellness Visit. I appreciate your ongoing commitment to your health goals. Please review the following plan we discussed and let me know if I can assist you in the future.   Referrals/Orders/Follow-Ups/Clinician Recommendations: none  This is a list of the screening recommended for you and due dates:  Health Maintenance  Topic Date Due   Eye exam for diabetics  Never done   HIV Screening  Never done   Zoster (Shingles) Vaccine (1 of 2) Never done   Stool Blood Test  Never done   Colon Cancer Screening  Never done   Yearly kidney health urinalysis for diabetes  01/18/2017   DTaP/Tdap/Td vaccine (2 - Tdap) 09/08/2021   COVID-19 Vaccine (1) 02/05/2023*   Flu Shot  08/01/2023*   Hemoglobin A1C  05/13/2023   Yearly kidney function blood test for diabetes  11/10/2023   Complete foot exam   11/10/2023   Screening for Lung Cancer  12/03/2023   Medicare Annual Wellness Visit  01/20/2024   Hepatitis C Screening  Completed   HPV Vaccine  Aged Out  *Topic was postponed. The date shown is not the original due date.    Advanced directives: (Copy Requested) Please bring a copy of your health care power of attorney and living will to the office to be added to your chart at your convenience.  Next Medicare Annual Wellness Visit scheduled for next year: Yes  Insert preventive care Attachment reference

## 2023-01-20 NOTE — Progress Notes (Signed)
Subjective:   Jonathan Barker is a 57 y.o. male who presents for Medicare Annual/Subsequent preventive examination.  Visit Complete: Virtual  I connected with  [name] on 01/20/23 by a audio enabled telemedicine application and verified that I am speaking with the correct person using two identifiers.  Patient Location: Home  Provider Location: Office/Clinic  I discussed the limitations of evaluation and management by telemedicine. The patient expressed understanding and agreed to proceed.  Vital Signs: Unable to obtain new vitals due to this being a telehealth visit.  Cardiac Risk Factors include: advanced age (>63men, >47 women);diabetes mellitus;dyslipidemia;male gender     Objective:    Today's Vitals   There is no height or weight on file to calculate BMI.     01/20/2023   11:52 AM 11/10/2022    9:07 AM 05/27/2022   10:58 AM 01/20/2022    9:51 AM 01/12/2022    2:04 PM 01/08/2022    8:17 AM 09/23/2021    9:48 AM  Advanced Directives  Does Patient Have a Medical Advance Directive? Yes Yes No Yes No;Yes No No  Type of Estate agent of [place];Living will Healthcare Power of [place];Living will  Healthcare Power of [place];Living will Living will    Does patient want to make changes to medical advance directive?  No - Patient declined  No - Patient declined     Copy of Healthcare Power of Attorney in Chart? Yes - validated most recent copy scanned in chart (See row information) Yes - validated most recent copy scanned in chart (See row information)  Yes - validated most recent copy scanned in chart (See row information)     Would patient like information on creating a medical advance directive?  No - Patient declined No - Patient declined    No - Patient declined    Current Medications (verified) Outpatient Encounter Medications as of 01/20/2023  Medication Sig   aspirin 81 MG chewable tablet Chew 1 tablet (81 mg total) by mouth daily.    atorvastatin (LIPITOR) 80 MG tablet TAKE 1 TABLET BY MOUTH DAILY AT 6 PM.   blood glucose meter kit and supplies KIT Dispense based on patient and insurance preference. Use up to four times daily as directed. (FOR ICD-9 250.00, 250.01).   Blood Pressure Monitoring (BLOOD PRESSURE CUFF) MISC 1 each by Does not apply route daily as needed.   clopidogrel (PLAVIX) 75 MG tablet Take 1 tablet (75 mg total) by mouth daily (patient needs appointment).   gabapentin (NEURONTIN) 600 MG tablet Take 1 tablet (600 mg total) by mouth 2 (two) times daily.   Insulin Pen Needle (PEN NEEDLES) 32G X 4 MM MISC 1 pen by Does not apply route as directed.   metoprolol tartrate (LOPRESSOR) 50 MG tablet Take 1 tablet (50 mg total) by mouth 2 (two) times daily.HOLD IF SYSTOLIC BP (TOP BLOOD PRESSURE NUMBER) LESS THAN 100 MMHG OR HEART RATE LESS THAN 60 BPM (PULSE).   NON FORMULARY Take 1 tablet by mouth daily. OTC Nerve Vive   oxyCODONE-acetaminophen (PERCOCET/ROXICET) 5-325 MG tablet Take 1 tablet by mouth at bedtime as needed for severe pain (may repeat one additional tablet if needed for severe pain only- Rx will only be refilled every 6 weeks).   phenytoin (DILANTIN) 100 MG ER capsule Take 2 capsules by mouth every morning   sacubitril-valsartan (ENTRESTO) 24-26 MG Take 1 tablet by mouth 2 (two) times daily.   Semaglutide,0.25 or 0.5MG /DOS, (OZEMPIC, 0.25 OR 0.5 MG/DOSE,)  2 MG/3ML SOPN Inject 0.5 mg into the skin once a week.   Varenicline Tartrate, Starter, (CHANTIX STARTING MONTH PAK) 0.5 MG X 11 & 1 MG X 42 TBPK Please take as instructed   nitroGLYCERIN (NITROSTAT) 0.4 MG SL tablet Place 1 tablet (0.4 mg total) under the tongue every 5 (five) minutes x 3 doses as needed for chest pain. (Patient not taking: Reported on 05/27/2022)   No facility-administered encounter medications on file as of 01/20/2023.    Allergies (verified) Patient has no known allergies.   History: Past Medical History:  Diagnosis Date   ACS  (acute coronary syndrome) (HCC)    Astrocytoma of frontal lobe (HCC) 05/31/2013   Claudication (HCC) 09/24/2020   Coronary artery disease    DM (diabetes mellitus) (HCC) 08/09/2011   A1c at diagnosis = 12.4   Family history of breast cancer    GERD (gastroesophageal reflux disease) 08/09/2011   H/O heart artery stent 07/14/2018   Hyperlipidemia    LAD stenosis    Non-ST elevation (NSTEMI) myocardial infarction (HCC) 07/15/2019   Nonsustained ventricular tachycardia (HCC)    NSVT (nonsustained ventricular tachycardia) (HCC)    Screening for colon cancer 09/24/2020   Seizure (HCC) 05/19/2013   Shortness of breath    Past Surgical History:  Procedure Laterality Date   APPENDECTOMY     BRAIN BIOPSY N/A 05/24/2013   Procedure: Craniotomy for open biopsy;  Surgeon: Carmela Hurt, MD;  Location: [facility];  Service: Neurosurgery;  Laterality: N/A;  Craniotomy for open biopsy   CARDIAC CATHETERIZATION     CORONARY ANGIOPLASTY WITH STENT PLACEMENT     CORONARY/GRAFT ACUTE MI REVASCULARIZATION N/A 07/15/2019   Procedure: Coronary/Graft Acute MI Revascularization;  Surgeon: Yates Decamp, MD;  Location: [facility] INVASIVE CV LAB;  Service: Cardiovascular;  Laterality: N/A;   LEFT HEART CATH AND CORONARY ANGIOGRAPHY N/A 07/15/2019   Procedure: LEFT HEART CATH AND CORONARY ANGIOGRAPHY;  Surgeon: Yates Decamp, MD;  Location: [facility] INVASIVE CV LAB;  Service: Cardiovascular;  Laterality: N/A;   Family History  Problem Relation Age of Onset   Breast cancer Mother        dx in her 63s-60s   Bone cancer Mother    Diabetes Father    Heart disease Father    Diabetes Brother    Diabetes Paternal Uncle    Social History   Socioeconomic History   Marital status: Single    Spouse name: Not on file   Number of children: 2   Years of education: 11   Highest education level: Not on file  Occupational History    Employer: [employer]  Tobacco Use   Smoking status: Every Day    Current  packs/day: 1.00    Average packs/day: 1 pack/day for 33.0 years (33.0 ttl pk-yrs)    Types: Cigarettes   Smokeless tobacco: Never   Tobacco comments:    Less than 1 pack per day.  Vaping Use   Vaping status: Never Used  Substance and Sexual Activity   Alcohol use: Not Currently   Drug use: Not Currently    Types: Marijuana    Comment: Marijuana daily   Sexual activity: Not on file  Other Topics Concern   Not on file  Social History Narrative   [place]. He does not work now.    Right handed      Highest level of edu- 11th grade      Lives in one story home.  Steps to enter home   Social Determinants of Health   Financial Resource Strain: Low Risk  (01/20/2023)   Overall Financial Resource Strain (CARDIA)    Difficulty of Paying Living Expenses: Not hard at all  Food Insecurity: Food Insecurity Present (01/20/2023)   Hunger Vital Sign    Worried About Running Out of Food in the Last Year: Sometimes true    Ran Out of Food in the Last Year: Sometimes true  Transportation Needs: No Transportation Needs (01/20/2023)   PRAPARE - Administrator, Civil Service (Medical): No    Lack of Transportation (Non-Medical): No  Physical Activity: Sufficiently Active (01/20/2023)   Exercise Vital Sign    Days of Exercise per Week: 7 days    Minutes of Exercise per Session: 120 min  Stress: No Stress Concern Present (01/20/2023)   [employer] of Occupational Health - Occupational Stress Questionnaire    Feeling of Stress : Not at all  Social Connections: Socially Isolated (01/20/2023)   Social Connection and Isolation Panel [NHANES]    Frequency of Communication with Friends and Family: More than three times a week    Frequency of Social Gatherings with Friends and Family: More than three times a week    Attends Religious Services: Never    Database administrator or Organizations: No    Attends Banker Meetings: Never    Marital Status:  Never married    Tobacco Counseling Ready to quit: Yes Counseling given: Not Answered Tobacco comments: Less than 1 pack per day.   Clinical Intake:  Pre-visit preparation completed: Yes  Pain : No/denies pain     Nutritional Risks: None Diabetes: Yes CBG done?: No Did pt. bring in CBG monitor from home?: No  How often do you need to have someone help you when you read instructions, pamphlets, or other written materials from your doctor or pharmacy?: 1 - Never  Interpreter Needed?: No  Information entered by :: NAllen LPN   Activities of Daily Living    01/20/2023   11:43 AM 11/10/2022    9:07 AM  In your present state of health, do you have any difficulty performing the following activities:  Hearing? 0 0  Vision? 0 0  Difficulty concentrating or making decisions? 0 0  Walking or climbing stairs? 0 0  Dressing or bathing? 0 0  Doing errands, shopping? 0 0  Preparing Food and eating ? N   Using the Toilet? N   In the past six months, have you accidently leaked urine? N   Do you have problems with loss of bowel control? N   Managing your Medications? N   Managing your Finances? N   Housekeeping or managing your Housekeeping? N     Patient Care Team: Reymundo Poll, MD as PCP - General Karel Jarvis Lesle Chris, MD as Consulting Physician (Neurology)  Indicate any recent Medical Services you may have received from other than [facility] providers in the past year (date may be approximate).     Assessment:   This is a routine wellness examination for [place].  Hearing/Vision screen Hearing Screening - Comments:: Denies hearing issues Vision Screening - Comments:: No regular eye exams   Goals Addressed             This Visit's Progress    Patient Stated       01/20/2023, wants to get neuropathy under control       Depression Screen    01/20/2023   11:54  AM 11/10/2022    9:07 AM 01/20/2022    9:50 AM 01/12/2022    2:02 PM 09/23/2021    9:47 AM 09/24/2020     9:22 AM 03/27/2018    2:25 PM  PHQ 2/9 Scores  PHQ - 2 Score 0 0 0 0 0 0 0  PHQ- 9 Score       1    Fall Risk    01/20/2023   11:53 AM 11/10/2022    9:06 AM 01/20/2022    9:50 AM 01/12/2022    2:04 PM 01/08/2022    8:17 AM  Fall Risk   Falls in the past year? 0 0 0 0 0  Number falls in past yr: 0 0 0 0 0  Injury with Fall? 0 0 0 0 0  Risk for fall due to : Medication side effect No Fall Risks No Fall Risks No Fall Risks   Follow up Falls prevention discussed;Falls evaluation completed Falls evaluation completed;Falls prevention discussed Falls evaluation completed;Follow up appointment Falls evaluation completed;Falls prevention discussed     MEDICARE RISK AT HOME: Medicare Risk at Home Any stairs in or around the home?: Yes If so, are there any without handrails?: No Home free of loose throw rugs in walkways, pet beds, electrical cords, etc?: Yes Adequate lighting in your home to reduce risk of falls?: Yes Life alert?: No Use of a cane, walker or w/c?: No Grab bars in the bathroom?: No Shower chair or bench in shower?: No Elevated toilet seat or a handicapped toilet?: Yes  TIMED UP AND GO:  Was the test performed?  No    Cognitive Function:        01/20/2023   11:55 AM 01/12/2022    2:07 PM  6CIT Screen  What Year? 0 points 0 points  What month? 0 points 0 points  What time? 0 points 0 points  Count back from 20 0 points 0 points  Months in reverse 0 points 0 points  Repeat phrase 0 points 0 points  Total Score 0 points 0 points    Immunizations Immunization History  Administered Date(s) Administered   Pneumococcal Polysaccharide-23 09/09/2011   Td 09/09/2011    TDAP status: Due, Education has been provided regarding the importance of this vaccine. Advised may receive this vaccine at local pharmacy or Health Dept. Aware to provide a copy of the vaccination record if obtained from local pharmacy or Health Dept. Verbalized acceptance and understanding.  Flu  Vaccine status: Declined, Education has been provided regarding the importance of this vaccine but patient still declined. Advised may receive this vaccine at local pharmacy or Health Dept. Aware to provide a copy of the vaccination record if obtained from local pharmacy or Health Dept. Verbalized acceptance and understanding.  Pneumococcal vaccine status: Up to date  Covid-19 vaccine status: Declined, Education has been provided regarding the importance of this vaccine but patient still declined. Advised may receive this vaccine at local pharmacy or Health Dept.or vaccine clinic. Aware to provide a copy of the vaccination record if obtained from local pharmacy or Health Dept. Verbalized acceptance and understanding.  Qualifies for Shingles Vaccine? Yes   Zostavax completed No   Shingrix Completed?: No.    Education has been provided regarding the importance of this vaccine. Patient has been advised to call insurance company to determine out of pocket expense if they have not yet received this vaccine. Advised may also receive vaccine at local pharmacy or Health Dept. Verbalized acceptance and understanding.  Screening Tests Health Maintenance  Topic Date Due   OPHTHALMOLOGY EXAM  Never done   HIV Screening  Never done   Zoster Vaccines- Shingrix (1 of 2) Never done   COLON CANCER SCREENING ANNUAL FOBT  Never done   Colonoscopy  Never done   Diabetic kidney evaluation - Urine ACR  01/18/2017   DTaP/Tdap/Td (2 - Tdap) 09/08/2021   COVID-19 Vaccine (1) 02/05/2023 (Originally 01/31/1971)   INFLUENZA VACCINE  08/01/2023 (Originally 12/02/2022)   HEMOGLOBIN A1C  05/13/2023   Diabetic kidney evaluation - eGFR measurement  11/10/2023   FOOT EXAM  11/10/2023   Lung Cancer Screening  12/03/2023   Medicare Annual Wellness (AWV)  01/20/2024   Hepatitis C Screening  Completed   HPV VACCINES  Aged Out    Health Maintenance  Health Maintenance Due  Topic Date Due   OPHTHALMOLOGY EXAM  Never done    HIV Screening  Never done   Zoster Vaccines- Shingrix (1 of 2) Never done   COLON CANCER SCREENING ANNUAL FOBT  Never done   Colonoscopy  Never done   Diabetic kidney evaluation - Urine ACR  01/18/2017   DTaP/Tdap/Td (2 - Tdap) 09/08/2021    Colorectal cancer screening: has the cologuard kit   Lung Cancer Screening: (Low Dose CT Chest recommended if Age 64-80 years, 20 pack-year currently smoking OR have quit w/in 15years.) does qualify.   Lung Cancer Screening Referral: CT scan completed 12/03/2022  Additional Screening:  Hepatitis C Screening: does qualify; Completed 01/20/2022  Vision Screening: Recommended annual ophthalmology exams for early detection of glaucoma and other disorders of the eye. Is the patient up to date with their annual eye exam?  No  Who is the provider or what is the name of the office in which the patient attends annual eye exams? none If pt is not established with a provider, would they like to be referred to a provider to establish care? No .   Dental Screening: Recommended annual dental exams for proper oral hygiene  Diabetic Foot Exam: Diabetic Foot Exam: Completed 11/10/2022  Community Resource Referral / Chronic Care Management: CRR required this visit?  No   CCM required this visit?  No     Plan:     I have personally reviewed and noted the following in the patient's chart:   Medical and social history Use of alcohol, tobacco or illicit drugs  Current medications and supplements including opioid prescriptions. Patient is not currently taking opioid prescriptions. Functional ability and status Nutritional status Physical activity Advanced directives List of other physicians Hospitalizations, surgeries, and ER visits in previous 12 months Vitals Screenings to include cognitive, depression, and falls Referrals and appointments  In addition, I have reviewed and discussed with patient certain preventive protocols, quality metrics, and best  practice recommendations. A written personalized care plan for preventive services as well as general preventive health recommendations were provided to patient.     Barb Merino, LPN   1/61/0960   After Visit Summary: (Pick Up) Due to this being a telephonic visit, with patients personalized plan was offered to patient and patient has requested to Pick up at office.  Nurse Notes: none

## 2023-01-20 NOTE — Addendum Note (Signed)
Addended by: Elisha Ponder E on: 01/20/2023 12:08 PM   Modules accepted: Orders

## 2023-01-21 ENCOUNTER — Other Ambulatory Visit (HOSPITAL_COMMUNITY): Payer: Self-pay

## 2023-01-21 ENCOUNTER — Ambulatory Visit: Payer: Medicare HMO | Admitting: Podiatry

## 2023-01-21 ENCOUNTER — Other Ambulatory Visit (HOSPITAL_BASED_OUTPATIENT_CLINIC_OR_DEPARTMENT_OTHER): Payer: Self-pay

## 2023-01-21 DIAGNOSIS — E1142 Type 2 diabetes mellitus with diabetic polyneuropathy: Secondary | ICD-10-CM | POA: Diagnosis not present

## 2023-01-21 DIAGNOSIS — L600 Ingrowing nail: Secondary | ICD-10-CM | POA: Diagnosis not present

## 2023-01-21 MED ORDER — CEPHALEXIN 500 MG PO CAPS
500.0000 mg | ORAL_CAPSULE | Freq: Three times a day (TID) | ORAL | 0 refills | Status: AC
  Filled 2023-01-21: qty 15, 5d supply, fill #0

## 2023-01-21 NOTE — Patient Instructions (Signed)

## 2023-01-21 NOTE — Progress Notes (Signed)
Subjective:  Patient ID: Jonathan Barker, male    DOB: Jan 05, 1966,  MRN: [id number]  Chief Complaint  Patient presents with   Nail Problem    Patient is requesting bilateral hallux nails removed.    Peripheral Neuropathy    Patient has neuropathy and is currently taking Gabapentin 600mg  BID.     57 y.o. male presents with concern for pain to the bilateral hallux nails.  Wants them to be removed.  Patient also has neuropathy.  He has a history of diabetes type 2 and takes insulin for this.  He takes gabapentin 600 mg twice daily.  Past Medical History:  Diagnosis Date   ACS (acute coronary syndrome) (HCC)    Astrocytoma of frontal lobe (HCC) 05/31/2013   Claudication (HCC) 09/24/2020   Coronary artery disease    DM (diabetes mellitus) (HCC) 08/09/2011   A1c at diagnosis = 12.4   Family history of breast cancer    GERD (gastroesophageal reflux disease) 08/09/2011   H/O heart artery stent 07/14/2018   Hyperlipidemia    LAD stenosis    Non-ST elevation (NSTEMI) myocardial infarction (HCC) 07/15/2019   Nonsustained ventricular tachycardia (HCC)    NSVT (nonsustained ventricular tachycardia) (HCC)    Screening for colon cancer 09/24/2020   Seizure (HCC) 05/19/2013   Shortness of breath     No Known Allergies  ROS: Negative except as per HPI above  Objective:  General: AAO x3, NAD  Dermatological: Incurvation due to fungal dystrophy and thickening is present along the total nail border of the bilateral great toe. There is localized edema without any erythema or increase in warmth around the nail border. There is no drainage or pus. There is no ascending cellulitis. No malodor. No open lesions or pre-ulcerative lesions.    Vascular:  Dorsalis Pedis artery and Posterior Tibial artery pedal pulses are 2/4 bilateral.  Capillary fill time < 3 sec to all digits.   Neruologic: Grossly absent via light touch and protective sensation is absent  Musculoskeletal: No gross boney  pedal deformities bilateral. No pain, crepitus, or limitation noted with foot and ankle range of motion bilateral. Muscular strength 5/5 in all groups tested bilateral.  Gait: Unassisted, Nonantalgic.   No images are attached to the encounter.   Assessment:   1. Ingrown nail of great toe of left foot   2. Ingrown nail of great toe of right foot   3. DM type 2 with diabetic peripheral neuropathy (HCC)      Plan:  Patient was evaluated and treated and all questions answered.  Ingrown Nail, bilaterally secondary to fungal dystrophy and ingrown borders -Patient elects to proceed with minor surgery to remove ingrown toenail today. Consent reviewed and signed by patient. -Ingrown nail excised. See procedure note. -Educated on post-procedure care including soaking. Written instructions provided and reviewed. -Patient to follow up in 2 weeks for nail check. -eRx for cephalexin 500 mg 3 times daily for 5 days for prophylaxis as patient is diabetic  Procedure: Excision of Ingrown Toenail Location: Bilateral 1st toe  total  nail borders. Anesthesia: Lidocaine 1% plain; 1.5 mL and Marcaine 0.5% plain; 1.5 mL, digital block. Skin Prep: Betadine. Dressing: Silvadene; telfa; dry, sterile, compression dressing. Technique: Following skin prep, the toe was exsanguinated and a tourniquet was secured at the base of the toe. The affected nail border was freed, split with a nail splitter, and excised. Chemical matrixectomy was then performed with NaOH and irrigated out with vinegar. The tourniquet was then removed and  sterile dressing applied. Disposition: Patient tolerated procedure well. Patient to return in 2 weeks for follow-up.   # Neuropathic pain secondary to diabetes type 2 -Patient taking gabapentin 6 mg twice daily continue this -Will check coverage for [employer] application if he is approved we will consider this treatment    Return in about 2 weeks (around 02/04/2023) for nail check.           Corinna Gab, DPM [facility] / [facility]

## 2023-01-24 ENCOUNTER — Telehealth: Payer: Self-pay | Admitting: *Deleted

## 2023-01-24 NOTE — Progress Notes (Unsigned)
Care Coordination  Outreach Note  01/24/2023 Name: Jonathan Barker MRN: [id number] DOB: August 17, 1965   Care Coordination Outreach Attempts: An unsuccessful telephone outreach was attempted today to offer the patient information about available care coordination services.  Follow Up Plan:  Additional outreach attempts will be made to offer the patient care coordination information and services.   Encounter Outcome:  No Answer  Gwenevere Ghazi  Care Coordination Care Guide  Direct Dial: [phone]

## 2023-01-25 NOTE — Progress Notes (Signed)
Care Coordination   Note   01/25/2023 Name: Jonathan Barker MRN: [id number] DOB: [birth year]  [name] is a 57 y.o. year old male who sees Reymundo Poll, MD for primary care. I reached out to [name] by phone today to offer care coordination services.  Mr. Bossio was given information about Care Coordination services today including:   The Care Coordination services include support from the care team which includes your Nurse Coordinator, Clinical Social Worker, or Pharmacist.  The Care Coordination team is here to help remove barriers to the health concerns and goals most important to you. Care Coordination services are voluntary, and the patient may decline or stop services at any time by request to their care team member.   Care Coordination Consent Status: Patient agreed to services and verbal consent obtained.   Follow up plan:  Telephone appointment with care coordination team member scheduled for:  01/28/23  Encounter Outcome:  Patient Scheduled  [facility] Coordination Care Guide  Direct Dial: [phone]

## 2023-01-28 ENCOUNTER — Ambulatory Visit: Payer: Self-pay

## 2023-01-28 NOTE — Patient Instructions (Signed)
Visit Information  Thank you for taking time to visit with me today. Please don't hesitate to contact me if I can be of assistance to you.   Following are the goals we discussed today:  - Review mailed resource   If you are experiencing a Mental Health or Behavioral Health Crisis or need someone to talk to, please call 1-800-273-TALK (toll free, 24 hour hotline) go to [facility] [address], [place] [phone]) call 911  The patient verbalized understanding of instructions, educational materials, and care plan provided today and DECLINED offer to receive copy of patient instructions, educational materials, and care plan.   Bevelyn Ngo, BSW, CDP [facility]  [facility], [facility] Social Worker Direct Dial: [phone]  Fax: [phone]

## 2023-01-28 NOTE — Patient Outreach (Signed)
Care Coordination   Initial Visit Note   01/28/2023 Name: Jonathan Barker MRN: [id number] DOB: 06/04/1965  Jonathan Barker is a 57 y.o. year old male who sees Reymundo Poll, MD for primary care. I spoke with  Jonathan Barker by phone today.  What matters to the patients health and wellness today?  Identify resources to assist with food insecurity    Goals Addressed             This Visit's Progress    Care Coordination Activities       Care Coordination Interventions: Discussed the patient received a raise in Disability this year and therefore lost his FNS benefits due to being over income limits Patient reports he does not eat meat certain weeks of the month because he cannot afford more than canned goods Provided the patient with food resources including food pantry locations as well as community meal options in his area         SDOH assessments and interventions completed:  Yes  SDOH Interventions Today    Flowsheet Row Most Recent Value  SDOH Interventions   Food Insecurity Interventions Other (Comment)  [Pt indicates over income for FNS, provided list of food pantries and community meals]  Housing Interventions Intervention Not Indicated  Transportation Interventions Intervention Not Indicated        Care Coordination Interventions:  Yes, provided   Interventions Today    Flowsheet Row Most Recent Value  Chronic Disease   Chronic disease during today's visit Other  [Food Insecurity]  General Interventions   General Interventions Discussed/Reviewed General Interventions Discussed, Set designer resources information on [employer] pantries and community meal options]        Follow up plan:  SW will follow up with the patient over the next three weeks to confirm receipt of resource    Encounter Outcome:  Patient Visit Completed   Bevelyn Ngo, BSW, CDP [facility]  [facility], [facility] Social  Worker Direct Dial: [phone]  Fax: [phone]

## 2023-02-01 NOTE — Progress Notes (Signed)
Internal Medicine Clinic Attending  Case, documentation, and findings reviewed. I agree with the assessment, diagnosis, and plan of care as outlined in the AWV note.     

## 2023-02-02 ENCOUNTER — Other Ambulatory Visit: Payer: Self-pay

## 2023-02-02 ENCOUNTER — Other Ambulatory Visit (HOSPITAL_COMMUNITY): Payer: Self-pay

## 2023-02-02 ENCOUNTER — Other Ambulatory Visit: Payer: Self-pay | Admitting: Urology

## 2023-02-02 MED ORDER — OXYCODONE-ACETAMINOPHEN 5-325 MG PO TABS
1.0000 | ORAL_TABLET | Freq: Every evening | ORAL | 0 refills | Status: DC | PRN
  Filled 2023-02-04: qty 60, 30d supply, fill #0

## 2023-02-03 ENCOUNTER — Other Ambulatory Visit (HOSPITAL_COMMUNITY): Payer: Self-pay

## 2023-02-04 ENCOUNTER — Other Ambulatory Visit (HOSPITAL_COMMUNITY): Payer: Self-pay

## 2023-02-04 ENCOUNTER — Ambulatory Visit (INDEPENDENT_AMBULATORY_CARE_PROVIDER_SITE_OTHER): Payer: Medicare HMO | Admitting: Podiatry

## 2023-02-04 DIAGNOSIS — L600 Ingrowing nail: Secondary | ICD-10-CM | POA: Diagnosis not present

## 2023-02-04 MED ORDER — MUPIROCIN 2 % EX OINT
1.0000 | TOPICAL_OINTMENT | Freq: Every day | CUTANEOUS | 0 refills | Status: AC
  Filled 2023-02-04: qty 22, 30d supply, fill #0

## 2023-02-04 MED ORDER — CEPHALEXIN 500 MG PO CAPS
500.0000 mg | ORAL_CAPSULE | Freq: Three times a day (TID) | ORAL | 0 refills | Status: AC
  Filled 2023-02-04: qty 15, 5d supply, fill #0

## 2023-02-04 NOTE — Progress Notes (Signed)
Subjective: Jonathan Barker is a 57 y.o.  male returns to office today for follow up evaluation after having bilateral hallux toal nail ingrown removal with NaOH matrixectomy approximately 2 weeks ago. Patient has not been soaking or putting abx ointment on. Patient denies fevers, chills, nausea, vomiting. Denies any calf pain, chest pain, SOB.   Objective:  Vitals: Reviewed  General: Well developed, nourished, in no acute distress, alert and oriented x3   Dermatology: Skin is warm, dry and supple bilateral. Bilateral  hallux toe nail border appears to be macerated. There is no surrounding erythema, edema, drainage/purulence. The remaining nails appear unremarkable at this time. There are no other lesions or other signs of infection present.  Neurovascular status: Intact. No lower extremity swelling; No pain with calf compression bilateral.  Musculoskeletal: Decreased tenderness to palpation of the 2nd nail beds. Muscular strength within normal limits bilateral.   Assesement and Plan: S/p NaOH  matrixectomy to the  bilaterla 2nd nail total, doing well with concern for mild maceration and incomplete healing on inspection of the right hallux nail  -Continue soaking in epsom salts or vinegar twice a day followed by antibiotic ointment and a band-aid. Can leave uncovered at night. Continue this until completely healed.  -E Rx for cephalexin 500 mg 3 times daily for 5 days due to concern for mild possible post procedure infection due to patient noncompliance with soaking and antibiotic ointment -ER Sterling Pearson ointment to the area daily followed by a Band-Aid -Patient will follow-up in 3 weeks for recheck -Monitor for any signs/symptoms of infection. Call the office immediately if any occur or go directly to the emergency room. Call with any questions/concerns.        Corinna Gab, DPM [facility] / [facility]                   02/04/2023

## 2023-02-04 NOTE — Patient Instructions (Signed)

## 2023-02-07 ENCOUNTER — Other Ambulatory Visit (HOSPITAL_COMMUNITY): Payer: Self-pay

## 2023-02-10 ENCOUNTER — Other Ambulatory Visit (HOSPITAL_COMMUNITY): Payer: Self-pay

## 2023-02-11 ENCOUNTER — Ambulatory Visit: Payer: Self-pay

## 2023-02-11 NOTE — Patient Instructions (Signed)
Visit Information  Thank you for taking time to visit with me today. Please don't hesitate to contact me if I can be of assistance to you.   Following are the goals we discussed today:   Goals Addressed             This Visit's Progress    Care Coordination Activities       Care Coordination Interventions: Discussed the patient has not received mailed resources SW mailed a education on food resource options in his area         Our next appointment is by telephone on 03/04/23 at 1:45  Please call the care guide team at [phone] if you need to cancel or reschedule your appointment.   If you are experiencing a Mental Health or Behavioral Health Crisis or need someone to talk to, please call 1-800-273-TALK (toll free, 24 hour hotline) go to [facility] [address], [place] [phone]) call 911  The patient verbalized understanding of instructions, educational materials, and care plan provided today and DECLINED offer to receive copy of patient instructions, educational materials, and care plan.   Bevelyn Ngo, BSW, CDP [facility]  [facility], [facility] Social Worker Direct Dial: [phone]  Fax: [phone]

## 2023-02-11 NOTE — Patient Outreach (Signed)
Care Coordination   Follow Up Visit Note   02/11/2023 Name: Jonathan Barker MRN: [id number] DOB: Oct 24, 1965  Jonathan Barker is a 57 y.o. year old male who sees Reymundo Poll, MD for primary care. I spoke with  Jonathan Barker by phone today.  What matters to the patients health and wellness today?  Identify resources to assist with food insecurity    Goals Addressed             This Visit's Progress    Care Coordination Activities       Care Coordination Interventions: Discussed the patient has not received mailed resources SW mailed a education on food resource options in his area         SDOH assessments and interventions completed:  No     Care Coordination Interventions:  Yes, provided   Interventions Today    Flowsheet Row Most Recent Value  Chronic Disease   Chronic disease during today's visit Other  [Food Insecurity]  General Interventions   General Interventions Discussed/Reviewed General Interventions Reviewed, [employer] information on food resources]  Education Interventions   Education Provided Provided Printed Education        Follow up plan:  SW will follow up with the patient over the next three weeks to confirm receipt of mailed resources.    Encounter Outcome:  Patient Visit Completed   Bevelyn Ngo, BSW, CDP [facility]  [facility], [facility] Social Worker Direct Dial: [phone]  Fax: [phone]

## 2023-02-14 ENCOUNTER — Other Ambulatory Visit (HOSPITAL_COMMUNITY): Payer: Self-pay

## 2023-02-15 ENCOUNTER — Other Ambulatory Visit: Payer: Self-pay

## 2023-02-25 ENCOUNTER — Ambulatory Visit: Payer: Medicare HMO | Admitting: Podiatry

## 2023-03-04 ENCOUNTER — Telehealth: Payer: Self-pay

## 2023-03-04 NOTE — Patient Outreach (Signed)
  Care Coordination   03/04/2023 Name: Jonathan Barker MRN: [id number] DOB: 08/26/1965   Care Coordination Outreach Attempts:  An unsuccessful telephone outreach was attempted for a scheduled appointment today.  Follow Up Plan:  Additional outreach attempts will be made to offer the patient care coordination information and services.   Encounter Outcome:  No Answer   Care Coordination Interventions:  No, not indicated    Bevelyn Ngo, BSW, CDP [facility], [facility] Social Worker Direct Dial: [phone]  Fax: [phone]

## 2023-03-11 ENCOUNTER — Ambulatory Visit (INDEPENDENT_AMBULATORY_CARE_PROVIDER_SITE_OTHER): Payer: Medicare HMO | Admitting: Podiatry

## 2023-03-11 ENCOUNTER — Encounter: Payer: Self-pay | Admitting: Podiatry

## 2023-03-11 ENCOUNTER — Other Ambulatory Visit (HOSPITAL_COMMUNITY): Payer: Self-pay

## 2023-03-11 ENCOUNTER — Other Ambulatory Visit: Payer: Self-pay | Admitting: Internal Medicine

## 2023-03-11 DIAGNOSIS — L600 Ingrowing nail: Secondary | ICD-10-CM

## 2023-03-11 DIAGNOSIS — E1142 Type 2 diabetes mellitus with diabetic polyneuropathy: Secondary | ICD-10-CM | POA: Diagnosis not present

## 2023-03-11 DIAGNOSIS — Z794 Long term (current) use of insulin: Secondary | ICD-10-CM

## 2023-03-11 NOTE — Progress Notes (Signed)
Subjective: NICKAN THEODOROU is a 57 y.o.  male  returns to office today for follow up evaluation after having bilateral Hallux total nail ingrown removal with phenol and alcohol matrixectomy approximately 3 weeks ago. Patient has been soaking using epsom salts but stopped after scabbed up and applying topical antibiotic covered with bandaid daily. Patient denies fevers, chills, nausea, vomiting. Denies any calf pain, chest pain, SOB.   Objective:  Vitals: Reviewed  General: Well developed, nourished, in no acute distress, alert and oriented x3   Dermatology: Skin is warm, dry and supple bilateral. bilateral hallux nail bed appears to be clean, dry, with mild granular tissue and surrounding scab. There is no surrounding erythema, edema, drainage/purulence. The remaining nails appear unremarkable at this time. There are no other lesions or other signs of infection present.  Neurovascular status: Intact. No lower extremity swelling; No pain with calf compression bilateral.  Musculoskeletal: Decreased tenderness to palpation of the bilateral hallux nail bed. Muscular strength within normal limits bilateral.   Assesement and Plan: S/p matrixectomy to the  bilateral hallux nail total, doing well.   -Continue soaking in epsom salts twice a day followed by antibiotic ointment and a band-aid. Can leave uncovered at night. Continue this until completely healed.  -If the area has not healed in 2 weeks, call the office for follow-up appointment, or sooner if any problems arise.  -Monitor for any signs/symptoms of infection. Call the office immediately if any occur or go directly to the emergency room. Call with any questions/concerns.        Corinna Gab, DPM [facility] / [facility]                   03/11/2023

## 2023-03-14 ENCOUNTER — Ambulatory Visit: Payer: Self-pay | Admitting: Licensed Clinical Social Worker

## 2023-03-14 ENCOUNTER — Other Ambulatory Visit (HOSPITAL_COMMUNITY): Payer: Self-pay

## 2023-03-14 MED ORDER — OZEMPIC (0.25 OR 0.5 MG/DOSE) 2 MG/3ML ~~LOC~~ SOPN
0.5000 mg | PEN_INJECTOR | SUBCUTANEOUS | 2 refills | Status: DC
  Filled 2023-03-14: qty 3, 28d supply, fill #0
  Filled 2023-04-05 (×2): qty 3, 28d supply, fill #1
  Filled 2023-05-09: qty 3, 28d supply, fill #2

## 2023-03-14 NOTE — Patient Instructions (Signed)
Visit Information  Thank you for taking time to visit with me today. Please don't hesitate to contact me if I can be of assistance to you.   Following are the goals we discussed today:   Goals Addressed             This Visit's Progress    COMPLETED: Care Coordination Activities       Care Coordination Interventions: Discussed the patient has not received mailed resources SW mailed a education on food resource options in his area        No further follow up Patient did not receive the food pantry resources sw will email to [email or web address]   Please call the care guide team at [phone] if you need to cancel or reschedule your appointment.   If you are experiencing a Mental Health or Behavioral Health Crisis or need someone to talk to, please call the Suicide and Crisis Lifeline: 988 go to [facility] [address], [place] [phone]) call 911  The patient verbalized understanding of instructions, educational materials, and care plan provided today and DECLINED offer to receive copy of patient instructions, educational materials, and care plan.   Jeanie Cooks, PhD [facility], [facility] Social Worker Direct Dial: [phone]  Fax: [phone]

## 2023-03-14 NOTE — Telephone Encounter (Signed)
I called pt to schedule his next appt (January); call transferred to the front office.Appt schedule 05/18/23 with his PCP.

## 2023-03-14 NOTE — Patient Outreach (Signed)
  Care Coordination   Follow Up Visit Note   03/14/2023 Name: Jonathan Barker MRN: [id number] DOB: 1966-02-14  Jonathan Barker is a 57 y.o. year old male who sees Reymundo Poll, MD for primary care. I spoke with  Jonathan Barker by phone today.  What matters to the patients health and wellness today?  Patient did not receive the food pantry resources sw will email to [email or web address]     Goals Addressed             This Visit's Progress    COMPLETED: Care Coordination Activities       Care Coordination Interventions: Discussed the patient has not received mailed resources SW mailed a education on food resource options in his area         SDOH assessments and interventions completed:  Yes  SDOH Interventions Today    Flowsheet Row Most Recent Value  SDOH Interventions   Food Insecurity Interventions Other (Comment)  [[place] has not recieved the food resources and the Sw will email to patient [email or web address]]  Housing Interventions Intervention Not Indicated  Transportation Interventions Intervention Not Indicated  Utilities Interventions Intervention Not Indicated  Financial Strain Interventions Intervention Not Indicated        Care Coordination Interventions:  Yes, provided  Interventions Today    Flowsheet Row Most Recent Value  General Interventions   General Interventions Discussed/Reviewed General Interventions Discussed, Science writer did not receive the food  pantry resources sw will email to [email or web address]]        Follow up plan: No further intervention required.   Encounter Outcome:  Patient Visit Completed   Jeanie Cooks, PhD [facility], [facility] Social Worker Direct Dial: [phone]  Fax: [phone]

## 2023-03-17 ENCOUNTER — Other Ambulatory Visit (HOSPITAL_COMMUNITY): Payer: Self-pay

## 2023-03-17 ENCOUNTER — Other Ambulatory Visit: Payer: Self-pay | Admitting: Urology

## 2023-03-17 MED ORDER — OXYCODONE-ACETAMINOPHEN 5-325 MG PO TABS
1.0000 | ORAL_TABLET | Freq: Every evening | ORAL | 0 refills | Status: DC | PRN
Start: 2023-03-17 — End: 2023-04-22
  Filled 2023-03-17: qty 60, 30d supply, fill #0

## 2023-03-18 ENCOUNTER — Other Ambulatory Visit (HOSPITAL_COMMUNITY): Payer: Self-pay

## 2023-04-05 ENCOUNTER — Other Ambulatory Visit: Payer: Medicare HMO

## 2023-04-05 ENCOUNTER — Other Ambulatory Visit: Payer: Self-pay

## 2023-04-05 ENCOUNTER — Telehealth: Payer: Self-pay | Admitting: Cardiology

## 2023-04-05 ENCOUNTER — Other Ambulatory Visit (HOSPITAL_COMMUNITY): Payer: Self-pay

## 2023-04-05 ENCOUNTER — Ambulatory Visit (HOSPITAL_COMMUNITY): Payer: Medicare HMO

## 2023-04-05 DIAGNOSIS — Z95820 Peripheral vascular angioplasty status with implants and grafts: Secondary | ICD-10-CM

## 2023-04-05 DIAGNOSIS — I4729 Other ventricular tachycardia: Secondary | ICD-10-CM

## 2023-04-05 DIAGNOSIS — I2510 Atherosclerotic heart disease of native coronary artery without angina pectoris: Secondary | ICD-10-CM

## 2023-04-05 DIAGNOSIS — I252 Old myocardial infarction: Secondary | ICD-10-CM

## 2023-04-05 DIAGNOSIS — I255 Ischemic cardiomyopathy: Secondary | ICD-10-CM

## 2023-04-05 NOTE — Progress Notes (Signed)
New order placed for an echo. Pt had to reschedule echo from today and the current order expires tomorrow 04/06/23.

## 2023-04-05 NOTE — Telephone Encounter (Signed)
Pt had to r/s his echo from today. The order expires tomorrow and next available is Jan 3rd. Can a new order be put in please.

## 2023-04-07 ENCOUNTER — Other Ambulatory Visit (HOSPITAL_COMMUNITY): Payer: Self-pay

## 2023-04-15 ENCOUNTER — Ambulatory Visit: Payer: Self-pay | Admitting: Cardiology

## 2023-04-19 ENCOUNTER — Ambulatory Visit: Payer: Medicare HMO | Attending: Cardiology | Admitting: Cardiology

## 2023-04-20 ENCOUNTER — Encounter: Payer: Self-pay | Admitting: Cardiology

## 2023-04-22 ENCOUNTER — Other Ambulatory Visit: Payer: Self-pay | Admitting: Urology

## 2023-04-22 ENCOUNTER — Other Ambulatory Visit (HOSPITAL_COMMUNITY): Payer: Self-pay

## 2023-04-22 MED ORDER — OXYCODONE-ACETAMINOPHEN 5-325 MG PO TABS
1.0000 | ORAL_TABLET | Freq: Every evening | ORAL | 0 refills | Status: DC | PRN
  Filled 2023-04-29: qty 60, 30d supply, fill #0

## 2023-04-22 NOTE — Telephone Encounter (Signed)
Too soon to refill. This is refilled every 6 weeks. I will send a Rx that can be picked up on 04/29/23.

## 2023-04-25 ENCOUNTER — Other Ambulatory Visit: Payer: Self-pay

## 2023-04-25 ENCOUNTER — Other Ambulatory Visit (HOSPITAL_COMMUNITY): Payer: Self-pay

## 2023-04-28 ENCOUNTER — Other Ambulatory Visit (HOSPITAL_COMMUNITY): Payer: Self-pay

## 2023-04-29 ENCOUNTER — Other Ambulatory Visit (HOSPITAL_COMMUNITY): Payer: Self-pay

## 2023-05-06 ENCOUNTER — Ambulatory Visit (HOSPITAL_COMMUNITY): Payer: Medicare HMO

## 2023-05-07 ENCOUNTER — Ambulatory Visit (HOSPITAL_COMMUNITY)
Admission: EM | Admit: 2023-05-07 | Discharge: 2023-05-07 | Disposition: A | Discharge status: Home / Self Care | Payer: No Typology Code available for payment source | Attending: Emergency Medicine | Admitting: Emergency Medicine

## 2023-05-07 ENCOUNTER — Encounter (HOSPITAL_COMMUNITY): Payer: Self-pay

## 2023-05-07 ENCOUNTER — Ambulatory Visit (INDEPENDENT_AMBULATORY_CARE_PROVIDER_SITE_OTHER): Payer: No Typology Code available for payment source

## 2023-05-07 DIAGNOSIS — S6992XA Unspecified injury of left wrist, hand and finger(s), initial encounter: Secondary | ICD-10-CM

## 2023-05-07 DIAGNOSIS — L539 Erythematous condition, unspecified: Secondary | ICD-10-CM | POA: Diagnosis not present

## 2023-05-07 MED ORDER — KETOROLAC TROMETHAMINE 30 MG/ML IJ SOLN
INTRAMUSCULAR | Status: AC
  Filled 2023-05-07: qty 1

## 2023-05-07 MED ORDER — KETOROLAC TROMETHAMINE 30 MG/ML IJ SOLN
30.0000 mg | Freq: Once | INTRAMUSCULAR | Status: AC
  Administered 2023-05-07: 30 mg via INTRAMUSCULAR

## 2023-05-07 MED ORDER — CEPHALEXIN 500 MG PO CAPS: 500.0000 mg | ORAL_CAPSULE | Freq: Four times a day (QID) | ORAL | 0 refills | Status: AC

## 2023-05-07 NOTE — ED Provider Notes (Addendum)
 [facility]   [id number] 05/07/23 Arrival Time: 1642   Chief Complaint  Patient presents with   Finger Injury    Left index finger injury x2 days    SUBJECTIVE: History from: patient.  Jonathan Barker is a 58 y.o. male  who presents to the urgent care with a complaint of left index finger injury for the past 2 days.  Reportedly slammed the car door on his finger. Has tried OTC medication without relief.  Symptoms are made worse with ROM.  Denied previous symptoms in the past.   Denies fever, chills, fatigue,, nausea, vomiting, diarrhea.  ROS: As per HPI.  All other pertinent ROS negative.      Past Medical History:  Diagnosis Date   ACS (acute coronary syndrome) (HCC)    Astrocytoma of frontal lobe (HCC) 05/31/2013   Claudication (HCC) 09/24/2020   Coronary artery disease    DM (diabetes mellitus) (HCC) 08/09/2011   A1c at diagnosis = 12.4   Family history of breast cancer    GERD (gastroesophageal reflux disease) 08/09/2011   H/O heart artery stent 07/14/2018   Hyperlipidemia    LAD stenosis    Non-ST elevation (NSTEMI) myocardial infarction (HCC) 07/15/2019   Nonsustained ventricular tachycardia (HCC)    NSVT (nonsustained ventricular tachycardia) (HCC)    Screening for colon cancer 09/24/2020   Seizure (HCC) 05/19/2013   Shortness of breath    Past Surgical History:  Procedure Laterality Date   APPENDECTOMY     BRAIN BIOPSY N/A 05/24/2013   Procedure: Craniotomy for open biopsy;  Surgeon: Rockey LITTIE Peru, MD;  Location: [facility] NEURO ORS;  Service: Neurosurgery;  Laterality: N/A;  Craniotomy for open biopsy   CARDIAC CATHETERIZATION     CORONARY ANGIOPLASTY WITH STENT PLACEMENT     CORONARY/GRAFT ACUTE MI REVASCULARIZATION N/A 07/15/2019   Procedure: Coronary/Graft Acute MI Revascularization;  Surgeon: Ladona Heinz, MD;  Location: [facility] INVASIVE CV LAB;  Service: Cardiovascular;  Laterality: N/A;   LEFT HEART CATH AND CORONARY ANGIOGRAPHY N/A 07/15/2019    Procedure: LEFT HEART CATH AND CORONARY ANGIOGRAPHY;  Surgeon: Ladona Heinz, MD;  Location: [facility] INVASIVE CV LAB;  Service: Cardiovascular;  Laterality: N/A;   No Known Allergies No current facility-administered medications on file prior to encounter.   Current Outpatient Medications on File Prior to Encounter  Medication Sig Dispense Refill   aspirin  81 MG chewable tablet Chew 1 tablet (81 mg total) by mouth daily. 90 tablet 3   atorvastatin  (LIPITOR ) 80 MG tablet TAKE 1 TABLET BY MOUTH DAILY AT 6 PM. 90 tablet 3   blood glucose meter kit and supplies KIT Dispense based on patient and insurance preference. Use up to four times daily as directed. (FOR ICD-9 250.00, 250.01). 1 each 0   Blood Pressure Monitoring (BLOOD PRESSURE CUFF) MISC 1 each by Does not apply route daily as needed. 1 each 0   clopidogrel  (PLAVIX ) 75 MG tablet Take 1 tablet (75 mg total) by mouth daily (patient needs appointment). 90 tablet 0   gabapentin  (NEURONTIN ) 600 MG tablet Take 1 tablet (600 mg total) by mouth 2 (two) times daily. 180 tablet 1   Insulin  Pen Needle (PEN NEEDLES) 32G X 4 MM MISC 1 pen by Does not apply route as directed. 100 each 11   metoprolol  tartrate (LOPRESSOR ) 50 MG tablet Take 1 tablet (50 mg total) by mouth 2 (two) times daily.HOLD IF SYSTOLIC BP (TOP BLOOD PRESSURE NUMBER) LESS THAN 100 MMHG OR HEART RATE LESS THAN 60 BPM (  PULSE). 180 tablet 2   mupirocin  ointment (BACTROBAN ) 2 % Apply topically as directed daily. 22 g 0   nitroGLYCERIN  (NITROSTAT ) 0.4 MG SL tablet Place 1 tablet (0.4 mg total) under the tongue every 5 (five) minutes x 3 doses as needed for chest pain. 90 tablet 3   NON FORMULARY Take 1 tablet by mouth daily. OTC Nerve Vive     oxyCODONE -acetaminophen  (PERCOCET/ROXICET) 5-325 MG tablet Take 1 tablet by mouth at bedtime as needed for severe pain (may repeat one additional tablet if needed for severe pain only- Rx will only be refilled every 6 weeks). 60 tablet 0   phenytoin  (DILANTIN )  100 MG ER capsule Take 2 capsules by mouth every morning 180 capsule 3   sacubitril -valsartan  (ENTRESTO ) 24-26 MG Take 1 tablet by mouth 2 (two) times daily. 180 tablet 1   Semaglutide ,0.25 or 0.5MG /DOS, (OZEMPIC , 0.25 OR 0.5 MG/DOSE,) 2 MG/3ML SOPN Inject 0.5 mg into the skin once a week. 3 mL 2   Varenicline  Tartrate, Starter, (CHANTIX  STARTING MONTH PAK) 0.5 MG X 11 & 1 MG X 42 TBPK Please take as instructed 53 each 0   Social History   Socioeconomic History   Marital status: Single    Spouse name: Not on file   Number of children: 2   Years of education: 11   Highest education level: Not on file  Occupational History    Employer: [employer]  Tobacco Use   Smoking status: Every Day    Current packs/day: 1.00    Average packs/day: 1 pack/day for 33.0 years (33.0 ttl pk-yrs)    Types: Cigarettes   Smokeless tobacco: Never   Tobacco comments:    Less than 1 pack per day.  Vaping Use   Vaping status: Never Used  Substance and Sexual Activity   Alcohol use: Not Currently   Drug use: Not Currently    Types: Marijuana    Comment: Marijuana daily   Sexual activity: Not on file  Other Topics Concern   Not on file  Social History Narrative   [place]. He does not work now.    Right handed      Highest level of edu- 11th grade      Lives in one story home. Steps to enter home   Social Drivers of Health   Financial Resource Strain: Low Risk  (03/14/2023)   Overall Financial Resource Strain (CARDIA)    Difficulty of Paying Living Expenses: Not hard at all  Food Insecurity: Food Insecurity Present (03/14/2023)   Hunger Vital Sign    Worried About Running Out of Food in the Last Year: Sometimes true    Ran Out of Food in the Last Year: Sometimes true  Transportation Needs: No Transportation Needs (03/14/2023)   PRAPARE - Administrator, Civil Service (Medical): No    Lack of Transportation (Non-Medical): No  Physical  Activity: Sufficiently Active (01/20/2023)   Exercise Vital Sign    Days of Exercise per Week: 7 days    Minutes of Exercise per Session: 120 min  Stress: No Stress Concern Present (01/20/2023)   [employer] of Occupational Health - Occupational Stress Questionnaire    Feeling of Stress : Not at all  Social Connections: Socially Isolated (01/20/2023)   Social Connection and Isolation Panel [NHANES]    Frequency of Communication with Friends and Family: More than three times a week    Frequency of Social Gatherings with Friends and Family:  More than three times a week    Attends Religious Services: Never    Active Member of Clubs or Organizations: No    Attends Banker Meetings: Never    Marital Status: Never married  Intimate Partner Violence: Not At Risk (03/14/2023)   Humiliation, Afraid, Rape, and Kick questionnaire    Fear of Current or Ex-Partner: No    Emotionally Abused: No    Physically Abused: No    Sexually Abused: No   Family History  Problem Relation Age of Onset   Breast cancer Mother        dx in her 49s-60s   Bone cancer Mother    Diabetes Father    Heart disease Father    Diabetes Brother    Diabetes Paternal Uncle     OBJECTIVE:  Vitals:   05/07/23 1706 05/07/23 1708  BP:  117/82  Pulse:  (!) 104  Resp:  18  Temp:  97.8 F (36.6 C)  TempSrc:  Oral  SpO2:  96%  Weight: 185 lb (83.9 kg)   Height: 6' (1.829 m)      Physical Exam Vitals and nursing note reviewed.  Constitutional:      General: He is not in acute distress.    Appearance: Normal appearance. He is normal weight. He is not ill-appearing, toxic-appearing or diaphoretic.  Cardiovascular:     Rate and Rhythm: Normal rate and regular rhythm.     Pulses: Normal pulses.     Heart sounds: Normal heart sounds. No murmur heard.    No friction rub. No gallop.  Pulmonary:     Effort: Pulmonary effort is normal. No respiratory distress.     Breath sounds: Normal breath sounds.  No stridor. No wheezing, rhonchi or rales.  Chest:     Chest wall: No tenderness.  Musculoskeletal:     Left hand: Swelling and tenderness present.     Comments: Left index finger is without any  obvious deformity compared to the right index finger.  Tenderness and swelling present at the tip of the left index finger.  Erythema present around the nail of the left index finger  Neurological:     Mental Status: He is alert and oriented to person, place, and time.      LABS:  No results found. However, due to the size of the patient record, not all encounters were searched. Please check Results Review for a complete set of results.    RADIOLOGY  DG Finger Index Left Result Date: 05/07/2023 CLINICAL DATA:  Pain after injury EXAM: LEFT INDEX FINGER 3V COMPARISON:  None Available. FINDINGS: There is no evidence of fracture or dislocation. There is no evidence of arthropathy or other focal bone abnormality. Soft tissues are unremarkable. IMPRESSION: No acute osseous abnormality. Electronically Signed   By: Ranell Bring M.D.   On: 05/07/2023 17:50     Left index finger X-ray is negative for bony abnormality including fracture or dislocation.  I have reviewed the x-ray myself and the radiologist interpretation.  I am in agreement with the radiologist interpretation.   ASSESSMENT & PLAN:  1. Injury of finger of left hand, initial encounter   2. Erythema     Meds ordered this encounter  Medications   ketorolac  (TORADOL ) 30 MG/ML injection 30 mg   cephALEXin  (KEFLEX ) 500 MG capsule    Sig: Take 1 capsule (500 mg total) by mouth 4 (four) times daily for 7 days.    Dispense:  28 capsule  Refill:  0   Discharge instructions   Get plenty of rest and push fluids keflex  was prescribed Toradol  IM was given in office Follow RICE instructions as attached Use medications daily for symptom relief Use OTC medications like ibuprofen  or tylenol  as needed fever or pain Call or go to the ED if  you have any new or worsening symptoms such as fever, worsening cough, shortness of breath, chest tightness, chest pain, turning blue, changes in mental status, etc...   Reviewed expectations re: course of current medical issues. Questions answered. Outlined signs and symptoms indicating need for more acute intervention. Patient verbalized understanding. After Visit Summary given.    PDMP reviewed during this encounter.       Pride Gonzales S, FNP 05/07/23 1758    Iysis Germain S, FNP 05/07/23 1801

## 2023-05-07 NOTE — Discharge Instructions (Addendum)
 Get plenty of rest and push fluids keflex  was prescribed Toradol  IM was given in office Follow RICE instructions as attached Use medications daily for symptom relief Use OTC medications like ibuprofen  or tylenol  as needed fever or pain Call or go to the ED if you have any new or worsening symptoms such as fever, worsening cough, shortness of breath, chest tightness, chest pain, turning blue, changes in mental status, etc..SABRA

## 2023-05-07 NOTE — ED Triage Notes (Signed)
 Pt states that he injured his left index finger. X2 days

## 2023-05-09 ENCOUNTER — Ambulatory Visit (HOSPITAL_COMMUNITY): Payer: Medicare HMO

## 2023-05-09 ENCOUNTER — Ambulatory Visit (HOSPITAL_COMMUNITY): Payer: No Typology Code available for payment source | Attending: Cardiology

## 2023-05-09 ENCOUNTER — Other Ambulatory Visit (HOSPITAL_COMMUNITY): Payer: Self-pay

## 2023-05-09 ENCOUNTER — Encounter (HOSPITAL_COMMUNITY): Payer: Self-pay | Admitting: Cardiology

## 2023-05-09 DIAGNOSIS — I252 Old myocardial infarction: Secondary | ICD-10-CM | POA: Diagnosis not present

## 2023-05-09 DIAGNOSIS — I4729 Other ventricular tachycardia: Secondary | ICD-10-CM | POA: Diagnosis not present

## 2023-05-09 DIAGNOSIS — I255 Ischemic cardiomyopathy: Secondary | ICD-10-CM | POA: Diagnosis not present

## 2023-05-09 DIAGNOSIS — I2510 Atherosclerotic heart disease of native coronary artery without angina pectoris: Secondary | ICD-10-CM | POA: Diagnosis not present

## 2023-05-09 DIAGNOSIS — Z95820 Peripheral vascular angioplasty status with implants and grafts: Secondary | ICD-10-CM | POA: Diagnosis not present

## 2023-05-09 LAB — ECHOCARDIOGRAM COMPLETE
Area-P 1/2: 4.54 cm2
S' Lateral: 3.7 cm

## 2023-05-12 ENCOUNTER — Other Ambulatory Visit: Payer: Self-pay | Admitting: Internal Medicine

## 2023-05-18 ENCOUNTER — Encounter: Payer: Medicare HMO | Admitting: Internal Medicine

## 2023-05-18 ENCOUNTER — Ambulatory Visit
Admission: RE | Admit: 2023-05-18 | Discharge: 2023-05-18 | Disposition: A | Discharge status: Home / Self Care | Payer: Medicare HMO | Source: Ambulatory Visit | Attending: Internal Medicine | Admitting: Internal Medicine

## 2023-05-18 DIAGNOSIS — Z9889 Other specified postprocedural states: Secondary | ICD-10-CM | POA: Diagnosis not present

## 2023-05-18 DIAGNOSIS — C711 Malignant neoplasm of frontal lobe: Secondary | ICD-10-CM

## 2023-05-18 MED ORDER — GADOPICLENOL 0.5 MMOL/ML IV SOLN
8.0000 mL | Freq: Once | INTRAVENOUS | Status: AC | PRN
  Administered 2023-05-18: 8 mL via INTRAVENOUS

## 2023-05-20 ENCOUNTER — Ambulatory Visit: Payer: No Typology Code available for payment source | Attending: Cardiology | Admitting: Cardiology

## 2023-05-20 VITALS — BP 118/76 | HR 93 | Resp 16 | Ht 72.0 in | Wt 185.0 lb

## 2023-05-20 DIAGNOSIS — Z720 Tobacco use: Secondary | ICD-10-CM

## 2023-05-20 DIAGNOSIS — I4729 Other ventricular tachycardia: Secondary | ICD-10-CM | POA: Diagnosis not present

## 2023-05-20 DIAGNOSIS — Z95820 Peripheral vascular angioplasty status with implants and grafts: Secondary | ICD-10-CM

## 2023-05-20 DIAGNOSIS — I255 Ischemic cardiomyopathy: Secondary | ICD-10-CM | POA: Diagnosis not present

## 2023-05-20 DIAGNOSIS — E1159 Type 2 diabetes mellitus with other circulatory complications: Secondary | ICD-10-CM

## 2023-05-20 DIAGNOSIS — F17200 Nicotine dependence, unspecified, uncomplicated: Secondary | ICD-10-CM

## 2023-05-20 DIAGNOSIS — F17210 Nicotine dependence, cigarettes, uncomplicated: Secondary | ICD-10-CM

## 2023-05-20 DIAGNOSIS — E782 Mixed hyperlipidemia: Secondary | ICD-10-CM

## 2023-05-20 DIAGNOSIS — IMO0001 Reserved for inherently not codable concepts without codable children: Secondary | ICD-10-CM

## 2023-05-20 DIAGNOSIS — C711 Malignant neoplasm of frontal lobe: Secondary | ICD-10-CM

## 2023-05-20 DIAGNOSIS — I2510 Atherosclerotic heart disease of native coronary artery without angina pectoris: Secondary | ICD-10-CM | POA: Diagnosis not present

## 2023-05-20 DIAGNOSIS — I252 Old myocardial infarction: Secondary | ICD-10-CM | POA: Diagnosis not present

## 2023-05-20 NOTE — Progress Notes (Unsigned)
Cardiology Office Note:  .   ID:  UNK Jonathan Barker, DOB 19-Mar-1966, MRN [id number] PCP:  Reymundo Poll, MD  Former Cardiology Providers: Dr. Yates Decamp  [facility] Providers Cardiologist:  Tessa Lerner, DO , [facility] (established care March 2021) Electrophysiologist:  None  Click to update primary MD,subspecialty MD or APP then REFRESH:1}    Chief Complaint  Patient presents with   Coronary artery disease involving native coronary artery of   Follow-up    History of Present Illness: .   Jonathan Barker is a 58 y.o. Caucasian male whose past medical history and cardiovascular risk factors includes: Hx of non-STEMI, established coronary artery disease with PCI, ischemic cardiomyopathy, hypertension, hyperlipidemia, non- insulin diabetes mellitus type 2, history of nonsustained ventricular tachycardia, active tobacco use, history of astrocytoma.   In March 2021 patient presented to the hospital with chest pain and ruled in for NSTEMI.  He underwent angiography and was noted to have obstructive disease and successfully underwent intervention to the LAD as noted below.  Patient presents today for follow-up.  He denies anginal chest pain or heart failure symptoms.  No hospitalizations for cardiovascular reasons.  Unfortunately, patient still continues to smoke approximately 1 pack/day.  He is accompanied by his son at today's office visit provides verbal consent with having him present.  Since last office visit he had an echocardiogram which notes severely reduced LVEF.  Clinically he is not in overt heart failure.  Patient is compliant with current medical regimen.  Review of Systems: .   Review of Systems  Cardiovascular:  Negative for chest pain, claudication, irregular heartbeat, leg swelling, near-syncope, orthopnea, palpitations, paroxysmal nocturnal dyspnea and syncope.  Respiratory:  Negative for shortness of breath.   Hematologic/Lymphatic: Negative for bleeding  problem.   Studies Reviewed:   EKG: EKG Interpretation Date/Time:  Friday May 20 2023 08:07:25 EST Ventricular Rate:  92 PR Interval:  202 QRS Duration:  96 QT Interval:  360 QTC Calculation: 445 R Axis:   114  Text Interpretation: Normal sinus rhythm Right axis deviation Septal infarct (cited on or before 15-Jul-2019) When compared with ECG of 15-Jul-2019 06:54, No significant change since last tracing Confirmed by Tessa Lerner [phone]) on 05/20/2023 8:19:22 AM  Echocardiogram: 07/15/2019: LVEF 30-35%, see report for additional details  08/26/2020:  Left ventricle cavity is normal in size. Mild concentric hypertrophy of the left ventricle. Mid to distal anteroseptal and apical severe hypokinesis. LVEF 40-45%. Grade 1 diastolic dysfunction, normal LAP.  Mild (Grade I) mitral regurgitation.  No evidence of pulmonary hypertension.   January 2025 LVEF: 25-30%, global hypokinesis Diastolic Function: Grade 1 Right ventricular function and size normal Aorta: Aortic root 40 mm Estimated RAP 3 mmHg    Left Heart Catheterization 07/15/19:  LV: Mid to distal anterior, anterolateral and apical akinesis, EF 35 to at most 40%.  Hand contrast injection, not well visualized.  Normal LVEDP. RCA: Dominant, mild diffuse disease. Left main: Mildly calcified. LAD: Diffusely diseased proximal segment, mid segment had a 90% stenosis.  Mid to distal segment has minimal disease and apical LAD had 99% stenosis.  Successful PTCA and stenting of the proximal/ostial and mid LAD with implantation of a long 3.0 x 38 mm resolute Onyx DES postdilated with 3.5 x 12 mm noncompliant sapphire balloon.  90% stenosis reduced to 0% with TIMI-3 to TIMI-3 flow.  Apical LAD stenosis left alone. Ramus intermediate: Large vessel, ostium has ulcerated appearing 99% stenosis.  Upon angioplasty realized it was also calcified and  probably chronic.  Successful PTCA and stenting with 2 overlapping 2.5 x 15 and distally 2.5 x 13  mm resolute Onyx DES.  9 9% stenosis reduced to 0% with TIMI-3 to TIMI-3 flow. Circumflex: Moderate to large vessel, proximal to mid segment is diffusely diseased 30%.  Large OM1 with proximal long 60% to at most 70% stenosis.  RADIOLOGY: NA  Risk Assessment/Calculations:   N/A   Labs:       Latest Ref Rng & Units 07/16/2019    2:28 AM 07/15/2019    3:10 AM 07/14/2019    9:54 PM  CBC  WBC 4.0 - 10.5 K/uL 21.8  20.2  17.0   Hemoglobin 13.0 - 17.0 g/dL 29.5  62.1  30.8   Hematocrit 39.0 - 52.0 % 42.4  49.7  54.0   Platelets 150 - 400 K/uL 299  330  345        Latest Ref Rng & Units 11/10/2022   10:13 AM 01/20/2022   10:25 AM 03/30/2021   10:16 AM  BMP  Glucose 70 - 99 mg/dL 83  657  846   BUN 6 - 24 mg/dL 18  9  17    Creatinine 0.76 - 1.27 mg/dL 9.62  9.52  8.41   BUN/Creat Ratio 9 - 20 20  12  21    Sodium 134 - 144 mmol/L 143  139  136   Potassium 3.5 - 5.2 mmol/L 4.2  3.9  4.3   Chloride 96 - 106 mmol/L 102  104  100   CO2 20 - 29 mmol/L 21  22  22    Calcium 8.7 - 10.2 mg/dL 9.9  9.5  9.3       Latest Ref Rng & Units 11/10/2022   10:13 AM 01/20/2022   10:25 AM 03/30/2021   10:16 AM  CMP  Glucose 70 - 99 mg/dL 83  324  401   BUN 6 - 24 mg/dL 18  9  17    Creatinine 0.76 - 1.27 mg/dL 0.27  2.53  6.64   Sodium 134 - 144 mmol/L 143  139  136   Potassium 3.5 - 5.2 mmol/L 4.2  3.9  4.3   Chloride 96 - 106 mmol/L 102  104  100   CO2 20 - 29 mmol/L 21  22  22    Calcium 8.7 - 10.2 mg/dL 9.9  9.5  9.3   Total Protein 6.0 - 8.5 g/dL 7.3     Total Bilirubin 0.0 - 1.2 mg/dL 0.2     Alkaline Phos 44 - 121 IU/L 104     AST 0 - 40 IU/L 25     ALT 0 - 44 IU/L 37       Lab Results  Component Value Date   CHOL 104 11/10/2022   HDL 35 (L) 11/10/2022   LDLCALC 51 11/10/2022   TRIG 91 11/10/2022   CHOLHDL 3.0 11/10/2022   No results for input(s): "LIPOA" in the last 8760 hours. No components found for: "NTPROBNP" No results for input(s): "PROBNP" in the last 8760 hours. No  results for input(s): "TSH" in the last 8760 hours.    Physical Exam:    Today's Vitals   05/20/23 0803  BP: 118/76  Pulse: 93  Resp: 16  SpO2: 98%  Weight: 185 lb (83.9 kg)  Height: 6' (1.829 m)   Body mass index is 25.09 kg/m. Wt Readings from Last 3 Encounters:  05/20/23 185 lb (83.9 kg)  05/07/23 185 lb (83.9 kg)  11/10/22 175 lb 1.6 oz (79.4 kg)    Physical Exam  Constitutional: No distress.  Age appropriate, hemodynamically stable.   Neck: No JVD present.  Cardiovascular: Normal rate, regular rhythm, S1 normal, S2 normal, intact distal pulses and normal pulses. Exam reveals no gallop, no S3 and no S4.  Murmur heard. Holosystolic murmur is present with a grade of 3/6 at the apex. Pulmonary/Chest: No stridor. He has no wheezes. He has no rales.  Decreased breath sounds bilaterally, poor inspiratory effort  Abdominal: Soft. Bowel sounds are normal. He exhibits no distension. There is no abdominal tenderness.  Musculoskeletal:        General: No edema.     Cervical back: Neck supple.  Neurological: He is alert and oriented to person, place, and time. He has intact cranial nerves (2-12).  Skin: Skin is warm and moist.     Impression & Recommendation(s):  Impression:   ICD-10-CM   1. Coronary artery disease involving native coronary artery of native heart without angina pectoris  I25.10 EKG 12-Lead    NM PET CT CARDIAC PERFUSION MULTI W/ABSOLUTE BLOODFLOW    Cardiac Stress Test: Informed Consent Details: Physician/Practitioner Attestation; Transcribe to consent form and obtain patient signature    2. Hx of non-ST elevation myocardial infarction (NSTEMI)  I25.2     3. Ischemic cardiomyopathy  I25.5 NM PET CT CARDIAC PERFUSION MULTI W/ABSOLUTE BLOODFLOW    Cardiac Stress Test: Informed Consent Details: Physician/Practitioner Attestation; Transcribe to consent form and obtain patient signature    4. S/P angioplasty with stent  Z95.820     5. Nonsustained ventricular  tachycardia (HCC)  I47.29     6. Mixed hyperlipidemia  E78.2     7. Continuous dependence on cigarette smoking  F17.210     8. Type 2 diabetes mellitus with other circulatory complication, without long-term current use of insulin (HCC)  E11.59     9. Astrocytoma of frontal lobe (HCC)  C71.1        Recommendation(s):  Coronary artery disease involving native coronary artery of native heart without angina pectoris Hx of non-ST elevation myocardial infarction (NSTEMI) Ischemic cardiomyopathy S/P angioplasty with stent Denies anginal chest pain or heart failure symptoms. EKG nonischemic. Echocardiogram notes severely reduced LVEF as of January 2025. No use of sublingual nitroglycerin tablets since the last office visit. Patient has completed dual antiplatelet therapy for greater than 1 year.  Stop Plavix 75 mg p.o. daily.  Continue aspirin 81 mg p.o. daily. Continue atorvastatin 80 mg p.o. daily. Continue Lopressor 50 mg p.o. twice daily. Continue Entresto 24/26 mg p.o. twice daily. Given his history of CAD and reduction in LVEF this shared decision was to proceed with cardiac PET/CT to evaluate for reversible ischemia.  Nonsustained ventricular tachycardia (HCC) Continue beta-blocker therapy. Patient scheduled for cardiac PET/CT.  Mixed hyperlipidemia Currently on Lipitor 80 mg p.o. daily.   He denies myalgia or other side effects. Most recent lipids dated July 2024, independently reviewed as noted above.  Cigarette smoking Tobacco cessation counseling: Currently smoking 1 packs/day   Patient denies claudication Patient is informed to follow-up with PCP and consider lung cancer screening if and when appropriate. Consider screening for peripheral artery disease with ankle-brachial index-defer to PCP.  He is informed of the dangers of tobacco abuse including stroke, cancer, and MI, as well as benefits of tobacco cessation. He is willing to quit at this time. 4 mins were spent  counseling patient cessation techniques. We discussed various methods to help quit smoking,  including deciding on a date to quit, joining a support group, pharmacological agents- nicotine gum/patch/lozenges.  I will reassess his progress at the next follow-up visit  Type 2 diabetes mellitus with other circulatory complication, without long-term current use of insulin (HCC) Hemoglobin A1c 6.8 as of July 2024. Currently on ARNI, statin therapy, Ozempic.  Orders Placed:  Orders Placed This Encounter  Procedures   NM PET CT CARDIAC PERFUSION MULTI W/ABSOLUTE BLOODFLOW    Standing Status:   Future    Expiration Date:   05/19/2024    If indicated for the ordered procedure, I authorize the administration of a radiopharmaceutical per Radiology protocol:   Yes    Preferred Imaging Location:   [facility]   Cardiac Stress Test: Informed Consent Details: Physician/Practitioner Attestation; Transcribe to consent form and obtain patient signature    Physician/Practitioner attestation of informed consent for procedure/surgical case:   I, the physician/practitioner, attest that I have discussed with the patient the benefits, risks, side effects, alternatives, likelihood of achieving goals and potential problems during recovery for the procedure that I have provided informed consent.    Procedure:   PET/CT Stress Test    Indication/Reason:   Coronary Artery Disease, Ischemic Cardiomyopathy   EKG 12-Lead    Final Medication List:   No orders of the defined types were placed in this encounter.   Medications Discontinued During This Encounter  Medication Reason   clopidogrel (PLAVIX) 75 MG tablet Discontinued by provider     Current Outpatient Medications:    aspirin 81 MG chewable tablet, Chew 1 tablet (81 mg total) by mouth daily., Disp: 90 tablet, Rfl: 3   atorvastatin (LIPITOR) 80 MG tablet, TAKE 1 TABLET BY MOUTH DAILY AT 6 PM., Disp: 90 tablet, Rfl: 3   blood glucose meter kit and  supplies KIT, Dispense based on patient and insurance preference. Use up to four times daily as directed. (FOR ICD-9 250.00, 250.01)., Disp: 1 each, Rfl: 0   Blood Pressure Monitoring (BLOOD PRESSURE CUFF) MISC, 1 each by Does not apply route daily as needed., Disp: 1 each, Rfl: 0   cephALEXin (KEFLEX) 500 MG capsule, Take 500 mg by mouth 4 (four) times daily., Disp: , Rfl:    ciclopirox (PENLAC) 8 % solution, 1 application Externally Once a day for 90 days, Disp: , Rfl:    gabapentin (NEURONTIN) 600 MG tablet, Take 1 tablet (600 mg total) by mouth 2 (two) times daily., Disp: 180 tablet, Rfl: 1   Insulin Pen Needle (PEN NEEDLES) 32G X 4 MM MISC, 1 pen by Does not apply route as directed., Disp: 100 each, Rfl: 11   metoprolol tartrate (LOPRESSOR) 50 MG tablet, Take 1 tablet (50 mg total) by mouth 2 (two) times daily.HOLD IF SYSTOLIC BP (TOP BLOOD PRESSURE NUMBER) LESS THAN 100 MMHG OR HEART RATE LESS THAN 60 BPM (PULSE)., Disp: 180 tablet, Rfl: 2   mupirocin ointment (BACTROBAN) 2 %, Apply topically as directed daily., Disp: 22 g, Rfl: 0   nitroGLYCERIN (NITROSTAT) 0.4 MG SL tablet, Place 1 tablet (0.4 mg total) under the tongue every 5 (five) minutes x 3 doses as needed for chest pain., Disp: 90 tablet, Rfl: 3   NON FORMULARY, Take 1 tablet by mouth daily. OTC Nerve Vive, Disp: , Rfl:    oxyCODONE-acetaminophen (PERCOCET/ROXICET) 5-325 MG tablet, Take 1 tablet by mouth at bedtime as needed for severe pain (may repeat one additional tablet if needed for severe pain only- Rx will only be refilled every 6  weeks)., Disp: 60 tablet, Rfl: 0   phenytoin (DILANTIN) 100 MG ER capsule, Take 2 capsules by mouth every morning, Disp: 180 capsule, Rfl: 3   sacubitril-valsartan (ENTRESTO) 24-26 MG, Take 1 tablet by mouth 2 (two) times daily., Disp: 180 tablet, Rfl: 1   Semaglutide,0.25 or 0.5MG /DOS, (OZEMPIC, 0.25 OR 0.5 MG/DOSE,) 2 MG/3ML SOPN, Inject 0.5 mg into the skin once a week., Disp: 3 mL, Rfl: 2    Varenicline Tartrate, Starter, (CHANTIX STARTING MONTH PAK) 0.5 MG X 11 & 1 MG X 42 TBPK, Please take as instructed, Disp: 53 each, Rfl: 0  Consent:   Informed Consent   Shared Decision Making/Informed Consent The risks [chest pain, shortness of breath, cardiac arrhythmias, dizziness, blood pressure fluctuations, myocardial infarction, stroke/transient ischemic attack, nausea, vomiting, allergic reaction, radiation exposure, metallic taste sensation and life-threatening complications (estimated to be 1 in 10,000)], benefits (risk stratification, diagnosing coronary artery disease, treatment guidance) and alternatives of a nuclear stress test were discussed in detail with Mr. Rittenhouse and he agrees to proceed.     Disposition:   6 months sooner if needed.  His questions and concerns were addressed to his satisfaction. He voices understanding of the recommendations provided during this encounter.    Signed, Tessa Lerner, DO, [facility] [place]  [facility]  [address] [place], [place] [address] 05/22/2023 12:00 PM

## 2023-05-20 NOTE — Patient Instructions (Signed)
Medication Instructions:   STOP Clopidogrel (Plavix)  *If you need a refill on your cardiac medications before your next appointment, please call your pharmacy*   Testing/Procedures: How to Prepare for Your Cardiac PET/CT Stress Test:  1. Please do not take these medications before your test:  ~Medications that may interfere with the cardiac pharmacological stress agent (ex. nitrates - including erectile dysfunction medications, isosorbide mononitrate, tamulosin or beta-blockers) the day of the exam. (Erectile dysfunction medication should be held for at least 72 hrs prior to test) ~Theophylline containing medications for 12 hours. ~Dipyridamole 48 hours prior to the test. ~Your remaining medications may be taken with water.  2. Nothing to eat or drink, except water, 3 hours prior to arrival time.   ~ NO caffeine/decaffeinated products, or chocolate 12 hours prior to arrival.  3. NO perfume, cologne or lotion on chest or abdomen area.         - FEMALES - Please avoid wearing dresses to this appointment.  4. Total time is 1 to 2 hours; you may want to bring reading material for the waiting time.  Please report to Radiology at the [facility] Main Entrance 30 minutes early for your test. [address] [place], [place] [address]  Diabetic Preparation: - Hold oral medications. - You may take NPH and Lantus insulin. - Do not take Humalog or Humulin R (Regular Insulin) the day of your test. - Check blood sugars prior to leaving the house. - If able to eat breakfast prior to 3 hour fasting, you may take all medications, including your insulin, - Do not worry if you miss your breakfast dose of insulin - start at your next meal. - Patients who wear a continuous glucose monitor MUST remove the device prior to scanning.  IF YOU THINK YOU MAY BE PREGNANT, OR ARE NURSING PLEASE INFORM THE TECHNOLOGIST.  In preparation for your appointment, medication and supplies will be  purchased.  Appointment availability is limited, so if you need to cancel or reschedule, please call the Radiology Department at [phone] [facility]) OR [phone] [facility])  24 hours in advance to avoid a cancellation fee of $100.00  What to Expect After you Arrive:  Once you arrive and check in for your appointment, you will be taken to a preparation room within the Radiology Department.  A technologist or Nurse will obtain your medical history, verify that you are correctly prepped for the exam, and explain the procedure.  Afterwards,  an IV will be started in your arm and electrodes will be placed on your skin for EKG monitoring during the stress portion of the exam. Then you will be escorted to the PET/CT scanner.  There, staff will get you positioned on the scanner and obtain a blood pressure and EKG.  During the exam, you will continue to be connected to the EKG and blood pressure machines.  A small, safe amount of a radioactive tracer will be injected in your IV to obtain a series of pictures of your heart along with an injection of a stress agent.    After your Exam:  It is recommended that you eat a meal and drink a caffeinated beverage to counter act any effects of the stress agent.  Drink plenty of fluids for the remainder of the day and urinate frequently for the first couple of hours after the exam.  Your doctor will inform you of your test results within 7-10 business days.  For more information and frequently asked questions,  please visit our website : [email or web address]  For questions about your test or how to prepare for your test, please call: Cardiac Imaging Nurse Navigators Office: [phone]     Follow-Up: At [facility], you and your health needs are our priority.  As part of our continuing mission to provide you with exceptional heart care, we have created designated Provider Care Teams.  These Care Teams include your primary Cardiologist  (physician) and Advanced Practice Providers (APPs -  Physician Assistants and Nurse Practitioners) who all work together to provide you with the care you need, when you need it.  We recommend signing up for the patient portal called "MyChart".  Sign up information is provided on this After Visit Summary.  MyChart is used to connect with patients for Virtual Visits (Telemedicine).  Patients are able to view lab/test results, encounter notes, upcoming appointments, etc.  Non-urgent messages can be sent to your provider as well.   To learn more about what you can do with MyChart, go to [email or web address].    Your next appointment:   6 month(s)  Provider:   Tessa Lerner, DO     Other Instructions    1st Floor: - Lobby - Registration  - Pharmacy  - Lab - Cafe  2nd Floor: - PV Lab - Diagnostic Testing (echo, CT, nuclear med)  3rd Floor: - Vacant  4th Floor: - TCTS (cardiothoracic surgery) - AFib Clinic - Structural Heart Clinic - Vascular Surgery  - Vascular Ultrasound  5th Floor: - HeartCare Cardiology (general and EP) - Clinical Pharmacy for coumadin, hypertension, lipid, weight-loss medications, and med management appointments    Valet parking services will be available as well.

## 2023-05-22 ENCOUNTER — Encounter: Payer: Self-pay | Admitting: Cardiology

## 2023-05-23 ENCOUNTER — Inpatient Hospital Stay: Payer: No Typology Code available for payment source | Admitting: Internal Medicine

## 2023-05-23 ENCOUNTER — Inpatient Hospital Stay: Payer: No Typology Code available for payment source | Attending: Internal Medicine

## 2023-05-23 ENCOUNTER — Telehealth: Payer: Self-pay | Admitting: Internal Medicine

## 2023-05-23 DIAGNOSIS — C711 Malignant neoplasm of frontal lobe: Secondary | ICD-10-CM | POA: Diagnosis not present

## 2023-05-23 DIAGNOSIS — G40109 Localization-related (focal) (partial) symptomatic epilepsy and epileptic syndromes with simple partial seizures, not intractable, without status epilepticus: Secondary | ICD-10-CM | POA: Diagnosis not present

## 2023-05-23 NOTE — Telephone Encounter (Signed)
 Jonathan Barker

## 2023-05-23 NOTE — Progress Notes (Signed)
I connected with Jonathan Barker on 05/23/23 at 11:00 AM EST by telephone visit and verified that I am speaking with the correct person using two identifiers.  I discussed the limitations, risks, security and privacy concerns of performing an evaluation and management service by telemedicine and the availability of in-person appointments. I also discussed with the patient that there may be a patient responsible charge related to this service. The patient expressed understanding and agreed to proceed.  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 He continues to have issues with his heart, breathing but no neurologic symptoms.  No seizures or headaches.   Observations: Language and cognition at baseline  Imaging:  [facility] Clinician Interpretation: I have personally reviewed the CNS images as listed.  My interpretation, in the context of the patient's clinical presentation, is stable disease  MR BRAIN W WO CONTRAST Result Date: 05/18/2023 CLINICAL DATA:  Brain/CNS neoplasm, assess treatment response EXAM: MRI HEAD WITHOUT AND WITH CONTRAST TECHNIQUE: Multiplanar, multiecho pulse sequences of the brain and surrounding structures were obtained without and with intravenous contrast. CONTRAST:  8 mL of Vueway. COMPARISON:  MRI head 05/20/2022. FINDINGS: Brain: Similar cystic change in the right frontal lobe subjacent to craniotomy at the site of prior resection. No masslike enhancement in this area. Similar surrounding FLAIR hyperintensity. No midline shift. No evidence of acute infarct, acute hemorrhage or hydrocephalus. Vascular: Major  arterial flow voids are maintained at the skull base. Skull and upper cervical spine: Previous right frontal craniotomy. Otherwise, normal marrow signal. Sinuses/Orbits: Clear sinuses.  No acute orbital findings. Other: No mastoid effusions. IMPRESSION: Similar appearance of the right frontal resection cavity without concerning enhancement. No acute abnormality. Electronically Signed   By: Feliberto Harts M.D.   On: 05/18/2023 10:06   ECHOCARDIOGRAM COMPLETE Result Date: 05/09/2023    ECHOCARDIOGRAM REPORT   Patient Name:   Jonathan Barker Date of Exam: 05/09/2023 Medical Rec #:  [id number]          Height:       72.0 in Accession #:    [id number]         Weight:       185.0 lb Date of Birth:  07-Nov-1965          BSA:          2.061 m Patient Age:    57 years           BP:           115/76 mmHg Patient Gender: M                  HR:           91 bpm. Exam Location:  [address] Procedure: 2D Echo, Cardiac Doppler and Color Doppler Indications:    I25.10 Coronary artery disease; I25.5 Ischemic cardiomyopathy;                 I47.2 Ventricular tachycardia  History:        Patient has prior history of Echocardiogram examinations, most                 recent 08/26/2020. CHF, Previous Myocardial Infarction; Risk  Factors:Dyslipidemia, Diabetes and Current Smoker. Peripheral                 neuropathy.  Sonographer:    Cathie Beams RCS Referring Phys: Reymundo Poll IMPRESSIONS  1. Left ventricular ejection fraction, by estimation, is 25 to 30%. The left ventricle has severely decreased function. The left ventricle demonstrates global hypokinesis. Left ventricular diastolic parameters are consistent with Grade I diastolic dysfunction (impaired relaxation).  2. Right ventricular systolic function is normal. The right ventricular size is normal. Tricuspid regurgitation signal is inadequate for assessing PA pressure.  3. The mitral valve is normal in structure. Trivial mitral valve regurgitation. No  evidence of mitral stenosis.  4. The aortic valve is tricuspid. There is mild calcification of the aortic valve. Aortic valve regurgitation is not visualized. No aortic stenosis is present.  5. Aortic dilatation noted. There is mild dilatation of the aortic root, measuring 40 mm.  6. The inferior vena cava is normal in size with greater than 50% respiratory variability, suggesting right atrial pressure of 3 mmHg. FINDINGS  Left Ventricle: Left ventricular ejection fraction, by estimation, is 25 to 30%. The left ventricle has severely decreased function. The left ventricle demonstrates global hypokinesis. The left ventricular internal cavity size was normal in size. There is no left ventricular hypertrophy. Left ventricular diastolic parameters are consistent with Grade I diastolic dysfunction (impaired relaxation). Right Ventricle: The right ventricular size is normal. No increase in right ventricular wall thickness. Right ventricular systolic function is normal. Tricuspid regurgitation signal is inadequate for assessing PA pressure. Left Atrium: Left atrial size was normal in size. Right Atrium: Right atrial size was normal in size. Pericardium: There is no evidence of pericardial effusion. Mitral Valve: The mitral valve is normal in structure. Trivial mitral valve regurgitation. No evidence of mitral valve stenosis. Tricuspid Valve: The tricuspid valve is normal in structure. Tricuspid valve regurgitation is trivial. Aortic Valve: The aortic valve is tricuspid. There is mild calcification of the aortic valve. Aortic valve regurgitation is not visualized. No aortic stenosis is present. Pulmonic Valve: The pulmonic valve was normal in structure. Pulmonic valve regurgitation is not visualized. Aorta: Aortic dilatation noted. There is mild dilatation of the aortic root, measuring 40 mm. Venous: The inferior vena cava is normal in size with greater than 50% respiratory variability, suggesting right atrial pressure of 3  mmHg. IAS/Shunts: No atrial level shunt detected by color flow Doppler.  LEFT VENTRICLE PLAX 2D LVIDd:         5.00 cm   Diastology LVIDs:         3.70 cm   LV e' lateral:   7.83 cm/s LV PW:         1.00 cm   LV E/e' lateral: 6.8 LV IVS:        1.00 cm LVOT diam:     2.00 cm LV SV:         43 LV SV Index:   21 LVOT Area:     3.14 cm  RIGHT VENTRICLE RV Basal diam:  2.70 cm RV S prime:     10.33 cm/s TAPSE (M-mode): 1.3 cm LEFT ATRIUM             Index        RIGHT ATRIUM           Index LA diam:        3.80 cm 1.84 cm/m   RA Area:     12.00 cm LA Vol (A2C):  47.7 ml 23.14 ml/m  RA Volume:   27.40 ml  13.29 ml/m LA Vol (A4C):   23.1 ml 11.21 ml/m LA Biplane Vol: 33.4 ml 16.20 ml/m  AORTIC VALVE LVOT Vmax:   68.17 cm/s LVOT Vmean:  46.933 cm/s LVOT VTI:    0.136 m  AORTA Ao Root diam: 4.00 cm Ao Asc diam:  3.50 cm MITRAL VALVE MV Area (PHT): 4.54 cm    SHUNTS MV Decel Time: 167 msec    Systemic VTI:  0.14 m MV E velocity: 53.50 cm/s  Systemic Diam: 2.00 cm MV A velocity: 84.90 cm/s MV E/A ratio:  0.63 Dalton McleanMD Electronically signed by Wilfred Lacy Signature Date/Time: 05/09/2023/2:07:26 PM    Final    DG Finger Index Left Result Date: 05/07/2023 CLINICAL DATA:  Pain after injury EXAM: LEFT INDEX FINGER 3V COMPARISON:  None Available. FINDINGS: There is no evidence of fracture or dislocation. There is no evidence of arthropathy or other focal bone abnormality. Soft tissues are unremarkable. IMPRESSION: No acute osseous abnormality. Electronically Signed   By: Karen Kays M.D.   On: 05/07/2023 17:50   Assessment and Plan: Astrocytoma of frontal lobe (HCC)  Localization-related symptomatic epilepsy and epileptic syndromes with simple partial seizures, not intractable, without status epilepticus (HCC)  Clinically stable today.  MRI brain was reviewed. Recommended continuing imaging surveillance only.   Follow Up Instructions:  We ask that Jonathan Barker return to clinic in 12 months  following next brain MRI, or sooner as needed.  I discussed the assessment and treatment plan with the patient.  The patient was provided an opportunity to ask questions and all were answered.  The patient agreed with the plan and demonstrated understanding of the instructions.    The patient was advised to call back or seek an in-person evaluation if the symptoms worsen or if the condition fails to improve as anticipated.    Henreitta Leber, MD   I provided 22 minutes of non face-to-face telephone visit time during this encounter, and > 50% was spent counseling as documented under my assessment & plan.

## 2023-05-30 ENCOUNTER — Other Ambulatory Visit: Payer: Self-pay | Admitting: Internal Medicine

## 2023-05-30 ENCOUNTER — Other Ambulatory Visit: Payer: Self-pay | Admitting: Cardiology

## 2023-05-30 ENCOUNTER — Other Ambulatory Visit: Payer: Self-pay

## 2023-05-30 ENCOUNTER — Other Ambulatory Visit (HOSPITAL_COMMUNITY): Payer: Self-pay

## 2023-05-30 DIAGNOSIS — I255 Ischemic cardiomyopathy: Secondary | ICD-10-CM

## 2023-05-30 DIAGNOSIS — Z794 Long term (current) use of insulin: Secondary | ICD-10-CM

## 2023-05-30 MED ORDER — OZEMPIC (0.25 OR 0.5 MG/DOSE) 2 MG/3ML ~~LOC~~ SOPN
0.5000 mg | PEN_INJECTOR | SUBCUTANEOUS | 2 refills | Status: DC
  Filled 2023-05-30: qty 3, 28d supply, fill #0

## 2023-05-30 NOTE — Telephone Encounter (Signed)
Medication sent to pharmacy

## 2023-05-31 ENCOUNTER — Encounter: Payer: Self-pay | Admitting: Internal Medicine

## 2023-05-31 ENCOUNTER — Ambulatory Visit: Payer: No Typology Code available for payment source | Admitting: Internal Medicine

## 2023-05-31 ENCOUNTER — Other Ambulatory Visit: Payer: Self-pay

## 2023-05-31 ENCOUNTER — Other Ambulatory Visit (HOSPITAL_COMMUNITY): Payer: Self-pay

## 2023-05-31 VITALS — BP 119/86 | HR 104 | Temp 97.7°F | Ht 72.0 in | Wt 183.5 lb

## 2023-05-31 DIAGNOSIS — I5022 Chronic systolic (congestive) heart failure: Secondary | ICD-10-CM | POA: Diagnosis not present

## 2023-05-31 DIAGNOSIS — Z7985 Long-term (current) use of injectable non-insulin antidiabetic drugs: Secondary | ICD-10-CM | POA: Diagnosis not present

## 2023-05-31 DIAGNOSIS — Z794 Long term (current) use of insulin: Secondary | ICD-10-CM

## 2023-05-31 DIAGNOSIS — F17210 Nicotine dependence, cigarettes, uncomplicated: Secondary | ICD-10-CM | POA: Diagnosis not present

## 2023-05-31 DIAGNOSIS — E1159 Type 2 diabetes mellitus with other circulatory complications: Secondary | ICD-10-CM

## 2023-05-31 DIAGNOSIS — C711 Malignant neoplasm of frontal lobe: Secondary | ICD-10-CM

## 2023-05-31 DIAGNOSIS — Z720 Tobacco use: Secondary | ICD-10-CM

## 2023-05-31 LAB — POCT GLYCOSYLATED HEMOGLOBIN (HGB A1C): Hemoglobin A1C: 7.4 % — AB (ref 4.0–5.6)

## 2023-05-31 LAB — GLUCOSE, CAPILLARY: Glucose-Capillary: 240 mg/dL — ABNORMAL HIGH (ref 70–99)

## 2023-05-31 MED ORDER — ENTRESTO 24-26 MG PO TABS
1.0000 | ORAL_TABLET | Freq: Two times a day (BID) | ORAL | 3 refills | Status: DC
  Filled 2023-05-31: qty 180, 90d supply, fill #0
  Filled 2023-09-08: qty 180, 90d supply, fill #1

## 2023-05-31 MED ORDER — EMPAGLIFLOZIN 10 MG PO TABS
10.0000 mg | ORAL_TABLET | Freq: Every day | ORAL | 3 refills | Status: DC
  Filled 2023-05-31: qty 30, 30d supply, fill #0
  Filled 2023-07-13: qty 30, 30d supply, fill #1
  Filled 2023-09-02 – 2023-09-05 (×2): qty 30, 30d supply, fill #2
  Filled 2023-10-04 (×2): qty 30, 30d supply, fill #3

## 2023-05-31 MED ORDER — SEMAGLUTIDE (1 MG/DOSE) 4 MG/3ML ~~LOC~~ SOPN
1.0000 mg | PEN_INJECTOR | SUBCUTANEOUS | 3 refills | Status: DC
  Filled 2023-05-31: qty 3, 28d supply, fill #0
  Filled 2023-07-04: qty 3, 28d supply, fill #1
  Filled 2023-08-01 (×2): qty 3, 28d supply, fill #2
  Filled 2023-08-22: qty 3, 28d supply, fill #3

## 2023-05-31 NOTE — Assessment & Plan Note (Signed)
Last echo was January 2025 with EF of 25 to 30% and global hypokinesis.  He followed with cardiology this month.  Plavix was stopped at visit January 17 with cardiology.  He is to continue aspirin and atorvastatin 80 mg.  Medications also include metoprolol tartrate 50 mg twice a day and Entresto 24-26 mg twice a day with history of CAD and continued reduction in ejection fraction cardiology decided to proceed with cardiac PET/CT to evaluate for reversible ischemia.  He is NYHA class I.  Denies shortness of breath with ambulation and does not have orthopnea or PND. Weight is up about 8-10 lbs from July 2024. P: Continue Entresto and metoprolol PET/CT per cardiology

## 2023-05-31 NOTE — Progress Notes (Signed)
Subjective:  CC: diabetes  HPI:  [name] is a 58 y.o. male with a past medical history of diabetes, HLD, HFrEF with ischemic cardiomyopathy, astrocytoma of frontal lobe and epilepsy who presents today for diabetes.   Please see problem based assessment and plan for additional details.  Past Medical History:  Diagnosis Date   ACS (acute coronary syndrome) (HCC)    Astrocytoma of frontal lobe (HCC) 05/31/2013   Claudication (HCC) 09/24/2020   Coronary artery disease    DM (diabetes mellitus) (HCC) 08/09/2011   A1c at diagnosis = 12.4   Family history of breast cancer    GERD (gastroesophageal reflux disease) 08/09/2011   H/O heart artery stent 07/14/2018   Hyperlipidemia    LAD stenosis    Non-ST elevation (NSTEMI) myocardial infarction (HCC) 07/15/2019   Nonsustained ventricular tachycardia (HCC)    NSVT (nonsustained ventricular tachycardia) (HCC)    Screening for colon cancer 09/24/2020   Seizure (HCC) 05/19/2013   Shortness of breath    Medications: Entresto 24-26 mg twice daily- no regular refills Metoprolol tartrate 50 mg twice daily  Phenytoin 100 mg-no regular refills  Semaglutide 0.5 mg weekly Gabapentin 600 mg twice daily  Aspirin 81 mg Atorvastatin 80 mg  Family History  Problem Relation Age of Onset   Breast cancer Mother        dx in her 61s-60s   Bone cancer Mother    Diabetes Father    Heart disease Father    Diabetes Brother    Diabetes Paternal Uncle     Social History   Socioeconomic History   Marital status: Single    Spouse name: Not on file   Number of children: 2   Years of education: 11   Highest education level: Not on file  Occupational History    Employer: [employer]  Tobacco Use   Smoking status: Every Day    Current packs/day: 1.00    Average packs/day: 1 pack/day for 33.0 years (33.0 ttl pk-yrs)    Types: Cigarettes   Smokeless tobacco: Never   Tobacco comments:    Less than 1 pack per  day.  Vaping Use   Vaping status: Never Used  Substance and Sexual Activity   Alcohol use: Not Currently   Drug use: Not Currently    Types: Marijuana    Comment: Marijuana daily   Sexual activity: Not on file  Other Topics Concern   Not on file  Social History Narrative   [place] He does not work now.    Right handed      Highest level of edu- 11th grade      Lives in one story home. Steps to enter home   Social Drivers of Health   Financial Resource Strain: Low Risk  (03/14/2023)   Overall Financial Resource Strain (CARDIA)    Difficulty of Paying Living Expenses: Not hard at all  Food Insecurity: Food Insecurity Present (03/14/2023)   Hunger Vital Sign    Worried About Running Out of Food in the Last Year: Sometimes true    Ran Out of Food in the Last Year: Sometimes true  Transportation Needs: No Transportation Needs (03/14/2023)   PRAPARE - Administrator, Civil Service (Medical): No    Lack of Transportation (Non-Medical): No  Physical Activity: Sufficiently Active (01/20/2023)   Exercise Vital Sign    Days of Exercise per Week: 7 days    Minutes of Exercise per  Session: 120 min  Stress: No Stress Concern Present (01/20/2023)   [employer] of Occupational Health - Occupational Stress Questionnaire    Feeling of Stress : Not at all  Social Connections: Socially Isolated (01/20/2023)   Social Connection and Isolation Panel [NHANES]    Frequency of Communication with Friends and Family: More than three times a week    Frequency of Social Gatherings with Friends and Family: More than three times a week    Attends Religious Services: Never    Database administrator or Organizations: No    Attends Banker Meetings: Never    Marital Status: Never married  Intimate Partner Violence: Not At Risk (03/14/2023)   Humiliation, Afraid, Rape, and Kick questionnaire    Fear of Current or Ex-Partner: No    Emotionally Abused:  No    Physically Abused: No    Sexually Abused: No    Review of Systems: ROS negative except for what is noted on the assessment and plan.  Objective:   Vitals:   05/31/23 0907 05/31/23 0938  BP: 137/83 119/86  Pulse: (!) 103 (!) 104  Temp: 97.7 F (36.5 C)   TempSrc: Oral   SpO2: 98%   Weight: 183 lb 8 oz (83.2 kg)   Height: 6' (1.829 m)     Physical Exam: Constitutional: well-appearing, in no acute distress Cardiovascular: regular rate and rhythm, no m/r/g Pulmonary/Chest: normal work of breathing on room air, lungs clear to auscultation bilaterally MSK: normal bulk and tone Neurological: alert & oriented x 3, normal gait Skin: warm and dry  Assessment & Plan:  Type II diabetes mellitus (HCC) Last A1c was 6.8 in July 2024.  Current medications include semaglutide 0.5 mg weekly.  He self discontinued metformin at prior visit and A1c was well-controlled.  In the past he was also prescribed empagliflozin and combination pill with metformin but it appears that his insurance did not cover this so medication was discontinued. A: A1c elevated to 7.4 today.  I will increase semaglutide to 1 mg weekly.  I am also sending in Jardiance 10 mg as this would also help with his heart failure history.  If insurance still does not cover this medication would send in metformin and talk with Durward Mallard about patient assistance programs. P: Urine microalbumin to creatinine ratio- unable to void at this visit. Needs to follow-up with ophthalmology Increase glipizide to 1 mg weekly Start Jardiance 10 mg  Chronic HFrEF (heart failure with reduced ejection fraction) (HCC) Last echo was January 2025 with EF of 25 to 30% and global hypokinesis.  He followed with cardiology this month.  Plavix was stopped at visit January 17 with cardiology.  He is to continue aspirin and atorvastatin 80 mg.  Medications also include metoprolol tartrate 50 mg twice a day and Entresto 24-26 mg twice a day with history  of CAD and continued reduction in ejection fraction cardiology decided to proceed with cardiac PET/CT to evaluate for reversible ischemia.  He is NYHA class I.  Denies shortness of breath with ambulation and does not have orthopnea or PND. Weight is up about 8-10 lbs from July 2024. P: Continue Entresto and metoprolol PET/CT per cardiology  Tobacco use Currently smokes 1 pack/day.  He is decreasing amount using Zen packs at different strengths.  He did not find Chantix beneficial or nicotine replacement therapy. He had lung cancer screening August 2024 that showed benign appearance of nodules with recommendations to repeat in August 2025.  Astrocytoma of  frontal lobe [facility]) He followed up with oncology, Dr. Barbaraann Cao on January 20.  MRI brain was reviewed and disease ruled to be clinically stable.  I do not see that he is followed with neurology recently and phenytoin refill history suggest nonadherence.  I ask about this and he endorsed taking a dose of phenytoin this morning and is adherent with medication.  I see in the past that he was prescribed 300 mg or 3 tablets daily but this was decreased to 200 mg or 2 tablets daily.  I question if he had extra leftover due to this.  He has not had any seizures in the last several years P: Phenytoin 200 mg daily  Patient discussed with Dr. Hurshel Keys Sharmila Wrobleski, D.O.  [facility]  PGY-3 Pager: [phone]  Phone: [phone] Date 05/31/2023  Time 9:46 AM

## 2023-05-31 NOTE — Assessment & Plan Note (Signed)
He followed up with oncology, Dr. Barbaraann Cao on January 20.  MRI brain was reviewed and disease ruled to be clinically stable.  I do not see that he is followed with neurology recently and phenytoin refill history suggest nonadherence.  I ask about this and he endorsed taking a dose of phenytoin this morning and is adherent with medication.  I see in the past that he was prescribed 300 mg or 3 tablets daily but this was decreased to 200 mg or 2 tablets daily.  I question if he had extra leftover due to this.  He has not had any seizures in the last several years P: Phenytoin 200 mg daily

## 2023-05-31 NOTE — Assessment & Plan Note (Signed)
Currently smokes 1 pack/day.  He is decreasing amount using Zen packs at different strengths.  He did not find Chantix beneficial or nicotine replacement therapy. He had lung cancer screening August 2024 that showed benign appearance of nodules with recommendations to repeat in August 2025.

## 2023-05-31 NOTE — Patient Instructions (Addendum)
Thank you, Mr.Flynt A Riggsbee for allowing [place] to provide your care today.   Diabetes: Your hemoglobin A1c was elevated to 7.4 today.  The goal is at least less than 7 to best prevent complications of your diabetes.  I am increasing Ozempic from 0.5 mg weekly to 1 mg weekly.  I am also starting another medication called Jardiance that is for diabetes and heart failure.  Follow-up in 3 months for repeat A1c.  I have referred you to ophthalmology for diabetic eye exam.  Come back please bring in all of your medications at that appointment so we can review them together.  I think it be a good idea for you to use a pill box if at all possible for medications.  You are having to take a lot of different medications for diabetes heart failure and epilepsy.  These meds are very important and I do not want you to miss any doses.  Please be sure to send in the Cologuard test for colon cancer screening  I have ordered the following labs for you:  Lab Orders         Microalbumin / Creatinine Urine Ratio         Glucose, capillary         POCT glycosylated hemoglobin (Hb A1C)      Referrals ordered today:   Referral Orders         Ambulatory referral to Ophthalmology       I have ordered the following medication/changed the following medications:   Stop the following medications: Medications Discontinued During This Encounter  Medication Reason   cephALEXin (KEFLEX) 500 MG capsule    Varenicline Tartrate, Starter, (CHANTIX STARTING MONTH PAK) 0.5 MG X 11 & 1 MG X 42 TBPK    Semaglutide,0.25 or 0.5MG /DOS, (OZEMPIC, 0.25 OR 0.5 MG/DOSE,) 2 MG/3ML SOPN      Start the following medications: Meds ordered this encounter  Medications   Semaglutide, 1 MG/DOSE, 4 MG/3ML SOPN    Sig: Inject 1 mg into the skin once a week.    Dispense:  3 mL    Refill:  3   empagliflozin (JARDIANCE) 10 MG TABS tablet    Sig: Take 1 tablet (10 mg total) by mouth daily before breakfast.    Dispense:  30 tablet     Refill:  3     Follow up: 3 months   We look forward to seeing you next time. Please call our clinic at [phone] if you have any questions or concerns. The best time to call is Monday-Friday from 9am-4pm, but there is someone available 24/7. If after hours or the weekend, call the main hospital number and ask for the Internal Medicine Resident On-Call. If you need medication refills, please notify your pharmacy one week in advance and they will send [place] a request.   Thank you for trusting me with your care. Wishing you the best!   Rudene Christians, DO [facility]

## 2023-05-31 NOTE — Assessment & Plan Note (Addendum)
Last A1c was 6.8 in July 2024.  Current medications include semaglutide 0.5 mg weekly.  He self discontinued metformin at prior visit and A1c was well-controlled.  In the past he was also prescribed empagliflozin and combination pill with metformin but it appears that his insurance did not cover this so medication was discontinued. A: A1c elevated to 7.4 today.  I will increase semaglutide to 1 mg weekly.  I am also sending in Jardiance 10 mg as this would also help with his heart failure history.  If insurance still does not cover this medication would send in metformin and talk with Durward Mallard about patient assistance programs. P: Urine microalbumin to creatinine ratio- unable to void at this visit. Needs to follow-up with ophthalmology Increase glipizide to 1 mg weekly Start Jardiance 10 mg

## 2023-06-01 ENCOUNTER — Other Ambulatory Visit (HOSPITAL_COMMUNITY): Payer: Self-pay

## 2023-06-01 NOTE — Progress Notes (Signed)
Internal Medicine Clinic Attending  Case discussed with the resident at the time of the visit.  We reviewed the resident's history and exam and pertinent patient test results.  I agree with the assessment, diagnosis, and plan of care documented in the resident's note.    Small correction to resident note: We are increasing patient's semaglutide (not glipizide) to 1mg  weekly

## 2023-06-02 ENCOUNTER — Encounter: Payer: Self-pay | Admitting: Neurology

## 2023-06-03 ENCOUNTER — Other Ambulatory Visit: Payer: Self-pay

## 2023-06-03 ENCOUNTER — Other Ambulatory Visit (HOSPITAL_COMMUNITY): Payer: Self-pay

## 2023-06-08 ENCOUNTER — Other Ambulatory Visit (HOSPITAL_COMMUNITY): Payer: Self-pay

## 2023-06-08 ENCOUNTER — Other Ambulatory Visit: Payer: Self-pay | Admitting: Urology

## 2023-06-08 ENCOUNTER — Other Ambulatory Visit: Payer: Self-pay | Admitting: Internal Medicine

## 2023-06-08 DIAGNOSIS — G6289 Other specified polyneuropathies: Secondary | ICD-10-CM

## 2023-06-08 MED ORDER — OXYCODONE-ACETAMINOPHEN 5-325 MG PO TABS
1.0000 | ORAL_TABLET | Freq: Every evening | ORAL | 0 refills | Status: DC | PRN
  Filled 2023-06-10: qty 60, 30d supply, fill #0

## 2023-06-08 MED ORDER — GABAPENTIN 600 MG PO TABS
600.0000 mg | ORAL_TABLET | Freq: Two times a day (BID) | ORAL | 1 refills | Status: DC
  Filled 2023-06-08: qty 180, 90d supply, fill #0
  Filled 2023-09-05 (×3): qty 180, 90d supply, fill #1

## 2023-06-09 ENCOUNTER — Other Ambulatory Visit (HOSPITAL_COMMUNITY): Payer: Self-pay

## 2023-06-10 ENCOUNTER — Other Ambulatory Visit (HOSPITAL_COMMUNITY): Payer: Self-pay

## 2023-06-16 ENCOUNTER — Other Ambulatory Visit: Payer: Self-pay | Admitting: Neurology

## 2023-06-16 ENCOUNTER — Other Ambulatory Visit (HOSPITAL_COMMUNITY): Payer: Self-pay

## 2023-06-17 ENCOUNTER — Other Ambulatory Visit (HOSPITAL_COMMUNITY): Payer: Self-pay

## 2023-06-17 MED ORDER — PHENYTOIN SODIUM EXTENDED 100 MG PO CAPS
200.0000 mg | ORAL_CAPSULE | Freq: Every morning | ORAL | 0 refills | Status: DC
  Filled 2023-06-17: qty 90, 45d supply, fill #0
  Filled 2023-08-22: qty 90, 45d supply, fill #1

## 2023-06-20 ENCOUNTER — Other Ambulatory Visit (HOSPITAL_COMMUNITY): Payer: Self-pay

## 2023-06-20 ENCOUNTER — Other Ambulatory Visit: Payer: Self-pay

## 2023-06-23 ENCOUNTER — Other Ambulatory Visit (HOSPITAL_COMMUNITY): Payer: Self-pay

## 2023-06-24 ENCOUNTER — Other Ambulatory Visit (HOSPITAL_COMMUNITY): Payer: Self-pay

## 2023-06-27 ENCOUNTER — Other Ambulatory Visit: Payer: Self-pay

## 2023-06-27 ENCOUNTER — Other Ambulatory Visit (HOSPITAL_COMMUNITY): Payer: Self-pay

## 2023-07-04 ENCOUNTER — Encounter (HOSPITAL_COMMUNITY): Payer: Self-pay

## 2023-07-04 ENCOUNTER — Other Ambulatory Visit (HOSPITAL_COMMUNITY): Payer: Self-pay

## 2023-07-05 ENCOUNTER — Telehealth (HOSPITAL_COMMUNITY): Payer: Self-pay | Admitting: Emergency Medicine

## 2023-07-05 NOTE — Telephone Encounter (Signed)
 Reaching out to patient to offer assistance regarding upcoming cardiac imaging study; pt verbalizes understanding of appt date/time, parking situation and where to check in, pre-test NPO status and medications ordered, and verified current allergies; name and call back number provided for further questions should they arise Rockwell Alexandria RN Navigator Cardiac Imaging [facility] [phone] office [phone] cell

## 2023-07-06 ENCOUNTER — Ambulatory Visit (HOSPITAL_COMMUNITY)
Admission: RE | Admit: 2023-07-06 | Discharge: 2023-07-06 | Disposition: A | Discharge status: Home / Self Care | Payer: No Typology Code available for payment source | Source: Ambulatory Visit | Attending: Cardiology | Admitting: Cardiology

## 2023-07-06 DIAGNOSIS — I2510 Atherosclerotic heart disease of native coronary artery without angina pectoris: Secondary | ICD-10-CM | POA: Insufficient documentation

## 2023-07-06 DIAGNOSIS — I255 Ischemic cardiomyopathy: Secondary | ICD-10-CM | POA: Insufficient documentation

## 2023-07-12 ENCOUNTER — Other Ambulatory Visit (HOSPITAL_COMMUNITY): Payer: Self-pay

## 2023-07-13 ENCOUNTER — Other Ambulatory Visit (HOSPITAL_COMMUNITY): Payer: Self-pay

## 2023-07-13 ENCOUNTER — Other Ambulatory Visit: Payer: Self-pay

## 2023-07-19 ENCOUNTER — Telehealth (HOSPITAL_COMMUNITY): Payer: Self-pay | Admitting: *Deleted

## 2023-07-19 NOTE — Telephone Encounter (Signed)
 Reaching out to patient to offer assistance regarding upcoming cardiac imaging study; pt verbalizes understanding of appt date/time, parking situation and where to check in, pre-test NPO status and verified current allergies; name and call back number provided for further questions should they arise  Larey Brick RN Navigator Cardiac Imaging [facility] [phone] office [phone] cell  Patient aware to avoid caffeine 12 hours prior to his cardiac PET scan.

## 2023-07-20 ENCOUNTER — Encounter (HOSPITAL_COMMUNITY)
Admission: RE | Admit: 2023-07-20 | Discharge: 2023-07-20 | Disposition: A | Discharge status: Home / Self Care | Source: Ambulatory Visit | Attending: Cardiology | Admitting: Cardiology

## 2023-07-20 DIAGNOSIS — I2510 Atherosclerotic heart disease of native coronary artery without angina pectoris: Secondary | ICD-10-CM

## 2023-07-20 DIAGNOSIS — I255 Ischemic cardiomyopathy: Secondary | ICD-10-CM | POA: Diagnosis not present

## 2023-07-20 DIAGNOSIS — J439 Emphysema, unspecified: Secondary | ICD-10-CM | POA: Diagnosis not present

## 2023-07-20 LAB — NM PET CT CARDIAC PERFUSION MULTI W/ABSOLUTE BLOODFLOW
MBFR: 2.55
Nuc Rest EF: 30 %
Nuc Stress EF: 40 %
Rest MBF: 0.85 ml/g/min
Rest Nuclear Isotope Dose: 21.4 mCi
ST Depression (mm): 0 mm
Stress MBF: 2.17 ml/g/min
Stress Nuclear Isotope Dose: 21.4 mCi
TID: 1.1

## 2023-07-20 MED ORDER — RUBIDIUM RB82 GENERATOR (RUBYFILL): 21.4000 | PACK | Freq: Once | INTRAVENOUS | Status: DC

## 2023-07-20 MED ORDER — REGADENOSON 0.4 MG/5ML IV SOLN
0.4000 mg | Freq: Once | INTRAVENOUS | Status: AC
  Administered 2023-07-20: 0.4 mg via INTRAVENOUS

## 2023-07-20 MED ORDER — REGADENOSON 0.4 MG/5ML IV SOLN
INTRAVENOUS | Status: AC
  Filled 2023-07-20: qty 5

## 2023-07-22 ENCOUNTER — Other Ambulatory Visit (HOSPITAL_COMMUNITY): Payer: Self-pay

## 2023-07-22 ENCOUNTER — Other Ambulatory Visit: Payer: Self-pay | Admitting: Urology

## 2023-07-23 ENCOUNTER — Other Ambulatory Visit (HOSPITAL_COMMUNITY): Payer: Self-pay

## 2023-07-25 ENCOUNTER — Other Ambulatory Visit: Payer: Self-pay | Admitting: Urology

## 2023-07-25 ENCOUNTER — Other Ambulatory Visit (HOSPITAL_COMMUNITY): Payer: Self-pay

## 2023-07-25 MED ORDER — OXYCODONE-ACETAMINOPHEN 5-325 MG PO TABS
1.0000 | ORAL_TABLET | Freq: Every evening | ORAL | 0 refills | Status: DC | PRN
Start: 2023-07-25 — End: 2023-09-08
  Filled 2023-07-25: qty 60, 42d supply, fill #0

## 2023-07-26 ENCOUNTER — Other Ambulatory Visit (HOSPITAL_COMMUNITY): Payer: Self-pay

## 2023-07-29 ENCOUNTER — Ambulatory Visit: Payer: Medicare HMO | Admitting: Neurology

## 2023-08-01 ENCOUNTER — Other Ambulatory Visit (HOSPITAL_COMMUNITY): Payer: Self-pay

## 2023-08-09 ENCOUNTER — Encounter: Admitting: Student

## 2023-08-17 ENCOUNTER — Encounter: Admitting: Student

## 2023-08-22 ENCOUNTER — Encounter: Payer: Self-pay | Admitting: Cardiology

## 2023-08-22 ENCOUNTER — Ambulatory Visit: Admitting: Student

## 2023-08-22 ENCOUNTER — Other Ambulatory Visit: Payer: Self-pay

## 2023-08-22 ENCOUNTER — Encounter: Payer: Self-pay | Admitting: Internal Medicine

## 2023-08-22 ENCOUNTER — Ambulatory Visit: Attending: Cardiology | Admitting: Cardiology

## 2023-08-22 ENCOUNTER — Other Ambulatory Visit (HOSPITAL_COMMUNITY): Payer: Self-pay

## 2023-08-22 VITALS — BP 126/80 | HR 84 | Resp 16 | Ht 72.0 in | Wt 174.2 lb

## 2023-08-22 VITALS — BP 100/69 | HR 92 | Temp 98.8°F | Wt 174.0 lb

## 2023-08-22 DIAGNOSIS — Z7984 Long term (current) use of oral hypoglycemic drugs: Secondary | ICD-10-CM | POA: Diagnosis not present

## 2023-08-22 DIAGNOSIS — I4729 Other ventricular tachycardia: Secondary | ICD-10-CM

## 2023-08-22 DIAGNOSIS — C711 Malignant neoplasm of frontal lobe: Secondary | ICD-10-CM | POA: Diagnosis not present

## 2023-08-22 DIAGNOSIS — Z7985 Long-term (current) use of injectable non-insulin antidiabetic drugs: Secondary | ICD-10-CM | POA: Diagnosis not present

## 2023-08-22 DIAGNOSIS — E1159 Type 2 diabetes mellitus with other circulatory complications: Secondary | ICD-10-CM

## 2023-08-22 DIAGNOSIS — I252 Old myocardial infarction: Secondary | ICD-10-CM

## 2023-08-22 DIAGNOSIS — F17210 Nicotine dependence, cigarettes, uncomplicated: Secondary | ICD-10-CM | POA: Diagnosis not present

## 2023-08-22 DIAGNOSIS — I255 Ischemic cardiomyopathy: Secondary | ICD-10-CM | POA: Diagnosis not present

## 2023-08-22 DIAGNOSIS — Z1211 Encounter for screening for malignant neoplasm of colon: Secondary | ICD-10-CM

## 2023-08-22 DIAGNOSIS — Z95820 Peripheral vascular angioplasty status with implants and grafts: Secondary | ICD-10-CM | POA: Diagnosis not present

## 2023-08-22 DIAGNOSIS — E782 Mixed hyperlipidemia: Secondary | ICD-10-CM | POA: Diagnosis not present

## 2023-08-22 DIAGNOSIS — Z720 Tobacco use: Secondary | ICD-10-CM

## 2023-08-22 DIAGNOSIS — E119 Type 2 diabetes mellitus without complications: Secondary | ICD-10-CM | POA: Diagnosis not present

## 2023-08-22 DIAGNOSIS — I2510 Atherosclerotic heart disease of native coronary artery without angina pectoris: Secondary | ICD-10-CM | POA: Diagnosis not present

## 2023-08-22 DIAGNOSIS — Z0000 Encounter for general adult medical examination without abnormal findings: Secondary | ICD-10-CM

## 2023-08-22 LAB — POCT GLYCOSYLATED HEMOGLOBIN (HGB A1C): Hemoglobin A1C: 6.9 % — AB (ref 4.0–5.6)

## 2023-08-22 LAB — GLUCOSE, CAPILLARY: Glucose-Capillary: 150 mg/dL — ABNORMAL HIGH (ref 70–99)

## 2023-08-22 MED ORDER — SEMAGLUTIDE (1 MG/DOSE) 4 MG/3ML ~~LOC~~ SOPN
1.0000 mg | PEN_INJECTOR | SUBCUTANEOUS | 3 refills | Status: DC
  Filled 2023-08-22: qty 3, 28d supply, fill #0
  Filled 2023-10-04 (×2): qty 3, 28d supply, fill #1
  Filled 2023-10-31: qty 3, 28d supply, fill #2
  Filled 2023-11-25: qty 3, 28d supply, fill #3

## 2023-08-22 MED ORDER — SPIRONOLACTONE 25 MG PO TABS
25.0000 mg | ORAL_TABLET | Freq: Every day | ORAL | 3 refills | Status: DC
  Filled 2023-08-22: qty 30, 30d supply, fill #0

## 2023-08-22 NOTE — Progress Notes (Unsigned)
 Subjective:  CC: diabetes  HPI:  [name] is a 58 y.o. male with a past medical history of diabetes, HLD, HFrEF with ischemic cardiomyopathy, astrocytoma of frontal lobe and epilepsy who presents today for follow-up on diabetes. GLP-1 was increased from 0.5 mg weekly to 1 mg weekly.  Please see problem based assessment and plan for additional details.  Past Medical History:  Diagnosis Date   ACS (acute coronary syndrome) (HCC)    Astrocytoma of frontal lobe (HCC) 05/31/2013   Claudication (HCC) 09/24/2020   Coronary artery disease    DM (diabetes mellitus) (HCC) 08/09/2011   A1c at diagnosis = 12.4   Family history of breast cancer    GERD (gastroesophageal reflux disease) 08/09/2011   H/O heart artery stent 07/14/2018   Hyperlipidemia    LAD stenosis    Non-ST elevation (NSTEMI) myocardial infarction (HCC) 07/15/2019   Nonsustained ventricular tachycardia (HCC)    NSVT (nonsustained ventricular tachycardia) (HCC)    Screening for colon cancer 09/24/2020   Seizure (HCC) 05/19/2013   Shortness of breath     MEDICATIONS:  Asa 81 mg Atorvastatin  80 mg Jardiance  10 mg Gabapentin  600 mg Metoprolol  tartrate 50 mg Percocet Phenytoid 200 mg ER Entresto  24-26 mg BID Semaglutide  1 mg weekly Spironolactone  25 mg   Family History  Problem Relation Age of Onset   Breast cancer Mother        dx in her 41s-60s   Bone cancer Mother    Diabetes Father    Heart disease Father    Diabetes Brother    Diabetes Paternal Uncle     Past Surgical History:  Procedure Laterality Date   APPENDECTOMY     BRAIN BIOPSY N/A 05/24/2013   Procedure: Craniotomy for open biopsy;  Surgeon: Pasty Bongo, MD;  Location: [facility];  Service: Neurosurgery;  Laterality: N/A;  Craniotomy for open biopsy   CARDIAC CATHETERIZATION     CORONARY ANGIOPLASTY WITH STENT PLACEMENT     CORONARY/GRAFT ACUTE MI REVASCULARIZATION N/A 07/15/2019   Procedure: Coronary/Graft Acute MI  Revascularization;  Surgeon: Knox Perl, MD;  Location: [facility] INVASIVE CV LAB;  Service: Cardiovascular;  Laterality: N/A;   LEFT HEART CATH AND CORONARY ANGIOGRAPHY N/A 07/15/2019   Procedure: LEFT HEART CATH AND CORONARY ANGIOGRAPHY;  Surgeon: Knox Perl, MD;  Location: [facility] INVASIVE CV LAB;  Service: Cardiovascular;  Laterality: N/A;     Social History   Socioeconomic History   Marital status: Single    Spouse name: Not on file   Number of children: 2   Years of education: 11   Highest education level: Not on file  Occupational History    Employer: [employer]  Tobacco Use   Smoking status: Every Day    Current packs/day: 1.00    Average packs/day: 1 pack/day for 33.0 years (33.0 ttl pk-yrs)    Types: Cigarettes   Smokeless tobacco: Never   Tobacco comments:    Less than 1 pack per day.  Vaping Use   Vaping status: Never Used  Substance and Sexual Activity   Alcohol use: Not Currently   Drug use: Not Currently    Types: Marijuana    Comment: Marijuana daily   Sexual activity: Not on file  Other Topics Concern   Not on file  Social History Narrative   [place]. He does not work now.    Right handed      Highest level of edu- 11th grade  Lives in one story home. Steps to enter home   Social Drivers of Health   Financial Resource Strain: Low Risk  (03/14/2023)   Overall Financial Resource Strain (CARDIA)    Difficulty of Paying Living Expenses: Not hard at all  Food Insecurity: Food Insecurity Present (03/14/2023)   Hunger Vital Sign    Worried About Running Out of Food in the Last Year: Sometimes true    Ran Out of Food in the Last Year: Sometimes true  Transportation Needs: No Transportation Needs (03/14/2023)   PRAPARE - Administrator, Civil Service (Medical): No    Lack of Transportation (Non-Medical): No  Physical Activity: Sufficiently Active (01/20/2023)   Exercise Vital Sign    Days of Exercise per Week: 7  days    Minutes of Exercise per Session: 120 min  Stress: No Stress Concern Present (01/20/2023)   [employer] of Occupational Health - Occupational Stress Questionnaire    Feeling of Stress : Not at all  Social Connections: Socially Isolated (01/20/2023)   Social Connection and Isolation Panel [NHANES]    Frequency of Communication with Friends and Family: More than three times a week    Frequency of Social Gatherings with Friends and Family: More than three times a week    Attends Religious Services: Never    Database administrator or Organizations: No    Attends Banker Meetings: Never    Marital Status: Never married  Intimate Partner Violence: Not At Risk (03/14/2023)   Humiliation, Afraid, Rape, and Kick questionnaire    Fear of Current or Ex-Partner: No    Emotionally Abused: No    Physically Abused: No    Sexually Abused: No    Review of Systems: ROS negative except for what is noted on the assessment and plan.  Objective:   Vitals:   08/22/23 1436 08/22/23 1441  BP: 95/71 100/69  Pulse: 94 92  Temp: 98.8 F (37.1 C)   TempSrc: Oral   SpO2: 100%   Weight: 174 lb (78.9 kg)     Physical Exam: Constitutional: well-appearing, in no acute distress Cardiovascular: regular rate and rhythm, no m/r/g Pulmonary/Chest: normal work of breathing on room air, lungs clear to auscultation bilaterally Abdominal: soft, non-tender, non-distended MSK: normal bulk and tone Skin: warm and dry  Assessment & Plan:  Type II diabetes mellitus (HCC)  Lab Results  Component Value Date   HGBA1C 6.9 (A) 08/22/2023   A1c improved from 7.4 to 6.9%. Medications include ozempic  1 mg wkly and jardiance  10 mg every day. He has lost about 10 lbs with increasing ozempic  and increasing exercise. P: Continue ozempic  1 mg weekly and jardiance  10 mg He declined urine microalbumin as he doesn't need to pee at this time. I encouraged him to wait to pee until he gets into clinic  next time.  Tobacco use He continues to work on decreasing smoking. He met with cardiology who also encouraged him to do so. He is not interested in chantix  or NRTs.  Screening for colon cancer He states that he has FIT test at home that he has not sent in. He plans to so soon. He is not interested in colonoscopy.  Healthcare maintenance He declined pneumovax, flu, and tetanus shot at this visit.   Patient discussed with Dr. Bernell Brigham Bronnie Vasseur, D.O. [facility]  PGY-3 Pager: [phone]  Phone: [phone] Date 08/23/2023  Time 1:41 PM

## 2023-08-22 NOTE — Patient Instructions (Signed)
 Thank you, [name] for allowing us  to provide your care today.   Diabetes Your diabetes blood work was at goal today to help decrease risk of complication from diabetes. Please continue your medications, I sent in a refill of ozempic . The urine test is to look for protein, I will call with those results.  I have ordered the following labs for you:   Lab Orders         Glucose, capillary         Microalbumin / Creatinine Urine Ratio         POC Hbg A1C      I have ordered the following medication/changed the following medications:   Stop the following medications: Medications Discontinued During This Encounter  Medication Reason   Semaglutide , 1 MG/DOSE, 4 MG/3ML SOPN Reorder     Start the following medications: Meds ordered this encounter  Medications   Semaglutide , 1 MG/DOSE, 4 MG/3ML SOPN    Sig: Inject 1 mg into the skin once a week.    Dispense:  3 mL    Refill:  3     Follow up: 3 months   We look forward to seeing you next time. Please call our clinic at [phone] if you have any questions or concerns. The best time to call is Monday-Friday from 9am-4pm, but there is someone available 24/7. If after hours or the weekend, call the main hospital number and ask for the Internal Medicine Resident On-Call. If you need medication refills, please notify your pharmacy one week in advance and they will send us  a request.   Thank you for trusting me with your care. Wishing you the best!   Karalee Oscar, DO [facility]

## 2023-08-22 NOTE — Progress Notes (Signed)
 Cardiology Office Note:  .   ID:  Jonathan Barker, DOB Sep 16, 1965, MRN [id number] PCP:  Lasandra Points, MD  Former Cardiology Providers: Dr. Knox Perl  [facility] Providers Cardiologist:  Olinda Bertrand, DO , [facility] (established care March 2021) Electrophysiologist:  None  Click to update primary MD,subspecialty MD or APP then REFRESH:1}    Chief Complaint  Patient presents with   Coronary artery disease involving native coronary artery of   Results   Follow-up    History of Present Illness: .   Jonathan Barker is a 58 y.o. Caucasian male whose past medical history and cardiovascular risk factors includes: Hx of non-STEMI, established coronary artery disease with PCI, ischemic cardiomyopathy, hypertension, hyperlipidemia, Aortic atherosclerosis, non- insulin  diabetes mellitus type 2, history of nonsustained ventricular tachycardia, active tobacco use, history of astrocytoma.   Patient is accompanied by his daughter at today's office visit.  In March 2021 patient presented to the hospital with chest pain and ruled in for NSTEMI.  He underwent angiography and was noted to have obstructive disease and successfully underwent intervention to the LAD as noted below.  In the recent past patient's LVEF was reduced but has not been experiencing anginal chest pain or heart failure symptoms.  Cardiac PET/CT in March 2025 which reported no evidence of ischemia but findings consistent with prior infarction.  Patient is currently on Jardiance , Lopressor , Entresto , Ozempic , aspirin  and statin therapy.  Since last office visit patient denies any anginal chest pain or heart failure symptoms.  Unfortunately, continues to smoke 0.5 packs/day.  He continues to play golf without any change in overall physical endurance.  Review of Systems: .   Review of Systems  Cardiovascular:  Negative for chest pain, claudication, irregular heartbeat, leg swelling, near-syncope, orthopnea, palpitations,  paroxysmal nocturnal dyspnea and syncope.  Respiratory:  Negative for shortness of breath.   Hematologic/Lymphatic: Negative for bleeding problem.   Studies Reviewed:    Echocardiogram: 07/15/2019: LVEF 30-35%, see report for additional details  08/26/2020:  Left ventricle cavity is normal in size. Mild concentric hypertrophy of the left ventricle. Mid to distal anteroseptal and apical severe hypokinesis. LVEF 40-45%. Grade 1 diastolic dysfunction, normal LAP.  Mild (Grade I) mitral regurgitation.  No evidence of pulmonary hypertension.   January 2025 LVEF: 25-30%, global hypokinesis Diastolic Function: Grade 1 Right ventricular function and size normal Aorta: Aortic root 40 mm Estimated RAP 3 mmHg   Left Heart Catheterization 07/15/19:  LV: Mid to distal anterior, anterolateral and apical akinesis, EF 35 to at most 40%.  Hand contrast injection, not well visualized.  Normal LVEDP. RCA: Dominant, mild diffuse disease. Left main: Mildly calcified. LAD: Diffusely diseased proximal segment, mid segment had a 90% stenosis.  Mid to distal segment has minimal disease and apical LAD had 99% stenosis.  Successful PTCA and stenting of the proximal/ostial and mid LAD with implantation of a long 3.0 x 38 mm resolute Onyx DES postdilated with 3.5 x 12 mm noncompliant sapphire balloon.  90% stenosis reduced to 0% with TIMI-3 to TIMI-3 flow.  Apical LAD stenosis left alone. Ramus intermediate: Large vessel, ostium has ulcerated appearing 99% stenosis.  Upon angioplasty realized it was also calcified and probably chronic.  Successful PTCA and stenting with 2 overlapping 2.5 x 15 and distally 2.5 x 13 mm resolute Onyx DES.  9 9% stenosis reduced to 0% with TIMI-3 to TIMI-3 flow. Circumflex: Moderate to large vessel, proximal to mid segment is diffusely diseased 30%.  Large OM1 with proximal  long 60% to at most 70% stenosis.  Cardiac PET/CT 07/20/2023   Findings are consistent with infarction. The  study is high risk due to infarct burden.   LV perfusion is abnormal. There is no evidence of ischemia. There is evidence of infarction. Defect 1: There is a large defect with absent uptake present in the apical to basal anterior location(s) that is fixed. There is abnormal wall motion in the defect area. Consistent with infarction.   Rest left ventricular function is abnormal. Rest global function is moderately reduced. Rest EF: 30%. Stress left ventricular function is abnormal. Stress global function is mildly reduced. Stress EF: 40%. End diastolic cavity size is moderately enlarged. End systolic cavity size is mildly enlarged.   Myocardial blood flow was computed to be 0.2ml/g/min at rest and 2.6ml/g/min at stress. Global myocardial blood flow reserve was 2.55 and was normal.   Coronary calcium  assessment not performed due to prior revascularization. Aortic Atherosclerosis. Radiology: 1. Emphysema and mild diffuse bilateral bronchial wall thickening. 2. Coronary artery disease.   Aortic Atherosclerosis (ICD10-I70.0).  RADIOLOGY: NA  Risk Assessment/Calculations:   N/A   Labs:       Latest Ref Rng & Units 07/16/2019    2:28 AM 07/15/2019    3:10 AM 07/14/2019    9:54 PM  CBC  WBC 4.0 - 10.5 K/uL 21.8  20.2  17.0   Hemoglobin 13.0 - 17.0 g/dL 16.1  09.6  04.5   Hematocrit 39.0 - 52.0 % 42.4  49.7  54.0   Platelets 150 - 400 K/uL 299  330  345        Latest Ref Rng & Units 11/10/2022   10:13 AM 01/20/2022   10:25 AM 03/30/2021   10:16 AM  BMP  Glucose 70 - 99 mg/dL 83  409  811   BUN 6 - 24 mg/dL 18  9  17    Creatinine 0.76 - 1.27 mg/dL 9.14  7.82  9.56   BUN/Creat Ratio 9 - 20 20  12  21    Sodium 134 - 144 mmol/L 143  139  136   Potassium 3.5 - 5.2 mmol/L 4.2  3.9  4.3   Chloride 96 - 106 mmol/L 102  104  100   CO2 20 - 29 mmol/L 21  22  22    Calcium  8.7 - 10.2 mg/dL 9.9  9.5  9.3       Latest Ref Rng & Units 11/10/2022   10:13 AM 01/20/2022   10:25 AM 03/30/2021    10:16 AM  CMP  Glucose 70 - 99 mg/dL 83  213  086   BUN 6 - 24 mg/dL 18  9  17    Creatinine 0.76 - 1.27 mg/dL 5.78  4.69  6.29   Sodium 134 - 144 mmol/L 143  139  136   Potassium 3.5 - 5.2 mmol/L 4.2  3.9  4.3   Chloride 96 - 106 mmol/L 102  104  100   CO2 20 - 29 mmol/L 21  22  22    Calcium  8.7 - 10.2 mg/dL 9.9  9.5  9.3   Total Protein 6.0 - 8.5 g/dL 7.3     Total Bilirubin 0.0 - 1.2 mg/dL 0.2     Alkaline Phos 44 - 121 IU/L 104     AST 0 - 40 IU/L 25     ALT 0 - 44 IU/L 37       Lab Results  Component Value Date   CHOL 104 11/10/2022  HDL 35 (L) 11/10/2022   LDLCALC 51 11/10/2022   TRIG 91 11/10/2022   CHOLHDL 3.0 11/10/2022   No results for input(s): "LIPOA" in the last 8760 hours. No components found for: "NTPROBNP" No results for input(s): "PROBNP" in the last 8760 hours. No results for input(s): "TSH" in the last 8760 hours.    Physical Exam:    Today's Vitals   08/22/23 0820  BP: 126/80  Pulse: 84  Resp: 16  SpO2: 98%  Weight: 174 lb 3.2 oz (79 kg)  Height: 6' (1.829 m)   Body mass index is 23.63 kg/m. Wt Readings from Last 3 Encounters:  08/22/23 174 lb (78.9 kg)  08/22/23 174 lb 3.2 oz (79 kg)  05/31/23 183 lb 8 oz (83.2 kg)    Physical Exam  Constitutional: No distress.  Age appropriate, hemodynamically stable.   Neck: No JVD present.  Cardiovascular: Normal rate, regular rhythm, S1 normal, S2 normal, intact distal pulses and normal pulses. Exam reveals no gallop, no S3 and no S4.  Murmur heard. Holosystolic murmur is present with a grade of 3/6 at the apex. Pulmonary/Chest: No stridor. He has no wheezes. He has no rales.  Decreased breath sounds bilaterally, poor inspiratory effort  Abdominal: Soft. Bowel sounds are normal. He exhibits no distension. There is no abdominal tenderness.  Musculoskeletal:        General: No edema.     Cervical back: Neck supple.  Neurological: He is alert and oriented to person, place, and time. He has intact  cranial nerves (2-12).  Skin: Skin is warm and moist.     Impression & Recommendation(s):  Impression:   ICD-10-CM   1. Coronary artery disease involving native coronary artery of native heart without angina pectoris  I25.10     2. Hx of non-ST elevation myocardial infarction (NSTEMI)  I25.2     3. Ischemic cardiomyopathy  I25.5 Basic metabolic panel with GFR    spironolactone  (ALDACTONE ) 25 MG tablet    Basic metabolic panel with GFR    4. S/P angioplasty with stent  Z95.820     5. Nonsustained ventricular tachycardia (HCC)  I47.29     6. Mixed hyperlipidemia  E78.2     7. Continuous dependence on cigarette smoking  F17.210     8. Type 2 diabetes mellitus with other circulatory complication, without long-term current use of insulin  (HCC)  E11.59     9. Astrocytoma of frontal lobe (HCC)  C71.1         Recommendation(s):  Coronary artery disease involving native coronary artery of native heart without angina pectoris Hx of non-ST elevation myocardial infarction (NSTEMI) Ischemic cardiomyopathy S/P angioplasty with stent Denies anginal chest pain or heart failure symptoms. Echocardiogram notes severely reduced LVEF as of January 2025. Uptitrate GDMT in a stepwise fashion. Will start spironolactone  25 mg p.o. daily. BMP in one week to check renal function and electrolytes Continue Lopressor  50 mg p.o. twice daily, patient prefers to be on Lopressor  as opposed to Toprol -XL. Continue Entresto  24/26 mg p.o. twice daily. Continue Jardiance  10 mg p.o. daily I will have him follow-up with APP in 2 weeks to see if further medication titration could be warranted. Once he is optimized on maximally tolerated GDMT would like to repeat an echocardiogram 30 days out to reevaluate LVEF. If LVEF is severely reduced we will refer him to EP to discuss candidacy for AICD implant if patient and family agreeable. If LVEF improves device therapy would not be indicated.  Nonsustained  ventricular tachycardia (HCC) Chronic and stable, history of Currently asymptomatic. Continue beta-blocker therapy.  Mixed hyperlipidemia Currently on Lipitor  80 mg p.o. daily.   He denies myalgia or other side effects. Most recent lipids dated July 2024, independently reviewed as noted above.  Cigarette smoking Tobacco cessation counseling: Currently smoking 0.5 packs/day, was 1ppd Patient denies claudication Patient is informed to follow-up with PCP and consider lung cancer screening if and when appropriate. Consider screening for peripheral artery disease with ankle-brachial index-defer to PCP.  He is informed of the dangers of tobacco abuse including stroke, cancer, and MI, as well as benefits of tobacco cessation. He is willing to quit at this time. 5 mins were spent counseling patient cessation techniques. We discussed various methods to help quit smoking, including deciding on a date to quit, joining a support group, pharmacological agents- nicotine  gum/patch/lozenges.  I will reassess his progress at the next follow-up visit  Type 2 diabetes mellitus with other circulatory complication, without long-term current use of insulin  (HCC) Hemoglobin A1c 6.8 as of July 2024. Currently on ARNI, statin therapy, Ozempic .  Orders Placed:  Orders Placed This Encounter  Procedures   Basic metabolic panel with GFR    Standing Status:   Future    Number of Occurrences:   1    Expected Date:   08/29/2023    Expiration Date:   08/21/2024    Final Medication List:    Meds ordered this encounter  Medications   spironolactone  (ALDACTONE ) 25 MG tablet    Sig: Take 1 tablet (25 mg total) by mouth daily.    Dispense:  30 tablet    Refill:  3    There are no discontinued medications.    Current Outpatient Medications:    aspirin  81 MG chewable tablet, Chew 1 tablet (81 mg total) by mouth daily., Disp: 90 tablet, Rfl: 3   atorvastatin  (LIPITOR ) 80 MG tablet, TAKE 1 TABLET BY MOUTH DAILY  AT 6 PM., Disp: 90 tablet, Rfl: 3   blood glucose meter kit and supplies KIT, Dispense based on patient and insurance preference. Use up to four times daily as directed. (FOR ICD-9 250.00, 250.01)., Disp: 1 each, Rfl: 0   Blood Pressure Monitoring (BLOOD PRESSURE CUFF) MISC, 1 each by Does not apply route daily as needed., Disp: 1 each, Rfl: 0   ciclopirox (PENLAC) 8 % solution, 1 application Externally Once a day for 90 days, Disp: , Rfl:    empagliflozin  (JARDIANCE ) 10 MG TABS tablet, Take 1 tablet (10 mg total) by mouth daily before breakfast., Disp: 30 tablet, Rfl: 3   gabapentin  (NEURONTIN ) 600 MG tablet, Take 1 tablet (600 mg total) by mouth 2 (two) times daily., Disp: 180 tablet, Rfl: 1   Insulin  Pen Needle (PEN NEEDLES) 32G X 4 MM MISC, 1 pen by Does not apply route as directed., Disp: 100 each, Rfl: 11   metoprolol  tartrate (LOPRESSOR ) 50 MG tablet, Take 1 tablet (50 mg total) by mouth 2 (two) times daily.HOLD IF SYSTOLIC BP (TOP BLOOD PRESSURE NUMBER) LESS THAN 100 MMHG OR HEART RATE LESS THAN 60 BPM (PULSE)., Disp: 180 tablet, Rfl: 2   mupirocin  ointment (BACTROBAN ) 2 %, Apply topically as directed daily., Disp: 22 g, Rfl: 0   nitroGLYCERIN  (NITROSTAT ) 0.4 MG SL tablet, Place 1 tablet (0.4 mg total) under the tongue every 5 (five) minutes x 3 doses as needed for chest pain., Disp: 90 tablet, Rfl: 3   NON FORMULARY, Take 1 tablet by mouth daily. OTC Nerve  Vive, Disp: , Rfl:    oxyCODONE -acetaminophen  (PERCOCET/ROXICET) 5-325 MG tablet, Take 1 tablet by mouth at bedtime as needed for severe pain (pain score 7-10) (may repeat one additional tablet if needed for severe pain only- Rx will only be refilled every 6 weeks)., Disp: 60 tablet, Rfl: 0   phenytoin  (DILANTIN ) 100 MG ER capsule, Take 2 capsules (200 mg total) by mouth in the morning., Disp: 180 capsule, Rfl: 0   sacubitril -valsartan  (ENTRESTO ) 24-26 MG, Take 1 tablet by mouth 2 (two) times daily., Disp: 180 tablet, Rfl: 3   spironolactone   (ALDACTONE ) 25 MG tablet, Take 1 tablet (25 mg total) by mouth daily., Disp: 30 tablet, Rfl: 3   Semaglutide , 1 MG/DOSE, 4 MG/3ML SOPN, Inject 1 mg into the skin once a week., Disp: 3 mL, Rfl: 3  Consent:   NA  Disposition:   6 weeks sooner if needed  His questions and concerns were addressed to his satisfaction. He voices understanding of the recommendations provided during this encounter.    Signed, Olinda Bertrand, DO, [facility] [place]  [facility]  [address] [place], [place] [address]

## 2023-08-22 NOTE — Patient Instructions (Addendum)
 Medication Instructions:  Your physician has recommended you make the following change in your medication:   START Spironolactone  25 mg once daily in the morning   *If you need a refill on your cardiac medications before your next appointment, please call your pharmacy*  Lab Work: To be completed in 1 week: BMP  If you have labs (blood work) drawn today and your tests are completely normal, you will receive your results only by: MyChart Message (if you have MyChart) OR A paper copy in the mail If you have any lab test that is abnormal or we need to change your treatment, we will call you to review the results.  Testing/Procedures: None ordered today.  Follow-Up: At [facility], you and your health needs are our priority.  As part of our continuing mission to provide you with exceptional heart care, we have created designated Provider Care Teams.  These Care Teams include your primary Cardiologist (physician) and Advanced Practice Providers (APPs -  Physician Assistants and Nurse Practitioners) who all work together to provide you with the care you need, when you need it.  Your next appointment:   2 week(s) with APP for medication titration   6 weeks with Dr. Albert Huff  The format for your next appointment:   In Person  Provider:   APP - 2 weeks Olinda Bertrand, [place]  Other Instructions    1st Floor: - Lobby - Registration  - Pharmacy  - Lab - Cafe  2nd Floor: - PV Lab - Diagnostic Testing (echo, CT, nuclear med)  3rd Floor: - Vacant  4th Floor: - TCTS (cardiothoracic surgery) - AFib Clinic - Structural Heart Clinic - Vascular Surgery  - Vascular Ultrasound  5th Floor: - HeartCare Cardiology (general and EP) - Clinical Pharmacy for coumadin, hypertension, lipid, weight-loss medications, and med management appointments    Valet parking services will be available as well.

## 2023-08-23 NOTE — Assessment & Plan Note (Signed)
 He continues to work on decreasing smoking. He met with cardiology who also encouraged him to do so. He is not interested in chantix  or NRTs.

## 2023-08-23 NOTE — Assessment & Plan Note (Signed)
 He states that he has FIT test at home that he has not sent in. He plans to so soon. He is not interested in colonoscopy.

## 2023-08-23 NOTE — Assessment & Plan Note (Signed)
 He declined pneumovax, flu, and tetanus shot at this visit.

## 2023-08-23 NOTE — Assessment & Plan Note (Signed)
  Lab Results  Component Value Date   HGBA1C 6.9 (A) 08/22/2023   A1c improved from 7.4 to 6.9%. Medications include ozempic  1 mg wkly and jardiance  10 mg every day. He has lost about 10 lbs with increasing ozempic  and increasing exercise. P: Continue ozempic  1 mg weekly and jardiance  10 mg He declined urine microalbumin as he doesn't need to pee at this time. I encouraged him to wait to pee until he gets into clinic next time.

## 2023-08-29 NOTE — Progress Notes (Signed)
 Internal Medicine Clinic Attending  Case discussed with the resident at the time of the visit.  We reviewed the resident's history and exam and pertinent patient test results.  I agree with the assessment, diagnosis, and plan of care documented in the resident's note.

## 2023-08-31 DIAGNOSIS — I255 Ischemic cardiomyopathy: Secondary | ICD-10-CM | POA: Diagnosis not present

## 2023-09-01 LAB — BASIC METABOLIC PANEL WITH GFR
BUN/Creatinine Ratio: 19 (ref 9–20)
BUN: 17 mg/dL (ref 6–24)
CO2: 22 mmol/L (ref 20–29)
Calcium: 9.4 mg/dL (ref 8.7–10.2)
Chloride: 103 mmol/L (ref 96–106)
Creatinine, Ser: 0.88 mg/dL (ref 0.76–1.27)
Glucose: 160 mg/dL — ABNORMAL HIGH (ref 70–99)
Potassium: 4.2 mmol/L (ref 3.5–5.2)
Sodium: 139 mmol/L (ref 134–144)
eGFR: 101 mL/min/{1.73_m2} (ref >59)

## 2023-09-02 ENCOUNTER — Other Ambulatory Visit (HOSPITAL_COMMUNITY): Payer: Self-pay

## 2023-09-05 ENCOUNTER — Other Ambulatory Visit (HOSPITAL_COMMUNITY): Payer: Self-pay

## 2023-09-06 NOTE — Progress Notes (Unsigned)
 Cardiology Clinic Note   Patient Name: Jonathan Barker Date of Encounter: 09/08/2023  Primary Care Provider:  Lasandra Points, MD Primary Cardiologist:  Olinda Bertrand, DO  Patient Profile    Jonathan Barker 58 year old male presents to the clinic today for follow-up evaluation of his coronary artery disease and chronic HFrEF.  Past Medical History    Past Medical History:  Diagnosis Date   ACS (acute coronary syndrome) (HCC)    Astrocytoma of frontal lobe (HCC) 05/31/2013   Claudication (HCC) 09/24/2020   Coronary artery disease    DM (diabetes mellitus) (HCC) 08/09/2011   A1c at diagnosis = 12.4   Family history of breast cancer    GERD (gastroesophageal reflux disease) 08/09/2011   H/O heart artery stent 07/14/2018   Hyperlipidemia    LAD stenosis    Non-ST elevation (NSTEMI) myocardial infarction (HCC) 07/15/2019   Nonsustained ventricular tachycardia (HCC)    NSVT (nonsustained ventricular tachycardia) (HCC)    Screening for colon cancer 09/24/2020   Seizure (HCC) 05/19/2013   Shortness of breath    Past Surgical History:  Procedure Laterality Date   APPENDECTOMY     BRAIN BIOPSY N/A 05/24/2013   Procedure: Craniotomy for open biopsy;  Surgeon: Pasty Bongo, MD;  Location: [facility];  Service: Neurosurgery;  Laterality: N/A;  Craniotomy for open biopsy   CARDIAC CATHETERIZATION     CORONARY ANGIOPLASTY WITH STENT PLACEMENT     CORONARY/GRAFT ACUTE MI REVASCULARIZATION N/A 07/15/2019   Procedure: Coronary/Graft Acute MI Revascularization;  Surgeon: Knox Perl, MD;  Location: [facility] INVASIVE CV LAB;  Service: Cardiovascular;  Laterality: N/A;   LEFT HEART CATH AND CORONARY ANGIOGRAPHY N/A 07/15/2019   Procedure: LEFT HEART CATH AND CORONARY ANGIOGRAPHY;  Surgeon: Knox Perl, MD;  Location: [facility] INVASIVE CV LAB;  Service: Cardiovascular;  Laterality: N/A;    Allergies  No Known Allergies  History of Present Illness    Jonathan Barker has a PMH of NSTEMI,  ischemic cardiomyopathy, HTN, HLD, aortic atherosclerosis, type 2 diabetes non-insulin -dependent, NSVT, tobacco use, and astrocytoma.  He was seen in follow-up by Dr. Albert Huff on 08/22/2023.  Patient was accompanied by his daughter.  He had presented to the hospital 3/21 with chest pain and his were ruled in for NSTEMI.  He underwent cardiac catheterization and was noted to have obstructive disease.  He had PCI to his LAD.  He was noted to have recent reduced LVEF.  He denied anginal type chest pain and heart failure symptoms.  Cardiac PET/CT 3/25 showed no evidence of ischemia but did show evidence of prior infarct.  He was maintained on Jardiance , metoprolol , Entresto , Ozempic , aspirin , and statin therapy.  Unfortunately he continued to smoke half pack per day.  He continued to play golf without any change in his overall physical endurance.  EF noted to be 40% cardiac PET/CT 07/20/2023.  Follow-up was planned to see if he could tolerate further GDMT up titration.  He was started on spironolactone  25 mg daily.  Lab work 08/31/2023 showed stable creatinine and electrolytes.  He presents to the clinic today for follow-up evaluation and states he has been taking his spironolactone  as prescribed.  He initially noted increased urination with this but now feels that it is manageable.  He would like a refill of this.  He continues to play golf regularly.  He played yesterday and plans to play after the survivor walk today.  He routinely plays about 3 times per week.  He played  with his brother until his brother was not able to play anymore.  We reviewed his echocardiogram and reduced pumping function.  He expressed understanding.  He denies increased work of breathing and decreased endurance.  His blood pressure today is 112/74.  I will continue his current medication regimen, refill his spironolactone , plan for repeat echocardiogram in 1 month and plan follow-up as scheduled.  Today he denies chest pain, shortness of  breath, lower extremity edema, fatigue, palpitations, melena, hematuria, hemoptysis, diaphoresis, weakness, presyncope, syncope, orthopnea, and PND.   Home Medications    Prior to Admission medications   Medication Sig Start Date End Date Taking? Authorizing Provider  aspirin  81 MG chewable tablet Chew 1 tablet (81 mg total) by mouth daily. 07/17/19   Tolia, Sunit, DO  atorvastatin  (LIPITOR ) 80 MG tablet TAKE 1 TABLET BY MOUTH DAILY AT 6 PM. 10/18/22 10/18/23  Lasandra Points, MD  blood glucose meter kit and supplies KIT Dispense based on patient and insurance preference. Use up to four times daily as directed. (FOR ICD-9 250.00, 250.01). 07/16/19   Tolia, Sunit, DO  Blood Pressure Monitoring (BLOOD PRESSURE CUFF) MISC 1 each by Does not apply route daily as needed. 07/25/19   Melvin, Alexander B, MD  ciclopirox (PENLAC) 8 % solution 1 application Externally Once a day for 90 days    [provider]  empagliflozin  (JARDIANCE ) 10 MG TABS tablet Take 1 tablet (10 mg total) by mouth daily before breakfast. 05/31/23   Masters, Alston Jerry, DO  gabapentin  (NEURONTIN ) 600 MG tablet Take 1 tablet (600 mg total) by mouth 2 (two) times daily. 06/08/23   Guilloud, Carolyn, MD  Insulin  Pen Needle (PEN NEEDLES) 32G X 4 MM MISC 1 pen by Does not apply route as directed. 07/16/19   Knox Perl, MD  metoprolol  tartrate (LOPRESSOR ) 50 MG tablet Take 1 tablet (50 mg total) by mouth 2 (two) times daily.HOLD IF SYSTOLIC BP (TOP BLOOD PRESSURE NUMBER) LESS THAN 100 MMHG OR HEART RATE LESS THAN 60 BPM (PULSE). 03/24/22   Tolia, Sunit, DO  mupirocin  ointment (BACTROBAN ) 2 % Apply topically as directed daily. 02/04/23   Standiford, Karlene Overcast, DPM  nitroGLYCERIN  (NITROSTAT ) 0.4 MG SL tablet Place 1 tablet (0.4 mg total) under the tongue every 5 (five) minutes x 3 doses as needed for chest pain. 07/16/19   Tolia, Sunit, DO  NON FORMULARY Take 1 tablet by mouth daily. OTC Nerve Vive    [provider]   oxyCODONE -acetaminophen  (PERCOCET/ROXICET) 5-325 MG tablet Take 1 tablet by mouth at bedtime as needed for severe pain (pain score 7-10) (may repeat one additional tablet if needed for severe pain only- Rx will only be refilled every 6 weeks). 07/25/23   Bruning, Ashlyn, PA-C  phenytoin  (DILANTIN ) 100 MG ER capsule Take 2 capsules (200 mg total) by mouth in the morning. 06/17/23   Jhonny Moss, MD  sacubitril -valsartan  (ENTRESTO ) 24-26 MG Take 1 tablet by mouth 2 (two) times daily. 05/31/23   Tolia, Sunit, DO  Semaglutide , 1 MG/DOSE, 4 MG/3ML SOPN Inject 1 mg into the skin once a week. 08/22/23   Masters, Katie, DO  spironolactone  (ALDACTONE ) 25 MG tablet Take 1 tablet (25 mg total) by mouth daily. 08/22/23   Olinda Bertrand, DO    Family History    Family History  Problem Relation Age of Onset   Breast cancer Mother        dx in her 50s-60s   Bone cancer Mother    Diabetes Father  Heart disease Father    Diabetes Brother    Diabetes Paternal Uncle    He indicated that his mother is deceased. He indicated that his father is deceased. He indicated that his sister is alive. He indicated that only one of his two brothers is alive. He indicated that his maternal grandmother is deceased. He indicated that his maternal grandfather is deceased. He indicated that his paternal grandmother is deceased. He indicated that his paternal grandfather is deceased. He indicated that his daughter is alive. He indicated that his son is alive. He indicated that his maternal aunt is deceased. He indicated that his maternal uncle is deceased. He indicated that the status of his paternal uncle is unknown.  Social History    Social History   Socioeconomic History   Marital status: Single    Spouse name: Not on file   Number of children: 2   Years of education: 11   Highest education level: Not on file  Occupational History    Employer: [employer]  Tobacco Use   Smoking status: Every Day     Current packs/day: 1.00    Average packs/day: 1 pack/day for 33.0 years (33.0 ttl pk-yrs)    Types: Cigarettes   Smokeless tobacco: Never   Tobacco comments:    Less than 1 pack per day.  Vaping Use   Vaping status: Never Used  Substance and Sexual Activity   Alcohol use: Not Currently   Drug use: Not Currently    Types: Marijuana    Comment: Marijuana daily   Sexual activity: Not on file  Other Topics Concern   Not on file  Social History Narrative   [place]. He does not work now.    Right handed      Highest level of edu- 11th grade      Lives in one story home. Steps to enter home   Social Drivers of Health   Financial Resource Strain: Low Risk  (03/14/2023)   Overall Financial Resource Strain (CARDIA)    Difficulty of Paying Living Expenses: Not hard at all  Food Insecurity: Food Insecurity Present (03/14/2023)   Hunger Vital Sign    Worried About Running Out of Food in the Last Year: Sometimes true    Ran Out of Food in the Last Year: Sometimes true  Transportation Needs: No Transportation Needs (03/14/2023)   PRAPARE - Administrator, Civil Service (Medical): No    Lack of Transportation (Non-Medical): No  Physical Activity: Sufficiently Active (01/20/2023)   Exercise Vital Sign    Days of Exercise per Week: 7 days    Minutes of Exercise per Session: 120 min  Stress: No Stress Concern Present (01/20/2023)   [employer] of Occupational Health - Occupational Stress Questionnaire    Feeling of Stress : Not at all  Social Connections: Socially Isolated (01/20/2023)   Social Connection and Isolation Panel [NHANES]    Frequency of Communication with Friends and Family: More than three times a week    Frequency of Social Gatherings with Friends and Family: More than three times a week    Attends Religious Services: Never    Database administrator or Organizations: No    Attends Banker Meetings: Never    Marital  Status: Never married  Intimate Partner Violence: Not At Risk (03/14/2023)   Humiliation, Afraid, Rape, and Kick questionnaire    Fear of Current or Ex-Partner: No    Emotionally  Abused: No    Physically Abused: No    Sexually Abused: No     Review of Systems    General:  No chills, fever, night sweats or weight changes.  Cardiovascular:  No chest pain, dyspnea on exertion, edema, orthopnea, palpitations, paroxysmal nocturnal dyspnea. Dermatological: No rash, lesions/masses Respiratory: No cough, dyspnea Urologic: No hematuria, dysuria Abdominal:   No nausea, vomiting, diarrhea, bright red blood per rectum, melena, or hematemesis Neurologic:  No visual changes, wkns, changes in mental status. All other systems reviewed and are otherwise negative except as noted above.  Physical Exam    VS:  BP 112/74 (BP Location: Left Arm, Patient Position: Sitting, Cuff Size: Normal)   Pulse 98   Ht 5\' 11"  (1.803 m)   Wt 172 lb (78 kg)   SpO2 95%   BMI 23.99 kg/m  , BMI Body mass index is 23.99 kg/m. GEN: Well nourished, well developed, in no acute distress. HEENT: normal. Neck: Supple, no JVD, carotid bruits, or masses. Cardiac: RRR, no murmurs, rubs, or gallops. No clubbing, cyanosis, edema.  Radials/DP/PT 2+ and equal bilaterally.  Respiratory:  Respirations regular and unlabored, clear to auscultation bilaterally. GI: Soft, nontender, nondistended, BS + x 4. MS: no deformity or atrophy. Skin: warm and dry, no rash. Neuro:  Strength and sensation are intact. Psych: Normal affect.  Accessory Clinical Findings    Recent Labs: 11/10/2022: ALT 37 08/31/2023: BUN 17; Creatinine, Ser 0.88; Potassium 4.2; Sodium 139   Recent Lipid Panel    Component Value Date/Time   CHOL 104 11/10/2022 1013   TRIG 91 11/10/2022 1013   HDL 35 (L) 11/10/2022 1013   CHOLHDL 3.0 11/10/2022 1013   CHOLHDL 5.7 07/14/2019 2336   VLDL 25 07/14/2019 2336   LDLCALC 51 11/10/2022 1013         ECG  personally reviewed by me today- none today      Findings are consistent with infarction. The study is high risk due to infarct burden.   LV perfusion is abnormal. There is no evidence of ischemia. There is evidence of infarction. Defect 1: There is a large defect with absent uptake present in the apical to basal anterior location(s) that is fixed. There is abnormal wall motion in the defect area. Consistent with infarction.   Rest left ventricular function is abnormal. Rest global function is moderately reduced. Rest EF: 30%. Stress left ventricular function is abnormal. Stress global function is mildly reduced. Stress EF: 40%. End diastolic cavity size is moderately enlarged. End systolic cavity size is mildly enlarged.   Myocardial blood flow was computed to be 0.46ml/g/min at rest and 2.7ml/g/min at stress. Global myocardial blood flow reserve was 2.55 and was normal.   Coronary calcium  assessment not performed due to prior revascularization. Aortic Atherosclerosis.   Electronically Signed  By: Gloriann Larger M.D.   CLINICAL DATA:  This over-read does not include interpretation of cardiac or coronary anatomy or pathology. The Cardiac PET CT interpretation by the cardiologist is attached.   COMPARISON:  12/03/2022   FINDINGS: Cardiovascular: Scattered aortic atherosclerosis. Normal heart size. Three-vessel coronary artery calcifications and stents. No pericardial effusion.   Limited Mediastinum/Nodes: No enlarged mediastinal, hilar, or axillary lymph nodes. Trachea and esophagus demonstrate no significant findings.   Limited Lungs/Pleura: Emphysema and mild diffuse bilateral bronchial wall thickening. No pleural effusion or pneumothorax.   Upper Abdomen: No acute abnormality.   Musculoskeletal: No chest wall abnormality. No acute osseous findings.   IMPRESSION: 1. Emphysema and mild diffuse  bilateral bronchial wall thickening. 2. Coronary artery disease.   Aortic  Atherosclerosis (ICD10-I70.0).     Electronically Signed   By: Fredricka Jenny M.D.   On: 07/20/2023 10:34      Assessment & Plan   1.  Ischemic cardiomyopathy-continues to be physically active playing golf and doing all of his normal daily activities.  Denies increased DOE.  Euvolemic.  Weight stable.  EF noted to be 40 on 3/25.  Unable to uptitrate GDMT due to blood pressure. Heart healthy low-sodium diet Maintain physical activity Continue spironolactone , metoprolol , Entresto , Jardiance  Plan for repeat echocardiogram in 1 month.  Coronary artery disease-denies recent episode of anginal type symptoms.  Underwent cardiac catheterization 3/21 and had PCI to his LAD.  Cardiac PET CT 3/25 showed no evidence of ischemia but did note prior infarct.  EF reduced to 40%. Heart healthy low-sodium diet Continue metoprolol , Lipitor , Jardiance   Hyperlipidemia-LDL51 on 11/10/22 . High-fiber diet Continue aspirin , atorvastatin   NSVT-denies recent episodes of irregular or accelerated heartbeat. Avoid triggers caffeine, chocolate, EtOH, dehydration etc. Continue metoprolol   Type 2 diabetes-A1c 6.8 on 7/24. Continue statin therapy, Ozempic  Carb modified diet Follows with PCP   Disposition: Follow-up with Dr. Albert Huff or me after echocardiogram.   Chet Cota. Grissel Tyrell NP-C     09/08/2023, 9:17 AM [facility] [address] Office [phone] Fax [phone]    I spent 14 minutes examining this patient, reviewing medications, and using patient centered shared decision making involving their cardiac care.   I spent  20 minutes reviewing past medical history,  medications, and prior cardiac tests.

## 2023-09-08 ENCOUNTER — Other Ambulatory Visit: Payer: Self-pay | Admitting: Urology

## 2023-09-08 ENCOUNTER — Ambulatory Visit: Attending: General Practice | Admitting: General Practice

## 2023-09-08 ENCOUNTER — Encounter: Payer: Self-pay | Admitting: General Practice

## 2023-09-08 ENCOUNTER — Other Ambulatory Visit (HOSPITAL_COMMUNITY): Payer: Self-pay

## 2023-09-08 VITALS — BP 112/74 | HR 98 | Ht 71.0 in | Wt 172.0 lb

## 2023-09-08 DIAGNOSIS — I2510 Atherosclerotic heart disease of native coronary artery without angina pectoris: Secondary | ICD-10-CM | POA: Diagnosis not present

## 2023-09-08 DIAGNOSIS — E1159 Type 2 diabetes mellitus with other circulatory complications: Secondary | ICD-10-CM

## 2023-09-08 DIAGNOSIS — I255 Ischemic cardiomyopathy: Secondary | ICD-10-CM | POA: Diagnosis not present

## 2023-09-08 DIAGNOSIS — Z720 Tobacco use: Secondary | ICD-10-CM

## 2023-09-08 DIAGNOSIS — E782 Mixed hyperlipidemia: Secondary | ICD-10-CM | POA: Diagnosis not present

## 2023-09-08 DIAGNOSIS — I4729 Other ventricular tachycardia: Secondary | ICD-10-CM

## 2023-09-08 MED ORDER — SPIRONOLACTONE 25 MG PO TABS
25.0000 mg | ORAL_TABLET | Freq: Every day | ORAL | 3 refills | Status: AC
  Filled 2023-09-08 – 2023-09-22 (×4): qty 90, 90d supply, fill #0
  Filled 2024-01-18: qty 90, 90d supply, fill #1
  Filled 2024-05-07 (×2): qty 90, 90d supply, fill #2

## 2023-09-08 MED ORDER — OXYCODONE-ACETAMINOPHEN 5-325 MG PO TABS
1.0000 | ORAL_TABLET | Freq: Every evening | ORAL | 0 refills | Status: DC | PRN
  Filled 2023-09-08: qty 60, 42d supply, fill #0

## 2023-09-08 NOTE — Patient Instructions (Signed)
 Medication Instructions:  Continue same medications *If you need a refill on your cardiac medications before your next appointment, please call your pharmacy*  Lab Work: None ordered  Testing/Procedures: Schedule Echo in 1 month 10/2023  Follow-Up: At [facility], you and your health needs are our priority.  As part of our continuing mission to provide you with exceptional heart care, our providers are all part of one team.  This team includes your primary Cardiologist (physician) and Advanced Practice Providers or APPs (Physician Assistants and Nurse Practitioners) who all work together to provide you with the care you need, when you need it.  Your next appointment:  Fri  6/6 at 8:40 am    Provider:  Dr.Tolia   We recommend signing up for the patient portal called "MyChart".  Sign up information is provided on this After Visit Summary.  MyChart is used to connect with patients for Virtual Visits (Telemedicine).  Patients are able to view lab/test results, encounter notes, upcoming appointments, etc.  Non-urgent messages can be sent to your provider as well.   To learn more about what you can do with MyChart, go to [email or web address].

## 2023-09-15 ENCOUNTER — Other Ambulatory Visit (HOSPITAL_COMMUNITY): Payer: Self-pay

## 2023-09-15 NOTE — Telephone Encounter (Signed)
 Called pt and no answer. Called the pt's contact (Daughter) Diona Franklin and her VM is full unable to leave a message.    Left a message with [facility] the pt's number are working  but no answer.   Copied from CRM [id number] [phone]. Topic: Medical Record Request - Provider/Facility Request >> Sep 15, 2023  1:00 PM Tisa Forester wrote: Reason for CRM: suzie calling from [facility] -requesting to confirm patient phone number having a hard time reaching patient regarding  the last refill pick up 05/31/23  the medication is atorvastatin  (LIPITOR ) 80 MG tablet please call suzie at [facility] - [phone] extenison [phone]

## 2023-09-19 ENCOUNTER — Other Ambulatory Visit (HOSPITAL_COMMUNITY): Payer: Self-pay

## 2023-09-22 ENCOUNTER — Other Ambulatory Visit (HOSPITAL_COMMUNITY): Payer: Self-pay

## 2023-09-22 ENCOUNTER — Other Ambulatory Visit: Payer: Self-pay

## 2023-10-04 ENCOUNTER — Other Ambulatory Visit: Payer: Self-pay

## 2023-10-04 ENCOUNTER — Ambulatory Visit (HOSPITAL_COMMUNITY): Attending: Internal Medicine

## 2023-10-04 ENCOUNTER — Other Ambulatory Visit (HOSPITAL_COMMUNITY): Payer: Self-pay

## 2023-10-05 ENCOUNTER — Encounter (HOSPITAL_COMMUNITY): Payer: Self-pay | Admitting: General Practice

## 2023-10-07 ENCOUNTER — Ambulatory Visit: Admitting: Cardiology

## 2023-10-13 ENCOUNTER — Other Ambulatory Visit (HOSPITAL_COMMUNITY)

## 2023-10-17 ENCOUNTER — Other Ambulatory Visit: Payer: Self-pay | Admitting: Neurology

## 2023-10-17 ENCOUNTER — Other Ambulatory Visit: Payer: Self-pay | Admitting: Urology

## 2023-10-17 ENCOUNTER — Other Ambulatory Visit (HOSPITAL_COMMUNITY): Payer: Self-pay

## 2023-10-17 MED ORDER — OXYCODONE-ACETAMINOPHEN 5-325 MG PO TABS
1.0000 | ORAL_TABLET | Freq: Every evening | ORAL | 0 refills | Status: DC | PRN
  Filled 2023-10-20: qty 60, 42d supply, fill #0
  Filled ????-??-??: fill #0

## 2023-10-18 ENCOUNTER — Other Ambulatory Visit (HOSPITAL_COMMUNITY): Payer: Self-pay

## 2023-10-18 ENCOUNTER — Other Ambulatory Visit: Payer: Self-pay | Admitting: Urology

## 2023-10-18 ENCOUNTER — Other Ambulatory Visit: Payer: Self-pay

## 2023-10-18 MED ORDER — PHENYTOIN SODIUM EXTENDED 100 MG PO CAPS
200.0000 mg | ORAL_CAPSULE | Freq: Every morning | ORAL | 0 refills | Status: DC
  Filled 2023-10-18 (×2): qty 180, 90d supply, fill #0

## 2023-10-18 NOTE — Telephone Encounter (Signed)
 I have already refilled this starting on the appropriate date.

## 2023-10-20 ENCOUNTER — Other Ambulatory Visit (HOSPITAL_COMMUNITY): Payer: Self-pay

## 2023-10-20 ENCOUNTER — Other Ambulatory Visit: Payer: Self-pay

## 2023-10-31 ENCOUNTER — Other Ambulatory Visit (HOSPITAL_COMMUNITY): Payer: Self-pay

## 2023-11-03 ENCOUNTER — Other Ambulatory Visit (HOSPITAL_COMMUNITY): Payer: Self-pay

## 2023-11-21 ENCOUNTER — Ambulatory Visit (HOSPITAL_COMMUNITY)
Admission: RE | Admit: 2023-11-21 | Discharge: 2023-11-21 | Disposition: A | Discharge status: Home / Self Care | Source: Ambulatory Visit | Attending: Cardiology | Admitting: Cardiology

## 2023-11-21 DIAGNOSIS — I255 Ischemic cardiomyopathy: Secondary | ICD-10-CM | POA: Diagnosis not present

## 2023-11-21 LAB — ECHOCARDIOGRAM COMPLETE
Area-P 1/2: 4.41 cm2
Est EF: 35
S' Lateral: 3.7 cm

## 2023-11-23 ENCOUNTER — Ambulatory Visit: Admitting: Physician Assistant

## 2023-11-23 ENCOUNTER — Ambulatory Visit: Admitting: Cardiology

## 2023-11-23 ENCOUNTER — Telehealth: Payer: Self-pay | Admitting: Physician Assistant

## 2023-11-23 NOTE — Progress Notes (Deleted)
   Cardiology Office Note    Date:  11/23/2023  ID:  Jonathan Barker, DOB Oct 22, 1965, MRN [id number] PCP:  Leontine Lapine, MD  Cardiologist:  Madonna Large, DO  Electrophysiologist:  None   Chief Complaint: ***  History of Present Illness: .    Jonathan Barker is a 58 y.o. male with visit-pertinent history of CAD with NSTEMI 07/2019 s/p PTCA/DES to LAD, PTCA/DES to RI, aortic atherosclerosis, ischemic cardiomyopathy/chronic HFrEF, HTN, HLD, tobacco use, aortic atherosclerosis, DM2, NSVT, tobacco use, and astrocytoma. He had a prior NSTEMI 07/2019 with PCI as outlined above, EF 30-35% at that time. DC summary also outlined NSVT. F/u monitor 08/2019 84->125bpm, avg 97bpm, no NSVT, rare PACs/PVCs. EF improved to 40-45% in 2021, 2022. F/u echo 05/2023 showed recurrent drop in EF 25-30% with cardiac PET 07/2023 showing prior infarct but no ischemia, EF 40%. In follow-up 08/2023 he was started on spironolactone  with plan for advancing GDMT as tolerated. He has preferred Lopressor  as opposed to Toprol . He saw Josefa Beauvais 09/08/23 at which time unable to progress GDMT due to BP.   ABIs 2022 wnl   Labwork independently reviewed: 08/2023 K 4.2, Cr 0.88  ROS: .    Please see the history of present illness. Otherwise, review of systems is positive for ***.  All other systems are reviewed and otherwise negative.  Studies Reviewed: SABRA    EKG:  EKG is ordered today, personally reviewed, demonstrating ***  CV Studies: Cardiac studies reviewed are outlined and summarized above. Otherwise please see EMR for full report.   Current Reported Medications:.    No outpatient medications have been marked as taking for the 11/23/23 encounter (Appointment) with Amea Mcphail N, PA-C.    Physical Exam:    VS:  There were no vitals taken for this visit.   Wt Readings from Last 3 Encounters:  09/08/23 172 lb (78 kg)  08/22/23 174 lb (78.9 kg)  08/22/23 174 lb 3.2 oz (79 kg)    GEN: Well nourished, well  developed in no acute distress NECK: No JVD; No carotid bruits CARDIAC: ***RRR, no murmurs, rubs, gallops RESPIRATORY:  Clear to auscultation without rales, wheezing or rhonchi  ABDOMEN: Soft, non-tender, non-distended EXTREMITIES:  No edema; No acute deformity   Asessement and Plan:.     ***     Disposition: F/u with ***  Signed, Keane Martelli N Teniqua Marron, PA-C

## 2023-11-23 NOTE — Telephone Encounter (Signed)
 Hi Jonathan Barker, This is a patient that was on Dr. Tolia's schedule for follow-up after echo this morning but did not get the message about cancellation. The patient is aware of the family emergency. He was originally rescheduled to my schedule when they could not get a hold of him. The appointment was to discuss the echo results which are not yet available in Epic. Jonathan Barker and I discussed the plan for the appointment with the patient. He expressed frustration about his echocardiogram experience the other day, using some profanity. We apologized for his experience. I offered to see him today still versus reschedule for when the echo is available. He elected the latter so that further recommendations could be discussed, denies any new/acute symptoms. Could you help make sure this echo is in process to be read and help him get rescheduled? He is also known to [place] as well. Thank you so much.

## 2023-11-25 ENCOUNTER — Other Ambulatory Visit: Payer: Self-pay | Admitting: Internal Medicine

## 2023-11-25 ENCOUNTER — Ambulatory Visit: Payer: Self-pay | Admitting: Cardiology

## 2023-11-25 ENCOUNTER — Other Ambulatory Visit: Payer: Self-pay

## 2023-11-25 ENCOUNTER — Other Ambulatory Visit (HOSPITAL_COMMUNITY): Payer: Self-pay

## 2023-11-25 DIAGNOSIS — E1159 Type 2 diabetes mellitus with other circulatory complications: Secondary | ICD-10-CM

## 2023-11-25 MED ORDER — EMPAGLIFLOZIN 10 MG PO TABS
10.0000 mg | ORAL_TABLET | Freq: Every day | ORAL | 3 refills | Status: AC
  Filled 2023-11-25: qty 30, 30d supply, fill #0
  Filled 2023-12-20: qty 30, 30d supply, fill #1
  Filled 2024-02-07: qty 30, 30d supply, fill #2
  Filled 2024-03-26 (×2): qty 30, 30d supply, fill #3

## 2023-11-25 NOTE — Telephone Encounter (Signed)
 The echo has been read and acted upon by [facility] -> please make sure he has f/u rescheduled. Thank you!!

## 2023-11-29 ENCOUNTER — Telehealth: Payer: Self-pay | Admitting: Cardiology

## 2023-11-29 NOTE — Telephone Encounter (Signed)
 Spoke to patient -- results given. Patient is in agreement to make an appointment to see provider.  RN Informed patient will be sent to scheduler to make appointment

## 2023-11-29 NOTE — Telephone Encounter (Signed)
 Pt doesn't want to make an appt for his results. He wants someone to call him with his Echo result.

## 2023-11-29 NOTE — Telephone Encounter (Signed)
 echocardiogram shows EF 35% when compared to January this is a mild improvement. Seems to have very functional status. Per Jesse's note please have him follow-up with him or Dr. Michele. I would say in the next month is reasonable.  Sheng Haley PA-C

## 2023-12-01 ENCOUNTER — Other Ambulatory Visit (HOSPITAL_COMMUNITY): Payer: Self-pay

## 2023-12-01 ENCOUNTER — Other Ambulatory Visit: Payer: Self-pay | Admitting: Urology

## 2023-12-02 ENCOUNTER — Other Ambulatory Visit (HOSPITAL_COMMUNITY): Payer: Self-pay

## 2023-12-02 ENCOUNTER — Other Ambulatory Visit: Payer: Self-pay | Admitting: Urology

## 2023-12-03 ENCOUNTER — Other Ambulatory Visit (HOSPITAL_COMMUNITY): Payer: Self-pay

## 2023-12-05 ENCOUNTER — Other Ambulatory Visit (HOSPITAL_COMMUNITY): Payer: Self-pay

## 2023-12-05 ENCOUNTER — Other Ambulatory Visit: Payer: Self-pay | Admitting: Urology

## 2023-12-06 ENCOUNTER — Other Ambulatory Visit (HOSPITAL_COMMUNITY): Payer: Self-pay

## 2023-12-06 ENCOUNTER — Telehealth: Payer: Self-pay | Admitting: *Deleted

## 2023-12-06 ENCOUNTER — Other Ambulatory Visit: Payer: Self-pay | Admitting: Internal Medicine

## 2023-12-06 DIAGNOSIS — G6289 Other specified polyneuropathies: Secondary | ICD-10-CM

## 2023-12-06 MED ORDER — OXYCODONE-ACETAMINOPHEN 5-325 MG PO TABS
1.0000 | ORAL_TABLET | Freq: Every evening | ORAL | 0 refills | Status: DC | PRN
  Filled 2023-12-06: qty 60, 42d supply, fill #0

## 2023-12-06 NOTE — Telephone Encounter (Signed)
 Called patient to inform that script is ready for pick-up @ [facility], spoke with patient and he is aware this is ready for pick-up

## 2023-12-08 ENCOUNTER — Other Ambulatory Visit (HOSPITAL_COMMUNITY): Payer: Self-pay

## 2023-12-08 MED ORDER — GABAPENTIN 600 MG PO TABS
600.0000 mg | ORAL_TABLET | Freq: Two times a day (BID) | ORAL | 1 refills | Status: AC
  Filled 2023-12-08: qty 180, 90d supply, fill #0
  Filled 2024-03-16: qty 180, 90d supply, fill #1

## 2023-12-09 DIAGNOSIS — E119 Type 2 diabetes mellitus without complications: Secondary | ICD-10-CM | POA: Diagnosis not present

## 2023-12-09 DIAGNOSIS — H524 Presbyopia: Secondary | ICD-10-CM | POA: Diagnosis not present

## 2023-12-09 DIAGNOSIS — H5203 Hypermetropia, bilateral: Secondary | ICD-10-CM | POA: Diagnosis not present

## 2023-12-09 DIAGNOSIS — H52223 Regular astigmatism, bilateral: Secondary | ICD-10-CM | POA: Diagnosis not present

## 2023-12-09 LAB — OPHTHALMOLOGY REPORT-SCANNED

## 2023-12-12 ENCOUNTER — Other Ambulatory Visit (HOSPITAL_COMMUNITY): Payer: Self-pay

## 2023-12-16 ENCOUNTER — Other Ambulatory Visit (HOSPITAL_COMMUNITY): Payer: Self-pay

## 2023-12-16 ENCOUNTER — Encounter: Payer: Self-pay | Admitting: Cardiology

## 2023-12-16 ENCOUNTER — Ambulatory Visit: Attending: Cardiology | Admitting: Cardiology

## 2023-12-16 VITALS — BP 90/68 | HR 88 | Resp 16 | Ht 71.0 in | Wt 166.6 lb

## 2023-12-16 DIAGNOSIS — Z95820 Peripheral vascular angioplasty status with implants and grafts: Secondary | ICD-10-CM

## 2023-12-16 DIAGNOSIS — I252 Old myocardial infarction: Secondary | ICD-10-CM | POA: Diagnosis not present

## 2023-12-16 DIAGNOSIS — F17210 Nicotine dependence, cigarettes, uncomplicated: Secondary | ICD-10-CM

## 2023-12-16 DIAGNOSIS — I255 Ischemic cardiomyopathy: Secondary | ICD-10-CM

## 2023-12-16 DIAGNOSIS — E1159 Type 2 diabetes mellitus with other circulatory complications: Secondary | ICD-10-CM

## 2023-12-16 DIAGNOSIS — I4729 Other ventricular tachycardia: Secondary | ICD-10-CM

## 2023-12-16 DIAGNOSIS — I2510 Atherosclerotic heart disease of native coronary artery without angina pectoris: Secondary | ICD-10-CM

## 2023-12-16 DIAGNOSIS — E782 Mixed hyperlipidemia: Secondary | ICD-10-CM

## 2023-12-16 MED ORDER — SACUBITRIL-VALSARTAN 24-26 MG PO TABS
1.0000 | ORAL_TABLET | Freq: Two times a day (BID) | ORAL | 3 refills | Status: AC
  Filled 2023-12-16: qty 180, 90d supply, fill #0
  Filled 2024-03-26 (×2): qty 180, 90d supply, fill #1

## 2023-12-16 NOTE — Progress Notes (Signed)
 Cardiology Office Note:  .   ID:  Jonathan Barker, DOB [birth year], MRN [id number] PCP:  Leontine Lapine, MD  Former Cardiology Providers: Dr. Gordy Bergamo  [facility] Providers Cardiologist:  Madonna Large, DO , [facility] (established care March 2021) Electrophysiologist:  None  Click to update primary MD,subspecialty MD or APP then REFRESH:1}    Chief Complaint  Patient presents with   Coronary artery disease involving native coronary artery of   Follow-up    History of Present Illness: .   Jonathan Barker is a 58 y.o. Caucasian male whose past medical history and cardiovascular risk factors includes: Hx of non-STEMI, established coronary artery disease with PCI, ischemic cardiomyopathy, hypertension, hyperlipidemia, Aortic atherosclerosis, non- insulin  diabetes mellitus type 2, history of nonsustained ventricular tachycardia, active tobacco use, history of astrocytoma.   Patient is accompanied by his daughter at today's office visit.  In March 2021 patient presented to the hospital with chest pain and ruled in for NSTEMI.  He underwent angiography and was noted to have obstructive disease and successfully underwent intervention to the LAD as noted below.  In the recent past patient's LVEF was reduced but has not been experiencing anginal chest pain or heart failure symptoms.  Cardiac PET/CT in March 2025 which reported no evidence of ischemia but findings consistent with prior infarction.  Patient is currently on Jardiance , Lopressor , Entresto , Ozempic , aspirin  and statin therapy.  At last visit we added spironolactone  to his further optimize his GDMT.  He had a recent echocardiogram in July 2025 which noted LVEF of 35%, grade 1 diastolic dysfunction, no significant valvular heart disease.  He presents today for follow-up.  Since last office visit he saw Jonathan Barker in May 2025 at that time no changes were made with the exception of ordering an echo in July 2025.  Clinically he  denies anginal chest pain or heart failure symptoms.  Still plays golf regularly.  His blood pressures at today's office visit are soft but states that he took all of his heart failure medications earlier this morning.  Clinically denies lightheaded, dizziness, near-syncope or syncopal event.    After reviewing his echocardiogram results I did discuss being evaluated for AICD as we have had this discussion in the past.  Patient states that he would like to hold off for now and we will discuss at the next office visit.    Unfortunately, continues to smoke 10 cigarettes/day.  Review of Systems: .   Review of Systems  Cardiovascular:  Negative for chest pain, claudication, irregular heartbeat, leg swelling, near-syncope, orthopnea, palpitations, paroxysmal nocturnal dyspnea and syncope.  Respiratory:  Negative for shortness of breath.   Hematologic/Lymphatic: Negative for bleeding problem.   Studies Reviewed:    Echocardiogram: 07/15/2019: LVEF 30-35%, see report for additional details  08/26/2020:  Left ventricle cavity is normal in size. Mild concentric hypertrophy of the left ventricle. Mid to distal anteroseptal and apical severe hypokinesis. LVEF 40-45%. Grade 1 diastolic dysfunction, normal LAP.  Mild (Grade I) mitral regurgitation.  No evidence of pulmonary hypertension.   January 2025 LVEF: 25-30%, global hypokinesis Diastolic Function: Grade 1 Right ventricular function and size normal Aorta: Aortic root 40 mm Estimated RAP 3 mmHg   July 2025:  1. Left ventricular ejection fraction, by estimation, is 35%. The left ventricle has moderately decreased function. Left ventricular diastolic parameters are consistent with Grade I diastolic dysfunction (impaired  relaxation).   2. Right ventricular systolic function is normal. The right ventricular size is normal.  3. The mitral valve is normal in structure. Mild mitral valve regurgitation.   4. The aortic valve is tricuspid. Aortic  valve regurgitation is not visualized. Aortic valve sclerosis/calcification is present, without any evidence of aortic stenosis.   Left Heart Catheterization 07/15/19:  LV: Mid to distal anterior, anterolateral and apical akinesis, EF 35 to at most 40%.  Hand contrast injection, not well visualized.  Normal LVEDP. RCA: Dominant, mild diffuse disease. Left main: Mildly calcified. LAD: Diffusely diseased proximal segment, mid segment had a 90% stenosis.  Mid to distal segment has minimal disease and apical LAD had 99% stenosis.  Successful PTCA and stenting of the proximal/ostial and mid LAD with implantation of a long 3.0 x 38 mm resolute Onyx DES postdilated with 3.5 x 12 mm noncompliant sapphire balloon.  90% stenosis reduced to 0% with TIMI-3 to TIMI-3 flow.  Apical LAD stenosis left alone. Ramus intermediate: Large vessel, ostium has ulcerated appearing 99% stenosis.  Upon angioplasty realized it was also calcified and probably chronic.  Successful PTCA and stenting with 2 overlapping 2.5 x 15 and distally 2.5 x 13 mm resolute Onyx DES.  9 9% stenosis reduced to 0% with TIMI-3 to TIMI-3 flow. Circumflex: Moderate to large vessel, proximal to mid segment is diffusely diseased 30%.  Large OM1 with proximal long 60% to at most 70% stenosis.  Cardiac PET/CT 07/20/2023   Findings are consistent with infarction. The study is high risk due to infarct burden.   LV perfusion is abnormal. There is no evidence of ischemia. There is evidence of infarction. Defect 1: There is a large defect with absent uptake present in the apical to basal anterior location(s) that is fixed. There is abnormal wall motion in the defect area. Consistent with infarction.   Rest left ventricular function is abnormal. Rest global function is moderately reduced. Rest EF: 30%. Stress left ventricular function is abnormal. Stress global function is mildly reduced. Stress EF: 40%. End diastolic cavity size is moderately enlarged. End  systolic cavity size is mildly enlarged.   Myocardial blood flow was computed to be 0.51ml/g/min at rest and 2.23ml/g/min at stress. Global myocardial blood flow reserve was 2.55 and was normal.   Coronary calcium  assessment not performed due to prior revascularization. Aortic Atherosclerosis. Radiology: 1. Emphysema and mild diffuse bilateral bronchial wall thickening. 2. Coronary artery disease.   Aortic Atherosclerosis (ICD10-I70.0).  RADIOLOGY: NA  Risk Assessment/Calculations:   N/A   Labs:       Latest Ref Rng & Units 07/16/2019    2:28 AM 07/15/2019    3:10 AM 07/14/2019    9:54 PM  CBC  WBC 4.0 - 10.5 K/uL 21.8  20.2  17.0   Hemoglobin 13.0 - 17.0 g/dL 85.4  82.8  81.8   Hematocrit 39.0 - 52.0 % 42.4  49.7  54.0   Platelets 150 - 400 K/uL 299  330  345        Latest Ref Rng & Units 08/31/2023    1:11 PM 11/10/2022   10:13 AM 01/20/2022   10:25 AM  BMP  Glucose 70 - 99 mg/dL 839  83  841   BUN 6 - 24 mg/dL 17  18  9    Creatinine 0.76 - 1.27 mg/dL 9.11  9.09  9.26   BUN/Creat Ratio 9 - 20 19  20  12    Sodium 134 - 144 mmol/L 139  143  139   Potassium 3.5 - 5.2 mmol/L 4.2  4.2  3.9   Chloride  96 - 106 mmol/L 103  102  104   CO2 20 - 29 mmol/L 22  21  22    Calcium  8.7 - 10.2 mg/dL 9.4  9.9  9.5       Latest Ref Rng & Units 08/31/2023    1:11 PM 11/10/2022   10:13 AM 01/20/2022   10:25 AM  CMP  Glucose 70 - 99 mg/dL 839  83  841   BUN 6 - 24 mg/dL 17  18  9    Creatinine 0.76 - 1.27 mg/dL 9.11  9.09  9.26   Sodium 134 - 144 mmol/L 139  143  139   Potassium 3.5 - 5.2 mmol/L 4.2  4.2  3.9   Chloride 96 - 106 mmol/L 103  102  104   CO2 20 - 29 mmol/L 22  21  22    Calcium  8.7 - 10.2 mg/dL 9.4  9.9  9.5   Total Protein 6.0 - 8.5 g/dL  7.3    Total Bilirubin 0.0 - 1.2 mg/dL  0.2    Alkaline Phos 44 - 121 IU/L  104    AST 0 - 40 IU/L  25    ALT 0 - 44 IU/L  37      Lab Results  Component Value Date   CHOL 104 11/10/2022   HDL 35 (L) 11/10/2022   LDLCALC 51  11/10/2022   TRIG 91 11/10/2022   CHOLHDL 3.0 11/10/2022   No results for input(s): LIPOA in the last 8760 hours. No components found for: NTPROBNP No results for input(s): PROBNP in the last 8760 hours. No results for input(s): TSH in the last 8760 hours.    Physical Exam:    Today's Vitals   12/16/23 0833  BP: 90/68  Pulse: 88  Resp: 16  SpO2: 91%  Weight: 166 lb 9.6 oz (75.6 kg)  Height: 5' 11 (1.803 m)    Body mass index is 23.24 kg/m. Wt Readings from Last 3 Encounters:  12/16/23 166 lb 9.6 oz (75.6 kg)  09/08/23 172 lb (78 kg)  08/22/23 174 lb (78.9 kg)    Physical Exam  Constitutional: No distress.  Age appropriate, hemodynamically stable.   Neck: No JVD present.  Cardiovascular: Normal rate, regular rhythm, S1 normal, S2 normal, intact distal pulses and normal pulses. Exam reveals no gallop, no S3 and no S4.  Murmur heard. Holosystolic murmur is present with a grade of 3/6 at the apex. Pulmonary/Chest: No stridor. He has no wheezes. He has no rales.  Decreased breath sounds bilaterally, poor inspiratory effort  Abdominal: Soft. Bowel sounds are normal. He exhibits no distension. There is no abdominal tenderness.  Musculoskeletal:        General: No edema.     Cervical back: Neck supple.  Neurological: He is alert and oriented to person, place, and time. He has intact cranial nerves (2-12).  Skin: Skin is warm and moist.     Impression & Recommendation(s):  Impression:   ICD-10-CM   1. Coronary artery disease involving native coronary artery of native heart without angina pectoris  I25.10 EKG 12-Lead    2. Hx of non-ST elevation myocardial infarction (NSTEMI)  I25.2     3. S/P angioplasty with stent  Z95.820     4. Ischemic cardiomyopathy  I25.5 MR CARDIAC MORPHOLOGY W WO CONTRAST    sacubitril -valsartan  (ENTRESTO ) 24-26 MG    Hemoglobin and hematocrit, blood    Hemoglobin and hematocrit, blood    5. Nonsustained ventricular tachycardia  (HCC)  I47.29     6. Type 2 diabetes mellitus with other circulatory complication, without long-term current use of insulin  (HCC)  E11.59     7. Mixed hyperlipidemia  E78.2     8. Continuous dependence on cigarette smoking  F17.210      Recommendation(s):  Coronary artery disease involving native coronary artery of native heart without angina pectoris Hx of non-ST elevation myocardial infarction (NSTEMI) S/P angioplasty with stent Ischemic cardiomyopathy March 2021: LVEF 30-35% April 2022: LVEF 40-45% January 2025: LVEF 25-30% July 2025: LVEF 35% NYHA class II Overall euvolemic on physical examination. Continue Entresto  24/26 mg p.o. twice daily. Continue spironolactone  25 mg p.o. every afternoon. Continue Lopressor  50 mg p.o. twice daily Continues Jardiance  10 mg p.o. every morning Unable to uptitrate GDMT due to soft blood pressures as mentioned above. Given his ischemic cardiomyopathy and LVEF of 35% to be discussed being referred to EP for possible discussion for ICD implant for primary prevention.  Patient would like to hold off for now after discussing the risks, benefits, and alternatives. Shared decision was to continue GDMT for additional 3 months and will repeat a cardiac MRI prior to next office visit.  If the LVEF improves he would not be a candidate for ICD implant; however, if the LVEF reduces or remains stable it could be considered based on the patient's goals of care. His daughter was available during today's office visit who is in agreement with the patient's wishes. Refilled Entresto   Nonsustained ventricular tachycardia (HCC) History of. Clinically asymptomatic. On Lopressor  as opposed to metoprolol  succinate due to soft blood pressure and the ease of being able to titrate the dose. EKG today illustrates PVCs in a bigeminy pattern  Type 2 diabetes mellitus with other circulatory complication, without long-term current use of insulin  (HCC) Reemphasize  importance of glycemic control. Currently on ARNI, Jardiance , Ozempic , Most recent A1c 6.10 August 2023  Mixed hyperlipidemia Continue Lipitor  80 mg p.o. nightly  Tobacco use Tobacco cessation counseling: Currently smoking 10 cigarettes/day Patient denies claudication He is informed of the dangers of tobacco abuse including stroke, cancer, and MI, as well as benefits of tobacco cessation. He is willing to quit at this time. 5 mins were spent counseling patient cessation techniques. We discussed various methods to help quit smoking, including deciding on a date to quit, joining a support group, pharmacological agents- nicotine  gum/patch/lozenges.  I will reassess his progress at the next follow-up visit  Orders Placed:  Orders Placed This Encounter  Procedures   MR CARDIAC MORPHOLOGY W WO CONTRAST    Standing Status:   Future    Expected Date:   03/17/2024    Expiration Date:   12/15/2024    If indicated for the ordered procedure, I authorize the administration of contrast media per Radiology protocol:   Yes    What is the patient's sedation requirement?:   No Sedation    Does the patient have a pacemaker or implanted devices?:   No    Preferred imaging location?:   [facility] (table limit - 500lbs)   Hemoglobin and hematocrit, blood    Standing Status:   Future    Number of Occurrences:   1    Expected Date:   03/05/2024    Expiration Date:   12/15/2024   EKG 12-Lead    Final Medication List:    Meds ordered this encounter  Medications   sacubitril -valsartan  (ENTRESTO ) 24-26 MG    Sig: Take 1 tablet by mouth 2 (two)  times daily.    Dispense:  180 tablet    Refill:  3    Medications Discontinued During This Encounter  Medication Reason   sacubitril -valsartan  (ENTRESTO ) 24-26 MG Reorder      Current Outpatient Medications:    aspirin  81 MG chewable tablet, Chew 1 tablet (81 mg total) by mouth daily., Disp: 90 tablet, Rfl: 3   atorvastatin  (LIPITOR ) 80 MG tablet,  TAKE 1 TABLET BY MOUTH DAILY AT 6 PM., Disp: 90 tablet, Rfl: 3   blood glucose meter kit and supplies KIT, Dispense based on patient and insurance preference. Use up to four times daily as directed. (FOR ICD-9 250.00, 250.01)., Disp: 1 each, Rfl: 0   Blood Pressure Monitoring (BLOOD PRESSURE CUFF) MISC, 1 each by Does not apply route daily as needed., Disp: 1 each, Rfl: 0   ciclopirox (PENLAC) 8 % solution, 1 application Externally Once a day for 90 days, Disp: , Rfl:    empagliflozin  (JARDIANCE ) 10 MG TABS tablet, Take 1 tablet (10 mg total) by mouth daily before breakfast., Disp: 30 tablet, Rfl: 3   gabapentin  (NEURONTIN ) 600 MG tablet, Take 1 tablet (600 mg total) by mouth 2 (two) times daily., Disp: 180 tablet, Rfl: 1   Insulin  Pen Needle (PEN NEEDLES) 32G X 4 MM MISC, 1 pen by Does not apply route as directed., Disp: 100 each, Rfl: 11   metoprolol  tartrate (LOPRESSOR ) 50 MG tablet, Take 1 tablet (50 mg total) by mouth 2 (two) times daily.HOLD IF SYSTOLIC BP (TOP BLOOD PRESSURE NUMBER) LESS THAN 100 MMHG OR HEART RATE LESS THAN 60 BPM (PULSE)., Disp: 180 tablet, Rfl: 2   mupirocin  ointment (BACTROBAN ) 2 %, Apply topically as directed daily., Disp: 22 g, Rfl: 0   nitroGLYCERIN  (NITROSTAT ) 0.4 MG SL tablet, Place 1 tablet (0.4 mg total) under the tongue every 5 (five) minutes x 3 doses as needed for chest pain., Disp: 90 tablet, Rfl: 3   NON FORMULARY, Take 1 tablet by mouth daily. OTC Nerve Vive, Disp: , Rfl:    oxyCODONE -acetaminophen  (PERCOCET/ROXICET) 5-325 MG tablet, Take 1 tablet by mouth at bedtime as needed for severe pain (pain score 7-10) (may repeat one additional tablet if needed for severe pain only). Rx will be refilled every 6 weeks, Disp: 60 tablet, Rfl: 0   phenytoin  (DILANTIN ) 100 MG ER capsule, Take 2 capsules (200 mg total) by mouth in the morning., Disp: 180 capsule, Rfl: 0   Semaglutide , 1 MG/DOSE, 4 MG/3ML SOPN, Inject 1 mg into the skin once a week., Disp: 3 mL, Rfl: 3    spironolactone  (ALDACTONE ) 25 MG tablet, Take 1 tablet (25 mg total) by mouth daily., Disp: 90 tablet, Rfl: 3   sacubitril -valsartan  (ENTRESTO ) 24-26 MG, Take 1 tablet by mouth 2 (two) times daily., Disp: 180 tablet, Rfl: 3  Consent:   NA  Disposition:   3 months after cMRI  His questions and concerns were addressed to his satisfaction. He voices understanding of the recommendations provided during this encounter.    Signed, Madonna Michele HAS, [facility]  A Division of [facility] [address] [place], [place] [address]  [place], [place] [address] 9:20 AM 12/16/23

## 2023-12-16 NOTE — Patient Instructions (Addendum)
 Medication Instructions:  Refill for Entresto  has been sent to your pharmacy.  *If you need a refill on your cardiac medications before your next appointment, please call your pharmacy*  Lab Work: To be completed 1 week before cardiac MRI: hemoglobin/ hematocrit  If you have labs (blood work) drawn today and your tests are completely normal, you will receive your results only by: MyChart Message (if you have MyChart) OR A paper copy in the mail If you have any lab test that is abnormal or we need to change your treatment, we will call you to review the results.  Testing/Procedures: Your physician has requested that you have a cardiac MRI in 3 months (November 2025). Cardiac MRI uses a computer to create images of your heart as its beating, producing both still and moving pictures of your heart and major blood vessels. For further information please visit [email or web address]. Please follow the instruction sheet given to you today for more information.   Follow-Up: At [facility], you and your health needs are our priority.  As part of our continuing mission to provide you with exceptional heart care, our providers are all part of one team.  This team includes your primary Cardiologist (physician) and Advanced Practice Providers or APPs (Physician Assistants and Nurse Practitioners) who all work together to provide you with the care you need, when you need it.  Your next appointment:   4 month(s)  Provider:   Madonna Large, DO    We recommend signing up for the patient portal called MyChart.  Sign up information is provided on this After Visit Summary.  MyChart is used to connect with patients for Virtual Visits (Telemedicine).  Patients are able to view lab/test results, encounter notes, upcoming appointments, etc.  Non-urgent messages can be sent to your provider as well.   To learn more about what you can do with MyChart, go to [email or web address].   Other Instructions     Cardiac MRI - Please arrive 30-45 minutes prior to test start time. ?   [facility] [address] [place], [place] [address] [phone] Please take advantage of the free valet parking available at the [facility] [employer] (Entrance C).  Proceed to the [facility] Radiology Department (First Floor) for check-in.    Magnetic resonance imaging (MRI) is a painless test that produces images of the inside of the body without using Xrays.   During an MRI, strong magnets and radio waves work together in a Data processing manager to form detailed images.    MRI images may provide more details about a medical condition than X-rays, CT scans, and ultrasounds can provide.   You may be given earphones to listen for instructions.   You may eat a light breakfast and take medications as ordered with the exception of furosemide, hydrochlorothiazide, or spironolactone  (fluid pill, other). Please avoid stimulants for 12 hr prior to test. (Ie. Caffeine, nicotine , chocolate, or antihistamine medications)   If a contrast material will be used, an IV will be inserted into one of your veins. Contrast material will be injected into your IV. It will leave your body through your urine within a day. You may be told to drink plenty of fluids to help flush the contrast material out of your system.   You will be asked to remove all metal, including: Watch, jewelry, and other metal objects including hearing aids, hair pieces and dentures. Also wearable glucose monitoring systems (ie. Freestyle Libre and Omnipods) (Braces and fillings normally  are not a problem.)   TEST WILL TAKE APPROXIMATELY 1 HOUR   PLEASE NOTIFY SCHEDULING AT LEAST 24 HOURS IN ADVANCE IF YOU ARE UNABLE TO KEEP YOUR APPOINTMENT. [phone]   For more information and frequently asked questions, please visit our website : [email or web address]   Please call Camie Shutter, cardiac imaging nurse navigator with any  questions/concerns. Camie Shutter RN Navigator Cardiac Imaging Chantal Requena RN Navigator Cardiac Imaging [facility] [phone] Office

## 2023-12-20 ENCOUNTER — Other Ambulatory Visit: Payer: Self-pay | Admitting: Internal Medicine

## 2023-12-20 ENCOUNTER — Other Ambulatory Visit (HOSPITAL_COMMUNITY): Payer: Self-pay

## 2023-12-20 DIAGNOSIS — Z794 Long term (current) use of insulin: Secondary | ICD-10-CM

## 2023-12-24 ENCOUNTER — Other Ambulatory Visit (HOSPITAL_COMMUNITY): Payer: Self-pay

## 2023-12-26 ENCOUNTER — Other Ambulatory Visit (HOSPITAL_COMMUNITY): Payer: Self-pay

## 2023-12-26 ENCOUNTER — Other Ambulatory Visit: Payer: Self-pay

## 2023-12-26 DIAGNOSIS — Z794 Long term (current) use of insulin: Secondary | ICD-10-CM

## 2023-12-26 NOTE — Telephone Encounter (Signed)
 Copied from CRM #[id number]. Topic: Clinical - Medication Refill >> Dec 26, 2023 11:51 AM Diannia H wrote: Medication: atorvastatin  (LIPITOR ) 80 MG tablet, Semaglutide , 1 MG/DOSE, 4 MG/3ML SOPN  Has the patient contacted their pharmacy? Yes (Agent: If no, request that the patient contact the pharmacy for the refill. If patient does not wish to contact the pharmacy document the reason why and proceed with request.) (Agent: If yes, when and what did the pharmacy advise?)  This is the patient's preferred pharmacy:  [place] [facility] [address] [place] [address] Phone: [phone] Fax: [phone]  Is this the correct pharmacy for this prescription? Yes If no, delete pharmacy and type the correct one.   Has the prescription been filled recently? No  Is the patient out of the medication? Yes  Has the patient been seen for an appointment in the last year OR does the patient have an upcoming appointment? Yes  Can we respond through MyChart? Yes  Agent: Please be advised that Rx refills may take up to 3 business days. We ask that you follow-up with your pharmacy.

## 2023-12-26 NOTE — Telephone Encounter (Signed)
 Copied from CRM #[id number]. Topic: Clinical - Medication Refill >> Dec 26, 2023  8:27 AM Susanna ORN wrote: Medication: Semaglutide , 1 MG/DOSE, 4 MG/3ML SOPN   Has the patient contacted their pharmacy? Yes, patient went to pharmacy & was told the provider needs to call it in. Previous provider is no longer in the clinic.  (Agent: If no, request that the patient contact the pharmacy for the refill. If patient does not wish to contact the pharmacy document the reason why and proceed with request.) (Agent: If yes, when and what did the pharmacy advise?)  This is the patient's preferred pharmacy:  [place] [facility] [address] [place] [address] Phone: [phone] Fax: [phone]  Is this the correct pharmacy for this prescription? Yes If no, delete pharmacy and type the correct one.   Has the prescription been filled recently? Yes  Is the patient out of the medication? Yes  Has the patient been seen for an appointment in the last year OR does the patient have an upcoming appointment? Yes  Can we respond through MyChart? No  Agent: Please be advised that Rx refills may take up to 3 business days. We ask that you follow-up with your pharmacy.

## 2023-12-27 ENCOUNTER — Other Ambulatory Visit (HOSPITAL_COMMUNITY): Payer: Self-pay

## 2023-12-27 ENCOUNTER — Other Ambulatory Visit: Payer: Self-pay | Admitting: Internal Medicine

## 2023-12-27 DIAGNOSIS — E1159 Type 2 diabetes mellitus with other circulatory complications: Secondary | ICD-10-CM

## 2023-12-27 MED ORDER — OZEMPIC (1 MG/DOSE) 4 MG/3ML ~~LOC~~ SOPN
1.0000 mg | PEN_INJECTOR | SUBCUTANEOUS | 3 refills | Status: DC
  Filled 2023-12-27: qty 3, 28d supply, fill #0
  Filled 2024-01-25 (×2): qty 3, 28d supply, fill #1
  Filled 2024-02-22 (×2): qty 3, 28d supply, fill #2
  Filled 2024-03-26 (×4): qty 3, 28d supply, fill #3

## 2023-12-27 NOTE — Telephone Encounter (Signed)
 Copied from CRM #[id number]. Topic: Clinical - Medication Refill >> Dec 26, 2023  8:27 AM Susanna ORN wrote: Medication: Semaglutide , 1 MG/DOSE, 4 MG/3ML SOPN   Has the patient contacted their pharmacy? Yes, patient went to pharmacy & was told the provider needs to call it in. Previous provider is no longer in the clinic.  (Agent: If no, request that the patient contact the pharmacy for the refill. If patient does not wish to contact the pharmacy document the reason why and proceed with request.) (Agent: If yes, when and what did the pharmacy advise?)  This is the patient's preferred pharmacy:  [place] [facility] [address] [place] [address] Phone: [phone] Fax: [phone]  Is this the correct pharmacy for this prescription? Yes If no, delete pharmacy and type the correct one.   Has the prescription been filled recently? Yes  Is the patient out of the medication? Yes  Has the patient been seen for an appointment in the last year OR does the patient have an upcoming appointment? Yes  Can we respond through MyChart? No  Agent: Please be advised that Rx refills may take up to 3 business days. We ask that you follow-up with your pharmacy. >> Dec 27, 2023  9:38 AM Miquel SAILOR wrote: Semaglutide , 1 MG/DOSE, 4 MG/3ML SOPN  Patient need refill ASAP due to out since 08/19. Needs call back [phone]

## 2023-12-28 ENCOUNTER — Other Ambulatory Visit (HOSPITAL_COMMUNITY): Payer: Self-pay

## 2023-12-28 ENCOUNTER — Other Ambulatory Visit: Payer: Self-pay | Admitting: Internal Medicine

## 2023-12-28 MED ORDER — ATORVASTATIN CALCIUM 80 MG PO TABS
ORAL_TABLET | Freq: Every day | ORAL | 3 refills | Status: AC
  Filled 2023-12-28 – 2023-12-30 (×2): qty 90, 90d supply, fill #0
  Filled 2024-03-26 (×2): qty 90, 90d supply, fill #1

## 2023-12-30 ENCOUNTER — Other Ambulatory Visit (HOSPITAL_COMMUNITY): Payer: Self-pay

## 2024-01-16 ENCOUNTER — Other Ambulatory Visit (HOSPITAL_COMMUNITY): Payer: Self-pay

## 2024-01-16 ENCOUNTER — Other Ambulatory Visit: Payer: Self-pay | Admitting: Urology

## 2024-01-16 ENCOUNTER — Telehealth: Payer: Self-pay

## 2024-01-16 MED ORDER — OXYCODONE-ACETAMINOPHEN 5-325 MG PO TABS
1.0000 | ORAL_TABLET | Freq: Every evening | ORAL | 0 refills | Status: DC | PRN
  Filled 2024-01-16: qty 60, 42d supply, fill #0

## 2024-01-16 NOTE — Telephone Encounter (Signed)
 RN called Mr. Ragain no answer left message that he could pick up medication refill at [facility].

## 2024-01-18 ENCOUNTER — Telehealth: Payer: Self-pay | Admitting: *Deleted

## 2024-01-18 ENCOUNTER — Other Ambulatory Visit (HOSPITAL_COMMUNITY): Payer: Self-pay

## 2024-01-18 NOTE — Telephone Encounter (Signed)
CALLED PATIENT TO INFORM THAT SCRIPT IS READY FOR PICK-UP, SPOKE WITH PATIENT AND HE IS AWARE OF THIS. 

## 2024-01-25 ENCOUNTER — Other Ambulatory Visit (HOSPITAL_COMMUNITY): Payer: Self-pay

## 2024-02-01 ENCOUNTER — Other Ambulatory Visit (HOSPITAL_COMMUNITY): Payer: Self-pay

## 2024-02-01 ENCOUNTER — Other Ambulatory Visit: Payer: Self-pay | Admitting: Neurology

## 2024-02-03 ENCOUNTER — Other Ambulatory Visit (HOSPITAL_COMMUNITY): Payer: Self-pay

## 2024-02-03 ENCOUNTER — Other Ambulatory Visit: Payer: Self-pay

## 2024-02-03 MED ORDER — PHENYTOIN SODIUM EXTENDED 100 MG PO CAPS
200.0000 mg | ORAL_CAPSULE | Freq: Every morning | ORAL | 0 refills | Status: DC
  Filled 2024-02-03: qty 180, 90d supply, fill #0

## 2024-02-07 ENCOUNTER — Other Ambulatory Visit (HOSPITAL_COMMUNITY): Payer: Self-pay

## 2024-02-07 ENCOUNTER — Ambulatory Visit: Payer: Self-pay

## 2024-02-11 ENCOUNTER — Other Ambulatory Visit (HOSPITAL_COMMUNITY): Payer: Self-pay

## 2024-02-16 ENCOUNTER — Other Ambulatory Visit (HOSPITAL_COMMUNITY): Payer: Self-pay

## 2024-02-16 ENCOUNTER — Ambulatory Visit (INDEPENDENT_AMBULATORY_CARE_PROVIDER_SITE_OTHER): Payer: Self-pay

## 2024-02-16 VITALS — BP 109/81 | HR 82 | Ht 71.0 in | Wt 172.4 lb

## 2024-02-16 DIAGNOSIS — Z951 Presence of aortocoronary bypass graft: Secondary | ICD-10-CM

## 2024-02-16 DIAGNOSIS — Z2821 Immunization not carried out because of patient refusal: Secondary | ICD-10-CM | POA: Diagnosis not present

## 2024-02-16 DIAGNOSIS — F17210 Nicotine dependence, cigarettes, uncomplicated: Secondary | ICD-10-CM | POA: Diagnosis not present

## 2024-02-16 DIAGNOSIS — Z7984 Long term (current) use of oral hypoglycemic drugs: Secondary | ICD-10-CM

## 2024-02-16 DIAGNOSIS — E1159 Type 2 diabetes mellitus with other circulatory complications: Secondary | ICD-10-CM | POA: Diagnosis not present

## 2024-02-16 DIAGNOSIS — Z720 Tobacco use: Secondary | ICD-10-CM

## 2024-02-16 DIAGNOSIS — Z833 Family history of diabetes mellitus: Secondary | ICD-10-CM | POA: Diagnosis not present

## 2024-02-16 DIAGNOSIS — Z8249 Family history of ischemic heart disease and other diseases of the circulatory system: Secondary | ICD-10-CM | POA: Diagnosis not present

## 2024-02-16 DIAGNOSIS — I5022 Chronic systolic (congestive) heart failure: Secondary | ICD-10-CM | POA: Diagnosis not present

## 2024-02-16 DIAGNOSIS — Z794 Long term (current) use of insulin: Secondary | ICD-10-CM | POA: Diagnosis not present

## 2024-02-16 DIAGNOSIS — Z79899 Other long term (current) drug therapy: Secondary | ICD-10-CM

## 2024-02-16 DIAGNOSIS — Z7985 Long-term (current) use of injectable non-insulin antidiabetic drugs: Secondary | ICD-10-CM

## 2024-02-16 DIAGNOSIS — Z955 Presence of coronary angioplasty implant and graft: Secondary | ICD-10-CM

## 2024-02-16 DIAGNOSIS — Z0000 Encounter for general adult medical examination without abnormal findings: Secondary | ICD-10-CM

## 2024-02-16 DIAGNOSIS — I255 Ischemic cardiomyopathy: Secondary | ICD-10-CM | POA: Diagnosis not present

## 2024-02-16 LAB — POCT GLYCOSYLATED HEMOGLOBIN (HGB A1C): HbA1c, POC (controlled diabetic range): 6.5 % (ref 0.0–7.0)

## 2024-02-16 LAB — GLUCOSE, CAPILLARY: Glucose-Capillary: 192 mg/dL — ABNORMAL HIGH (ref 70–99)

## 2024-02-16 MED ORDER — NICOTINE POLACRILEX 4 MG MT GUM
4.0000 mg | CHEWING_GUM | OROMUCOSAL | 0 refills | Status: DC | PRN
  Filled 2024-02-16: qty 100, 30d supply, fill #0

## 2024-02-16 NOTE — Assessment & Plan Note (Signed)
 A1c today is 6.5  Plan: Microalbumin creatinine ratio at next visit as patient was unable to give urine sample today Foot exam done today Continue Jardiance  10 Continue semaglutide  1 mg

## 2024-02-16 NOTE — Assessment & Plan Note (Signed)
 Patient declined flu and pneumonia vaccine today

## 2024-02-16 NOTE — Progress Notes (Signed)
 Established Patient Office Visit  Subjective   Patient ID: Jonathan Barker, male    DOB: 1965/09/03  Age: 58 y.o. MRN: [id number]  Chief Complaint  Patient presents with   routine follow up    HPI Jonathan Barker is a 58 year old male with past medical history of type 2 diabetes mellitus, tobacco use, , HFrEF 35%, CAD, history of NSTEMI that presents today for follow-up of chronic conditions.  See problem-based assessment for more details   ROS See problem-based assessment for more details   Objective:     BP 109/81 (BP Location: Right Arm, Patient Position: Sitting, Cuff Size: Normal)   Pulse 82   Ht 5' 11 (1.803 m)   Wt 172 lb 6.4 oz (78.2 kg)   SpO2 97%   BMI 24.04 kg/m  BP Readings from Last 3 Encounters:  02/16/24 109/81  12/16/23 90/68  09/08/23 112/74   Wt Readings from Last 3 Encounters:  02/16/24 172 lb 6.4 oz (78.2 kg)  12/16/23 166 lb 9.6 oz (75.6 kg)  09/08/23 172 lb (78 kg)      Physical Exam Constitution: Alert, no acute distress Heart: Regular rate, rhythm irregular at times, no murmurs heard Lungs: Respiratory effort normal, fine crackles bilaterally posteriorly at the lower bases but otherwise clear to auscultation Lower extremities: Feet have good DP pulses bilaterally, warm extremities and no open wounds or ulcers noted  Results for orders placed or performed in visit on 02/16/24  Glucose, capillary  Result Value Ref Range   Glucose-Capillary 192 (H) 70 - 99 mg/dL  POC Hbg J8R  Result Value Ref Range   Hemoglobin A1C     HbA1c POC (<> result, manual entry)     HbA1c, POC (prediabetic range)     HbA1c, POC (controlled diabetic range) 6.5 0.0 - 7.0 %      The ASCVD Risk score (Arnett DK, et al., 2019) failed to calculate for the following reasons:   Risk score cannot be calculated because patient has a medical history suggesting prior/existing ASCVD    Assessment & Plan:   Problem List Items Addressed This Visit     Type II  diabetes mellitus (HCC) - Primary (Chronic)   A1c today is 6.5  Plan: Microalbumin creatinine ratio at next visit as patient was unable to give urine sample today Foot exam done today Continue Jardiance  10 Continue semaglutide  1 mg      Relevant Orders   POC Hbg A1C (Completed)   Tobacco use   Patient has not tried Chantix  before.  Patient is interested in quitting smoking at this time for his overall health.  Patient has been smoking 1.5 pack for about 40 years.  He is currently smoking half a pack per day and has been able to cut down on its own.  Plan: I have sent in nicotine  gum and counseled patient on not chewing his gum but keeping his cheek.  Patient states he will try this to help him quit smoking Patient had low-dose CT chest last year, will repeat this year and sent order for this      Relevant Orders   CT CHEST LUNG CA SCREEN LOW DOSE W/O CM   Healthcare maintenance   Patient declined flu and pneumonia vaccine today      Chronic HFrEF (heart failure with reduced ejection fraction) [facility])   Patient reports that cardiologist is recommending defibrillator.  Did counsel patient states that we agree that defibrillator would benefit patient with his EF  Return in about 3 months (around 05/18/2024).    Jonathan Kaffenberger D'Mello, DO Patient seen by Dr. Jeanelle

## 2024-02-16 NOTE — Assessment & Plan Note (Signed)
 Patient has not tried Chantix  before.  Patient is interested in quitting smoking at this time for his overall health.  Patient has been smoking 1.5 pack for about 40 years.  He is currently smoking half a pack per day and has been able to cut down on its own.  Plan: I have sent in nicotine  gum and counseled patient on not chewing his gum but keeping his cheek.  Patient states he will try this to help him quit smoking Patient had low-dose CT chest last year, will repeat this year and sent order for this

## 2024-02-16 NOTE — Assessment & Plan Note (Signed)
 Patient reports that cardiologist is recommending defibrillator.  Did counsel patient states that we agree that defibrillator would benefit patient with his EF

## 2024-02-16 NOTE — Patient Instructions (Addendum)
 Today we discussed the following medical conditions and plan:   Im glad that your diabetes is doing well!   We will get that scan of your lungs done. I have also sent in the nicotine  gum for you  We look forward to seeing you next time. Please call our clinic at [phone] if you have any questions or concerns. The best time to call is Monday-Friday from 9am-4pm, but there is someone available 24/7. If you need medication refills, please notify your pharmacy one week in advance and they will send us  a request.   Thank you for trusting me with your care. Wishing you the best!   Anish Vana D'Mello, DO  [facility]

## 2024-02-17 LAB — HEMOGLOBIN AND HEMATOCRIT, BLOOD
Hematocrit: 49.2 % (ref 37.5–51.0)
Hemoglobin: 16.3 g/dL (ref 13.0–17.7)

## 2024-02-19 NOTE — Progress Notes (Signed)
 Internal Medicine Clinic Attending  I was physically present during the key portions of the resident provided service and participated in the medical decision making of patient's management care. I reviewed pertinent patient test results.  The assessment, diagnosis, and plan were formulated together and I agree with the documentation in the resident's note.  Jeanelle Layman CROME, MD

## 2024-02-22 ENCOUNTER — Other Ambulatory Visit (HOSPITAL_COMMUNITY): Payer: Self-pay

## 2024-02-22 ENCOUNTER — Other Ambulatory Visit: Payer: Self-pay | Admitting: Cardiology

## 2024-02-24 ENCOUNTER — Ambulatory Visit
Admission: RE | Admit: 2024-02-24 | Discharge: 2024-02-24 | Disposition: A | Discharge status: Home / Self Care | Source: Ambulatory Visit | Attending: Internal Medicine | Admitting: Internal Medicine

## 2024-02-24 ENCOUNTER — Other Ambulatory Visit: Payer: Self-pay | Admitting: Cardiology

## 2024-02-24 ENCOUNTER — Other Ambulatory Visit (HOSPITAL_COMMUNITY): Payer: Self-pay

## 2024-02-24 ENCOUNTER — Other Ambulatory Visit: Payer: Self-pay | Admitting: Urology

## 2024-02-24 DIAGNOSIS — Z720 Tobacco use: Secondary | ICD-10-CM

## 2024-02-24 MED ORDER — OXYCODONE-ACETAMINOPHEN 5-325 MG PO TABS
1.0000 | ORAL_TABLET | Freq: Every evening | ORAL | 0 refills | Status: DC | PRN
  Filled 2024-02-27 (×2): qty 60, 42d supply, fill #0

## 2024-02-24 MED ORDER — METOPROLOL TARTRATE 50 MG PO TABS
50.0000 mg | ORAL_TABLET | Freq: Two times a day (BID) | ORAL | 3 refills | Status: AC
  Filled 2024-02-24 – 2024-02-27 (×2): qty 180, 90d supply, fill #0

## 2024-02-25 ENCOUNTER — Ambulatory Visit: Payer: Self-pay | Admitting: Cardiology

## 2024-02-27 ENCOUNTER — Other Ambulatory Visit (HOSPITAL_COMMUNITY): Payer: Self-pay

## 2024-02-27 ENCOUNTER — Other Ambulatory Visit: Payer: Self-pay

## 2024-02-29 ENCOUNTER — Other Ambulatory Visit: Payer: Self-pay | Admitting: Student

## 2024-02-29 ENCOUNTER — Other Ambulatory Visit (HOSPITAL_COMMUNITY): Payer: Self-pay

## 2024-02-29 ENCOUNTER — Ambulatory Visit: Payer: Self-pay

## 2024-02-29 ENCOUNTER — Encounter: Payer: Self-pay | Admitting: Neurology

## 2024-02-29 ENCOUNTER — Other Ambulatory Visit

## 2024-02-29 ENCOUNTER — Telehealth: Payer: Self-pay | Admitting: *Deleted

## 2024-02-29 ENCOUNTER — Ambulatory Visit (INDEPENDENT_AMBULATORY_CARE_PROVIDER_SITE_OTHER): Admitting: Neurology

## 2024-02-29 VITALS — BP 109/55 | HR 61 | Ht 71.0 in | Wt 170.6 lb

## 2024-02-29 DIAGNOSIS — Z79899 Other long term (current) drug therapy: Secondary | ICD-10-CM

## 2024-02-29 DIAGNOSIS — G40109 Localization-related (focal) (partial) symptomatic epilepsy and epileptic syndromes with simple partial seizures, not intractable, without status epilepticus: Secondary | ICD-10-CM | POA: Diagnosis not present

## 2024-02-29 DIAGNOSIS — Z720 Tobacco use: Secondary | ICD-10-CM

## 2024-02-29 MED ORDER — PHENYTOIN SODIUM EXTENDED 100 MG PO CAPS
200.0000 mg | ORAL_CAPSULE | Freq: Every morning | ORAL | 4 refills | Status: AC
  Filled 2024-02-29: qty 180, 90d supply, fill #0

## 2024-02-29 NOTE — Telephone Encounter (Signed)
 Received a call from [facility], with CT result done on 10/24, read on 10/28, which is in EPIC.  IMPRESSION: Enlarging left upper lobe pulmonary nodule. Lung-RADS 4A, suspicious. Follow up low-dose chest CT without contrast in 3 months (please use the following order, CT CHEST LCS NODULE FOLLOW-UP W/O CM) is recommended. Alternatively, PET may be considered when there is a solid component 8mm or larger. These results will be called to the ordering clinician or representative by the Radiologist Assistant, and communication documented in the PACS or [facility] Dashboard   Extensive multi-vessel coronary artery calcification. Airway inflammation.   Aortic Atherosclerosis (ICD10-I70.0).

## 2024-02-29 NOTE — Progress Notes (Signed)
 NEUROLOGY FOLLOW UP OFFICE NOTE  Jonathan Barker [id number] [birth year]  HISTORY OF PRESENT ILLNESS: I had the pleasure of seeing Jonathan Barker in follow-up in the neurology clinic on 02/29/2024.  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 He is alone in the office today. Records and images were personally reviewed where available.  Since his last visit, he continues to do well from a seizure standpoint, seizure-free since 2015 on Dilantin  200mg  every morning, no side effects. He denies any staring/unresponsive episodes, gaps in time, olfactory/gustatory hallucinations, focal numbness/tingling/weakness, myoclonic jerks. He reports headaches at night due to his brain tumor. No dizziness, vision changes, no falls. Since his last visit, PCP has increased his gabapentin  to 600mg  BID, however today he reports that since his 2 big toenails were taken out, he has been pain-free. He gets 6 hours of sleep. He plays golf 3 days a week. Mood is good.  His last visit with Neuro-oncology was 05/2023 after his brain MRI with and without contrast which I personally reviewed, no acute changes. There is cystic change in the right frontal lobe subjacent to the craniotomy site with surrounding FLAIR hyperintensity, no enhancement. He sees Cardiology and reports improvement in EF after spironolactone  was added, he may need an AICD if LVEF is severely reduced and he is unsure about it. He had a CT chest yesterday and has not discussed results with his provider yet, it showed an enlarging left upper lobe pulmonary nodule. He reports cutting down on cigarettes to 7/day.    History on Initial Assessment 04/15/2015: This is a pleasant 58 yo RH man who had a new onset seizure last 05/18/13 and was found to have an infiltrative mass in the anteromedial right frontal lobe without enhancement, with FLAIR signal extending across the corpus callosum into the contralateral  subcortical left frontal lobe. His daughter reports that he started blinking excessively, he recalls feeling his eyes blinking at 100 mph, he tried to sit but could not because his left hand was locked inside his pocket. He was witnessed to have generalized shaking for less than a minute. He reports that he recalls the entire event, he could hear people screaming around him. His daughter reports that he was coherent right after the seizure, no post-ictal confusion or focal weakness. He was brought to [facility] where imaging revealed the bifrontal mass, biopsy demonstrated diffuse grade 2 astrocytoma. Wake and drowsy EEG was normal. He took Temodar , then underwent radiotherapy until May 2015. He denies any further seizures since January, but does feel that he may have had a seizure the week after hospital discharge in January 2015, he woke up with his arms flexed, unsure if he had dreamed he had a seizure, no associated tongue bite or incontinence. His wife had mentioned around that time that she felt the bed shaking one night. Since January 2015, he denies any further eye blinking episodes, generalized shaking or stiffening of extremities, olfactory/gustatory hallucinations, deja vu, rising epigastric sensation, focal numbness/tingling/weakness, myoclonic jerks. He has had low Dilantin  in levels in the past, and reports that he had only been taking it twice a day instead of TID, but with most recent level checked in November 2016 (<2.5), he reports taking Dilantin  100mg  TID. He denies any side effects to the medication.  He denies any prior history of headaches until brain biopsy, and since then he has been having frequent headaches. Headaches are over the frontal region, with pressure sensation,  no associated nausea, vomiting, photo/phonophobia. He reports headaches occur at the end of the day, 3-4 times a week, with good response to Percocet. He usually takes the medication and goes to bed, with no headache on  awakening. He notices more headaches with cold weather. He denies any dizziness, vision changes, dysarthria/dysphagia, neck/back pain, focal numbness/tingling/weakness, bowel/bladder dysfunction. He usually has 4 alcoholic drinks a month. He had a normal birth and early development.  There is no history of febrile convulsions, CNS infections such as meningitis/encephalitis, significant traumatic brain injury, or family history of seizures.  I personally reviewed most recent MRI brain with and without contrast done 02/14/15 which showed abnormal FLAIR signal throughout the white matter of the frontal lobes, right more than left, unchanged, with small cystic area in the left frontal cortical/subcortical region that is noted to be slightly larger (7mm) compared to last year. There is a punctate focus of enhancement in the right frontal white matter, 3mm focus of enhancement in the right basal ganglia/external capsule that is new.   PAST MEDICAL HISTORY: Past Medical History:  Diagnosis Date   ACS (acute coronary syndrome) (HCC)    Astrocytoma of frontal lobe (HCC) 05/31/2013   Claudication 09/24/2020   Coronary artery disease    DM (diabetes mellitus) (HCC) 08/09/2011   A1c at diagnosis = 12.4   Family history of breast cancer    GERD (gastroesophageal reflux disease) 08/09/2011   H/O heart artery stent 07/14/2018   Hyperlipidemia    LAD stenosis    Non-ST elevation (NSTEMI) myocardial infarction (HCC) 07/15/2019   Nonsustained ventricular tachycardia (HCC)    NSVT (nonsustained ventricular tachycardia) (HCC)    Screening for colon cancer 09/24/2020   Seizure (HCC) 05/19/2013   Shortness of breath     MEDICATIONS: Current Outpatient Medications on File Prior to Visit  Medication Sig Dispense Refill   aspirin  81 MG chewable tablet Chew 1 tablet (81 mg total) by mouth daily. 90 tablet 3   atorvastatin  (LIPITOR ) 80 MG tablet TAKE 1 TABLET BY MOUTH DAILY AT 6 PM. 90 tablet 3   blood glucose  meter kit and supplies KIT Dispense based on patient and insurance preference. Use up to four times daily as directed. (FOR ICD-9 250.00, 250.01). 1 each 0   Blood Pressure Monitoring (BLOOD PRESSURE CUFF) MISC 1 each by Does not apply route daily as needed. 1 each 0   ciclopirox (PENLAC) 8 % solution 1 application Externally Once a day for 90 days     empagliflozin  (JARDIANCE ) 10 MG TABS tablet Take 1 tablet (10 mg total) by mouth daily before breakfast. 30 tablet 3   gabapentin  (NEURONTIN ) 600 MG tablet Take 1 tablet (600 mg total) by mouth 2 (two) times daily. 180 tablet 1   Insulin  Pen Needle (PEN NEEDLES) 32G X 4 MM MISC 1 pen by Does not apply route as directed. 100 each 11   metoprolol  tartrate (LOPRESSOR ) 50 MG tablet Take 1 tablet (50 mg total) by mouth 2 (two) times daily.HOLD IF SYSTOLIC BP (TOP BLOOD PRESSURE NUMBER) LESS THAN 100 MMHG OR HEART RATE LESS THAN 60 BPM (PULSE). 180 tablet 3   mupirocin  ointment (BACTROBAN ) 2 % Apply topically as directed daily. 22 g 0   nitroGLYCERIN  (NITROSTAT ) 0.4 MG SL tablet Place 1 tablet (0.4 mg total) under the tongue every 5 (five) minutes x 3 doses as needed for chest pain. 90 tablet 3   NON FORMULARY Take 1 tablet by mouth daily. OTC Nerve Vive  oxyCODONE -acetaminophen  (PERCOCET/ROXICET) 5-325 MG tablet Take 1 tablet by mouth at bedtime as needed for severe pain (pain score 7-10) (may repeat one additional tablet if needed for severe pain only- Rx will only be refilled every 6 weeks). 60 tablet 0   phenytoin  (DILANTIN ) 100 MG ER capsule Take 2 capsules (200 mg total) by mouth in the morning. 180 capsule 0   sacubitril -valsartan  (ENTRESTO ) 24-26 MG Take 1 tablet by mouth 2 (two) times daily. 180 tablet 3   Semaglutide , 1 MG/DOSE, (OZEMPIC , 1 MG/DOSE,) 4 MG/3ML SOPN Inject 1 mg into the skin once a week. 3 mL 3   spironolactone  (ALDACTONE ) 25 MG tablet Take 1 tablet (25 mg total) by mouth daily. 90 tablet 3   nicotine  polacrilex (NICORETTE ) 4 MG  gum Take 1 (4 mg total) by mouth as needed for smoking cessation. (Patient not taking: Reported on 02/29/2024) 100 tablet 0   No current facility-administered medications on file prior to visit.    ALLERGIES: No Known Allergies  FAMILY HISTORY: Family History  Problem Relation Age of Onset   Breast cancer Mother        dx in her 61s-60s   Bone cancer Mother    Diabetes Father    Heart disease Father    Diabetes Brother    Diabetes Paternal Uncle     SOCIAL HISTORY: Social History   Socioeconomic History   Marital status: Single    Spouse name: Not on file   Number of children: 2   Years of education: 11   Highest education level: Not on file  Occupational History    Employer: [employer]  Tobacco Use   Smoking status: Every Day    Current packs/day: 1.00    Average packs/day: 1 pack/day for 33.0 years (33.0 ttl pk-yrs)    Types: Cigarettes   Smokeless tobacco: Never   Tobacco comments:    Less than 1 pack per day.  Vaping Use   Vaping status: Never Used  Substance and Sexual Activity   Alcohol use: Not Currently   Drug use: Not Currently    Types: Marijuana    Comment: Marijuana daily   Sexual activity: Not on file  Other Topics Concern   Not on file  Social History Narrative   [place]. He does not work now.    Right handed      Highest level of edu- 11th grade      Lives in one story home. Steps to enter home   Social Drivers of Health   Financial Resource Strain: Low Risk  (03/14/2023)   Overall Financial Resource Strain (CARDIA)    Difficulty of Paying Living Expenses: Not hard at all  Food Insecurity: Food Insecurity Present (03/14/2023)   Hunger Vital Sign    Worried About Running Out of Food in the Last Year: Sometimes true    Ran Out of Food in the Last Year: Sometimes true  Transportation Needs: No Transportation Needs (03/14/2023)   PRAPARE - Administrator, Civil Service (Medical): No     Lack of Transportation (Non-Medical): No  Physical Activity: Sufficiently Active (01/20/2023)   Exercise Vital Sign    Days of Exercise per Week: 7 days    Minutes of Exercise per Session: 120 min  Stress: No Stress Concern Present (01/20/2023)   [employer] of Occupational Health - Occupational Stress Questionnaire    Feeling of Stress : Not at all  Social Connections: Socially Isolated (01/20/2023)  Social Connection and Isolation Panel    Frequency of Communication with Friends and Family: More than three times a week    Frequency of Social Gatherings with Friends and Family: More than three times a week    Attends Religious Services: Never    Database Administrator or Organizations: No    Attends Banker Meetings: Never    Marital Status: Never married  Intimate Partner Violence: Not At Risk (03/14/2023)   Humiliation, Afraid, Rape, and Kick questionnaire    Fear of Current or Ex-Partner: No    Emotionally Abused: No    Physically Abused: No    Sexually Abused: No     PHYSICAL EXAM: Vitals:   02/29/24 0824  BP: (!) 109/55  Pulse: 61  SpO2: 97%   General: No acute distress Head:  Normocephalic/atraumatic Skin/Extremities: No rash, no edema Neurological Exam: alert and awake. No aphasia or dysarthria, speech is low volume and slightly mumbling at times. Fund of knowledge is appropriate.  Attention and concentration are normal.   Cranial nerves: Pupils equal, round. Extraocular movements intact with no nystagmus. Visual fields full.  No facial asymmetry.  Motor: Bulk and tone normal, muscle strength 5/5 throughout with no pronator drift.   Finger to nose testing intact.  Gait slightly wide-based, no ataxia. Minimal postural and endpoint tremor bilaterally. Romberg negative.   IMPRESSION: This is a pleasant 58 yo RH man with a history of new onset seizure last January 2015 during which he was found to have a bifrontal (right greater than left) grade II  astrocytoma. He underwent Temodar  treatment and radiotherapy. He has been seizure-free since 2015 on Dilantin  200mg  daily, refills sent. We discussed potential effects on bone health with long-term Dilantin  use, check vitamin D level and bone density scan every 3 years. Recent bloodwork reviewed, will also add on LFTs. Gabapentin  is now prescribed by PCP, he reports pain has resolved since toenails removed. He is aware of [place] driving laws to stop driving after a seizure until 6 months seizure-free. Follow-up in 1 year, call for any changes.     Thank you for allowing me to participate in his care.  Please do not hesitate to call for any questions or concerns.    Darice Shivers, M.D.   CC: Dr. Nguyen

## 2024-02-29 NOTE — Patient Instructions (Signed)
 It's always good to see you.  Have bloodwork done for vitamin D and LFTs  2. Schedule bone density scan  3. Continue Dilantin  200mg  daily  4. Follow-up in 1 year, call for any changes   Seizure Precautions: 1. If medication has been prescribed for you to prevent seizures, take it exactly as directed.  Do not stop taking the medicine without talking to your doctor first, even if you have not had a seizure in a long time.   2. Avoid activities in which a seizure would cause danger to yourself or to others.  Don't operate dangerous machinery, swim alone, or climb in high or dangerous places, such as on ladders, roofs, or girders.  Do not drive unless your doctor says you may.  3. If you have any warning that you may have a seizure, lay down in a safe place where you can't hurt yourself.    4.  No driving for 6 months from last seizure, as per [place]  state law.   Please refer to the following link on the Epilepsy Foundation of America's website for more information: http://www.epilepsyfoundation.org/answerplace/Social/driving/drivingu.cfm   5.  Maintain good sleep hygiene. Avoid alcohol.  6.  Contact your doctor if you have any problems that may be related to the medicine you are taking.  7.  Call 911 and bring the patient back to the ED if:        A.  The seizure lasts longer than 5 minutes.       B.  The patient doesn't awaken shortly after the seizure  C.  The patient has new problems such as difficulty seeing, speaking or moving  D.  The patient was injured during the seizure  E.  The patient has a temperature over 102 F (39C)  F.  The patient vomited and now is having trouble breathing

## 2024-03-01 ENCOUNTER — Other Ambulatory Visit (HOSPITAL_COMMUNITY): Payer: Self-pay

## 2024-03-01 LAB — HEPATIC FUNCTION PANEL
AG Ratio: 2 (calc) (ref 1.0–2.5)
ALT: 14 U/L (ref 9–46)
AST: 15 U/L (ref 10–35)
Albumin: 4.5 g/dL (ref 3.6–5.1)
Alkaline phosphatase (APISO): 87 U/L (ref 35–144)
Bilirubin, Direct: 0.1 mg/dL (ref 0.0–0.2)
Globulin: 2.2 g/dL (ref 1.9–3.7)
Indirect Bilirubin: 0.4 mg/dL (ref 0.2–1.2)
Total Bilirubin: 0.5 mg/dL (ref 0.2–1.2)
Total Protein: 6.7 g/dL (ref 6.1–8.1)

## 2024-03-01 LAB — VITAMIN D 25 HYDROXY (VIT D DEFICIENCY, FRACTURES): Vit D, 25-Hydroxy: 17 ng/mL — ABNORMAL LOW (ref 30–100)

## 2024-03-04 ENCOUNTER — Ambulatory Visit: Payer: Self-pay | Admitting: Neurology

## 2024-03-04 MED ORDER — VITAMIN D (ERGOCALCIFEROL) 1.25 MG (50000 UNIT) PO CAPS
50000.0000 [IU] | ORAL_CAPSULE | ORAL | 0 refills | Status: AC
  Filled 2024-03-04: qty 8, 56d supply, fill #0

## 2024-03-05 ENCOUNTER — Other Ambulatory Visit (HOSPITAL_COMMUNITY): Payer: Self-pay

## 2024-03-05 NOTE — Telephone Encounter (Signed)
-----   Message from Darice CHRISTELLA Shivers sent at 03/04/2024 11:04 PM EST ----- Pls let him know liver function is normal. His vitamin D level is very low, I sent in a prescription for high dose vitamin D, take 1 capsule once a week for 8 weeks, after finishing 8 weeks, start a  daily calcium  and vitamin D supplement. Thanks ----- Message ----- From: Rebecka Hose Lab Results In Sent: 02/29/2024  11:50 PM EST To: Darice CHRISTELLA Shivers, MD

## 2024-03-05 NOTE — Telephone Encounter (Signed)
 Pt called informed  liver function is normal. His vitamin D level is very low, Dr Georjean sent in a prescription for high dose vitamin D, take 1 capsule once a week for 8 weeks, after finishing 8 weeks, start a daily calcium  and vitamin D supplement

## 2024-03-06 ENCOUNTER — Ambulatory Visit (HOSPITAL_COMMUNITY): Admission: RE | Admit: 2024-03-06 | Source: Ambulatory Visit

## 2024-03-06 ENCOUNTER — Other Ambulatory Visit (HOSPITAL_COMMUNITY): Payer: Self-pay

## 2024-03-16 ENCOUNTER — Other Ambulatory Visit: Payer: Self-pay | Admitting: Cardiology

## 2024-03-16 ENCOUNTER — Ambulatory Visit (HOSPITAL_COMMUNITY)
Admission: RE | Admit: 2024-03-16 | Discharge: 2024-03-16 | Disposition: A | Discharge status: Home / Self Care | Source: Ambulatory Visit | Attending: Cardiology | Admitting: Cardiology

## 2024-03-16 ENCOUNTER — Other Ambulatory Visit (HOSPITAL_COMMUNITY): Payer: Self-pay

## 2024-03-16 DIAGNOSIS — I255 Ischemic cardiomyopathy: Secondary | ICD-10-CM

## 2024-03-16 MED ORDER — GADOBUTROL 1 MMOL/ML IV SOLN
10.0000 mL | Freq: Once | INTRAVENOUS | Status: AC | PRN
  Administered 2024-03-16: 10 mL via INTRAVENOUS

## 2024-03-16 MED ORDER — GADOBUTROL 1 MMOL/ML IV SOLN: 10.0000 mL | Freq: Once | INTRAVENOUS | Status: DC | PRN

## 2024-03-20 ENCOUNTER — Encounter: Payer: Self-pay | Admitting: Neurology

## 2024-03-26 ENCOUNTER — Other Ambulatory Visit (HOSPITAL_COMMUNITY): Payer: Self-pay

## 2024-03-27 ENCOUNTER — Other Ambulatory Visit: Payer: Self-pay

## 2024-04-06 NOTE — Telephone Encounter (Signed)
 Spoke with patient. Relayed Dr. Tyree message. Patient asked what time the appointment was scheduled. All questions answered.

## 2024-04-06 NOTE — Telephone Encounter (Signed)
-----   Message from [facility] sent at 03/31/2024 12:49 AM EST ----- [name]  We will review the results of the cMRI at the next office visit. Please try to bring your daughter to the next visit.  Call us  sooner if questions or concerns arise.  Regards,   Madonna Large, DO, [facility] ----- Message ----- From: Interface, Rad Results In Sent: 03/19/2024   2:28 PM EST To: Madonna Large, DO

## 2024-04-09 ENCOUNTER — Other Ambulatory Visit: Payer: Self-pay | Admitting: Urology

## 2024-04-09 ENCOUNTER — Other Ambulatory Visit (HOSPITAL_COMMUNITY): Payer: Self-pay

## 2024-04-09 MED ORDER — OXYCODONE-ACETAMINOPHEN 5-325 MG PO TABS
1.0000 | ORAL_TABLET | Freq: Every evening | ORAL | 0 refills | Status: DC | PRN
  Filled 2024-04-09: qty 60, 42d supply, fill #0

## 2024-04-16 ENCOUNTER — Ambulatory Visit: Attending: Cardiology | Admitting: Cardiology

## 2024-04-16 ENCOUNTER — Other Ambulatory Visit (HOSPITAL_COMMUNITY): Payer: Self-pay

## 2024-04-16 ENCOUNTER — Encounter: Payer: Self-pay | Admitting: Cardiology

## 2024-04-16 VITALS — BP 112/82 | HR 94 | Resp 16 | Ht 71.0 in | Wt 171.6 lb

## 2024-04-16 DIAGNOSIS — I2510 Atherosclerotic heart disease of native coronary artery without angina pectoris: Secondary | ICD-10-CM

## 2024-04-16 DIAGNOSIS — I255 Ischemic cardiomyopathy: Secondary | ICD-10-CM

## 2024-04-16 DIAGNOSIS — Z95820 Peripheral vascular angioplasty status with implants and grafts: Secondary | ICD-10-CM | POA: Diagnosis not present

## 2024-04-16 DIAGNOSIS — E1159 Type 2 diabetes mellitus with other circulatory complications: Secondary | ICD-10-CM

## 2024-04-16 DIAGNOSIS — I4729 Other ventricular tachycardia: Secondary | ICD-10-CM

## 2024-04-16 DIAGNOSIS — E782 Mixed hyperlipidemia: Secondary | ICD-10-CM | POA: Diagnosis not present

## 2024-04-16 DIAGNOSIS — I252 Old myocardial infarction: Secondary | ICD-10-CM

## 2024-04-16 DIAGNOSIS — F17210 Nicotine dependence, cigarettes, uncomplicated: Secondary | ICD-10-CM

## 2024-04-16 DIAGNOSIS — Z720 Tobacco use: Secondary | ICD-10-CM

## 2024-04-16 MED ORDER — NICOTINE 14 MG/24HR TD PT24
14.0000 mg | MEDICATED_PATCH | Freq: Every day | TRANSDERMAL | 0 refills | Status: DC
  Filled 2024-04-16: qty 28, 28d supply, fill #0

## 2024-04-16 MED ORDER — NICOTINE POLACRILEX 2 MG MT LOZG
2.0000 mg | LOZENGE | OROMUCOSAL | 0 refills | Status: DC | PRN
  Filled 2024-04-16: qty 108, fill #0

## 2024-04-16 MED ORDER — NICOTINE POLACRILEX 2 MG MT LOZG
2.0000 mg | LOZENGE | OROMUCOSAL | 0 refills | Status: AC | PRN
  Filled 2024-04-16: qty 100, 10d supply, fill #0

## 2024-04-16 NOTE — Patient Instructions (Signed)
 Medication Instructions:  START taking Nicotine  patches (Nicoderm CQ ) 14 mg. Place according to instructions in insert.   START taking Nicotine  lozenges (Nicorette ) 2 mg. Use as directed.  *If you need a refill on your cardiac medications before your next appointment, please call your pharmacy*  Lab Work: None ordered If you have labs (blood work) drawn today and your tests are completely normal, you will receive your results only by: MyChart Message (if you have MyChart) OR A paper copy in the mail If you have any lab test that is abnormal or we need to change your treatment, we will call you to review the results.  Testing/Procedures: None ordered  Follow-Up: At [facility], you and your health needs are our priority.  As part of our continuing mission to provide you with exceptional heart care, our providers are all part of one team.  This team includes your primary Cardiologist (physician) and Advanced Practice Providers or APPs (Physician Assistants and Nurse Practitioners) who all work together to provide you with the care you need, when you need it.  Your next appointment:   6 month(s)  Provider:   Madonna Large, DO    We recommend signing up for the patient portal called MyChart.  Sign up information is provided on this After Visit Summary.  MyChart is used to connect with patients for Virtual Visits (Telemedicine).  Patients are able to view lab/test results, encounter notes, upcoming appointments, etc.  Non-urgent messages can be sent to your provider as well.   To learn more about what you can do with MyChart, go to [email or web address].   Other Instructions We are referring you to Electrophysiology, Dr. Almetta, for evaluation for an ICD.

## 2024-04-16 NOTE — Progress Notes (Signed)
 Cardiology Office Note:  .   ID:  Jonathan Barker, DOB Dec 13, 1965, MRN [id number] PCP:  Leontine Lapine, MD  Former Cardiology Providers: Dr. Gordy Bergamo  [facility] Providers Cardiologist:  Madonna Large, DO , [facility] (established care March 2021) Electrophysiologist:  None  Click to update primary MD,subspecialty MD or APP then REFRESH:1}    Chief Complaint  Patient presents with   Coronary artery disease involving native coronary artery of   Follow-up    History of Present Illness: .   Jonathan Barker is a 58 y.o. Caucasian male whose past medical history and cardiovascular risk factors includes: Hx of non-STEMI, established coronary artery disease with PCI, ischemic cardiomyopathy, aortic root dilatation (41 mm at the sinuses of Valsalva November 2025), hypertension, hyperlipidemia, Aortic atherosclerosis, non- insulin  diabetes mellitus type 2, history of nonsustained ventricular tachycardia, active tobacco use, history of astrocytoma.   Patient is accompanied by his daughter at today's office visit.  In March 2021 patient presented to the hospital with chest pain and ruled in for NSTEMI.  Angiography noted obstructive disease and successfully underwent intervention to the LAD as noted below.  In the recent past patient's LVEF has reduced but has not been experiencing anginal chest pain or heart failure symptoms.  Cardiac PET/CT in March 2025 which reported no evidence of ischemia but findings consistent with prior infarction.  Patient is currently on Jardiance , Lopressor , Entresto , Ozempic , aspirin  and statin therapy.  Aldactone  was added to further optimize his GDMT.  Echocardiogram in July 2025 which noted LVEF of 35%, grade 1 diastolic dysfunction, no significant valvular heart disease.    At last office visit shared decision was to continue uptitration of GDMT and to proceed forward with a cardiac MRI to further evaluate his LVEF.  Cardiac MRI notes LVEF of 30%.  He presents  today for follow-up.  Patient denies anginal chest pain or heart failure symptoms. Overall functional capacity remains relatively stable. Patient states that he spoken to his family and he would like to proceed forward with AICD evaluation. Cardiac MRI results reviewed with the patient and daughter at today's office visit. He usually gets an MRI once a year given his history of astrocytoma which has remained relatively stable.. Patient states that he is also being worked up for pulmonary nodules and an upcoming CT scan. Unfortunately he continues to smoke at least 10 cigarettes/day but is encouraged to stop.  Review of Systems: .   Review of Systems  Cardiovascular:  Negative for chest pain, claudication, irregular heartbeat, leg swelling, near-syncope, orthopnea, palpitations, paroxysmal nocturnal dyspnea and syncope.  Respiratory:  Negative for shortness of breath.   Hematologic/Lymphatic: Negative for bleeding problem.   Studies Reviewed:    Echocardiogram: 07/15/2019: LVEF 30-35%, see report for additional details  08/26/2020:  Left ventricle cavity is normal in size. Mild concentric hypertrophy of the left ventricle. Mid to distal anteroseptal and apical severe hypokinesis. LVEF 40-45%. Grade 1 diastolic dysfunction, normal LAP.  Mild (Grade I) mitral regurgitation.  No evidence of pulmonary hypertension.   January 2025 LVEF: 25-30%, global hypokinesis Diastolic Function: Grade 1 Right ventricular function and size normal Aorta: Aortic root 40 mm Estimated RAP 3 mmHg   July 2025:  1. Left ventricular ejection fraction, by estimation, is 35%. The left ventricle has moderately decreased function. Left ventricular diastolic parameters are consistent with Grade I diastolic dysfunction (impaired  relaxation).   2. Right ventricular systolic function is normal. The right ventricular size is normal.   3.  The mitral valve is normal in structure. Mild mitral valve regurgitation.    4. The aortic valve is tricuspid. Aortic valve regurgitation is not visualized. Aortic valve sclerosis/calcification is present, without any evidence of aortic stenosis.   Left Heart Catheterization 07/15/19:  LV: Mid to distal anterior, anterolateral and apical akinesis, EF 35 to at most 40%.  Hand contrast injection, not well visualized.  Normal LVEDP. RCA: Dominant, mild diffuse disease. Left main: Mildly calcified. LAD: Diffusely diseased proximal segment, mid segment had a 90% stenosis.  Mid to distal segment has minimal disease and apical LAD had 99% stenosis.  Successful PTCA and stenting of the proximal/ostial and mid LAD with implantation of a long 3.0 x 38 mm resolute Onyx DES postdilated with 3.5 x 12 mm noncompliant sapphire balloon.  90% stenosis reduced to 0% with TIMI-3 to TIMI-3 flow.  Apical LAD stenosis left alone. Ramus intermediate: Large vessel, ostium has ulcerated appearing 99% stenosis.  Upon angioplasty realized it was also calcified and probably chronic.  Successful PTCA and stenting with 2 overlapping 2.5 x 15 and distally 2.5 x 13 mm resolute Onyx DES.  9 9% stenosis reduced to 0% with TIMI-3 to TIMI-3 flow. Circumflex: Moderate to large vessel, proximal to mid segment is diffusely diseased 30%.  Large OM1 with proximal long 60% to at most 70% stenosis.  Cardiac PET/CT 07/20/2023   Findings are consistent with infarction. The study is high risk due to infarct burden.   LV perfusion is abnormal. There is no evidence of ischemia. There is evidence of infarction. Defect 1: There is a large defect with absent uptake present in the apical to basal anterior location(s) that is fixed. There is abnormal wall motion in the defect area. Consistent with infarction.   Rest left ventricular function is abnormal. Rest global function is moderately reduced. Rest EF: 30%. Stress left ventricular function is abnormal. Stress global function is mildly reduced. Stress EF: 40%. End diastolic  cavity size is moderately enlarged. End systolic cavity size is mildly enlarged.   Myocardial blood flow was computed to be 0.7ml/g/min at rest and 2.75ml/g/min at stress. Global myocardial blood flow reserve was 2.55 and was normal.   Coronary calcium  assessment not performed due to prior revascularization. Aortic Atherosclerosis. Radiology: 1. Emphysema and mild diffuse bilateral bronchial wall thickening. 2. Coronary artery disease.   Aortic Atherosclerosis (ICD10-I70.0).  cMRI 03/16/2024 1. Ischemic cardiomyopathy with left ventricular mid-apical anterior and anterolateral wall transmural infarct, without viability. LVEF 30%. Normal left ventricular chamber size.   2. Normal right ventricular chamber size with mildly reduced RV systolic function, RVEF 42%. No delayed myocardial enhancement.   3.  Mild aortic root dilation, 41 mm sinus of Valsalva.  RADIOLOGY: NA  Risk Assessment/Calculations:   N/A   Labs:       Latest Ref Rng & Units 02/16/2024    9:43 AM 07/16/2019    2:28 AM 07/15/2019    3:10 AM  CBC  WBC 4.0 - 10.5 K/uL  21.8  20.2   Hemoglobin 13.0 - 17.7 g/dL 83.6  85.4  82.8   Hematocrit 37.5 - 51.0 % 49.2  42.4  49.7   Platelets 150 - 400 K/uL  299  330        Latest Ref Rng & Units 08/31/2023    1:11 PM 11/10/2022   10:13 AM 01/20/2022   10:25 AM  BMP  Glucose 70 - 99 mg/dL 839  83  841   BUN 6 - 24 mg/dL 17  18  9   Creatinine 0.76 - 1.27 mg/dL 9.11  9.09  9.26   BUN/Creat Ratio 9 - 20 19  20  12    Sodium 134 - 144 mmol/L 139  143  139   Potassium 3.5 - 5.2 mmol/L 4.2  4.2  3.9   Chloride 96 - 106 mmol/L 103  102  104   CO2 20 - 29 mmol/L 22  21  22    Calcium  8.7 - 10.2 mg/dL 9.4  9.9  9.5       Latest Ref Rng & Units 02/29/2024    9:17 AM 08/31/2023    1:11 PM 11/10/2022   10:13 AM  CMP  Glucose 70 - 99 mg/dL  839  83   BUN 6 - 24 mg/dL  17  18   Creatinine 9.23 - 1.27 mg/dL  9.11  9.09   Sodium 865 - 144 mmol/L  139  143   Potassium 3.5 -  5.2 mmol/L  4.2  4.2   Chloride 96 - 106 mmol/L  103  102   CO2 20 - 29 mmol/L  22  21   Calcium  8.7 - 10.2 mg/dL  9.4  9.9   Total Protein 6.1 - 8.1 g/dL 6.7   7.3   Total Bilirubin 0.2 - 1.2 mg/dL 0.5   0.2   Alkaline Phos 44 - 121 IU/L   104   AST 10 - 35 U/L 15   25   ALT 9 - 46 U/L 14   37     Lab Results  Component Value Date   CHOL 104 11/10/2022   HDL 35 (L) 11/10/2022   LDLCALC 51 11/10/2022   TRIG 91 11/10/2022   CHOLHDL 3.0 11/10/2022   No results for input(s): LIPOA in the last 8760 hours. No components found for: NTPROBNP No results for input(s): PROBNP in the last 8760 hours. No results for input(s): TSH in the last 8760 hours.    Physical Exam:    Today's Vitals   04/16/24 0824  BP: 112/82  Pulse: 94  Resp: 16  SpO2: 96%  Weight: 171 lb 9.6 oz (77.8 kg)  Height: 5' 11 (1.803 m)    Body mass index is 23.93 kg/m. Wt Readings from Last 3 Encounters:  04/16/24 171 lb 9.6 oz (77.8 kg)  02/29/24 170 lb 9.6 oz (77.4 kg)  02/16/24 172 lb 6.4 oz (78.2 kg)    Physical Exam  Constitutional: No distress.  Age appropriate, hemodynamically stable.   Neck: No JVD present.  Cardiovascular: Normal rate, regular rhythm, S1 normal, S2 normal, intact distal pulses and normal pulses. Exam reveals no gallop, no S3 and no S4.  Murmur heard. Holosystolic murmur is present with a grade of 3/6 at the apex. Pulmonary/Chest: No stridor. He has no wheezes. He has no rales.  Decreased breath sounds bilaterally, poor inspiratory effort  Abdominal: Soft. Bowel sounds are normal. He exhibits no distension. There is no abdominal tenderness.  Musculoskeletal:        General: No edema.     Cervical back: Neck supple.  Neurological: He is alert and oriented to person, place, and time. He has intact cranial nerves (2-12).  Skin: Skin is warm and moist.     Impression & Recommendation(s):  Impression:   ICD-10-CM   1. Coronary artery disease involving native  coronary artery of native heart without angina pectoris  I25.10     2. Hx of non-ST elevation myocardial infarction (NSTEMI)  I25.2  3. S/P angioplasty with stent  Z95.820     4. Ischemic cardiomyopathy  I25.5     5. Nonsustained ventricular tachycardia (HCC)  I47.29     6. Type 2 diabetes mellitus with other circulatory complication, without long-term current use of insulin  (HCC)  E11.59     7. Mixed hyperlipidemia  E78.2     8. Continuous dependence on cigarette smoking  F17.210     9. Tobacco use  Z72.0       Recommendation(s):  Coronary artery disease involving native coronary artery of native heart without angina pectoris Hx of non-ST elevation myocardial infarction (NSTEMI) S/P angioplasty with stent Ischemic cardiomyopathy March 2021: LVEF 30-35% April 2022: LVEF 40-45% January 2025: LVEF 25-30% July 2025: LVEF 35% November 2025: LVEF 30%, cardiac MRI NYHA class II Overall euvolemic on physical examination. Continue Entresto  24/26 mg p.o. twice daily. Continue spironolactone  25 mg p.o. every afternoon. Continue Lopressor  50 mg p.o. twice daily Continues Jardiance  10 mg p.o. every morning Unable to uptitrate GDMT due to soft blood pressures as mentioned above. Cardiac MRI was ordered to reevaluate LVEF, which is 30%. Patient wants to be evaluated for an ICD implant for primary prevention. Two factors to consider are the need for yearly MRIs given his history of astrocytoma and recently being worked up for pulmonary nodule given his history of smoking.  I will still place a consult in for EP for further guidance and evaluation.  Nonsustained ventricular tachycardia (HCC) History of. Clinically asymptomatic. Currently on metoprolol  to tartrate given the ease of titration and holding a dose if needed given his history of hypotension.  Type 2 diabetes mellitus with other circulatory complication, without long-term current use of insulin  (HCC) Reemphasize importance  of glycemic control. Currently on ARNI, Jardiance , Ozempic , Most recent A1c 6.10 August 2023  Mixed hyperlipidemia Continue Lipitor  80 mg p.o. nightly  Tobacco use Tobacco cessation counseling: Currently smoking 10 cigarettes/day Patient denies claudication He is informed of the dangers of tobacco abuse including stroke, cancer, and MI, as well as benefits of tobacco cessation. He is willing to quit at this time. 7 mins were spent counseling patient cessation techniques. We discussed various methods to help quit smoking, including deciding on a date to quit, joining a support group, pharmacological agents- nicotine  gum/patch/lozenges.  Will start nicotine  patches as well as nicotine  lozenges. Patient states that he does not want to be on Chantix  given the risk of seizures.  This is quite reasonable given his history of astrocytoma. I will reassess his progress at the next follow-up visit  Orders Placed:  No orders of the defined types were placed in this encounter.   Final Medication List:    No orders of the defined types were placed in this encounter.   There are no discontinued medications.     Current Outpatient Medications:    aspirin  81 MG chewable tablet, Chew 1 tablet (81 mg total) by mouth daily., Disp: 90 tablet, Rfl: 3   atorvastatin  (LIPITOR ) 80 MG tablet, TAKE 1 TABLET BY MOUTH DAILY AT 6 PM., Disp: 90 tablet, Rfl: 3   blood glucose meter kit and supplies KIT, Dispense based on patient and insurance preference. Use up to four times daily as directed. (FOR ICD-9 250.00, 250.01)., Disp: 1 each, Rfl: 0   Blood Pressure Monitoring (BLOOD PRESSURE CUFF) MISC, 1 each by Does not apply route daily as needed., Disp: 1 each, Rfl: 0   ciclopirox (PENLAC) 8 % solution, 1 application Externally Once a day  for 90 days, Disp: , Rfl:    empagliflozin  (JARDIANCE ) 10 MG TABS tablet, Take 1 tablet (10 mg total) by mouth daily before breakfast., Disp: 30 tablet, Rfl: 3   gabapentin   (NEURONTIN ) 600 MG tablet, Take 1 tablet (600 mg total) by mouth 2 (two) times daily., Disp: 180 tablet, Rfl: 1   Insulin  Pen Needle (PEN NEEDLES) 32G X 4 MM MISC, 1 pen by Does not apply route as directed., Disp: 100 each, Rfl: 11   metoprolol  tartrate (LOPRESSOR ) 50 MG tablet, Take 1 tablet (50 mg total) by mouth 2 (two) times daily.HOLD IF SYSTOLIC BP (TOP BLOOD PRESSURE NUMBER) LESS THAN 100 MMHG OR HEART RATE LESS THAN 60 BPM (PULSE)., Disp: 180 tablet, Rfl: 3   mupirocin  ointment (BACTROBAN ) 2 %, Apply topically as directed daily., Disp: 22 g, Rfl: 0   nicotine  polacrilex (NICORETTE ) 4 MG gum, Take 1 (4 mg total) by mouth as needed for smoking cessation., Disp: 100 tablet, Rfl: 0   nitroGLYCERIN  (NITROSTAT ) 0.4 MG SL tablet, Place 1 tablet (0.4 mg total) under the tongue every 5 (five) minutes x 3 doses as needed for chest pain., Disp: 90 tablet, Rfl: 3   NON FORMULARY, Take 1 tablet by mouth daily. OTC Nerve Vive, Disp: , Rfl:    oxyCODONE -acetaminophen  (PERCOCET/ROXICET) 5-325 MG tablet, Take 1 tablet by mouth at bedtime as needed for severe pain (pain score 7-10) (may repeat one additional tablet if needed for severe pain only- Rx will only be refilled every 6 weeks)., Disp: 60 tablet, Rfl: 0   phenytoin  (DILANTIN ) 100 MG ER capsule, Take 2 capsules (200 mg total) by mouth in the morning., Disp: 180 capsule, Rfl: 4   sacubitril -valsartan  (ENTRESTO ) 24-26 MG, Take 1 tablet by mouth 2 (two) times daily., Disp: 180 tablet, Rfl: 3   Semaglutide , 1 MG/DOSE, (OZEMPIC , 1 MG/DOSE,) 4 MG/3ML SOPN, Inject 1 mg into the skin once a week., Disp: 3 mL, Rfl: 3   spironolactone  (ALDACTONE ) 25 MG tablet, Take 1 tablet (25 mg total) by mouth daily., Disp: 90 tablet, Rfl: 3   Vitamin D , Ergocalciferol , (DRISDOL ) 1.25 MG (50000 UNIT) CAPS capsule, Take 1 capsule (50,000 Units total) by mouth once a week for 8 weeks., Disp: 8 capsule, Rfl: 0  Consent:   NA  Disposition:   57-month follow-up sooner if  needed  His questions and concerns were addressed to his satisfaction. He voices understanding of the recommendations provided during this encounter.    Signed, Madonna Michele HAS, [facility]  A Division of [facility] [address] [place], [place] [address]  8:38 AM 04/16/2024

## 2024-04-18 ENCOUNTER — Ambulatory Visit (HOSPITAL_COMMUNITY)
Admission: RE | Admit: 2024-04-18 | Discharge: 2024-04-18 | Disposition: A | Discharge status: Home / Self Care | Source: Ambulatory Visit | Attending: Family Medicine | Admitting: Family Medicine

## 2024-04-18 DIAGNOSIS — R918 Other nonspecific abnormal finding of lung field: Secondary | ICD-10-CM | POA: Diagnosis not present

## 2024-04-18 DIAGNOSIS — J439 Emphysema, unspecified: Secondary | ICD-10-CM | POA: Diagnosis not present

## 2024-04-18 DIAGNOSIS — Z720 Tobacco use: Secondary | ICD-10-CM | POA: Diagnosis present

## 2024-04-18 DIAGNOSIS — I2510 Atherosclerotic heart disease of native coronary artery without angina pectoris: Secondary | ICD-10-CM | POA: Insufficient documentation

## 2024-04-18 DIAGNOSIS — I700 Atherosclerosis of aorta: Secondary | ICD-10-CM | POA: Insufficient documentation

## 2024-04-18 DIAGNOSIS — F17210 Nicotine dependence, cigarettes, uncomplicated: Secondary | ICD-10-CM | POA: Insufficient documentation

## 2024-04-23 ENCOUNTER — Telehealth: Payer: Self-pay

## 2024-04-23 NOTE — Telephone Encounter (Signed)
 Unable to find any telephone messages related to the patient receiving a phone call from our office this am.  Forwarding message to our triage to have her look further.   Copied from CRM [id number]. Topic: General - Call Back - No Documentation >> Apr 23, 2024 11:43 AM Mercer PEDLAR wrote: Reason for CRM: Patient stated he received a call this morning 04/23/24.

## 2024-04-23 NOTE — Telephone Encounter (Signed)
 Unsure whp called pt - I will ask Dr Nguyen  if she had called the pt.

## 2024-04-24 ENCOUNTER — Other Ambulatory Visit (HOSPITAL_COMMUNITY): Payer: Self-pay

## 2024-04-24 ENCOUNTER — Other Ambulatory Visit: Payer: Self-pay

## 2024-04-24 DIAGNOSIS — E1159 Type 2 diabetes mellitus with other circulatory complications: Secondary | ICD-10-CM

## 2024-04-24 MED ORDER — OZEMPIC (1 MG/DOSE) 4 MG/3ML ~~LOC~~ SOPN
1.0000 mg | PEN_INJECTOR | SUBCUTANEOUS | 3 refills | Status: DC
  Filled 2024-04-24: qty 3, 28d supply, fill #0

## 2024-04-25 ENCOUNTER — Other Ambulatory Visit (HOSPITAL_COMMUNITY): Payer: Self-pay

## 2024-04-25 ENCOUNTER — Telehealth: Payer: Self-pay | Admitting: *Deleted

## 2024-04-25 DIAGNOSIS — C711 Malignant neoplasm of frontal lobe: Secondary | ICD-10-CM

## 2024-04-25 NOTE — Telephone Encounter (Signed)
 PC to patient, informed him he has MRI brain ordered which needs to be scheduled before his appointment with Dr Buckley on 05/22/24.  MRI reordered for [facility] per patient request.

## 2024-04-30 ENCOUNTER — Other Ambulatory Visit (HOSPITAL_COMMUNITY): Payer: Self-pay

## 2024-05-07 ENCOUNTER — Other Ambulatory Visit (HOSPITAL_COMMUNITY): Payer: Self-pay

## 2024-05-18 ENCOUNTER — Ambulatory Visit: Payer: Self-pay | Admitting: Student

## 2024-05-18 ENCOUNTER — Other Ambulatory Visit (HOSPITAL_COMMUNITY): Payer: Self-pay

## 2024-05-18 ENCOUNTER — Ambulatory Visit: Payer: Self-pay

## 2024-05-18 ENCOUNTER — Encounter: Payer: Self-pay | Admitting: Student

## 2024-05-18 VITALS — BP 122/86 | HR 89 | Temp 97.5°F | Ht 71.0 in | Wt 177.6 lb

## 2024-05-18 DIAGNOSIS — F17210 Nicotine dependence, cigarettes, uncomplicated: Secondary | ICD-10-CM | POA: Diagnosis not present

## 2024-05-18 DIAGNOSIS — E119 Type 2 diabetes mellitus without complications: Secondary | ICD-10-CM | POA: Diagnosis not present

## 2024-05-18 DIAGNOSIS — Z7984 Long term (current) use of oral hypoglycemic drugs: Secondary | ICD-10-CM | POA: Diagnosis not present

## 2024-05-18 DIAGNOSIS — E785 Hyperlipidemia, unspecified: Secondary | ICD-10-CM

## 2024-05-18 DIAGNOSIS — E782 Mixed hyperlipidemia: Secondary | ICD-10-CM

## 2024-05-18 DIAGNOSIS — Z7985 Long-term (current) use of injectable non-insulin antidiabetic drugs: Secondary | ICD-10-CM

## 2024-05-18 DIAGNOSIS — I5022 Chronic systolic (congestive) heart failure: Secondary | ICD-10-CM

## 2024-05-18 DIAGNOSIS — E559 Vitamin D deficiency, unspecified: Secondary | ICD-10-CM | POA: Diagnosis not present

## 2024-05-18 DIAGNOSIS — J439 Emphysema, unspecified: Secondary | ICD-10-CM | POA: Diagnosis not present

## 2024-05-18 DIAGNOSIS — Z720 Tobacco use: Secondary | ICD-10-CM

## 2024-05-18 DIAGNOSIS — E1159 Type 2 diabetes mellitus with other circulatory complications: Secondary | ICD-10-CM

## 2024-05-18 LAB — POCT GLYCOSYLATED HEMOGLOBIN (HGB A1C): HbA1c, POC (controlled diabetic range): 6.3 % (ref 0.0–7.0)

## 2024-05-18 LAB — GLUCOSE, CAPILLARY: Glucose-Capillary: 167 mg/dL — ABNORMAL HIGH (ref 70–99)

## 2024-05-18 MED ORDER — OZEMPIC (1 MG/DOSE) 4 MG/3ML ~~LOC~~ SOPN
1.0000 mg | PEN_INJECTOR | SUBCUTANEOUS | 3 refills | Status: AC
  Filled 2024-05-18: qty 3, 28d supply, fill #0

## 2024-05-18 MED ORDER — SPIRIVA RESPIMAT 2.5 MCG/ACT IN AERS
2.0000 | INHALATION_SPRAY | Freq: Every day | RESPIRATORY_TRACT | 3 refills | Status: DC
  Filled 2024-05-18: qty 4, 30d supply, fill #0

## 2024-05-18 MED ORDER — TIOTROPIUM BROMIDE-OLODATEROL 2.5-2.5 MCG/ACT IN AERS
2.0000 | INHALATION_SPRAY | Freq: Every day | RESPIRATORY_TRACT | 3 refills | Status: DC
  Filled 2024-05-18: qty 4, 30d supply, fill #0

## 2024-05-18 MED ORDER — UMECLIDINIUM BROMIDE 62.5 MCG/ACT IN AEPB
1.0000 | INHALATION_SPRAY | Freq: Every day | RESPIRATORY_TRACT | 3 refills | Status: AC
  Filled 2024-05-18: qty 30, 30d supply, fill #0

## 2024-05-18 MED ORDER — ALBUTEROL SULFATE HFA 108 (90 BASE) MCG/ACT IN AERS
2.0000 | INHALATION_SPRAY | Freq: Four times a day (QID) | RESPIRATORY_TRACT | 2 refills | Status: AC | PRN
  Filled 2024-05-18: qty 6.7, 30d supply, fill #0

## 2024-05-18 MED ORDER — ALBUTEROL SULFATE 108 (90 BASE) MCG/ACT IN AEPB
1.0000 | INHALATION_SPRAY | RESPIRATORY_TRACT | 3 refills | Status: DC | PRN
  Filled 2024-05-18: qty 1, 17d supply, fill #0

## 2024-05-18 NOTE — Assessment & Plan Note (Signed)
 Last LDL was 51 in July 2024. On Lipitor  80 mg daily.  Plan: -Recheck lipid profile today -Continue Lipitor  80 mg daily

## 2024-05-18 NOTE — Progress Notes (Signed)
 "  Established Patient Office Visit  Subjective   Patient ID: Jonathan Barker, male    DOB: [birth year]  Age: 59 y.o. MRN: [id number]  Chief Complaint  Patient presents with   Diabetes    Jonathan Barker is a 59 y.o. who presents to the clinic for a three month follow up of T2DM. Please see problem based assessment and plan for additional details.   Patient Active Problem List   Diagnosis Date Noted   Emphysema lung (HCC) 05/18/2024   Chronic HFrEF (heart failure with reduced ejection fraction) (HCC) 01/20/2022   Claudication 09/24/2020   Screening for colon cancer 09/24/2020   CAD (coronary artery disease), native coronary artery 07/15/2019   History of non-ST elevation myocardial infarction (NSTEMI) 07/15/2019   S/P angioplasty with stent    Peripheral neuropathy 06/20/2019   Chronic headaches 06/11/2016   Genetic testing 06/30/2015   Localization-related symptomatic epilepsy and epileptic syndromes with simple partial seizures, not intractable, without status epilepticus (HCC) 04/15/2015   Healthcare maintenance 02/10/2015   Astrocytoma of frontal lobe (HCC) 05/31/2013   Tobacco use 09/09/2011   Type II diabetes mellitus (HCC) 08/09/2011   HLD (hyperlipidemia) 08/09/2011      Objective:     BP 122/86 (BP Location: Left Arm, Patient Position: Sitting, Cuff Size: Small)   Pulse 89   Temp (!) 97.5 F (36.4 C) (Oral)   Ht 5' 11 (1.803 m)   Wt 177 lb 9.6 oz (80.6 kg)   SpO2 100%   BMI 24.77 kg/m  BP Readings from Last 3 Encounters:  05/18/24 122/86  04/16/24 112/82  02/29/24 (!) 109/55   Wt Readings from Last 3 Encounters:  05/18/24 177 lb 9.6 oz (80.6 kg)  04/16/24 171 lb 9.6 oz (77.8 kg)  02/29/24 170 lb 9.6 oz (77.4 kg)      Physical Exam Vitals reviewed.  Cardiovascular:     Rate and Rhythm: Normal rate and regular rhythm.  Pulmonary:     Effort: Pulmonary effort is normal.     Breath sounds: Wheezing present.  Musculoskeletal:     Right lower  leg: No edema.     Left lower leg: No edema.  Skin:    General: Skin is warm and dry.  Neurological:     Mental Status: He is alert.  Psychiatric:        Mood and Affect: Mood and affect normal.      Results for orders placed or performed in visit on 05/18/24  Glucose, capillary  Result Value Ref Range   Glucose-Capillary 167 (H) 70 - 99 mg/dL  POC Hbg J8R  Result Value Ref Range   Hemoglobin A1C     HbA1c POC (<> result, manual entry)     HbA1c, POC (prediabetic range)     HbA1c, POC (controlled diabetic range) 6.3 0.0 - 7.0 %    Last metabolic panel Lab Results  Component Value Date   GLUCOSE 160 (H) 08/31/2023   NA 139 08/31/2023   K 4.2 08/31/2023   CL 103 08/31/2023   CO2 22 08/31/2023   BUN 17 08/31/2023   CREATININE 0.88 08/31/2023   EGFR 101 08/31/2023   CALCIUM  9.4 08/31/2023   PROT 6.7 02/29/2024   ALBUMIN 4.7 11/10/2022   LABGLOB 2.6 11/10/2022   BILITOT 0.5 02/29/2024   ALKPHOS 104 11/10/2022   AST 15 02/29/2024   ALT 14 02/29/2024   ANIONGAP 11 07/16/2019   Last lipids Lab Results  Component Value Date   CHOL  104 11/10/2022   HDL 35 (L) 11/10/2022   LDLCALC 51 11/10/2022   TRIG 91 11/10/2022   CHOLHDL 3.0 11/10/2022   Last hemoglobin A1c Lab Results  Component Value Date   HGBA1C 6.3 05/18/2024      The ASCVD Risk score (Arnett DK, et al., 2019) failed to calculate for the following reasons:   Risk score cannot be calculated because patient has a medical history suggesting prior/existing ASCVD   * - Cholesterol units were assumed    Assessment & Plan:   Problem List Items Addressed This Visit       Cardiovascular and Mediastinum   Chronic HFrEF (heart failure with reduced ejection fraction) (HCC)   LVEF 30% on cardiac MRI in November 2025. Follows with cardiology and plans on placement of ICD in near future. Euvolemic on exam today Plan: -Continue Jardiance  10 mg daily, Entresto  24-26 mg BID, Aldactone  25 mg daily -On Lopressor   50 mg BID per cards        Respiratory   Emphysema lung (HCC)   Patient has emphysema on imaging. He does not have a documented diagnosis of COPD or PFTs on file. He reports cough when it is cold outside but denies shortness of breath, wheezing, or daily cough. On exam, he has expiratory wheezes. Patient declined spirometry here.  Plan: -PFT orders placed -Start LAMA inhaler -Start albuterol  prn       Relevant Medications   albuterol  (VENTOLIN  HFA) 108 (90 Base) MCG/ACT inhaler   Tiotropium Bromide -Olodaterol 2.5-2.5 MCG/ACT AERS   Other Relevant Orders   Pulmonary function test     Endocrine   Type II diabetes mellitus (HCC) - Primary (Chronic)   Patient presents with a history of T2DM with a prior A1c of 6.5 in October 2025.  Patient's A1c today is 6.3.  They are on a regimen of Jardiance  10 mg and Ozempic  1mg . Patient denies adverse effects of GLP-1.  Plan: -Continue regimen of:Jardiance  10 mg and Ozempic  1mg  -A1c today -Ophthalmology referral: Placed -Foot exam: patient declined -Urine ACR, patient declined      Relevant Medications   Semaglutide , 1 MG/DOSE, (OZEMPIC , 1 MG/DOSE,) 4 MG/3ML SOPN   Other Relevant Orders   POC Hbg A1C (Completed)   Ambulatory referral to Ophthalmology   Basic metabolic panel with GFR     Other   HLD (hyperlipidemia) (Chronic)   Last LDL was 51 in July 2024. On Lipitor  80 mg daily.  Plan: -Recheck lipid profile today -Continue Lipitor  80 mg daily      Relevant Orders   Lipid Profile   Tobacco use   Patient declined interest in both nicotine  patches and chantix . He is using nicotine  lozenges and starbursts which are helping him. He is smoking 1/2 pack per day.   Plan: -Will continue to encourage tobacco cessation       Relevant Orders   Pulmonary function test   Other Visit Diagnoses       Vitamin D  deficiency       Relevant Orders   Vitamin D  (25 hydroxy)       Return in about 3 months (around 08/16/2024) for Chronic  conditions .    Damien Lease, DO  "

## 2024-05-18 NOTE — Assessment & Plan Note (Signed)
 Patient declined interest in both nicotine  patches and chantix . He is using nicotine  lozenges and starbursts which are helping him. He is smoking 1/2 pack per day.   Plan: -Will continue to encourage tobacco cessation

## 2024-05-18 NOTE — Patient Instructions (Signed)
 Thank you, [name] for allowing us  to provide your care today. Today we discussed diabetes, heart failure, smoking, and the wheezing you have on exam.  You likely have COPD, for that: Please use 2 puffs of Spiriva  daily along with albuterol  inhaler as needed for shortness of breath or wheezing. I have ordered lung function tests to see how your lungs are functioning. Continue to try to stop smoking!   I have ordered the following labs for you:   Lab Orders         Microalbumin / Creatinine Urine Ratio         Glucose, capillary         Basic metabolic panel with GFR         Lipid Profile         Vitamin D  (25 hydroxy)         POC Hbg A1C       Referrals ordered today:    Referral Orders         Ambulatory referral to Ophthalmology      I have ordered the following medication/changed the following medications:   Stop the following medications: Medications Discontinued During This Encounter  Medication Reason   Insulin  Pen Needle (PEN NEEDLES) 32G X 4 MM MISC    nicotine  (NICODERM CQ ) 14 mg/24hr patch    blood glucose meter kit and supplies KIT    Semaglutide , 1 MG/DOSE, (OZEMPIC , 1 MG/DOSE,) 4 MG/3ML SOPN Reorder     Start the following medications: Meds ordered this encounter  Medications   Semaglutide , 1 MG/DOSE, (OZEMPIC , 1 MG/DOSE,) 4 MG/3ML SOPN    Sig: Inject 1 mg into the skin once a week.    Dispense:  3 mL    Refill:  3   Tiotropium Bromide  (SPIRIVA  RESPIMAT) 2.5 MCG/ACT AERS    Sig: Inhale 2 puffs into the lungs daily.    Dispense:  4 g    Refill:  3   Albuterol  Sulfate (PROAIR  RESPICLICK) 108 (90 Base) MCG/ACT AEPB    Sig: Inhale 1 puff into the lungs every 4 (four) hours as needed (for shortness of breath or wheezing).    Dispense:  1 each    Refill:  3     Follow up: 3 months    Remember:   Should you have any questions or concerns please call the internal medicine clinic at [phone].     Please note that our late policy has  changed.  If you are more than 15 minutes late to your appointment, you may be asked to reschedule your appointment.  Dr. Kandis, D.O. [facility]

## 2024-05-18 NOTE — Assessment & Plan Note (Signed)
 Patient has emphysema on imaging. He does not have a documented diagnosis of COPD or PFTs on file. He reports cough when it is cold outside but denies shortness of breath, wheezing, or daily cough. On exam, he has expiratory wheezes. Patient declined spirometry here.  Plan: -PFT orders placed -Start LAMA inhaler -Start albuterol  prn

## 2024-05-18 NOTE — Assessment & Plan Note (Signed)
 LVEF 30% on cardiac MRI in November 2025. Follows with cardiology and plans on placement of ICD in near future. Euvolemic on exam today Plan: -Continue Jardiance  10 mg daily, Entresto  24-26 mg BID, Aldactone  25 mg daily -On Lopressor  50 mg BID per cards

## 2024-05-18 NOTE — Assessment & Plan Note (Signed)
 Patient presents with a history of T2DM with a prior A1c of 6.5 in October 2025.  Patient's A1c today is 6.3.  They are on a regimen of Jardiance  10 mg and Ozempic  1mg . Patient denies adverse effects of GLP-1.  Plan: -Continue regimen of:Jardiance  10 mg and Ozempic  1mg  -A1c today -Ophthalmology referral: Placed -Foot exam: patient declined -Urine ACR, patient declined

## 2024-05-19 ENCOUNTER — Other Ambulatory Visit (HOSPITAL_COMMUNITY): Payer: Self-pay

## 2024-05-19 LAB — BASIC METABOLIC PANEL WITH GFR
BUN/Creatinine Ratio: 24 — ABNORMAL HIGH (ref 9–20)
BUN: 20 mg/dL (ref 6–24)
CO2: 21 mmol/L (ref 20–29)
Calcium: 9.1 mg/dL (ref 8.7–10.2)
Chloride: 104 mmol/L (ref 96–106)
Creatinine, Ser: 0.83 mg/dL (ref 0.76–1.27)
Glucose: 183 mg/dL — ABNORMAL HIGH (ref 70–99)
Potassium: 3.8 mmol/L (ref 3.5–5.2)
Sodium: 139 mmol/L (ref 134–144)
eGFR: 102 mL/min/1.73

## 2024-05-19 LAB — LIPID PANEL
Chol/HDL Ratio: 2.9 ratio (ref 0.0–5.0)
Cholesterol, Total: 101 mg/dL (ref 100–199)
HDL: 35 mg/dL — ABNORMAL LOW
LDL Chol Calc (NIH): 45 mg/dL (ref 0–99)
Triglycerides: 114 mg/dL (ref 0–149)
VLDL Cholesterol Cal: 21 mg/dL (ref 5–40)

## 2024-05-19 LAB — VITAMIN D 25 HYDROXY (VIT D DEFICIENCY, FRACTURES): Vit D, 25-Hydroxy: 31.8 ng/mL (ref 30.0–100.0)

## 2024-05-21 ENCOUNTER — Other Ambulatory Visit (HOSPITAL_COMMUNITY): Payer: Self-pay

## 2024-05-21 ENCOUNTER — Ambulatory Visit: Payer: Self-pay | Admitting: Student

## 2024-05-21 ENCOUNTER — Other Ambulatory Visit: Payer: Self-pay

## 2024-05-21 ENCOUNTER — Other Ambulatory Visit: Payer: Self-pay | Admitting: Radiation Oncology

## 2024-05-21 MED ORDER — OXYCODONE-ACETAMINOPHEN 5-325 MG PO TABS
1.0000 | ORAL_TABLET | Freq: Every evening | ORAL | 0 refills | Status: AC | PRN
  Filled 2024-05-21: qty 60, 42d supply, fill #0

## 2024-05-21 NOTE — Progress Notes (Signed)
 Internal Medicine Clinic Attending  Case discussed with the resident at the time of the visit.  We reviewed the residents history and exam and pertinent patient test results.  I agree with the assessment, diagnosis, and plan of care documented in the residents note.    I have high suspicion for COPD in this patient with ongoing tobacco use and emphysematous changes on imaging. Agree with plan for PFTs & initiation of LAMA & prn albuterol 

## 2024-05-22 ENCOUNTER — Encounter: Payer: Self-pay | Admitting: Internal Medicine

## 2024-05-22 ENCOUNTER — Telehealth: Payer: Self-pay

## 2024-05-22 ENCOUNTER — Ambulatory Visit: Payer: No Typology Code available for payment source | Admitting: Internal Medicine

## 2024-05-22 NOTE — Telephone Encounter (Signed)
-----   Message from [facility] sent at 05/01/2024  4:01 PM EST ----- Regarding: RE: can you call for this report? thank you!  Schwab, Kristin  [phone] ( I think this is [facility] [address] Can you request this report again? We evidently never got it. I looked in media and in care gaps I think in your message you said you called [place] location for it.   Thank you! Jonathan ----- Message ----- From: Jonathan Barker Sent: 03/14/2024  11:20 AM EST To: Jonathan Barker, RD Subject: RE: can you call fo rthis report? thank you!#  Records have been requested from the [place] with [facility] ----- Message ----- From: Barker, Jonathan HERO, RD Sent: 02/07/2024   8:45 AM EST To: Brunetta Barker Jonathan Subject: can you call fo rthis report? thank you!  [place]#   [facility] External Claim   This encounter was inferred from claims data. Visit Details  Encounter Start 12/09/2023   Encounter End 12/09/2023  -   Providers  [facility] NPI: [id number]   Facility  - NPI: - TIN: -

## 2024-05-22 NOTE — Telephone Encounter (Signed)
 E Fax Sent to [facility] [address] in [place], [place].

## 2024-05-29 ENCOUNTER — Other Ambulatory Visit

## 2024-05-31 ENCOUNTER — Inpatient Hospital Stay: Admitting: Internal Medicine

## 2024-06-01 ENCOUNTER — Encounter: Payer: Self-pay | Admitting: Student in an Organized Health Care Education/Training Program

## 2024-06-01 ENCOUNTER — Ambulatory Visit
Attending: Student in an Organized Health Care Education/Training Program | Admitting: Student in an Organized Health Care Education/Training Program

## 2024-06-01 VITALS — BP 124/68 | HR 84 | Ht 71.0 in | Wt 175.6 lb

## 2024-06-01 DIAGNOSIS — Z01812 Encounter for preprocedural laboratory examination: Secondary | ICD-10-CM

## 2024-06-01 DIAGNOSIS — I255 Ischemic cardiomyopathy: Secondary | ICD-10-CM

## 2024-06-01 DIAGNOSIS — I5022 Chronic systolic (congestive) heart failure: Secondary | ICD-10-CM

## 2024-06-01 NOTE — Progress Notes (Unsigned)
 " Cardiology Office Note   Date: 06/01/24 ID:  Jonathan Barker, DOB [birth year], MRN [id number] PCP: Leontine Lapine, MD  [facility] Providers Cardiologist:  Madonna Large, DO Electrophysiologist:  Donnice DELENA Primus, MD   History of Present Illness Jonathan Barker is a 59 y.o. male with CAD, NSTEMI s/p LAD PCI, HFrEF, aortic root dilation, HTN, HLD, DM2, tobacco use, and astrocytoma who presents for evaluation of 1' prevention ICD implant.  Discussed the use of AI scribe software for clinical note transcription with the patient, who gave verbal consent to proceed.  History of Present Illness Jonathan Barker is a 59 year old male with coronary artery disease and reduced heart function who presents for evaluation of a defibrillator placement. He is accompanied by his family.  He has a history of coronary artery disease with previous heart catheterization and stent placement due to blockages following a heart attack. His heart function is currently reduced, with an ejection fraction of 30% as per recent MRI findings, down from a previous 35%. He is on medications including spironolactone  to manage his condition.  He is concerned about the impact of a defibrillator on his lifestyle, particularly his ability to play golf. He has a history of brain cancer, which is currently stable, and undergoes yearly MRIs to monitor this condition, with the next MRI scheduled for February 18th.  He has a history of smoking and is currently trying to quit, using alternatives like Starburst and Lifesavers to manage cravings. He smokes about half a pack a day. His insurance has not covered medications to aid in smoking cessation, posing a financial barrier.  He is diabetic.  ROS: none   Studies Reviewed  ECG review 12/16/23: NSR 97, PR 196, QRS 94, QT/c 368/467, frequent PVCs 05/20/23: NSR 92, PR 202, QRS 96, QT/c 360/445 07/15/19: NSR 99, PR 164, QRS 74, QT/c 328/420 07/14/19: (23:24:31) NSR  87, QRS 94, QT/c 400/482 07/14/19: (21:33:10) NSR 82, QRS 78, QT/c 394/460 07/18/11: NSR 81, PR 182, QRS 96, QT/c 374/434  cMRI Result date: 03/16/24 1. Ischemic cardiomyopathy with left ventricular mid-apical anterior and anterolateral wall transmural infarct, without viability. LVEF 30%. Normal left ventricular chamber size. 2. Normal right ventricular chamber size with mildly reduced RV systolic function, RVEF 42%. No delayed myocardial enhancement. 3.  Mild aortic root dilation, 41 mm sinus of Valsalva.   Coronary angiography and PCI Result date: 07/15/19 LV: Mid to distal anterior, anterolateral and apical akinesis, EF 35 to at most 40%.  Hand contrast injection, not well visualized.  Normal LVEDP. RCA: Dominant, mild diffuse disease. Left main: Mildly calcified. LAD: Diffusely diseased proximal segment, mid segment had a 90% stenosis.  Mid to distal segment has minimal disease and apical LAD had 99% stenosis.  Successful PTCA and stenting of the proximal/ostial and mid LAD with implantation of a long 3.0 x 38 mm resolute Onyx DES postdilated with 3.5 x 12 mm noncompliant sapphire balloon.  90% stenosis reduced to 0% with TIMI-3 to TIMI-3 flow.  Apical LAD stenosis left alone. Ramus intermediate: Large vessel, ostium has ulcerated appearing 99% stenosis.  Upon angioplasty realized it was also calcified and probably chronic.  Successful PTCA and stenting with 2 overlapping 2.5 x 15 and distally 2.5 x 13 mm resolute Onyx DES.  9 9% stenosis reduced to 0% with TIMI-3 to TIMI-3 flow. Circumflex: Moderate to large vessel, proximal to mid segment is diffusely diseased 30%.  Large OM1 with proximal long 60% to at most 70% stenosis.  Recommendation: Patient will be started on low-dose beta-blocker for now due to poor tolerance and if blood pressure is stable will escalate the dose.  He was also started on low-dose ACE inhibitor's and high intensity high-dose atorvastatin .  He will receive aspirin   indefinitely and Brilinta  for at least 1 year in view of ACS. Patient has astrocytoma and abnormal MRI, we will have to discuss with his oncologist regarding any contraindication for antiplatelet use.  I did not think surgery would be more appropriate in the situation where high-dose heparin  had to be utilized and potential for intracranial bleeding.  Also patient had nonsustained VT just prior to angioplasty while we were reviewing the angiogram and hence the lesion was felt to be very unstable when I decided to proceed with angioplasty.  Physical Exam VS:  BP 124/68   Pulse 84   Ht 5' 11 (1.803 m)   Wt 175 lb 9.6 oz (79.7 kg)   SpO2 97%   BMI 24.49 kg/m   Wt Readings from Last 3 Encounters:  06/01/24 175 lb 9.6 oz (79.7 kg)  05/18/24 177 lb 9.6 oz (80.6 kg)  04/16/24 171 lb 9.6 oz (77.8 kg)    GEN: Well nourished, well developed in no acute distress NECK: No JVD; No carotid bruits CARDIAC: RRR, no murmurs, rubs, gallops RESPIRATORY:  Clear to auscultation without rales, wheezing or rhonchi  ABDOMEN: Soft, non-tender, non-distended EXTREMITIES:  No edema; No deformity   ASSESSMENT AND PLAN [name] is a 59 y.o. male with CAD, NSTEMI s/p LAD PCI, HFrEF, aortic root dilation, HTN, HLD, DM2, tobacco use, and astrocytoma who presents for evaluation of 1' prevention ICD implant. Assessment & Plan Ischemic cardiomyopathy with reduced ejection fraction (EF 30%) NYHA class II Significant myocardial damage with EF at 30%. MRI shows persistent scar tissue, increasing arrhythmia risk. Discussed ICD to prevent sudden cardiac death. Explained ICD function, potential shocks, rare complications, and alternative placement options. Emphasized ICD's role in risk reduction. Shared decision-making led to preference for traditional ICD placement. He has been on GDMT for >90 days without improvement in LVEF.  - Scheduled ICD placement after February 8th MRI. - Advised no heavy lifting or golfing  for one month post-procedure. - Educated on ICD function and potential need for future adjustments.  Type 2 diabetes mellitus Increases risk of infection post-ICD placement.  History of malignant neoplasm of brain Regular MRIs.  Nicotine  dependence, cigarettes Continues to smoke half a pack per day. Previous cessation attempts failed due to insurance issues. Discussed need for alternative strategies. - Encouraged continued efforts to quit smoking. - Discussed alternative cessation strategies due to insurance limitations.  Epilepsy Managed with carbamazepine.   We reviewed the risk, benefits and alternatives of ICD implant including but not limited to incisional site pain, hematoma, bleeding, damage to lung requiring drain, damage to the heart wall requiring pericardial drain and possible median sternotomy, lead perforation/dislodgment requiring revision, infection, damage to the tricuspid valve, arrhythmias, death and subsequent inappropriate ICD shocks. Patient verbalized understanding and agrees to proceed if indicated.   Pre-procedure details: Procedure date: 07/04/24 Medication instructions: Hold all medications the day of the procedure, Hold ASA for 24 hours prior, Jardiance  3 days prior and semaglutide  1 week prior.  Plan for same day discharge BSCI VVI/Cheriton ICD implant   Dispo: RTC post procedure  Signed, Donnice DELENA Primus, MD  "

## 2024-06-01 NOTE — Patient Instructions (Signed)
 Medication Instructions:  Your physician recommends that you continue on your current medications as directed. Please refer to the Current Medication list given to you today.  *If you need a refill on your cardiac medications before your next appointment, please call your pharmacy*  Lab Work: None ordered.  If you have labs (blood work) drawn today and your tests are completely normal, you will receive your results only by: MyChart Message (if you have MyChart) OR A paper copy in the mail If you have any lab test that is abnormal or we need to change your treatment, we will call you to review the results.  Testing/Procedures: 07/04/2024 Your physician has recommended that you have a defibrillator inserted. An implantable cardioverter defibrillator (ICD) is a small device that is placed in your chest or, in rare cases, your abdomen. This device uses electrical pulses or shocks to help control life-threatening, irregular heartbeats that could lead the heart to suddenly stop beating (sudden cardiac arrest). Leads are attached to the ICD that goes into your heart. This is done in the hospital and usually requires an overnight stay. Please see the instruction sheet given to you today for more information.   Follow-Up: At [facility], you and your health needs are our priority.  As part of our continuing mission to provide you with exceptional heart care, our providers are all part of one team.  This team includes your primary Cardiologist (physician) and Advanced Practice Providers or APPs (Physician Assistants and Nurse Practitioners) who all work together to provide you with the care you need, when you need it.  Your next appointment:   To be scheduled  We recommend signing up for the patient portal called MyChart.  Sign up information is provided on this After Visit Summary.  MyChart is used to connect with patients for Virtual Visits (Telemedicine).  Patients are able to view  lab/test results, encounter notes, upcoming appointments, etc.  Non-urgent messages can be sent to your provider as well.   To learn more about what you can do with MyChart, go to [email or web address].

## 2024-06-04 ENCOUNTER — Telehealth: Payer: Self-pay

## 2024-06-04 NOTE — Telephone Encounter (Signed)
-----   Message from Nurse Aldona SAUNDERS, RN sent at 06/01/2024  3:35 PM EST ----- Regarding: 3/4 ICD [place] Important: list procedure date as first item in subject line, followed by procedure type (e.g., 01/13/24 PPM implant)  Precert:  MD: [place] Type of implant: ICD Device manufacturer: [employer] Diagnosis: cardiomyopathy CPT code: ICD implant - [id number] C-code(s), including quantity (if indicated):  Procedure scheduled (date/time): 03/04 1230  Procedure:  Scrub given? No  Medication instructions: hold your ASA the day of your procedure Message sent to CVRR? No Added to calendar? Yes Orders entered? No, >30 days before procedure Letter complete? No, >30 days before procedure Scheduled with cath lab? Yes Labs ordered (CBC, BMET, PT/INR if on warfarin)? Yes Dye allergy? No Pre-meds ordered and instructions given? N/A Letter method: mail and MyChart Special instructions: n/a H&P: 01/30  Follow-up:  Cassie/Angel, please schedule Routine.  Covering RN:  Please send this message to [employer], EP scheduler, EP Scheduling pool, and EP [employer].

## 2024-06-20 ENCOUNTER — Other Ambulatory Visit

## 2024-06-21 ENCOUNTER — Inpatient Hospital Stay: Admitting: Internal Medicine

## 2024-07-04 ENCOUNTER — Encounter (HOSPITAL_COMMUNITY): Payer: Self-pay

## 2024-07-04 ENCOUNTER — Ambulatory Visit (HOSPITAL_COMMUNITY): Admit: 2024-07-04 | Admitting: Student in an Organized Health Care Education/Training Program

## 2025-02-28 ENCOUNTER — Ambulatory Visit: Admitting: Neurology
